# Patient Record
Sex: Female | Born: 1945
Health system: Southern US, Community
[De-identification: ages and names within clinical notes are randomized; demographics above are authoritative.]

## PROBLEM LIST (undated history)

## (undated) DIAGNOSIS — I2510 Atherosclerotic heart disease of native coronary artery without angina pectoris: Secondary | ICD-10-CM

## (undated) DIAGNOSIS — C50919 Malignant neoplasm of unspecified site of unspecified female breast: Secondary | ICD-10-CM

## (undated) DIAGNOSIS — I4510 Unspecified right bundle-branch block: Secondary | ICD-10-CM

## (undated) DIAGNOSIS — F32A Depression, unspecified: Secondary | ICD-10-CM

## (undated) DIAGNOSIS — Z8673 Personal history of transient ischemic attack (TIA), and cerebral infarction without residual deficits: Secondary | ICD-10-CM

## (undated) DIAGNOSIS — I452 Bifascicular block: Secondary | ICD-10-CM

## (undated) DIAGNOSIS — M1990 Unspecified osteoarthritis, unspecified site: Secondary | ICD-10-CM

## (undated) DIAGNOSIS — Z86010 Personal history of colonic polyps: Secondary | ICD-10-CM

## (undated) DIAGNOSIS — C50412 Malignant neoplasm of upper-outer quadrant of left female breast: Secondary | ICD-10-CM

## (undated) DIAGNOSIS — Z950 Presence of cardiac pacemaker: Secondary | ICD-10-CM

## (undated) DIAGNOSIS — F329 Major depressive disorder, single episode, unspecified: Secondary | ICD-10-CM

## (undated) DIAGNOSIS — E669 Obesity, unspecified: Secondary | ICD-10-CM

## (undated) DIAGNOSIS — E119 Type 2 diabetes mellitus without complications: Secondary | ICD-10-CM

## (undated) DIAGNOSIS — N3281 Overactive bladder: Secondary | ICD-10-CM

## (undated) DIAGNOSIS — K219 Gastro-esophageal reflux disease without esophagitis: Secondary | ICD-10-CM

## (undated) DIAGNOSIS — D649 Anemia, unspecified: Secondary | ICD-10-CM

## (undated) DIAGNOSIS — E875 Hyperkalemia: Secondary | ICD-10-CM

## (undated) DIAGNOSIS — Z5189 Encounter for other specified aftercare: Secondary | ICD-10-CM

## (undated) DIAGNOSIS — E1365 Other specified diabetes mellitus with hyperglycemia: Secondary | ICD-10-CM

## (undated) DIAGNOSIS — F419 Anxiety disorder, unspecified: Secondary | ICD-10-CM

## (undated) DIAGNOSIS — E1140 Type 2 diabetes mellitus with diabetic neuropathy, unspecified: Secondary | ICD-10-CM

## (undated) DIAGNOSIS — I639 Cerebral infarction, unspecified: Secondary | ICD-10-CM

## (undated) DIAGNOSIS — I10 Essential (primary) hypertension: Secondary | ICD-10-CM

## (undated) DIAGNOSIS — I453 Trifascicular block: Secondary | ICD-10-CM

## (undated) DIAGNOSIS — N200 Calculus of kidney: Secondary | ICD-10-CM

## (undated) DIAGNOSIS — T7840XA Allergy, unspecified, initial encounter: Secondary | ICD-10-CM

## (undated) DIAGNOSIS — E785 Hyperlipidemia, unspecified: Secondary | ICD-10-CM

## (undated) DIAGNOSIS — R55 Syncope and collapse: Secondary | ICD-10-CM

## (undated) HISTORY — DX: Anemia, unspecified: D64.9

## (undated) HISTORY — DX: Gastro-esophageal reflux disease without esophagitis: K21.9

## (undated) HISTORY — PX: BREAST SURGERY: SHX581

## (undated) HISTORY — DX: Syncope and collapse: R55

## (undated) HISTORY — DX: Allergy, unspecified, initial encounter: T78.40XA

## (undated) HISTORY — DX: Essential (primary) hypertension: I10

## (undated) HISTORY — PX: APPENDECTOMY: SHX54

## (undated) HISTORY — DX: Encounter for other specified aftercare: Z51.89

## (undated) HISTORY — DX: Depression, unspecified: F32.A

## (undated) HISTORY — DX: Anxiety disorder, unspecified: F41.9

## (undated) HISTORY — PX: COLONOSCOPY: SHX174

## (undated) HISTORY — PX: POLYPECTOMY: SHX149

## (undated) HISTORY — DX: Atherosclerotic heart disease of native coronary artery without angina pectoris: I25.10

## (undated) HISTORY — PX: BACK SURGERY: SHX140

## (undated) HISTORY — PX: LITHOTRIPSY: SUR834

## (undated) HISTORY — DX: Hyperkalemia: E87.5

## (undated) HISTORY — DX: Major depressive disorder, single episode, unspecified: F32.9

## (undated) HISTORY — DX: Obesity, unspecified: E66.9

## (undated) HISTORY — DX: Overactive bladder: N32.81

## (undated) HISTORY — DX: Other specified diabetes mellitus with hyperglycemia: E13.65

## (undated) HISTORY — DX: Type 2 diabetes mellitus without complications: E11.9

## (undated) HISTORY — DX: Type 2 diabetes mellitus with diabetic neuropathy, unspecified: E11.40

## (undated) HISTORY — DX: Malignant neoplasm of upper-outer quadrant of left female breast: C50.412

## (undated) HISTORY — PX: CHOLECYSTECTOMY, LAPAROSCOPIC: SHX56

## (undated) HISTORY — PX: REPLACEMENT TOTAL KNEE: SUR1224

## (undated) HISTORY — DX: Personal history of transient ischemic attack (TIA), and cerebral infarction without residual deficits: Z86.73

## (undated) HISTORY — DX: Unspecified osteoarthritis, unspecified site: M19.90

## (undated) HISTORY — PX: PARTIAL HIP ARTHROPLASTY: SHX733

## (undated) HISTORY — PX: OOPHORECTOMY: SHX86

## (undated) HISTORY — PX: JOINT REPLACEMENT: SHX530

## (undated) HISTORY — DX: Presence of cardiac pacemaker: Z95.0

## (undated) HISTORY — DX: Hyperlipidemia, unspecified: E78.5

## (undated) HISTORY — DX: Personal history of colonic polyps: Z86.010

## (undated) HISTORY — PX: TONSILLECTOMY: SUR1361

## (undated) HISTORY — PX: CHOLECYSTECTOMY: SHX55

## (undated) HISTORY — DX: Trifascicular block: I45.3

## (undated) HISTORY — DX: Unspecified right bundle-branch block: I45.10

---

## 1997-09-24 ENCOUNTER — Ambulatory Visit (HOSPITAL_BASED_OUTPATIENT_CLINIC_OR_DEPARTMENT_OTHER): Admission: RE | Admit: 1997-09-24 | Discharge: 1997-09-24 | Payer: Self-pay | Admitting: General Surgery

## 1997-11-28 ENCOUNTER — Other Ambulatory Visit: Admission: RE | Admit: 1997-11-28 | Discharge: 1997-11-28 | Payer: Self-pay | Admitting: Obstetrics & Gynecology

## 1998-02-07 ENCOUNTER — Inpatient Hospital Stay (HOSPITAL_COMMUNITY): Admission: EM | Admit: 1998-02-07 | Discharge: 1998-02-08 | Payer: Self-pay | Admitting: Emergency Medicine

## 1998-08-24 ENCOUNTER — Encounter: Payer: Self-pay | Admitting: Emergency Medicine

## 1998-08-24 ENCOUNTER — Emergency Department (HOSPITAL_COMMUNITY): Admission: EM | Admit: 1998-08-24 | Discharge: 1998-08-24 | Payer: Self-pay | Admitting: Emergency Medicine

## 1998-09-10 ENCOUNTER — Encounter: Payer: Self-pay | Admitting: Orthopedic Surgery

## 1998-09-17 ENCOUNTER — Inpatient Hospital Stay (HOSPITAL_COMMUNITY): Admission: RE | Admit: 1998-09-17 | Discharge: 1998-09-23 | Payer: Self-pay | Admitting: Orthopedic Surgery

## 1999-02-10 ENCOUNTER — Inpatient Hospital Stay (HOSPITAL_COMMUNITY): Admission: RE | Admit: 1999-02-10 | Discharge: 1999-02-16 | Payer: Self-pay | Admitting: Orthopedic Surgery

## 1999-05-14 ENCOUNTER — Emergency Department (HOSPITAL_COMMUNITY): Admission: EM | Admit: 1999-05-14 | Discharge: 1999-05-15 | Payer: Self-pay | Admitting: Internal Medicine

## 1999-09-26 ENCOUNTER — Emergency Department (HOSPITAL_COMMUNITY): Admission: EM | Admit: 1999-09-26 | Discharge: 1999-09-26 | Payer: Self-pay | Admitting: Emergency Medicine

## 2000-01-02 ENCOUNTER — Emergency Department (HOSPITAL_COMMUNITY): Admission: EM | Admit: 2000-01-02 | Discharge: 2000-01-02 | Payer: Self-pay | Admitting: Emergency Medicine

## 2000-03-04 ENCOUNTER — Inpatient Hospital Stay (HOSPITAL_COMMUNITY): Admission: EM | Admit: 2000-03-04 | Discharge: 2000-03-06 | Payer: Self-pay | Admitting: Emergency Medicine

## 2000-03-04 ENCOUNTER — Encounter: Payer: Self-pay | Admitting: Emergency Medicine

## 2000-04-17 ENCOUNTER — Inpatient Hospital Stay (HOSPITAL_COMMUNITY): Admission: EM | Admit: 2000-04-17 | Discharge: 2000-04-18 | Payer: Self-pay

## 2000-09-08 ENCOUNTER — Emergency Department (HOSPITAL_COMMUNITY): Admission: EM | Admit: 2000-09-08 | Discharge: 2000-09-08 | Payer: Self-pay | Admitting: Emergency Medicine

## 2000-11-14 ENCOUNTER — Emergency Department (HOSPITAL_COMMUNITY): Admission: EM | Admit: 2000-11-14 | Discharge: 2000-11-14 | Payer: Self-pay | Admitting: Emergency Medicine

## 2000-11-26 ENCOUNTER — Emergency Department (HOSPITAL_COMMUNITY): Admission: EM | Admit: 2000-11-26 | Discharge: 2000-11-26 | Payer: Self-pay | Admitting: *Deleted

## 2000-12-01 ENCOUNTER — Inpatient Hospital Stay (HOSPITAL_COMMUNITY): Admission: EM | Admit: 2000-12-01 | Discharge: 2000-12-04 | Payer: Self-pay | Admitting: Internal Medicine

## 2000-12-01 ENCOUNTER — Encounter: Payer: Self-pay | Admitting: Internal Medicine

## 2000-12-02 ENCOUNTER — Encounter: Payer: Self-pay | Admitting: Pulmonary Disease

## 2001-06-14 ENCOUNTER — Ambulatory Visit (HOSPITAL_COMMUNITY): Admission: RE | Admit: 2001-06-14 | Discharge: 2001-06-14 | Payer: Self-pay | Admitting: *Deleted

## 2001-06-14 ENCOUNTER — Encounter: Payer: Self-pay | Admitting: *Deleted

## 2001-12-13 ENCOUNTER — Encounter: Admission: RE | Admit: 2001-12-13 | Discharge: 2001-12-13 | Payer: Self-pay | Admitting: Urology

## 2001-12-13 ENCOUNTER — Encounter: Payer: Self-pay | Admitting: Urology

## 2001-12-20 ENCOUNTER — Ambulatory Visit (HOSPITAL_BASED_OUTPATIENT_CLINIC_OR_DEPARTMENT_OTHER): Admission: RE | Admit: 2001-12-20 | Discharge: 2001-12-20 | Payer: Self-pay | Admitting: Urology

## 2001-12-20 ENCOUNTER — Encounter (INDEPENDENT_AMBULATORY_CARE_PROVIDER_SITE_OTHER): Payer: Self-pay | Admitting: Specialist

## 2002-04-22 ENCOUNTER — Encounter: Payer: Self-pay | Admitting: Urology

## 2002-04-22 ENCOUNTER — Ambulatory Visit (HOSPITAL_BASED_OUTPATIENT_CLINIC_OR_DEPARTMENT_OTHER): Admission: RE | Admit: 2002-04-22 | Discharge: 2002-04-22 | Payer: Self-pay | Admitting: Urology

## 2002-09-26 ENCOUNTER — Ambulatory Visit (HOSPITAL_BASED_OUTPATIENT_CLINIC_OR_DEPARTMENT_OTHER): Admission: RE | Admit: 2002-09-26 | Discharge: 2002-09-26 | Payer: Self-pay | Admitting: Urology

## 2002-09-26 ENCOUNTER — Encounter: Payer: Self-pay | Admitting: Urology

## 2002-12-30 ENCOUNTER — Ambulatory Visit (HOSPITAL_COMMUNITY): Admission: RE | Admit: 2002-12-30 | Discharge: 2002-12-30 | Payer: Self-pay | Admitting: Family Medicine

## 2003-03-14 ENCOUNTER — Encounter (INDEPENDENT_AMBULATORY_CARE_PROVIDER_SITE_OTHER): Payer: Self-pay | Admitting: Family Medicine

## 2003-08-09 ENCOUNTER — Emergency Department (HOSPITAL_COMMUNITY): Admission: EM | Admit: 2003-08-09 | Discharge: 2003-08-09 | Payer: Self-pay | Admitting: Emergency Medicine

## 2003-10-02 ENCOUNTER — Inpatient Hospital Stay (HOSPITAL_COMMUNITY): Admission: EM | Admit: 2003-10-02 | Discharge: 2003-10-05 | Payer: Self-pay | Admitting: Emergency Medicine

## 2003-10-06 ENCOUNTER — Ambulatory Visit (HOSPITAL_COMMUNITY): Admission: RE | Admit: 2003-10-06 | Discharge: 2003-10-06 | Payer: Self-pay | Admitting: General Surgery

## 2003-10-06 ENCOUNTER — Encounter (INDEPENDENT_AMBULATORY_CARE_PROVIDER_SITE_OTHER): Payer: Self-pay | Admitting: Specialist

## 2003-10-06 ENCOUNTER — Ambulatory Visit (HOSPITAL_BASED_OUTPATIENT_CLINIC_OR_DEPARTMENT_OTHER): Admission: RE | Admit: 2003-10-06 | Discharge: 2003-10-06 | Payer: Self-pay | Admitting: General Surgery

## 2004-02-18 ENCOUNTER — Ambulatory Visit: Payer: Self-pay | Admitting: Family Medicine

## 2004-02-25 ENCOUNTER — Emergency Department (HOSPITAL_COMMUNITY): Admission: EM | Admit: 2004-02-25 | Discharge: 2004-02-26 | Payer: Self-pay | Admitting: Emergency Medicine

## 2004-04-16 ENCOUNTER — Ambulatory Visit: Payer: Self-pay | Admitting: *Deleted

## 2004-06-21 ENCOUNTER — Ambulatory Visit: Payer: Self-pay | Admitting: Family Medicine

## 2004-06-29 ENCOUNTER — Ambulatory Visit: Payer: Self-pay | Admitting: Family Medicine

## 2004-08-03 ENCOUNTER — Ambulatory Visit: Payer: Self-pay | Admitting: Family Medicine

## 2004-08-17 ENCOUNTER — Inpatient Hospital Stay (HOSPITAL_COMMUNITY): Admission: EM | Admit: 2004-08-17 | Discharge: 2004-08-20 | Payer: Self-pay | Admitting: Emergency Medicine

## 2004-08-18 ENCOUNTER — Ambulatory Visit: Payer: Self-pay | Admitting: *Deleted

## 2004-09-02 ENCOUNTER — Emergency Department (HOSPITAL_COMMUNITY): Admission: EM | Admit: 2004-09-02 | Discharge: 2004-09-02 | Payer: Self-pay | Admitting: Emergency Medicine

## 2004-09-03 ENCOUNTER — Ambulatory Visit: Payer: Self-pay | Admitting: Cardiology

## 2004-10-01 ENCOUNTER — Ambulatory Visit: Payer: Self-pay | Admitting: Cardiology

## 2004-10-19 ENCOUNTER — Emergency Department (HOSPITAL_COMMUNITY): Admission: EM | Admit: 2004-10-19 | Discharge: 2004-10-19 | Payer: Self-pay | Admitting: Emergency Medicine

## 2004-11-01 ENCOUNTER — Ambulatory Visit: Payer: Self-pay | Admitting: Internal Medicine

## 2004-12-09 ENCOUNTER — Emergency Department (HOSPITAL_COMMUNITY): Admission: EM | Admit: 2004-12-09 | Discharge: 2004-12-10 | Payer: Self-pay | Admitting: Emergency Medicine

## 2004-12-13 ENCOUNTER — Ambulatory Visit: Payer: Self-pay | Admitting: Family Medicine

## 2004-12-28 ENCOUNTER — Ambulatory Visit: Payer: Self-pay | Admitting: Family Medicine

## 2004-12-30 ENCOUNTER — Emergency Department (HOSPITAL_COMMUNITY): Admission: EM | Admit: 2004-12-30 | Discharge: 2004-12-30 | Payer: Self-pay | Admitting: Emergency Medicine

## 2005-01-07 ENCOUNTER — Ambulatory Visit (HOSPITAL_COMMUNITY): Admission: RE | Admit: 2005-01-07 | Discharge: 2005-01-07 | Payer: Self-pay | Admitting: Family Medicine

## 2005-01-12 ENCOUNTER — Ambulatory Visit: Payer: Self-pay | Admitting: Internal Medicine

## 2005-01-27 ENCOUNTER — Encounter: Admission: RE | Admit: 2005-01-27 | Discharge: 2005-03-06 | Payer: Self-pay | Admitting: Family Medicine

## 2005-03-07 ENCOUNTER — Encounter: Admission: RE | Admit: 2005-03-07 | Discharge: 2005-04-12 | Payer: Self-pay | Admitting: Family Medicine

## 2005-04-13 ENCOUNTER — Encounter: Admission: RE | Admit: 2005-04-13 | Discharge: 2005-07-12 | Payer: Self-pay | Admitting: Family Medicine

## 2005-04-15 ENCOUNTER — Ambulatory Visit: Payer: Self-pay | Admitting: Cardiology

## 2005-05-13 ENCOUNTER — Encounter (INDEPENDENT_AMBULATORY_CARE_PROVIDER_SITE_OTHER): Payer: Self-pay | Admitting: Family Medicine

## 2005-05-20 ENCOUNTER — Emergency Department (HOSPITAL_COMMUNITY): Admission: EM | Admit: 2005-05-20 | Discharge: 2005-05-20 | Payer: Self-pay | Admitting: Emergency Medicine

## 2005-05-21 ENCOUNTER — Inpatient Hospital Stay (HOSPITAL_COMMUNITY): Admission: EM | Admit: 2005-05-21 | Discharge: 2005-05-25 | Payer: Self-pay | Admitting: Emergency Medicine

## 2005-05-21 ENCOUNTER — Ambulatory Visit: Payer: Self-pay | Admitting: Hospitalist

## 2005-06-02 ENCOUNTER — Ambulatory Visit: Payer: Self-pay | Admitting: Family Medicine

## 2005-06-03 ENCOUNTER — Ambulatory Visit: Payer: Self-pay | Admitting: Internal Medicine

## 2005-06-09 ENCOUNTER — Encounter: Payer: Self-pay | Admitting: Family Medicine

## 2005-06-09 ENCOUNTER — Encounter: Admission: RE | Admit: 2005-06-09 | Discharge: 2005-06-09 | Payer: Self-pay | Admitting: Family Medicine

## 2005-06-23 ENCOUNTER — Ambulatory Visit (HOSPITAL_COMMUNITY): Admission: RE | Admit: 2005-06-23 | Discharge: 2005-06-23 | Payer: Self-pay | Admitting: Urology

## 2005-07-07 ENCOUNTER — Ambulatory Visit: Payer: Self-pay | Admitting: Family Medicine

## 2005-07-11 ENCOUNTER — Ambulatory Visit: Payer: Self-pay | Admitting: Family Medicine

## 2005-09-02 ENCOUNTER — Emergency Department (HOSPITAL_COMMUNITY): Admission: EM | Admit: 2005-09-02 | Discharge: 2005-09-02 | Payer: Self-pay | Admitting: Emergency Medicine

## 2005-11-03 ENCOUNTER — Ambulatory Visit: Payer: Self-pay | Admitting: Cardiology

## 2005-11-29 ENCOUNTER — Inpatient Hospital Stay (HOSPITAL_COMMUNITY): Admission: EM | Admit: 2005-11-29 | Discharge: 2005-11-30 | Payer: Self-pay | Admitting: Emergency Medicine

## 2005-11-29 ENCOUNTER — Ambulatory Visit: Payer: Self-pay | Admitting: Cardiology

## 2005-11-30 ENCOUNTER — Encounter: Payer: Self-pay | Admitting: Cardiovascular Disease

## 2005-12-01 ENCOUNTER — Emergency Department (HOSPITAL_COMMUNITY): Admission: EM | Admit: 2005-12-01 | Discharge: 2005-12-02 | Payer: Self-pay | Admitting: *Deleted

## 2006-02-28 ENCOUNTER — Ambulatory Visit (HOSPITAL_COMMUNITY): Admission: RE | Admit: 2006-02-28 | Discharge: 2006-02-28 | Payer: Self-pay | Admitting: Family Medicine

## 2006-03-27 ENCOUNTER — Ambulatory Visit: Payer: Self-pay | Admitting: Family Medicine

## 2006-05-15 ENCOUNTER — Other Ambulatory Visit: Admission: RE | Admit: 2006-05-15 | Discharge: 2006-05-15 | Payer: Self-pay | Admitting: Family Medicine

## 2006-05-15 ENCOUNTER — Encounter (INDEPENDENT_AMBULATORY_CARE_PROVIDER_SITE_OTHER): Payer: Self-pay | Admitting: Family Medicine

## 2006-05-15 ENCOUNTER — Ambulatory Visit: Payer: Self-pay | Admitting: Family Medicine

## 2006-06-07 ENCOUNTER — Ambulatory Visit: Payer: Self-pay | Admitting: Cardiology

## 2006-06-12 ENCOUNTER — Ambulatory Visit (HOSPITAL_COMMUNITY): Admission: RE | Admit: 2006-06-12 | Discharge: 2006-06-12 | Payer: Self-pay | Admitting: Family Medicine

## 2006-06-14 ENCOUNTER — Ambulatory Visit: Payer: Self-pay

## 2006-06-20 ENCOUNTER — Ambulatory Visit: Payer: Self-pay | Admitting: Cardiology

## 2006-06-20 ENCOUNTER — Observation Stay (HOSPITAL_COMMUNITY): Admission: EM | Admit: 2006-06-20 | Discharge: 2006-06-21 | Payer: Self-pay | Admitting: Emergency Medicine

## 2006-08-09 ENCOUNTER — Ambulatory Visit: Payer: Self-pay | Admitting: Family Medicine

## 2006-09-06 ENCOUNTER — Ambulatory Visit: Payer: Self-pay | Admitting: Family Medicine

## 2006-10-04 ENCOUNTER — Ambulatory Visit: Payer: Self-pay | Admitting: Family Medicine

## 2006-12-02 ENCOUNTER — Ambulatory Visit: Payer: Self-pay | Admitting: *Deleted

## 2006-12-02 ENCOUNTER — Inpatient Hospital Stay (HOSPITAL_COMMUNITY): Admission: EM | Admit: 2006-12-02 | Discharge: 2006-12-05 | Payer: Self-pay | Admitting: Emergency Medicine

## 2006-12-27 ENCOUNTER — Ambulatory Visit: Payer: Self-pay | Admitting: Family Medicine

## 2006-12-29 ENCOUNTER — Ambulatory Visit: Payer: Self-pay | Admitting: Family Medicine

## 2007-01-01 ENCOUNTER — Ambulatory Visit: Payer: Self-pay | Admitting: Family Medicine

## 2007-02-15 ENCOUNTER — Emergency Department (HOSPITAL_COMMUNITY): Admission: EM | Admit: 2007-02-15 | Discharge: 2007-02-16 | Payer: Self-pay | Admitting: Emergency Medicine

## 2007-02-22 ENCOUNTER — Encounter (INDEPENDENT_AMBULATORY_CARE_PROVIDER_SITE_OTHER): Payer: Self-pay | Admitting: Family Medicine

## 2007-02-22 DIAGNOSIS — E1149 Type 2 diabetes mellitus with other diabetic neurological complication: Secondary | ICD-10-CM

## 2007-02-22 DIAGNOSIS — E1165 Type 2 diabetes mellitus with hyperglycemia: Secondary | ICD-10-CM

## 2007-02-22 DIAGNOSIS — E785 Hyperlipidemia, unspecified: Secondary | ICD-10-CM

## 2007-09-26 ENCOUNTER — Emergency Department (HOSPITAL_COMMUNITY): Admission: EM | Admit: 2007-09-26 | Discharge: 2007-09-26 | Payer: Self-pay | Admitting: Emergency Medicine

## 2007-10-24 ENCOUNTER — Ambulatory Visit: Payer: Self-pay | Admitting: Cardiology

## 2007-11-01 ENCOUNTER — Ambulatory Visit: Payer: Self-pay | Admitting: Cardiology

## 2007-11-01 ENCOUNTER — Inpatient Hospital Stay (HOSPITAL_COMMUNITY): Admission: RE | Admit: 2007-11-01 | Discharge: 2007-11-08 | Payer: Self-pay | Admitting: Orthopedic Surgery

## 2007-11-02 ENCOUNTER — Encounter (INDEPENDENT_AMBULATORY_CARE_PROVIDER_SITE_OTHER): Payer: Self-pay | Admitting: Family Medicine

## 2007-11-16 ENCOUNTER — Ambulatory Visit (HOSPITAL_COMMUNITY): Admission: RE | Admit: 2007-11-16 | Discharge: 2007-11-16 | Payer: Self-pay | Admitting: Orthopedic Surgery

## 2007-11-16 ENCOUNTER — Encounter (INDEPENDENT_AMBULATORY_CARE_PROVIDER_SITE_OTHER): Payer: Self-pay | Admitting: Orthopedic Surgery

## 2007-11-16 ENCOUNTER — Ambulatory Visit: Payer: Self-pay | Admitting: Vascular Surgery

## 2008-08-08 ENCOUNTER — Telehealth (INDEPENDENT_AMBULATORY_CARE_PROVIDER_SITE_OTHER): Payer: Self-pay | Admitting: *Deleted

## 2008-08-12 ENCOUNTER — Encounter (INDEPENDENT_AMBULATORY_CARE_PROVIDER_SITE_OTHER): Payer: Self-pay | Admitting: Family Medicine

## 2008-09-06 ENCOUNTER — Emergency Department (HOSPITAL_COMMUNITY): Admission: EM | Admit: 2008-09-06 | Discharge: 2008-09-06 | Payer: Self-pay | Admitting: Emergency Medicine

## 2008-12-22 ENCOUNTER — Encounter: Admission: RE | Admit: 2008-12-22 | Discharge: 2008-12-22 | Payer: Self-pay | Admitting: Internal Medicine

## 2009-01-02 ENCOUNTER — Observation Stay (HOSPITAL_COMMUNITY): Admission: EM | Admit: 2009-01-02 | Discharge: 2009-01-02 | Payer: Self-pay | Admitting: Emergency Medicine

## 2009-01-11 HISTORY — PX: CARDIAC CATHETERIZATION: SHX172

## 2009-02-02 ENCOUNTER — Emergency Department (HOSPITAL_COMMUNITY): Admission: EM | Admit: 2009-02-02 | Discharge: 2009-02-02 | Payer: Self-pay | Admitting: Emergency Medicine

## 2009-02-02 ENCOUNTER — Ambulatory Visit: Payer: Self-pay | Admitting: Cardiology

## 2009-02-02 ENCOUNTER — Inpatient Hospital Stay (HOSPITAL_COMMUNITY): Admission: EM | Admit: 2009-02-02 | Discharge: 2009-02-06 | Payer: Self-pay | Admitting: Emergency Medicine

## 2009-02-15 DIAGNOSIS — K219 Gastro-esophageal reflux disease without esophagitis: Secondary | ICD-10-CM | POA: Insufficient documentation

## 2009-02-15 DIAGNOSIS — I2510 Atherosclerotic heart disease of native coronary artery without angina pectoris: Secondary | ICD-10-CM

## 2009-02-15 DIAGNOSIS — F341 Dysthymic disorder: Secondary | ICD-10-CM

## 2009-02-15 DIAGNOSIS — R079 Chest pain, unspecified: Secondary | ICD-10-CM | POA: Insufficient documentation

## 2009-02-15 DIAGNOSIS — I10 Essential (primary) hypertension: Secondary | ICD-10-CM

## 2009-02-15 DIAGNOSIS — M1990 Unspecified osteoarthritis, unspecified site: Secondary | ICD-10-CM

## 2009-02-15 DIAGNOSIS — D649 Anemia, unspecified: Secondary | ICD-10-CM

## 2009-02-15 DIAGNOSIS — E669 Obesity, unspecified: Secondary | ICD-10-CM

## 2009-02-15 HISTORY — DX: Atherosclerotic heart disease of native coronary artery without angina pectoris: I25.10

## 2009-02-15 HISTORY — DX: Essential (primary) hypertension: I10

## 2009-02-15 HISTORY — DX: Anemia, unspecified: D64.9

## 2009-02-20 ENCOUNTER — Ambulatory Visit: Payer: Self-pay | Admitting: Cardiology

## 2009-02-20 DIAGNOSIS — I4510 Unspecified right bundle-branch block: Secondary | ICD-10-CM

## 2009-08-12 ENCOUNTER — Inpatient Hospital Stay (HOSPITAL_COMMUNITY): Admission: EM | Admit: 2009-08-12 | Discharge: 2009-08-13 | Payer: Self-pay | Admitting: Emergency Medicine

## 2009-08-12 ENCOUNTER — Encounter (INDEPENDENT_AMBULATORY_CARE_PROVIDER_SITE_OTHER): Payer: Self-pay | Admitting: Internal Medicine

## 2009-08-12 ENCOUNTER — Ambulatory Visit: Payer: Self-pay | Admitting: Internal Medicine

## 2009-08-26 ENCOUNTER — Ambulatory Visit (HOSPITAL_COMMUNITY): Payer: Self-pay | Admitting: Licensed Clinical Social Worker

## 2009-09-09 ENCOUNTER — Ambulatory Visit (HOSPITAL_COMMUNITY): Payer: Self-pay | Admitting: Licensed Clinical Social Worker

## 2009-09-12 ENCOUNTER — Observation Stay (HOSPITAL_COMMUNITY): Admission: EM | Admit: 2009-09-12 | Discharge: 2009-09-12 | Payer: Self-pay | Admitting: Emergency Medicine

## 2009-09-24 ENCOUNTER — Ambulatory Visit (HOSPITAL_COMMUNITY): Payer: Self-pay | Admitting: Licensed Clinical Social Worker

## 2009-09-28 ENCOUNTER — Encounter: Admission: RE | Admit: 2009-09-28 | Discharge: 2009-09-28 | Payer: Self-pay | Admitting: Orthopedic Surgery

## 2009-10-21 ENCOUNTER — Encounter: Payer: Self-pay | Admitting: Cardiology

## 2009-10-21 ENCOUNTER — Encounter: Admission: RE | Admit: 2009-10-21 | Discharge: 2009-10-21 | Payer: Self-pay | Admitting: Specialist

## 2009-11-10 ENCOUNTER — Inpatient Hospital Stay (HOSPITAL_COMMUNITY): Admission: RE | Admit: 2009-11-10 | Discharge: 2009-11-16 | Payer: Self-pay | Admitting: Specialist

## 2009-11-13 ENCOUNTER — Ambulatory Visit: Payer: Self-pay | Admitting: Physical Medicine & Rehabilitation

## 2010-07-04 ENCOUNTER — Encounter: Payer: Self-pay | Admitting: Family Medicine

## 2010-07-13 NOTE — Consult Note (Signed)
Summary: [facility] Pre Op Consultation  [facility] Pre Op Consultation   Imported By: Roderic Ovens 11/25/2009 11:21:59  _____________________________________________________________________  External Attachment:    Type:   Image     Comment:   External Document

## 2010-08-09 ENCOUNTER — Encounter (INDEPENDENT_AMBULATORY_CARE_PROVIDER_SITE_OTHER): Payer: Self-pay | Admitting: *Deleted

## 2010-08-09 ENCOUNTER — Other Ambulatory Visit: Payer: Self-pay | Admitting: Family Medicine

## 2010-08-09 DIAGNOSIS — Z780 Asymptomatic menopausal state: Secondary | ICD-10-CM

## 2010-08-09 DIAGNOSIS — Z1231 Encounter for screening mammogram for malignant neoplasm of breast: Secondary | ICD-10-CM

## 2010-08-16 LAB — HM DIABETES EYE EXAM

## 2010-08-19 NOTE — Letter (Signed)
Summary: Pre Visit Letter Revised  [facility]  [address] [place], [place] [address]   Phone: [phone]  Fax: [phone]        08/09/2010 MRN: [id number] Kristen [name] [address] [place], [place]  [address]             Procedure Date:  09-13-10           Direct Colon--Dr. Leone Payor   Welcome to the Gastroenterology Division at [facility].    You are scheduled to see a nurse for your pre-procedure visit on 08-30-10 at 10:30a.m. on the 3rd floor at [facility], [address].  We ask that you try to arrive at our office 15 minutes prior to your appointment time to allow for check-in.  Please take a minute to review the attached form.  If you answer "Yes" to one or more of the questions on the first page, we ask that you call the person listed at your earliest opportunity.  If you answer "No" to all of the questions, please complete the rest of the form and bring it to your appointment.    Your nurse visit will consist of discussing your medical and surgical history, your immediate family medical history, and your medications.   If you are unable to list all of your medications on the form, please bring the medication bottles to your appointment and we will list them.  We will need to be aware of both prescribed and over the counter drugs.  We will need to know exact dosage information as well.    Please be prepared to read and sign documents such as consent forms, a financial agreement, and acknowledgement forms.  If necessary, and with your consent, a friend or relative is welcome to sit-in on the nurse visit with you.  Please bring your insurance card so that we may make a copy of it.  If your insurance requires a referral to see a specialist, please bring your referral form from your primary care physician.  No co-pay is required for this nurse visit.     If you cannot keep your appointment, please call [phone] to cancel or reschedule  prior to your appointment date.  This allows [place] the opportunity to schedule an appointment for another patient in need of care.    Thank you for choosing [facility] for your medical needs.  We appreciate the opportunity to care for you.  Please visit [place] at our website  to learn more about our practice.  Sincerely, The Gastroenterology Division

## 2010-08-20 ENCOUNTER — Other Ambulatory Visit: Payer: Self-pay

## 2010-08-20 ENCOUNTER — Ambulatory Visit: Payer: Self-pay

## 2010-08-30 ENCOUNTER — Encounter: Payer: Self-pay | Admitting: Internal Medicine

## 2010-08-30 LAB — DIFFERENTIAL
Basophils Absolute: 0 10*3/uL (ref 0.0–0.1)
Basophils Relative: 0 % (ref 0–1)
Eosinophils Absolute: 0.2 10*3/uL (ref 0.0–0.7)
Eosinophils Relative: 2 % (ref 0–5)
Monocytes Absolute: 1 10*3/uL (ref 0.1–1.0)
Neutro Abs: 4.1 10*3/uL (ref 1.7–7.7)

## 2010-08-30 LAB — GLUCOSE, CAPILLARY
Glucose-Capillary: 103 mg/dL — ABNORMAL HIGH (ref 70–99)
Glucose-Capillary: 115 mg/dL — ABNORMAL HIGH (ref 70–99)
Glucose-Capillary: 116 mg/dL — ABNORMAL HIGH (ref 70–99)
Glucose-Capillary: 120 mg/dL — ABNORMAL HIGH (ref 70–99)
Glucose-Capillary: 132 mg/dL — ABNORMAL HIGH (ref 70–99)
Glucose-Capillary: 144 mg/dL — ABNORMAL HIGH (ref 70–99)
Glucose-Capillary: 150 mg/dL — ABNORMAL HIGH (ref 70–99)
Glucose-Capillary: 171 mg/dL — ABNORMAL HIGH (ref 70–99)
Glucose-Capillary: 77 mg/dL (ref 70–99)
Glucose-Capillary: 79 mg/dL (ref 70–99)
Glucose-Capillary: 91 mg/dL (ref 70–99)
Glucose-Capillary: 135 mg/dL — ABNORMAL HIGH (ref 70–99)
Glucose-Capillary: 137 mg/dL — ABNORMAL HIGH (ref 70–99)

## 2010-08-30 LAB — POCT I-STAT 4, (NA,K, GLUC, HGB,HCT)
Glucose, Bld: 177 mg/dL — ABNORMAL HIGH (ref 70–99)
HCT: 30 % — ABNORMAL LOW (ref 36.0–46.0)
Potassium: 4.9 mEq/L (ref 3.5–5.1)
Sodium: 139 mEq/L (ref 135–145)

## 2010-08-30 LAB — CBC
HCT: 36.4 % (ref 36.0–46.0)
Hemoglobin: 12.1 g/dL (ref 12.0–15.0)
Platelets: 239 10*3/uL (ref 150–400)
WBC: 8.1 10*3/uL (ref 4.0–10.5)

## 2010-08-30 LAB — TYPE AND SCREEN

## 2010-08-30 LAB — BASIC METABOLIC PANEL
BUN: 10 mg/dL (ref 6–23)
BUN: 22 mg/dL (ref 6–23)
Calcium: 7.3 mg/dL — ABNORMAL LOW (ref 8.4–10.5)
Creatinine, Ser: 0.85 mg/dL (ref 0.4–1.2)
GFR calc Af Amer: >60 mL/min (ref >60)
GFR calc non Af Amer: 34 mL/min — ABNORMAL LOW (ref >60)
GFR calc non Af Amer: >60 mL/min (ref >60)
Potassium: 4.7 mEq/L (ref 3.5–5.1)
Potassium: 5.1 mEq/L (ref 3.5–5.1)
Sodium: 137 mEq/L (ref 135–145)

## 2010-08-30 LAB — URINALYSIS, ROUTINE W REFLEX MICROSCOPIC
Bilirubin Urine: NEGATIVE
Glucose, UA: NEGATIVE mg/dL
Hgb urine dipstick: NEGATIVE
Ketones, ur: NEGATIVE mg/dL
Protein, ur: NEGATIVE mg/dL

## 2010-08-30 LAB — HEMOGLOBIN AND HEMATOCRIT, BLOOD
HCT: 29.6 % — ABNORMAL LOW (ref 36.0–46.0)
HCT: 30.7 % — ABNORMAL LOW (ref 36.0–46.0)
Hemoglobin: 10 g/dL — ABNORMAL LOW (ref 12.0–15.0)

## 2010-08-30 LAB — HEMOGLOBIN A1C

## 2010-08-30 LAB — COMPREHENSIVE METABOLIC PANEL
Albumin: 4 g/dL (ref 3.5–5.2)
Alkaline Phosphatase: 64 U/L (ref 39–117)
BUN: 15 mg/dL (ref 6–23)
Chloride: 102 mEq/L (ref 96–112)
Potassium: 5.1 mEq/L (ref 3.5–5.1)
Total Bilirubin: 0.4 mg/dL (ref 0.3–1.2)

## 2010-08-30 LAB — POCT I-STAT GLUCOSE: Glucose, Bld: 148 mg/dL — ABNORMAL HIGH (ref 70–99)

## 2010-09-01 ENCOUNTER — Ambulatory Visit
Admission: RE | Admit: 2010-09-01 | Discharge: 2010-09-01 | Disposition: A | Discharge status: Home / Self Care | Payer: Medicare Other | Source: Ambulatory Visit | Attending: Family Medicine | Admitting: Family Medicine

## 2010-09-01 DIAGNOSIS — Z780 Asymptomatic menopausal state: Secondary | ICD-10-CM

## 2010-09-01 DIAGNOSIS — Z1231 Encounter for screening mammogram for malignant neoplasm of breast: Secondary | ICD-10-CM

## 2010-09-01 LAB — POCT CARDIAC MARKERS
CKMB, poc: 1.2 ng/mL (ref 1.0–8.0)
CKMB, poc: <1 ng/mL — ABNORMAL LOW (ref 1.0–8.0)
CKMB, poc: <1 ng/mL — ABNORMAL LOW (ref 1.0–8.0)
Troponin i, poc: <0.05 ng/mL (ref 0.00–0.09)
Troponin i, poc: <0.05 ng/mL (ref 0.00–0.09)
Troponin i, poc: <0.05 ng/mL (ref 0.00–0.09)

## 2010-09-01 LAB — DIFFERENTIAL
Lymphocytes Relative: 35 % (ref 12–46)
Lymphs Abs: 2.4 10*3/uL (ref 0.7–4.0)
Monocytes Absolute: 0.7 10*3/uL (ref 0.1–1.0)
Monocytes Relative: 10 % (ref 3–12)
Neutro Abs: 3.5 10*3/uL (ref 1.7–7.7)
Neutrophils Relative %: 52 % (ref 43–77)

## 2010-09-01 LAB — POCT I-STAT, CHEM 8
BUN: 21 mg/dL (ref 6–23)
Calcium, Ion: 1.21 mmol/L (ref 1.12–1.32)
Chloride: 106 mEq/L (ref 96–112)
Glucose, Bld: 148 mg/dL — ABNORMAL HIGH (ref 70–99)
HCT: 37 % (ref 36.0–46.0)
Potassium: 5.1 mEq/L (ref 3.5–5.1)

## 2010-09-01 LAB — CBC
Hemoglobin: 11.6 g/dL — ABNORMAL LOW (ref 12.0–15.0)
RBC: 3.89 MIL/uL (ref 3.87–5.11)
WBC: 6.7 10*3/uL (ref 4.0–10.5)

## 2010-09-01 LAB — HM DEXA SCAN

## 2010-09-03 LAB — CBC
HCT: 35.6 % — ABNORMAL LOW (ref 36.0–46.0)
HCT: 37.1 % (ref 36.0–46.0)
MCHC: 32.1 g/dL (ref 30.0–36.0)
MCHC: 33.1 g/dL (ref 30.0–36.0)
MCV: 90.1 fL (ref 78.0–100.0)
MCV: 90.3 fL (ref 78.0–100.0)
MCV: 90.8 fL (ref 78.0–100.0)
Platelets: 196 10*3/uL (ref 150–400)
Platelets: 236 10*3/uL (ref 150–400)
RBC: 3.64 MIL/uL — ABNORMAL LOW (ref 3.87–5.11)
RDW: 14.9 % (ref 11.5–15.5)
RDW: 14.9 % (ref 11.5–15.5)

## 2010-09-03 LAB — URINE CULTURE: Colony Count: 2000

## 2010-09-03 LAB — URINALYSIS, ROUTINE W REFLEX MICROSCOPIC
Bilirubin Urine: NEGATIVE
Nitrite: NEGATIVE
Specific Gravity, Urine: 1.011 (ref 1.005–1.030)
Urobilinogen, UA: 0.2 mg/dL (ref 0.0–1.0)
pH: 6 (ref 5.0–8.0)

## 2010-09-03 LAB — COMPREHENSIVE METABOLIC PANEL
ALT: 25 U/L (ref 0–35)
Albumin: 3.5 g/dL (ref 3.5–5.2)
Alkaline Phosphatase: 71 U/L (ref 39–117)
BUN: 11 mg/dL (ref 6–23)
CO2: 28 mEq/L (ref 19–32)
Calcium: 9.5 mg/dL (ref 8.4–10.5)
Chloride: 104 mEq/L (ref 96–112)
Creatinine, Ser: 0.84 mg/dL (ref 0.4–1.2)
GFR calc Af Amer: >60 mL/min (ref >60)
GFR calc Af Amer: >60 mL/min (ref >60)
GFR calc non Af Amer: >60 mL/min (ref >60)
Glucose, Bld: 156 mg/dL — ABNORMAL HIGH (ref 70–99)
Total Protein: 7 g/dL (ref 6.0–8.3)

## 2010-09-03 LAB — CARDIAC PANEL(CRET KIN+CKTOT+MB+TROPI)
CK, MB: 1.2 ng/mL (ref 0.3–4.0)
CK, MB: 1.3 ng/mL (ref 0.3–4.0)
Relative Index: INVALID (ref 0.0–2.5)
Relative Index: INVALID (ref 0.0–2.5)
Total CK: 49 U/L (ref 7–177)
Troponin I: 0.01 ng/mL (ref 0.00–0.06)
Troponin I: 0.03 ng/mL (ref 0.00–0.06)

## 2010-09-03 LAB — D-DIMER, QUANTITATIVE: D-Dimer, Quant: <0.22 ug/mL-FEU (ref 0.00–0.48)

## 2010-09-03 LAB — GLUCOSE, CAPILLARY
Glucose-Capillary: 118 mg/dL — ABNORMAL HIGH (ref 70–99)
Glucose-Capillary: 130 mg/dL — ABNORMAL HIGH (ref 70–99)
Glucose-Capillary: 153 mg/dL — ABNORMAL HIGH (ref 70–99)

## 2010-09-03 LAB — DIFFERENTIAL
Basophils Relative: 0 % (ref 0–1)
Eosinophils Absolute: 0.2 10*3/uL (ref 0.0–0.7)
Eosinophils Relative: 2 % (ref 0–5)
Neutrophils Relative %: 59 % (ref 43–77)

## 2010-09-03 LAB — POTASSIUM: Potassium: 4.7 mEq/L (ref 3.5–5.1)

## 2010-09-03 LAB — TROPONIN I: Troponin I: 0.01 ng/mL (ref 0.00–0.06)

## 2010-09-03 LAB — BASIC METABOLIC PANEL
BUN: 16 mg/dL (ref 6–23)
CO2: 27 mEq/L (ref 19–32)
Chloride: 105 mEq/L (ref 96–112)
Creatinine, Ser: 1.06 mg/dL (ref 0.4–1.2)
GFR calc Af Amer: >60 mL/min (ref >60)
Glucose, Bld: 140 mg/dL — ABNORMAL HIGH (ref 70–99)

## 2010-09-03 LAB — POCT CARDIAC MARKERS
CKMB, poc: <1 ng/mL — ABNORMAL LOW (ref 1.0–8.0)
Troponin i, poc: <0.05 ng/mL (ref 0.00–0.09)

## 2010-09-03 LAB — TSH: TSH: 1.309 u[IU]/mL (ref 0.350–4.500)

## 2010-09-03 LAB — CK TOTAL AND CKMB (NOT AT ARMC)
CK, MB: 1.3 ng/mL (ref 0.3–4.0)
Relative Index: INVALID (ref 0.0–2.5)

## 2010-09-03 LAB — PROTIME-INR: Prothrombin Time: 13.9 seconds (ref 11.6–15.2)

## 2010-09-09 NOTE — Miscellaneous (Signed)
Summary: [facility]  Clinical Lists Changes  Medications: Added new medication of MOVIPREP 100 GM  SOLR (PEG-KCL-NACL-NASULF-NA ASC-C) As per prep instructions. - Signed Rx of MOVIPREP 100 GM  SOLR (PEG-KCL-NACL-NASULF-NA ASC-C) As per prep instructions.;  #1 x 0;  Signed;  Entered by: Doristine Church RN II;  Authorized by: Iva Boop MD, [facility];  Method used: Electronically to [facility] [phone]*, [address], [place], [place]  [address], Ph: [phone], Fax: [phone] Observations: Added new observation of ALLERGY REV: Done (08/30/2010 10:19)    Prescriptions: MOVIPREP 100 GM  SOLR (PEG-KCL-NACL-NASULF-NA ASC-C) As per prep instructions.  #1 x 0   Entered by:   Doristine Church RN II   Authorized by:   Iva Boop MD, [facility]   Signed by:   Doristine Church RN II on 08/30/2010   Method used:   Electronically to        [facility] [phone]* (retail)       [address]       [place], [place]  [address]       Ph: [phone]       Fax: [phone]   RxID:   [id number]

## 2010-09-09 NOTE — Letter (Signed)
Summary: Diabetic Instructions  [facility]  [address] [place], [place] [address]   Phone: [phone]  Fax: [phone]    Kristen [name] 11/07/1945 MRN: [id number]   _  _   ORAL DIABETIC MEDICATION INSTRUCTIONS  The day before your procedure:   Take your diabetic pill as you do normally  The day of your procedure:   Do not take your diabetic pill    We will check your blood sugar levels during the admission process and again in Recovery before discharging you home  ________________________________________________________________________  _  _   INSULIN (LONG ACTING) MEDICATION INSTRUCTIONS (Lantus, NPH, 70/30, Humulin, Novolin-N)   The day before your procedure:   Take  your regular evening dose    The day of your procedure:   Do not take your morning dose

## 2010-09-09 NOTE — Letter (Signed)
Summary: [facility] Instructions  [facility]  [address] [place], [place] [address]   Phone: [phone]  Fax: [phone]       ADRIELLE POLAKOWSKI    May 19, 1946    MRN: [id number]        Procedure Day Dorna Bloom:  Duanne Limerick  09/13/10     Arrival Time:  10:30AM     Procedure Time:  11:30AM     Location of Procedure:                    _X _  [facility] (4th Floor)                      PREPARATION FOR COLONOSCOPY WITH MOVIPREP   Starting 5 days prior to your procedure 09/08/10 do not eat nuts, seeds, popcorn, corn, beans, peas,  salads, or any raw vegetables.  Do not take any fiber supplements (e.g. Metamucil, Citrucel, and Benefiber).  THE DAY BEFORE YOUR PROCEDURE         DATE: 09/12/10  DAY: SUNDAY  1.  Drink clear liquids the entire day-NO SOLID FOOD  2.  Do not drink anything colored red or purple.  Avoid juices with pulp.  No orange juice.  3.  Drink at least 64 oz. (8 glasses) of fluid/clear liquids during the day to prevent dehydration and help the prep work efficiently.  CLEAR LIQUIDS INCLUDE: Water Jello Ice Popsicles Tea (sugar ok, no milk/cream) Powdered fruit flavored drinks Coffee (sugar ok, no milk/cream) Gatorade Juice: apple, white grape, white cranberry  Lemonade Clear bullion, consomm, broth Carbonated beverages (any kind) Strained chicken noodle soup Hard Candy                             4.  In the morning, mix first dose of MoviPrep solution:    Empty 1 Pouch A and 1 Pouch B into the disposable container    Add lukewarm drinking water to the top line of the container. Mix to dissolve    Refrigerate (mixed solution should be used within 24 hrs)  5.  Begin drinking the prep at 5:00 p.m. The MoviPrep container is divided by 4 marks.   Every 15 minutes drink the solution down to the next mark (approximately 8 oz) until the full liter is complete.   6.  Follow completed prep with 16 oz of clear liquid of your choice (Nothing red  or purple).  Continue to drink clear liquids until bedtime.  7.  Before going to bed, mix second dose of MoviPrep solution:    Empty 1 Pouch A and 1 Pouch B into the disposable container    Add lukewarm drinking water to the top line of the container. Mix to dissolve    Refrigerate  THE DAY OF YOUR PROCEDURE      DATE: 09/13/10  DAY: MONDAY  Beginning at 6:30AM (5 hours before procedure):         1. Every 15 minutes, drink the solution down to the next mark (approx 8 oz) until the full liter is complete.  2. Follow completed prep with 16 oz. of clear liquid of your choice.    3. You may drink clear liquids until 9:30AM (2 HOURS BEFORE PROCEDURE).   MEDICATION INSTRUCTIONS  Unless otherwise instructed, you should take regular prescription medications with a small sip of water   as early as possible the morning of your  procedure.  Diabetic patients - see separate instructions.    Additional medication instructions:  No iron for 5 days         OTHER INSTRUCTIONS  You will need a responsible adult at least 66 years of age to accompany you and drive you home.   This person must remain in the waiting room during your procedure.  Wear loose fitting clothing that is easily removed.  Leave jewelry and other valuables at home.  However, you may wish to bring a book to read or  an iPod/MP3 player to listen to music as you wait for your procedure to start.  Remove all body piercing jewelry and leave at home.  Total time from sign-in until discharge is approximately 2-3 hours.  You should go home directly after your procedure and rest.  You can resume normal activities the  day after your procedure.  The day of your procedure you should not:   Drive   Make legal decisions   Operate machinery   Drink alcohol   Return to work  You will receive specific instructions about eating, activities and medications before you leave.    The above instructions have been  reviewed and explained to me by   Doristine Church RN II  August 30, 2010 10:47 AM _______________________    I fully understand and can verbalize these instructions _____________________________ Date _________

## 2010-09-13 ENCOUNTER — Ambulatory Visit (AMBULATORY_SURGERY_CENTER): Payer: Self-pay | Admitting: Internal Medicine

## 2010-09-13 ENCOUNTER — Encounter: Payer: Self-pay | Admitting: Internal Medicine

## 2010-09-13 VITALS — BP 142/75 | HR 66 | Resp 16 | Ht 64.0 in | Wt 210.0 lb

## 2010-09-13 DIAGNOSIS — D126 Benign neoplasm of colon, unspecified: Secondary | ICD-10-CM

## 2010-09-13 DIAGNOSIS — Z1211 Encounter for screening for malignant neoplasm of colon: Secondary | ICD-10-CM

## 2010-09-13 DIAGNOSIS — K648 Other hemorrhoids: Secondary | ICD-10-CM

## 2010-09-13 LAB — HM COLONOSCOPY

## 2010-09-13 LAB — GLUCOSE, CAPILLARY
Glucose-Capillary: 164 mg/dL — ABNORMAL HIGH (ref 70–99)
Glucose-Capillary: 138 mg/dL — ABNORMAL HIGH (ref 70–99)

## 2010-09-13 NOTE — Patient Instructions (Signed)
Read all handouts from discharge;  If you have any questions, please call [phone].   It's been a pleasure taking care of you.

## 2010-09-14 ENCOUNTER — Telehealth: Payer: Self-pay | Admitting: *Deleted

## 2010-09-14 NOTE — Telephone Encounter (Signed)

## 2010-09-17 ENCOUNTER — Encounter: Payer: Self-pay | Admitting: Internal Medicine

## 2010-09-17 NOTE — Progress Notes (Signed)
Quick Note:  Colon recall 5 yrs 09/2015 2 diminutive adenomas  ______

## 2010-09-18 LAB — DIFFERENTIAL
Basophils Absolute: 0 10*3/uL (ref 0.0–0.1)
Lymphocytes Relative: 38 % (ref 12–46)
Neutro Abs: 3.3 10*3/uL (ref 1.7–7.7)

## 2010-09-18 LAB — CBC
HCT: 33.2 % — ABNORMAL LOW (ref 36.0–46.0)
HCT: 34 % — ABNORMAL LOW (ref 36.0–46.0)
MCV: 87.8 fL (ref 78.0–100.0)
Platelets: 196 10*3/uL (ref 150–400)
Platelets: 203 10*3/uL (ref 150–400)
Platelets: 216 10*3/uL (ref 150–400)
RDW: 16.3 % — ABNORMAL HIGH (ref 11.5–15.5)
RDW: 16.4 % — ABNORMAL HIGH (ref 11.5–15.5)
RDW: 16.7 % — ABNORMAL HIGH (ref 11.5–15.5)
WBC: 7.6 10*3/uL (ref 4.0–10.5)
WBC: 8 10*3/uL (ref 4.0–10.5)

## 2010-09-18 LAB — GLUCOSE, CAPILLARY
Glucose-Capillary: 112 mg/dL — ABNORMAL HIGH (ref 70–99)
Glucose-Capillary: 113 mg/dL — ABNORMAL HIGH (ref 70–99)
Glucose-Capillary: 126 mg/dL — ABNORMAL HIGH (ref 70–99)
Glucose-Capillary: 128 mg/dL — ABNORMAL HIGH (ref 70–99)
Glucose-Capillary: 130 mg/dL — ABNORMAL HIGH (ref 70–99)
Glucose-Capillary: 138 mg/dL — ABNORMAL HIGH (ref 70–99)
Glucose-Capillary: 158 mg/dL — ABNORMAL HIGH (ref 70–99)
Glucose-Capillary: 167 mg/dL — ABNORMAL HIGH (ref 70–99)
Glucose-Capillary: 87 mg/dL (ref 70–99)

## 2010-09-18 LAB — HEPATIC FUNCTION PANEL
Albumin: 3.5 g/dL (ref 3.5–5.2)
Total Protein: 7.3 g/dL (ref 6.0–8.3)

## 2010-09-18 LAB — BASIC METABOLIC PANEL
BUN: 12 mg/dL (ref 6–23)
BUN: 13 mg/dL (ref 6–23)
Calcium: 9 mg/dL (ref 8.4–10.5)
Chloride: 101 mEq/L (ref 96–112)
Chloride: 103 mEq/L (ref 96–112)
Creatinine, Ser: 0.87 mg/dL (ref 0.4–1.2)
Creatinine, Ser: 0.95 mg/dL (ref 0.4–1.2)
GFR calc non Af Amer: 50 mL/min — ABNORMAL LOW (ref >60)
GFR calc non Af Amer: 59 mL/min — ABNORMAL LOW (ref >60)
Glucose, Bld: 126 mg/dL — ABNORMAL HIGH (ref 70–99)
Glucose, Bld: 140 mg/dL — ABNORMAL HIGH (ref 70–99)
Glucose, Bld: 152 mg/dL — ABNORMAL HIGH (ref 70–99)
Potassium: 4 mEq/L (ref 3.5–5.1)
Potassium: 4.6 mEq/L (ref 3.5–5.1)
Sodium: 141 mEq/L (ref 135–145)

## 2010-09-18 LAB — APTT: aPTT: 34 seconds (ref 24–37)

## 2010-09-18 LAB — CARDIAC PANEL(CRET KIN+CKTOT+MB+TROPI)
CK, MB: 1.4 ng/mL (ref 0.3–4.0)
Total CK: 44 U/L (ref 7–177)
Troponin I: 0.02 ng/mL (ref 0.00–0.06)

## 2010-09-18 LAB — POCT I-STAT, CHEM 8
BUN: 15 mg/dL (ref 6–23)
Chloride: 106 mEq/L (ref 96–112)
Sodium: 138 mEq/L (ref 135–145)
TCO2: 24 mmol/L (ref 0–100)

## 2010-09-18 LAB — PROTIME-INR
INR: 1.1 (ref 0.00–1.49)
Prothrombin Time: 14 seconds (ref 11.6–15.2)

## 2010-09-18 LAB — TROPONIN I: Troponin I: 0.01 ng/mL (ref 0.00–0.06)

## 2010-09-18 LAB — POCT CARDIAC MARKERS: Myoglobin, poc: 36.8 ng/mL (ref 12–200)

## 2010-09-18 LAB — HEPARIN LEVEL (UNFRACTIONATED)
Heparin Unfractionated: 0.59 IU/mL (ref 0.30–0.70)
Heparin Unfractionated: 0.77 IU/mL — ABNORMAL HIGH (ref 0.30–0.70)

## 2010-09-18 LAB — D-DIMER, QUANTITATIVE: D-Dimer, Quant: <0.22 ug/mL-FEU (ref 0.00–0.48)

## 2010-09-19 LAB — POCT CARDIAC MARKERS
CKMB, poc: 1.1 ng/mL (ref 1.0–8.0)
Myoglobin, poc: 38.4 ng/mL (ref 12–200)
Myoglobin, poc: 59.2 ng/mL (ref 12–200)

## 2010-09-19 LAB — GLUCOSE, CAPILLARY
Glucose-Capillary: 102 mg/dL — ABNORMAL HIGH (ref 70–99)
Glucose-Capillary: 85 mg/dL (ref 70–99)
Glucose-Capillary: 93 mg/dL (ref 70–99)

## 2010-09-19 LAB — BASIC METABOLIC PANEL
BUN: 17 mg/dL (ref 6–23)
Calcium: 8.6 mg/dL (ref 8.4–10.5)
GFR calc non Af Amer: >60 mL/min (ref >60)
Glucose, Bld: 95 mg/dL (ref 70–99)
Sodium: 139 mEq/L (ref 135–145)

## 2010-09-19 LAB — DIFFERENTIAL
Basophils Absolute: 0 10*3/uL (ref 0.0–0.1)
Lymphocytes Relative: 43 % (ref 12–46)
Neutro Abs: 3.3 10*3/uL (ref 1.7–7.7)

## 2010-09-19 LAB — CBC
Platelets: 254 10*3/uL (ref 150–400)
RDW: 16.3 % — ABNORMAL HIGH (ref 11.5–15.5)

## 2010-09-23 LAB — POCT I-STAT, CHEM 8
BUN: 19 mg/dL (ref 6–23)
Chloride: 102 mEq/L (ref 96–112)
Creatinine, Ser: 0.8 mg/dL (ref 0.4–1.2)
Glucose, Bld: 323 mg/dL — ABNORMAL HIGH (ref 70–99)
Potassium: 5 mEq/L (ref 3.5–5.1)

## 2010-09-23 LAB — DIFFERENTIAL
Eosinophils Absolute: 0.1 10*3/uL (ref 0.0–0.7)
Eosinophils Relative: 1 % (ref 0–5)
Lymphs Abs: 0.5 10*3/uL — ABNORMAL LOW (ref 0.7–4.0)

## 2010-09-23 LAB — URINALYSIS, ROUTINE W REFLEX MICROSCOPIC
Bilirubin Urine: NEGATIVE
Glucose, UA: >1000 mg/dL — AB
Hgb urine dipstick: NEGATIVE
Ketones, ur: 15 mg/dL — AB
Leukocytes, UA: NEGATIVE
Protein, ur: 100 mg/dL — AB

## 2010-09-23 LAB — CBC
HCT: 40.9 % (ref 36.0–46.0)
MCV: 86.9 fL (ref 78.0–100.0)
Platelets: 195 10*3/uL (ref 150–400)
RDW: 16.3 % — ABNORMAL HIGH (ref 11.5–15.5)
WBC: 10.6 10*3/uL — ABNORMAL HIGH (ref 4.0–10.5)

## 2010-10-26 NOTE — Discharge Summary (Signed)
NAME:  Kristen [name], RHEIN NO.:  [id number]   MEDICAL RECORD NO.:  [id number]          PATIENT TYPE:  INP   LOCATION:  1426                         FACILITY:  [facility]   PHYSICIAN:  Hettie Holstein, D.O.    DATE OF BIRTH:  09-09-1945   DATE OF ADMISSION:  11/01/2007  DATE OF DISCHARGE:                               DISCHARGE SUMMARY   ADDENDUM:  This is in review of the medications.  The medications are  reconciled and I agree with these upon discharge.  #12 is blank on the  preliminary transcription.  That medication is actually Pepcid 20 mg  p.o. b.i.d.  Her Lantus at this time is being held due to the  hypoglycemic event, she is typically on 15 units of subcu nightly.  She  could revisit this in the outpatient setting.  Her hemoglobin A1c is  6.8.  For now due to the evidence of hypoglycemia we are recommending  only metformin at 1 gram twice daily and holding all of her other  therapies with NovoLog sliding scale, sensitive, with a CBGs to occur  q.a.c. and at bedtime while in the rehab setting.  Otherwise, her  lisinopril is, as well, being held due to hyperkalemia and renal  insufficiency on presentation.  Additional antihypertensive agents may  be required, though at present, she is normotensive.  Would recommend  following up her H&H and transfusing as needed.  She received 2 units  PRBC here.  Otherwise, would continue medications as outlined by Dr.  Sable Feil service. The patient is medically stable to transition to a  rehab setting.  Foley catheter can be discontinued.      Hettie Holstein, D.O.  Electronically Signed     ESS/MEDQ  D:  11/08/2007  T:  11/08/2007  Job:  [id number]

## 2010-10-26 NOTE — Assessment & Plan Note (Signed)
[facility]                            CARDIOLOGY OFFICE NOTE   NAME:Kristen [name], Kristen [name]                      MRN:          [id number]  DATE:10/24/2007                            DOB:          Apr 28, 1946    Kristen [name] comes in today for her preoperative clearance for left hip  replacement.  She has degenerative disease, and this is scheduled in the  near future with Dr. Hadassah Pais.   She has a history of nonobstructive coronary disease.  She is having no  angina or any symptoms of ischemia.  She denies any orthopnea, PND, or  peripheral edema.  Her gait has been much steadier and she is avoiding  falls.  She is living in a skilled nursing facility and comes with her  caretaker today.   Her last stress Myoview was June 14, 2006 which showed an ejection  fraction of 71% with only minimal anterior wall ischemia versus shifting  breast attenuation.  It was felt to be a low-risk scan.  She had normal  contractility and thickening in all areas of the myocardium.   She denies any orthopnea, PND or peripheral edema.  She has had no  palpitations, syncope, or presyncope.  She has no significant valve  disease.   CURRENT MEDICATIONS:  1. Alprazolam 0.5 mg a day.  2. Aspirin 81 mg a day.  3. Detrol 4 mg a day.  4. Glimepiride 4 mg a day.  5. Famotidine 20 mg p.o. b.i.d.  6. Lisinopril 10 mg a day.  7. Vytorin 10/40 daily.  8. Isosorbide 30 mg a day.  9. Lantus.  10.Fluoxetine 40 mg daily.  11.Metformin 1000 mg p.o. b.i.d.   Her blood pressure today is 110/70, pulse is 78 and regular, weight is  226.  HEENT:  Normocephalic, atraumatic.  PERRLA, extraocular movements  intact, sclerae clear, facial symmetry is normal.  She is alert and oriented x3.  She is very pleasant.  Carotids are full, no bruits.  Thyroid is not enlarged, trachea is  midline.  Lungs are clear.  Heart reveals a nondisplaced PMI, shows normal S1, S2 without gallop.  ABDOMINAL  EXAM:  Obese with good bowel sounds.  Organomegaly cannot be  assessed.  EXTREMITIES:  Reveal no cyanosis, clubbing, or edema.  Pulses are  intact.  NEURO EXAM:  Intact.   EKG shows sinus rhythm with a right bundle branch block which is old, as  well as a left axis deviation which is old.   ASSESSMENT AND PLAN:  Kristen [name] is stable from my standpoint.  I have  cleared her for surgery.  We would like to see her during her  hospitalization.     Thomas C. Daleen Squibb, MD, [facility]  Electronically Signed    TCW/MedQ  DD: 10/24/2007  DT: 10/24/2007  Job #: [id number]   cc:   Molly Maduro A. Thurston Hole, M.D.

## 2010-10-26 NOTE — Cardiovascular Report (Signed)
NAME:  Kristen [name], BAHNER NO.:  [id number]   MEDICAL RECORD NO.:  [id number]          PATIENT TYPE:  INP   LOCATION:  3705                         FACILITY:  [facility]   PHYSICIAN:  Arturo Morton. Riley Kill, MD, FACCDATE OF BIRTH:  1945/11/23   DATE OF PROCEDURE:  12/04/2006  DATE OF DISCHARGE:                            CARDIAC CATHETERIZATION   INDICATIONS:  This patient has a long history of chest discomfort.  She  has undergone previous cardiac catheterization.  Minor irregularities  have been noted.  The current study was done because of recurrent chest  pain requiring Vicodin.  Procedural risks were discussed and the patient  was willing to proceed.   PROCEDURES:  1. Left heart catheterization.  2. Selective coronary arteriography.  3. Selective left ventriculography.   DESCRIPTION OF PROCEDURE:  The patient was brought to the  catheterization lab and prepped and draped in the usual fashion through  an anterior puncture of the right femoral artery was entered.  A 5-  French sheath was placed.  Views of left and right coronaries were  obtained in multiple angiographic projections.  Central aortic and left  ventricular pressures were measured with pigtail.  Ventriculography was  performed in the RAO projection.  There were no complications.  She was  taken to the holding area in satisfactory clinical condition for direct  manual hemostasis.   HEMODYNAMIC DATA:  1. Central aortic pressure was 147/86, mean 111.  2. Left ventricular pressure is 103/14.  3. There was no gradient and pullback across the aortic valve.   ANGIOGRAPHIC DATA:  1. The left main coronary artery was free of critical disease.  2. Left anterior descending artery coursed to the apex.  There were      two major diagonal branches.  No significant areas of high-grade      obstruction were noted.  3. There was a ramus intermedius and it bifurcates distally and is      without significant focal  narrowing.  4. The circumflex proper has about 30% eccentric plaquing in its      proximal portion.  The vessel terminates as a marginal branch and      has several submarginal branches.  It is without critical      narrowing.  5. The right coronary artery provides posterior descending and      posterolateral branch and is free of critical disease.  6. Ventriculography in the RAO projection reveals preserved global      systolic function.  No segmental abnormalities or contractions are      identified.   CONCLUSIONS:  1. Preserved left ventricular function.  2. No areas of high-grade critical coronary obstruction.   DISPOSITION:  The patient will be treated medically.  Continued followup  with Dr. Daleen Squibb.      Arturo Morton. Riley Kill, MD, [facility]  Electronically Signed     TDS/MEDQ  D:  12/04/2006  T:  12/05/2006  Job:  [id number]   cc:   [facility]

## 2010-10-26 NOTE — H&P (Signed)
NAME:  Kristen [name], Kristen [name] NO.:  [id number]   MEDICAL RECORD NO.:  [id number]          PATIENT TYPE:  INP   LOCATION:  1823                         FACILITY:  [facility]   PHYSICIAN:  Rod Holler, MD     DATE OF BIRTH:  02-May-1946   DATE OF ADMISSION:  12/01/2006  DATE OF DISCHARGE:                              HISTORY & PHYSICAL   CARDIOLOGIST:  Maisie Fus C. Wall, MD, [facility].   CHIEF COMPLAINT:  Chest pain.   HISTORY OF PRESENT ILLNESS:  Kristen [name] is a 65 year old female with  history of hypertension, hyperlipidemia, diabetes mellitus type 2,  chronic chest discomfort who presents to the emergency department with  complaints of chest pain.  Today at about 4 p.m., while at rest, the  patient had onset of sharp left-sided chest discomfort and pressure with  radiation to her left upper extremity. This was associated with  shortness of breath and lightheadedness.  She also noticed that her  glucoses were in the 300s at this time.  She went and lie down and the  chest discomfort improved after a couple minutes.  The last time known  episode of chest discomfort that she had was in January.  There was no  associated palpitations, no syncope or presyncope.  She has had no  recent heart failure symptoms to include PND, orthopnea, lower extremity  edema, or dyspnea on exertion.  At present, the chest discomfort is much  improved.  Of note, the patient has a cardiac catheterization in March  2006 with no obstructive coronary disease.  The patient had an adenosine  Myoview in January 2008 which was read as very mild anterior ischemia,  possible breast attenuation, low risk study.   PAST MEDICAL HISTORY:  1. Diabetes mellitus type 2.  2. GERD.  3. Hyperlipidemia.  4. Depression.  5. Migraines.  6. Degenerative disk disease.  7. History of anemia.  8. Nephrolithiasis.  9. Chronic chest pain.  10.Cardiac catheterization in March 2006 with 20% second diagonal, 20%      RCA.  Adenosine Myoview in January 2008 was very mild anterior      ischemia, possible breast attenuation.  11.Hypertension.   MEDICATIONS:  1. Vytorin 10/40 one tablet p.o. at bedtime.  2. Pepcid 20 mg p.o. b.i.d.  3. Fluoxetine 40 mg p.o. daily.  4. Metformin 1000 mg p.o. b.i.d.  5. Motrin p.r.n.  6. Isordil 30 mg p.o. daily.  7. Meclizine 25 mg p.o. q.8h. p.r.n.  8. Lortab 5/500 q.4 h. P.r.n.  9. Detrol LA 4 mg p.o. daily.  10.Aspirin 81 mg p.o. daily.  11.Xanax 0.5 mg p.o. at bedtime.  12.Lisinopril 10 mg p.o. daily.  13.Glimepiride 4 mg p.o. daily.   ALLERGIES:  SULFA.   SOCIAL HISTORY:  The patient is a nonsmoker, nondrinker, lives at home  with family members.   FAMILY HISTORY:  Mother died at age 47 of unknown causes.  Father died  at age 35 with myocardial infarction.   REVIEW OF SYSTEMS:  All systems reviewed and are negative except as in  history of present illness.  EKG:  Sinus rhythm, right bundle branch  block, no anterior T wave changes.   PHYSICAL EXAMINATION:  VITAL SIGNS:  Temperature 98.5, blood pressure  127/72, heart rate 85, respiratory rate 16.  Oxygen saturation 96%.  GENERAL:  Obese female.  Alert and oriented x3.  In no apparent  distress.  HEENT:  Atraumatic, normocephalic.  Pupils are equal, round and reactive  to light.  Extraocular movements intact.  NECK:  Supple.  No adenopathy.  No JVD, no carotid bruits.  CHEST:  Lungs clear to auscultation bilaterally with equal, bilateral  breath sounds.  HEART:  Regular rate and rhythm.  Normal S1 and S2.  No murmur, rub, or  gallops.  ABDOMEN:  Soft, nontender, nondistended.  Active bowel sounds.  EXTREMITIES:  No clubbing, cyanosis or edema.  NEUROLOGIC:  No focal deficits.   LABORATORY DATA:  White blood cell count 8.7, hematocrit 7.4, platelet  count 268.  Sodium 140, potassium 4.4, chloride 106, bicarb 26, BUN 15,  creatinine 1, glucose 168.  Troponin less than 0.05.  CK-MB 1.1.  Chest  x-ray  pending.   IMPRESSION AND PLAN:  A 65 year old female with a cardiac  catheterization in 2006 with no obstructive coronary disease, low risk  adenosine Myoview in January 2008, who presented with sharp left-sided  chest discomfort.  1. Admit the patient to telemetry.  Rule out serial cardiac enzymes.      Continue cardiovascular medicines to include aspirin, ACE      inhibitor, daily EKGs.  Pulmonary followup with chest x-ray.  2. Endocrine/insulin.  3. Fluids, electrolytes, and nutrition.  Diabetic diet.  4. GI:  H2 blocker.  5. Prophylaxis: DVT prophylaxis with subcutaneous heparin.      Rod Holler, MD  Electronically Signed     TRK/MEDQ  D:  12/02/2006  T:  12/02/2006  Job:  [id number]

## 2010-10-26 NOTE — Op Note (Signed)
NAME:  Kristen [name], CARBERRY NO.:  [id number]   MEDICAL RECORD NO.:  [id number]          PATIENT TYPE:  INP   LOCATION:  0004                         FACILITY:  [facility]   PHYSICIAN:  John L. Rendall, M.D.  DATE OF BIRTH:  Apr 03, 1946   DATE OF PROCEDURE:  11/01/2007  DATE OF DISCHARGE:                               OPERATIVE REPORT   PREOPERATIVE DIAGNOSIS:  Osteoarthritis, left hip.   POSTOPERATIVE DIAGNOSIS:  Osteoarthritis, left hip.   SURGICAL PROCEDURES:  Left AML total hip replacement.   SURGEON:  Rendall.   ASSISTANT:  Duffy, PA-C.   ANESTHESIA:  Epidural.   PROCEDURE:  Under epidural anesthesia the left hip was prepared with  DuraPrep and draped as a sterile field in the right lateral decubitus  position.  The posterior approach was marked out and an approximate 7-  inch incision was made.  Multiple small vessels were cauterized.  The  Charnley retractor was inserted.  With internal rotation of the hip, the  short external rotators and hip capsule were taken down from bone and  the hip was dislocated.  The superior femoral neck was exposed.  The IM  initiator and canal finders were used.  The canal was then reamed  progressively to 13 mm, which had excellent chatter fit.  The femoral  neck was then osteotomized about a fingerbreadth above the lesser  trochanter.  Rasping was then done with the 10.05 narrow, 12 and 13.5  narrow.  Calcar reaming was done after the 12.5.  With the femoral side  completed, attention was turned to the acetabular side.  Two cobra  retractors were placed inferiorly and 2 wing retractors superiorly.  The  labrum was excised.  The edge of the acetabulum was debrided with a Cobb  elevator.  The ligamentum teres was removed.  Bleeding was controlled  with a center of the acetabulum, then reaming was done progressively 43,  45, 47, 49, 50 and this seemed to give a fairly good circumferential  fit.  Trial seating of a 50 bottomed out  nicely and reaming was then  done with a 51 and a 52-100 series Pinnacle cup was inserted with  excellent fit, alignment, and stability.  The Sputnik guide was used to  assist in placement.  Trial seating then of a acetabular liner plus 4-mm  10-degree angle, 52-mm outside and 36-mm inside diameter, was tried and  the rasp and +1.5, 36-mm hip ball are all used.  This gave excellent  fit, alignment, stability with leg lengths that appeared virtually  equal.  At this point permanent components were obtained.  The marathon  acetabular liner was inserted plus four 10-degrees angle 36-mm inside 52  outside and apex hole eliminator was used.  The AML small stature 13.5  stem was then used and this was impacted until it would not go any  further.  It was about 4 mm from seating on the calcar.  To make sure  that appropriate neck length on the ball was used, a trial ball was used  first and the hip was stable through full range  of motion; internal  rotation, full flexion, full extension.  To get full extension, there  was a requirement of slight anterior capsular release from the femur,  and once this was done, it was readily possible.  At this point the  permanent hip ball was placed and impacted.  The hip was reduced.  The capsule and piriformis were sutured superiorly.  The IT band was  then closed with #1 Tycron, the subcu with 2-0 Quill, and the skin with  2-0 Vicryl and skin clips.  The patient tolerated the procedure well and  returned to recovery in good condition.      John L. Rendall, M.D.  Electronically Signed     JLR/MEDQ  D:  11/01/2007  T:  11/01/2007  Job:  [id number]

## 2010-10-26 NOTE — Consult Note (Signed)
NAME:  [name], Kristen NO.:  [id number]   MEDICAL RECORD NO.:  [id number]          PATIENT TYPE:  INP   LOCATION:  1426                         FACILITY:  [facility]   PHYSICIAN:  Madolyn Frieze. Jens Som, MD, FACCDATE OF BIRTH:  1945-07-17   DATE OF CONSULTATION:  11/01/2007  DATE OF DISCHARGE:                                 CONSULTATION   REQUESTING PHYSICIAN:  Dr. Fredric Mare.   PRIMARY CARDIOLOGIST:  Jesse Sans. Wall, MD, FACC   REASON FOR CONSULTATION:  Postop cardiology management in the patient  with recent left hip replacement.   HISTORY OF PRESENT ILLNESS:  This is a 65 year old Caucasian female with  known history of nonobstructive CAD, diabetes, hyperlipidemia,  hypertension, and chronic angina, who has had a left hip replacement  today per Dr. Fredric Mare.  We are asked to follow postoperatively to monitor  from a cardiology standpoint.  The patient has been asymptomatic from a  cardiac standpoint with no complaints of chest pain, shortness of  breath, nausea, vomiting, or diaphoresis.  The patient has chronic  degenerative disk disease and left hip pain, which elicited surgery.  Currently, the patient is being seen in PACU.  She is still a little  groggy, but is responsive and has no cardiac complaints at this time.  EKG was completed in PACU revealing a normal sinus rhythm with left axis  deviation, right bundle-branch block, and some mild left ventricular  hypertrophy, which is unchanged from prior EKG.   REVIEW OF SYSTEMS:  Positive for chronic arthralgia with left hip pain,  mild dyspnea on exertion in the past, and chronic angina in the past,  currently none.  She denies any fevers, chills, diaphoresis, nausea, or  vomiting at this time.  Otherwise, systems were reviewed and found to be  negative.   PAST MEDICAL HISTORY:  1. Nonobstructive CAD.  A:  Status post cardiac catheterization in June 2008 revealing 30%  eccentric plaquing in the circumflex proper,  otherwise right coronary  artery had no critical disease, LAD no critical disease, left main was  free of critical disease.  LVEF was 70% at that time.  1. Diabetes mellitus type 2.  2. Hyperlipidemia.  3. Hypertension.  4. Depression.  5. Migraine.  6. Anemia.  7. Degenerative disk disease.   PAST SURGICAL HISTORY:  The patient had a recent left hip replacement on  the day of this consultation.  Please see prior H&P for further details  on prior history.   SOCIAL HISTORY:  The patient lives in a skilled nursing facility in  [place].  She is widowed.  She does not smoke and does not drink  alcohol.   FAMILY HISTORY:  Mother died of unknown causes at age 1.  Father died  of MI at age 60.  Siblings are unknown at this time as the patient is  mildly sedated postoperatively.   CURRENT MEDICATIONS:  Robaxin IV, Ancef IV, fentanyl IV, insulin per  sliding scale, and Zofran p.r.n.   MEDICATIONS PRIOR TO ADMISSION:  1. Alprazolam 0.5 mg daily.  2. Aspirin 81 mg daily.  3. Zestril  4 mg daily.  4. Glimepiride 4 mg a day.  5. Famotidine 20 mg b.i.d.  6. Lisinopril 10 mg daily.  7. Vytorin 10/40 daily.  8. Isosorbide 30 mg daily.  9. Lantus insulin.  10.Fluoxetine 20 mg daily.  11.Metformin 1000 mg p.o. b.i.d.   ALLERGIES:  SULFA.   CURRENT LABS:  Hemoglobin 9.8 and hematocrit 29.2 (status post 1 unit of  packed red blood cells).  Sodium 137, potassium 4.9, chloride 103, CO2  of 26, BUN 12, creatinine 0.95, and glucose 204.  EKG revealing normal  sinus rhythm, rate of 83 beats per minute with right bundle-branch block  and left ventricular hypertrophy.   PHYSICAL EXAMINATION:  VITAL SIGNS:  Blood pressure 95/53, pulse 86,  respirations 26, and O2 sat 95% via O2 mask.  GENERAL:  She is groggy postoperatively, but responsive appropriately.  HEENT:  Head is normocephalic and atraumatic.  Eyes, PERRLA.  Mucous  membranes and mouth moist.  Tongue is midline.  NECK:  Soft.   There is no JVD.  No carotid bruits appreciated.  CARDIOVASCULAR:  Regular rate and rhythm without murmurs, rubs, or  gallops.  Pulses are 2+ and equal without bruits.  LUNGS:  Clear to auscultation laterally and anteriorly.  Poor  respiratory effort is noticed.  ABDOMEN:  Obese and nontender with 2+ bowel sounds.  EXTREMITIES:  Without clubbing, cyanosis, or edema.  She has  postoperative left hip incision and bilateral SCD devices on.  NEURO:  Cranial nerves II through XII are grossly intact.  She is  complaining of repetitive pain on the left hip pain and she is mildly  diminished secondary to sedation postoperatively.   IMPRESSION:  1. Status post left hip replacement.  2. History of nonobstructive coronary artery disease.  3. Chronic angina, asymptomatic at this time.  4. Diabetes mellitus type 2.  5. Mild anxiety.   PLAN:  This is a 65 year old Caucasian female whom we were asked to  follow postoperatively after left hip replacement secondary to  degenerative disk disease with history of nonobstructive coronary artery  disease and chronic angina.  The patient is seen and examined by myself  and Dr. Olga Millers in PACU.  The patient's cath on December 04, 2006,  revealed normal LV function and no obstructive CAD.  Myoview previously  prior to cath with questionable anterior ischemia versus breast  attenuation.  The patient has not had any complaints of chest pain or  shortness of breath.  Her main complaint now is left hip pain  postoperatively.  EKG reveals normal sinus rhythm  with right bundle-branch block and LVH with no ST-T wave changes.  The  patient is mildly hypotensive postoperatively.  We will continue aspirin  and statin postoperatively.  We will hold lisinopril until her blood  pressure improves and follows making further recommendations if  necessary during hospital course.      Bettey Mare. Lyman Bishop, NP      Madolyn Frieze. Jens Som, MD, [facility]  Electronically  Signed    KML/MEDQ  D:  11/01/2007  T:  11/02/2007  Job:  [id number]   cc:   Dr. Wyline Mood

## 2010-10-26 NOTE — H&P (Signed)
NAME:  Kristen [name] NO.:  [id number]   MEDICAL RECORD NO.:  [id number]          PATIENT TYPE:  INP   LOCATION:  2021                         FACILITY:  [facility]   PHYSICIAN:  Kristen Sans. Wall, MD, FACCDATE OF BIRTH:  1946/01/18   DATE OF ADMISSION:  02/02/2009  DATE OF DISCHARGE:                              HISTORY & PHYSICAL   PRIMARY CARDIOLOGIST:  Kristen Fus C. Wall, MD, Kristen [name]   CHIEF COMPLAINTS:  Chest pain.   HISTORY OF PRESENT ILLNESS:  Kristen [name] is a 65 year old Caucasian  female with known nonobstructive coronary artery disease, last cath in  June 2008 that revealed above, as well as normal LV function.  Risk  factors for CAD include hypertension, hyperlipidemia, obesity/sedentary  lifestyle, also CAD equivalent of type 2 diabetes mellitus.  Kristen patient  also has a history significant for chronic chest pain, GERD,  depression/anxiety.  Kristen patient now presents with her typical anginal  symptoms.  She was in her usual state of health up until being awakened  this morning with sharp, moderate in severity, left-sided chest pain  with radiation up into Kristen left shoulder and down her left arm with  associated nausea, tachy palpitations, and mild shortness of breath.  Symptoms waxed and waned, lasting approximately 10-15 minutes and then  resolving on their own for a few minutes and then returning.  Kristen  patient went to Kristen urgent care today and was given 2 sublingual  nitroglycerin with some relief and then IV morphine 2 mg x1 with further  relief.  She was sent to [name] ED for further eval where she was  given 2 more sublingual nitroglycerin and morphine 2 mg IV push x1  again.  Her symptoms did improve.  She continued to have mild nausea and  was given Zofran 4 mg IV, and she was started on Nitropaste 1 inch.  Upon arrival, she was normotensive with heart rate within normal limits.  EKG showed no acute changes, and chest x-ray no acute cardiopulmonary  disease.  First set of point-of-care markers is negative.  Kristen rest of  her labs are unremarkable.  Note, Kristen patient is currently complaining  of minimal symptoms.   PAST MEDICAL HISTORY:  1. CAD, nonobstructive per last cath, June 2008.  2. Diabetes mellitus, type 2.  3. Hypertension.  4. Hyperlipidemia.  5. Chronic chest pain.  6. Anemia.  7. GERD.  8. Osteoarthritis, S/P left total hip arthroplasty.  9. Depression/anxiety.  10.Insomnia.  11.Obesity/sedentary lifestyle.   SOCIAL HISTORY:  Kristen [place] with her daughter and  her son-in-law.  She has been on disability for several years.  She has  no significant smoking, EtOH, illicit drug use, or herbal medication  use.  She follows an ADA diet and is active around Kristen house but does no  regular exercise.   FAMILY HISTORY:  Negative for premature diagnosis of CAD.  Mother  deceased at age 30 of cancer.  Father deceased at age 99 from an MI (his  first).  Three brothers deceased from cancer.  One brother living and  one sister living, none with any history of CAD.   REVIEW OF SYSTEMS:  Kristen patient has intermittent chronic headaches, as  well as HPI symptoms.  All other systems reviewed and were negative.   CODE STATUS:  Full.   ALLERGIES:  SULFA (hives).   MEDICATIONS:  Kristen patient has received sublingual nitroglycerin x4 over  Kristen course of Kristen morning, as well as 2 IV doses of morphine at 2 mg  each.  Zofran 4 mg IV x1.  Currently has 1-inch Nitropaste on.  Home  medications include:  1. Alprazolam 0.5 mg p.o. daily.  2. Low-dose enteric-coated aspirin daily.  3. Detrol 4 mg p.o. daily.  4. Actos ?dose daily.  5. Metformin 1 g daily.  6. Lantus 46 units subcu injection nightly.  7. Regular insulin 10 units just prior to each meal.  8. Lisinopril 10 mg p.o. daily.  9. Vytorin 10/40 p.o. daily.  10.Isosorbide 30 mg p.o. daily.  11.Fluoxetine 40 mg p.o. daily.   PHYSICAL EXAMINATION:  VITAL  SIGNS:  Temperature 98.6 degrees  Fahrenheit, BP 105/68, pulse 58, respiratory rate 11, O2 saturation 97%  on 2 liters by nasal cannula.  GENERAL:  Kristen patient is alert, oriented x3, in no apparent stress, able  to speak in full sentences without respiratory distress.  HEENT:  Head normocephalic, atraumatic.  Pupils equal, round, and  reactive to light.  Extraocular muscles are intact.  Nares are patent  without discharge.  Kristen patient wears upper and lower dentures.  Oropharynx without erythema or exudates.  NECK:  Supple without lymphadenopathy or thyromegaly.  No JVD.  No  bruits.  HEART:  Rate is regular with audible S1 and S2.  No clicks, rubs, or  gallops but 1/6 soft murmur.  Pulses 2+ equal in both upper and lower  extremities bilaterally.  LUNGS:  Clear to auscultation bilaterally.  SKIN:  No rash, lesions, or petechiae.  ABDOMEN:  Soft, nontender, nondistended.  Normal abdominal bowel sounds.  No rebound or guarding.  No hepatosplenomegaly.  Kristen patient is morbidly  obese.  EXTREMITIES:  No clubbing, cyanosis, or edema.  MUSCULOSKELETAL:  No joint deformity or effusions.  No spinal or CVA  tenderness.  NEUROLOGIC:  Cranial nerves II through XII were grossly intact.  Strength 5/5 in all extremities and axial groups.  Normal sensation  throughout, and normal cerebellar function.   RADIOLOGY:  1. Chest x-ray shows no acute cardiopulmonary disease.  2. EKG shows first-degree AV block with right bundle as well as left      anterior fascicular block and nonspecific ST-T wave changes, rate      61, no Q waves, left ventricular hypertrophy.  PR 232, QRS 140, QTC      447.   LABORATORY DATA:  WBC 6.8, hgb 11.9, HCT 35.8, PLT count 216.  Sodium  138, potassium 4.9, chloride 106, BUN 15, creatinine 0.6, glucose 102.  Point-of-care markers negative x1.   ASSESSMENT AND PLAN:  Kristen [name] is a 65 year old Caucasian female with  nonobstructive coronary artery disease, as well as  above history  including chronic angina and 4 cardiac catheterizations without evidence  of obstructive disease pairing with her typical chest pain.  Given Kristen  patient's concern and Kristen acute onset of her symptoms, we will admit to  telemetry and rule out with cardiac enzymes.  We will continue her home  medications with an increase of her Imdur from 30 to 60 mg p.o. q.a.m.,  and if cardiac enzymes  are negative x3 not including her first set of  point-of-care markers, which were already negative, we will discharge  tomorrow with early followup for outpatient Myoview.  Otherwise, if her  enzymes are positive, cardiac cath appointment.  Kristen patient's primary  cardiologist, Dr. Daleen Squibb, is  aware of this assessment and plan.  He has  seen and assessed Kristen patient himself and agrees.      Jarrett Ables, PAC      Thomas C. Daleen Squibb, MD, [name]  Electronically Signed    MS/MEDQ  D:  02/02/2009  T:  02/03/2009  Job:  [id number]

## 2010-10-26 NOTE — Discharge Summary (Signed)
NAME:  LAVINE, HARGROVE               ACCOUNT NO.:  [id number]   MEDICAL RECORD NO.:  [id number]          PATIENT TYPE:  INP   LOCATION:  1426                         FACILITY:  [facility]   PHYSICIAN:  John L. Rendall, M.D.  DATE OF BIRTH:  [birth year]   DATE OF ADMISSION:  11/01/2007  DATE OF DISCHARGE:  11/06/2007                               DISCHARGE SUMMARY   ADMISSION DIAGNOSIS:  Osteoarthritis of the left hip.   DISCHARGE DIAGNOSES:  1. Osteoarthritis of the left hip.  2. Coronary artery disease status post coronary artery bypass graft.  3. Diabetes mellitus.  4. Hyperlipidemia.  5. Hypertension.  6. Anemia.  7. Gastroesophageal reflux disease.  8. Depression/anxiety.  9. Chronic angina.  10.Hyperkalemia.  11.Acute blood loss anemia.  12.Insomnia.   PROCEDURE:  Left total hip arthroplasty.   HISTORY:  This 65 year old white female presents with a 28-month history  of sudden onset of progressive left hip pain.  She denies any history of  injury or trauma or surgery.  She thought she was having a problem with  her left total knee initially.  The pain was constant and throbbing and  some form of sharpness to it.  It was mainly in the left buttock and  groin with radiation into the knee.  It got worsens with walking and  decreases with lying down.  She does have some catching and popping and  locking and inability to sleep secondary to the left hip pain.  She has  difficulty with her socks and shoes.  Denies any back pain or  paresthesias.  She has been using Vicodin and ibuprofen with only  minimal relief.  Indicated at this time for total hip arthroplasty.   HOSPITAL COURSE:  A 65 year old white female admitted Nov 02, 2007 after  appropriate laboratory studies were obtained and 2 grams of Ancef IV on  call to the operating room.  She was taken to the operating room where  she underwent a left total hip arthroplasty.  She tolerated the  procedure well.  She was continued on  Ancef 1 gram IV q.6h. for three  doses.  She was started on Arixtra 2.5 mg subcu at 8 a.m. for 10 days  starting on Nov 02, 2007.  Consultations to PT, OT and care management  were made.  She may be ambulating weightbearing as tolerated with a  walker.  Foley was placed intraoperatively.  A urine was repeated once  the Foley was inserted.  She was seen by the hospitalists for  evaluation.  They started her on enteric-coated aspirin 81 mg daily and  Vytorin 10/40.  There were some difficulties with her, and it was felt  that she should be placed on telemetry.  Apparently, potassium level on  Nov 02, 2007 was 5.2.  Repeat was 4.9.  On Nov 02, 2007, Dr. Daleen Squibb who  was consulted was asked to decide about continued monitoring.  Dr. Darnelle Catalan  discontinued her Prinivil and took her Lantus to 25 units subcu daily.  Hemoglobin A1c was ordered.  Telemetry was discontinued on Nov 02, 2007.  Later that  day, the Lantus was changed to 15 units subcu nightly.  It  was felt that she was able to be transferred to the floor.  There was an  order for type and cross and given 1 unit of blood, but this was halted  by Dr. Darene Lamer on Nov 02, 2007.  The next morning the patient was  transfused 2 units of packed cells by Dr. Valentino Hue orders.  Imdur was  discontinued on Nov 03, 2007.  The Lantus was then shifted back up to 25  units subcu daily.  The SSI was changed to insulin-resistant scale.  On  Nov 03, 2007, dressing was changed, showing the wound to be stable.  On  Nov 04, 2007, Lantus was increased to 30 units subcu daily, and 6 units  of NovoLog was added for meal coverage.  On Nov 05, 2007, it was felt  that her hemoglobin had dropped below 8, and she was typed and crossed  and was to get 1 unit of blood.  A repeat CBC revealed either some type  of lab error because the hemoglobin was 8.5.  The blood was then held.  She has had a rocky course with her glucoses.  On Nov 06, 2007, she had  the Lantus decreased to 20  units at bedtime.  She was started on Nu-Iron  150 mg b.i.d. and MiraLax 17 grams in 8 ounces of liquid x1 and then  daily p.r.n.  This was at 9 o'clock in the morning.  At 1402, the Lantus  was discontinued and the Amaryl was discontinued by Dr. Darnelle Catalan.  The IVs  were to be taken to saline lock if okay with Dr. Darnelle Catalan.  The PCA was  discontinued.  At this point, the remainder of her hospital course has  been uneventful otherwise.  Plan is for discharged on Nov 07, 2007  unless change in the patient's status.   EKG of Nov 01, 2007 showed normal sinus rhythm with left axis deviation  a right bundle branch block.  There was left ventricular hypertrophy  with QRS widening.  On September 26, 2007 revealed normal sinus rhythm, left  axis deviation, right bundle branch block.  There were no radiographic  studies on the chart at the time of this dictation.   LABORATORY DATA:  She was admitted with hemoglobin of 11.6, hematocrit  35.8%, white count 6400, platelets 229,000.  Discharge hemoglobin 8.0,  hematocrit 23.8%, white count 6700, platelets 159,000.  Blood gas of Nov 01, 2007 revealed pH of 7.395, PCO2 37.5, PO2 61, bicarb 23, PCO2 24,  base excess 2.0, O2 saturation 91.0%, and it was noted that it was a  venous sample according to the lab studies.  Preoperative sodium 137,  potassium 4.9, chloride 103, CO2 26, glucose 204, BUN 12, creatinine  0.96, bilirubin 0.6.  Alk phos 56, GOT 26, GPT 31, total protein 7.1,  albumin 3.6, calcium 9.1.  Discharge sodium 142, potassium 3.6, chloride  107, CO2 26, glucose 55, BUN 8, creatinine 0.81.  GFR greater than 60.  Bilirubin 0.9, alk phos 44, GOT 60, GPT 17, total protein 5.2, albumin  2.1, calcium 7.8.  Urinalysis revealed a few squamous, 3-6 whites, 0-2  reds, many bacteria.  Urine culture of Nov 01, 2007 revealed no growth.  Of Oct 29, 2007, it showed greater than 1000 colonies of multiple  bacterial morphotypes.   DISCHARGE INSTRUCTIONS:  She is to  be on a diabetic diet, a 60 gram  modified carb  diet.  Increase activity slowly.  Use her walker,  weightbearing as tolerated on the left.  No lifting or driving for 6  weeks.  She may shower after no drainage from the wound for 2 days.  Follow the blue total hip instruction sheet.  Genevieve Norlander for her home  health when she is discharged from SNF.   DISCHARGE MEDICATIONS:  1. Arixtra 2.5 mg, injecting 2.5 subcu at 8:00 a.m., last dose Nov 11, 2007.  2. At that time, begin 81 mg of aspirin on November 12, 2007.  3. Percocet 5/325 1-2 tabs every four hours as needed for pain.  4. Nu-Iron 150 mg p.o. b.i.d.  5. MiraLax 17 grams in 8 ounces daily p.r.n. constipation.  6. Detrol LA 4 mg daily.  7. Fluoxetine 40 mg daily.  8. Metformin 1000 mg b.i.d.  9. Vytorin 10/40 nightly.  10.Alprazolam 0.5 mg nightly.  11.Colace 100 mg daily.  12.__________ 20 mg b.i.d.  13.Ambien 10 mg p.o. nightly p.r.n.  14.Nitroglycerin sublingual 0.4 mg 1 tablet under the tongue every 5      minutes as needed for chest pain x3 and then call physician.   FOLLOW UP:  She will need followup with her family doctor for continued  care of glucoses.  She should be on CBGs q.i.d.  She will follow up with  Dr. Priscille Kluver on Tuesday, November 13, 2007 and will need to call for  appointment at [phone].   CONDITION ON DISCHARGE:  Improved condition.      Oris Drone Petrarca, P.A.-C.      Carlisle Beers. Rendall, M.D.  Electronically Signed    BDP/MEDQ  D:  11/06/2007  T:  11/06/2007  Job:  [id number]

## 2010-10-26 NOTE — Discharge Summary (Signed)
NAME:  Kristen [name], Kristen [name] NO.:  [id number]   MEDICAL RECORD NO.:  [id number]          PATIENT TYPE:  INP   LOCATION:  3705                         FACILITY:  [facility]   PHYSICIAN:  Rollene Rotunda, MD, FACCDATE OF BIRTH:  February 04, 1946   DATE OF ADMISSION:  12/01/2006  DATE OF DISCHARGE:                         DISCHARGE SUMMARY - REFERRING   DISCHARGE DIAGNOSES:  1. Chest discomfort of uncertain etiology.  2. Minimal plaquing without obstructive coronary artery disease and      normal left ventricular function by cardiac catheterization.  3. Normocytic anemia.  4. Hyperglycemia with poorly controlled diabetes as evidenced by      elevated hemoglobin A1c.  5. History as noted below.   PROCEDURES PERFORMED:  Cardiac catheterization December 04, 2006, by Dr. Bonnee Quin.   SUMMARY OF HISTORY:  Kristen [name] is a 65 year old female with history of  chronic chest discomfort presented to the emergency room with sharp,  left-sided chest discomfort radiating to the left upper extremity  associated with shortness of breath and lightheadedness.  She has also  noted that her glucoses have been 300.  She laid down and her discomfort  improved within a few minutes.  She states the last time she had chest  discomfort was in January.   Her medical history is notable for type 2 diabetes, GERD,  hyperlipidemia, depression, migraines, DJD, anemia, nephrolithiasis,  chronic chest discomfort with last catheterization in March 2006 showing  a 20% diagonal 2, 20% RCA.  Adenosine Myoview in January 2008 showed  mild anterior ischemia, possible breast attenuation, and hypertension.   LABORATORY DATA:  Admission H&H was 13.9 and 41.0, normal indices,  platelets 268, wbc's 8.8.  At time of discharge H&H was 10.3 and 31.5,  normal indices, platelets 214, wbc's 6.1.  PTT 34, PT 13.8.  D-dimer  less than 0.22.  I-STAT on admission showed a sodium of 140, potassium  4.4, BUN 15, creatinine 1.0.  At  the time of discharge sodium was 139,  potassium 4.2, BUN 15, creatinine 1.0, glucose 127.  Hemoglobin A1c was  elevated 7.1.  CK-MBs, relative indexes and troponins were within normal  limits x3.  Chest x-ray on admission did not show any acute abnormality.  EKG showed sinus bradycardia, normal sinus rhythm, right bundle-branch  block, nonspecific ST-T wave changes, first-degree AV block.   HOSPITAL COURSE:  Kristen [name] was admitted to [facility].  Overnight she continued to have chest discomfort.  However, she refused  sublingual nitroglycerin from the nursing staff because she felt that  her blood pressure would drop too low.  Overnight she ruled out for  myocardial infarction by enzymes and EKGs.  Dr. Gala Romney and the  patient felt that she should proceed to cardiac catheterization for  further evaluation of her continued symptoms.  June 23 Dr. Riley Kill  performed cardiac catheterization.  This showed a 30% circumflex with  normal LV function.  Dr. Riley Kill felt that her chest discomfort was not  cardiac related.  D-dimer was within normal limits.  Post sheath removal  and bedrest, catheterization site was intact and the patient  was  ambulating the halls without difficulty.  She had not had any further  chest discomfort and Dr. Antoine Poche on June 24 felt that she could be  discharged home.   DISPOSITION:  The patient is discharged home.  Her activities and wound  care are per supplemental discharge sheet.  She is asked to maintain a  low-sodium heart-healthy ADA diet.   She will continue on her preadmission medications.  These have not  changed.  These include:  1. Vytorin 10/40 q.h.s.  2. Pepcid 20 mg b.i.d.  3. Fluoxetine 40 mg daily.  4. She was asked to resume her metformin 1000 mg b.i.d. on the evening      of June 25.  5. Motrin as needed.  6. Isordil 30 mg daily.  7. Meclizine 25 mg q.8h. as needed.  8. Lortab 5/500 mg q.4h. as needed.  9. Detrol LA 4 mg daily.   10.Aspirin 81 daily.  11.Xanax 0.5 q.h.s.  12.Lisinopril 10 mg daily.  13.Glimepiride 4 mg daily.   She was asked to make bring all medications to all appointments.  She  will follow up with Dr. Daleen Squibb as needed and she was asked to call Dr.  Luciana Axe for a 1- to 2-week appointment to follow up on her anemia and  hyperglycemia.   Discharge time 25 minutes.      Joellyn Rued, PA-C      Rollene Rotunda, MD, [facility]  Electronically Signed    EW/MEDQ  D:  12/05/2006  T:  12/05/2006  Job:  [id number]   cc:   Fanny Dance. Rankins, M.D.  Thomas C. Wall, MD, [facility]

## 2010-10-26 NOTE — Discharge Summary (Signed)
NAME:  HATLEY, HENEGAR NO.:  [id number]   MEDICAL RECORD NO.:  [id number]          PATIENT TYPE:  INP   LOCATION:  2021                         FACILITY:  [facility]   PHYSICIAN:  Arturo Morton. Riley Kill, MD, FACCDATE OF BIRTH:  1945-11-21   DATE OF ADMISSION:  02/02/2009  DATE OF DISCHARGE:  02/05/2009                               DISCHARGE SUMMARY   PRIMARY CARDIOLOGIST:  Maisie Fus C. Daleen Squibb, MD, [place]   PRIMARY CARE Alexey Rhoads:  Dr. Hyacinth Meeker   DISCHARGE DIAGNOSIS:  Chest pain.   SECONDARY DIAGNOSES:  1. Non-obstructive coronary artery disease by catheterization this      admission.  2. Hypertension.  3. Hyperlipidemia.  4. Type II diabetes mellitus.  5. Anemia.  6. Gastroesophageal reflux disease.  7. History of osteoarthritis, status post left total hip arthroplasty.  8. Obesity.  9. Depression, anxiety.   ALLERGIES:  SULFA causes hives.   PROCEDURES:  Left heart cardiac catheterization revealing nonobstructive  coronary artery disease with normal LV function.   HISTORY OF PRESENT ILLNESS:  A 65 year old Caucasian female with prior  history of nonobstructive CAD.  She was in her usual state of health  until the morning of admission when she awoke with left-sided chest  discomfort with radiation to the left shoulder and down the left arm.  She noted nausea and tachy palpitations as well as mild shortness of  breath.  Symptoms waxed and waned all morning and she subsequently  presented to the [facility] ED where she was given 2 sublingual  nitroglycerin and IV morphine with some relief.  The patient admitted  for further evaluation.   HOSPITAL COURSE:  The patient ruled out for MI.  Her cath films of 2008  were reviewed and decision was made to pursue repeat catheterization.  Catheterization took place on February 04, 2009, revealing nonobstructive  CAD and normal LV function.  She has had some reproducible mild chest  discomfort along her left sternal border, which has  been treated with  Tylenol.  We feel that her pain is likely musculoskeletal in origin.  Her D-dimer has been normal.  The patient will be discharged home today  in good condition.   DISCHARGE LABORATORY DATA:  Hemoglobin 10.9, hematocrit 33.2, WBC 76,  platelets 203.  D-dimer less than 0.22.  Sodium 139, potassium 4.3,  chloride 103, CO2 of 30, BUN 13, creatinine 0.95, glucose 140, total  bilirubin 0.5, alkaline phosphatase 56, AST 29, ALT 31, total protein  7.3, albumin 3.5, calcium 9.0, CK 44, MB 1.4, troponin-I 0.02.   DISPOSITION:  The patient will be discharged home today in good  condition.   FOLLOWUP PLANS AND APPOINTMENTS:  The patient will follow up with Dr.  Daleen Squibb on February 20, 2009, at 11:45 a.m.  She has to follow up with Dr.  Hyacinth Meeker as previously scheduled.   DISCHARGE MEDICATIONS:  1. Tylenol 650 mg q.4 h. p.r.n. musculoskeletal chest pain.  2. Lisinopril 20 mg daily.  3. Actos 15 mg daily.  4. Aspirin 81 mg daily.  5. Bisacodyl 5 mg daily.  6. Ditropan XL 10 mg daily.  7. Fioricet 2 tablets q.i.d. p.r.n.  8. Fluoxetine 20 mg 3 tabs daily.  9. Hydrocodone/APAP 5/500 one-two tabs q. 4-6 hours p.r.n. back pain.  10.Lantus 46 units subcu nightly.  11.Metformin 1000 mg b.i.d. to be resumed February 06, 2009.  12.MiraLax 1 capsule daily p.r.n.  13.Nasonex 1 spray each nostril daily.  14.Nitroglycerin 0.4 mg sublingual p.r.n. chest pain.  15.NovoLog insulin 10 units t.i.d. with meals.  16.Omega-3 fatty acids 1 capsule daily.  17.Vytorin 10/40 mg daily.  18.Hemorrhoidal cream over-the-counter as p.r.n.   OUTSTANDING LABS/STUDIES:  None.   DURATION OF DISCHARGE/ENCOUNTER:  Sixty minutes including physician  time.      Nicolasa Ducking, ANP      Arturo Morton. Riley Kill, MD, [facility]  Electronically Signed    CB/MEDQ  D:  02/05/2009  T:  02/06/2009  Job:  [id number]   cc:   Dr. Hyacinth Meeker

## 2010-10-26 NOTE — Consult Note (Signed)
NAME:  Kristen [name], Kristen [name] NO.:  [id number]   MEDICAL RECORD NO.:  [id number]          PATIENT TYPE:  INP   LOCATION:  1426                         FACILITY:  [facility]   PHYSICIAN:  Hillery Aldo, M.D.   DATE OF BIRTH:  03-20-1946   DATE OF CONSULTATION:  11/02/2007  DATE OF DISCHARGE:                                 CONSULTATION   PRIMARY CARE PHYSICIAN:  Dr. Luciana Axe.   CARDIOLOGIST:  Dr. Daleen Squibb.   ORTHOPEDIC SURGEON:  Dr. Priscille Kluver.   REASON FOR CONSULTATION:  Management of diabetes, hypertension,  hyperkalemia.   HISTORY OF PRESENT ILLNESS:  The patient is a 66 year old female who was  admitted on Nov 01, 2007, for an elective left total hip replacement due  to end-stage degenerative disease.  She was cleared by her cardiologist  prior to the surgery.  On postoperative day #1, the patient was noted to  have a potassium value of 6.5 with no visible homolysis.  A repeat of  her potassium level showed it came down to 5.2.  The patient is  currently being monitored on telemetry and has been in normal sinus  rhythm.  Internal medicine consultation was asked to weigh in on the  patient's diabetes, hypertension, hyperkalemia and other chronic medical  problems.   PAST MEDICAL HISTORY:  1. Nonobstructive coronary disease by cardiac catheterization June      2008 showing a 30% circumflex plaque.  Ejection fraction 70%.  2. Diabetes.  3. Hyperlipidemia.  4. Chronic angina.  5. Hypertension.  6. Depression/anxiety.  7. Migraine headaches.  8. Anemia.  9. Degenerative disk disease/degenerative joint disease.  10.History of left ureterocele and nephrolithiasis.  11.Gastroesophageal reflux disease.  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  13.History of urge incontinence.   PAST SURGICAL HISTORY:  1. Cholecystectomy.  2. Back surgery x2.  3. Cesarean section.  4. Bilateral total knee replacement.  5. Carpal tunnel release.  6. Status post  cystoscopy and transurethral incision of left      ureterocele and stone extraction.  7. Status post excision of squamous cell carcinoma of the left hand.  8. Recent left total hip replacement.   FAMILY HISTORY:  The patient's mother died at 64 from cancer.  She also  had coronary disease and diabetes.  The patient's father died at 53 of  coronary disease.  He also was diabetic.  The patient has 4 deceased  siblings, all of whom died of cancer including cancer of the lung and  brain.  She has 2 siblings who are alive.  She has 1 healthy daughter.   SOCIAL HISTORY:  The patient is widowed.  She is a resident of [place].  [employer].  She is a lifelong nonsmoker.  No alcohol or drugs.  She  is disabled.   ALLERGIES:  SULFA CAUSES A RASH.   CURRENT MEDICATIONS:  1. Xanax 0.5 mg q.h.s.  2. Aspirin 81 mg daily.  3. Colace 100 mg b.i.d.  4. Vytorin daily.  5. Pepcid 20 mg b.i.d.  6. Prozac 40 mg daily.  7. Arixtra 2.5 mg  subcutaneously daily.  8. Lasix 10 mg x1 today.  9. Amaryl 4 mg q.a.c.  10.PCA Dilaudid.  11.Lantus insulin 15 units subcutaneously q.h.s.  12.Sliding scale insulin t.i.d.  13.Imdur 30 mg daily.  14.Lisinopril 5 mg daily.  15.Metformin 1000 mg b.i.d.  16.Senokot 1 tablet b.i.d.  17.Detrol 4 mg daily.   REVIEW OF SYSTEMS:  Comprehensive 14 point review of systems was  completed and was completely negative with the exception of  postoperative left-sided hip pain.   PHYSICAL EXAM:  VITAL SIGNS:  Temperature 98.6, blood pressure 93/54,  pulse 87, respirations 18, O2 saturation 93% on 2 liters.  Fingerstick  blood glucose is 179.  GENERAL:  Well-developed, well-nourished slightly obese female in no  acute distress.  HEENT:  Normocephalic, atraumatic.  PERRL.  EOMI.  Oropharynx is clear.  NECK:  Supple, no thyromegaly, lymphadenopathy, no jugular venous  distension.  CHEST:  Diminished at bases but otherwise clear bilaterally.  HEART:  Regular rate, rhythm.  No  murmurs, rubs, or gallops.  ABDOMEN:  Soft, nontender, nondistended with normoactive bowel sounds.  EXTREMITIES:  No clubbing, edema, or cyanosis.  SKIN:  Warm and dry.  No rashes.  NEUROLOGIC:  The patient is alert and oriented x3.  Cranial nerves 2-12  are intact.  Nonfocal.   DATA REVIEW:  White blood cell count is 9.3, hemoglobin 8.5, hematocrit  25.7, platelets 157.  Sodium is 136, potassium 6.5, and 5.2 on repeat.  Chloride is 107, bicarb 26, BUN 22, and creatinine 1.45.   ASSESSMENT/PLAN:  1. Hyperkalemia:  This is likely due to volume contraction in the      setting of ongoing use of an ACE inhibitor.  I would discontinue      the ACE inhibitor for now and monitor her potassium closely as you      are doing.  It is likely to decrease without any specific      intervention but if it does not, I would order Kayexalate 15-30      grams as needed.  2. Acute renal insufficiency:  The patient's baseline creatinine is      0.96 and creatinine status 1.45.  Again, this is likely due to use      of an ACE inhibitor in the postsurgical setting with hypotension      and volume shifts.  I would hold the ACE inhibitor and hydrate her      and watch her creatinine closely.  3. Diabetes:  The patient's fingerstick glucose is elevated and      suboptimally controlled.  At this point, I would increase her      Lantus from to 15 to 25 units subcutaneously daily.  I would also      check a hemoglobin A1c to determine her overall glycemic control.  4. Hypertension:  The patient is currently hypotensive.  Hold her ACE      and hydrate her as you are doing.  5. Nonobstructive coronary artery disease:  Management per her      cardiologist.  I would continue aspirin and Imdur as you are doing.  6. Hyperlipidemia:  I would continue the Vytorin as you are doing.  7. Gastroesophageal reflux disease:  I would continue the patient on      Pepcid.  8. Acute blood loss anemia:  The patient has had a 3 g  drop in her      hemoglobin likely secondary to surgical losses.  Agree with keeping  her hemoglobin above 10 and would transfuse to achieve this goal.      She has a history of angina and would benefit from a hemoglobin of      10.   Thank you for this consultation.  We will follow the patient with you.      Hillery Aldo, M.D.  Electronically Signed     CR/MEDQ  D:  11/02/2007  T:  11/02/2007  Job:  [id number]   cc:   Fanny Dance. Rankins, M.D.  Fax: [phone]   Jesse Sans. Wall, MD, FACC  [address]  [address]  [place]  [place] [address]   Carlisle Beers. Rendall, M.D.  Fax: [phone]

## 2010-10-26 NOTE — Cardiovascular Report (Signed)
NAME:  Kristen [name], Kristen [name] NO.:  [id number]   MEDICAL RECORD NO.:  [id number]          PATIENT TYPE:  INP   LOCATION:  2021                         FACILITY:  [facility]   PHYSICIAN:  Arturo Morton. Riley Kill, MD, FACCDATE OF BIRTH:  18-Jul-1945   DATE OF PROCEDURE:  DATE OF DISCHARGE:                            CARDIAC CATHETERIZATION   INDICATIONS:  [name] is a 65 year old who presents with some chest  discomfort.  She has had prior catheterization about 2 years ago, has  diabetes and nonobstructive disease.  Because of the severity of the  pain yesterday, she was started on nitroglycerin and heparin.  Her  enzymes were negative.  The current study was done to assess coronary  anatomy.  Risks, benefits, and alternatives were discussed with the  patient, and she agreed to proceed.   PROCEDURES:  1. Left heart catheterization.  2. Selective coronary arteriography.  3. Selective left ventriculography.   DESCRIPTION OF PROCEDURE:  Procedure was performed for the right femoral  artery using 5-French catheter.  She tolerated the procedure without  complication and was taken to the holding area in satisfactory clinical  condition.  We attempted to call the daughter, but received an answering  note.   She was taken to the holding area without complication.   HEMODYNAMIC DATA:  1. Central aortic pressure or 91/65, mean 77.  2. LV pressure was 99/12.  3. No gradient pullback across the aortic valve.   ANGIOGRAPHIC DATA:  1. Ventriculography in the RAO projection reveals vigorous global      systolic function.  No definite wall motion abnormalities were      seen.  EF was in excess of 60%.  There was no significant mitral      regurgitation.  2. The left main is free of critical disease.  3. The LAD courses to the apex.  The LAD has multiple areas of some      luminal irregularities.  In particular, there is about 40% area of      segmental plaquing in between the origins  of the first and second      diagonal.  The second diagonal ostium has segmental plaque of about      40%.  There is a fairly focal 50% lesion distally in the LAD before      the LAD wraps the apex and then the apical portion of the LAD has      some diffuse nonobstructive plaque.  4. The circumflex provides a large ramus intermedius which is free of      critical disease and an AV circumflex with 20-30% narrowing      proximally.  Vessels fairly large distally with some diffuse      luminal irregularity but not exceeding about 20-30% luminal      reduction.  5. The right coronary artery has about 30% proximal narrowing of 40-      50% mid lesion.  We did extra shots of this area to demonstrate the      lesion.  There is about distal vessel consistent with posterior  descending and posterolateral branch without critical narrowing.   CONCLUSION:  1. Scattered coronary abnormalities as described above without      critical stenosis.  2. Preserved left ventricular function.   DISPOSITION:  Aggressive medical therapy will be recommended.  She will  need close followup.  We will revise her medications appropriately prior  to discharge.      Arturo Morton. Riley Kill, MD, [facility]  Electronically Signed     TDS/MEDQ  D:  02/04/2009  T:  02/05/2009  Job:  [id number]   cc:   Thomas C. Wall, MD, [facility]

## 2010-10-29 NOTE — Discharge Summary (Signed)
NAME:  LAKEVIA, PERRIS NO.:  [id number]   MEDICAL RECORD NO.:  [id number]          PATIENT TYPE:  INP   LOCATION:  6527                         FACILITY:  [facility]   PHYSICIAN:  Thomas C. Wall, M.D.   DATE OF BIRTH:  1945-06-23   DATE OF ADMISSION:  11/28/2005  DATE OF DISCHARGE:  11/30/2005                                 DISCHARGE SUMMARY   PROCEDURES:  A 2-D echocardiogram.   PRIMARY DIAGNOSIS:  Chest pain.  Cardiac enzymes negative for myocardial  infarction, and echocardiogram negative for pericarditis, felt secondary to  musculoskeletal pain.   SECONDARY DIAGNOSES:  1.  Status post cardiac catheterization in 2006 with minimal coronary artery      disease and an ejection fraction of 65%.  2.  Diabetes.  3.  Gastroesophageal reflux disease.  4.  Hyperlipidemia.  5.  Depression.  6.  History of migraines.  7.  Degenerative joint disease.  8.  History of migraines.  9.  Degenerative joint disease.  10. History of urge incontinence.  11. Status post back surgery x2.  12. Status post bilateral knee replacement and carpal tunnel surgery on the      right.  13. Anemia.   FAMILY HISTORY:  Coronary artery disease.   ALLERGIES:  Allergy or intolerance to SULFA with a rash.   TIME AT DISCHARGE:  36 minutes.   HOSPITAL COURSE:  Ms. Kristen [name] is a 65 year old female with a previous  cardiac catheterization in 2006 showing a 20% stenosis of the second  diagonal and the RCA, but no other disease and a normal EF.  She is  currently residing in assisted living because of a history of frequent  falls.  She developed chest pain 3 days ago that was sharp and constant and  radiated from the back to her chest and to both arms.  She also had a  stabbing pain at times, as well, that was more severe.  She was admitted for  further evaluation and treatment.   Her cardiac enzymes were negative for MI.  She is mildly anemic with a  hemoglobin of 10.8 and hematocrit of 31.7,  but this was stable during her  hospital stay.  Her BUN and creatinine were within normal limits.  A lipid  profile was performed, which showed a total cholesterol of 103,  triglycerides 76, HDL 35, LDL 53.  A TSH was within normal limits at 0.947.   On November 30, 2005, Dr. Daleen Squibb felt that her pain was pleuritic.  An echo was  ordered to rule out pericarditis, but was negative.  He felt that no cardiac  follow up was necessary, and her Imdur could be decreased to 30 mg at day.  Dr. Daleen Squibb evaluated Ms. Pilger and considered her stable for discharge on  November 30, 2005.  She is to return to her assisted living.   DISCHARGE INSTRUCTIONS:  Her activity level is to be increased gradually.  She is to stick to a heart healthy diet.  She is to follow up with Dr. Daleen Squibb  as needed, and she is to call Dr.  Richter for an appointment to be evaluated  for non-cardiac causes of chest pain.   DISCHARGE MEDICATIONS:  1.  Aspirin 81 mg daily.  2.  Detrol LA 4 mg daily.  3.  Fluoxetine 40 mg daily.  4.  Amaryl 4 mg daily.  5.  Lisinopril 10 mg daily.  6.  Famotidine 20 mg b.i.d.  7.  Isosorbide mononitrate 30 mg daily.  8.  Metformin 1000 mg b.i.d.  9.  Dulcolax 5 mg two tablets q.h.s.  10. Vytorin 10/40 q.h.s.  11. Cheratussin syrup q.h.s. p.r.n.  12. Docusate 100 mg b.i.d. p.r.n.  13. Mucinex ER 1-2 tablets b.i.d. p.r.n.  14. Oxycodone 5/500 q.4h. p.r.n.  15. Lantus 15 units q.h.s.  16. Sliding scale insulin with Humulin R as prior to admission.      Theodore Demark, P.A. [facility]      Thomas C. Wall, M.D.  Electronically Signed    RB/MEDQ  D:  11/30/2005  T:  11/30/2005  Job:  [id number]   cc:   Clydie Braun L. Hal Hope, M.D.  Fax: [phone]

## 2010-10-29 NOTE — H&P (Signed)
NAME:  Kristen [name], Kristen [name]                         ACCOUNT NO.:  [id number]   MEDICAL RECORD NO.:  [id number]                   PATIENT TYPE:  INP   LOCATION:  2912                                 FACILITY:  [facility]   PHYSICIAN:  Charlies Constable, M.D. [facility]              DATE OF BIRTH:  09-30-1945   DATE OF ADMISSION:  10/02/2003  DATE OF DISCHARGE:                                HISTORY & PHYSICAL   PRIMARY CARE PHYSICIAN:  Turkey R. Rankins, M.D. at [facility]   CARDIOLOGIST:  Jesse Sans. Wall, M.D.   CHIEF COMPLAINT:  Chest pain.   HISTORY OF PRESENT ILLNESS:  The patient is a 64 year old female with a  history of non-obstructive coronary artery disease by a catheterization in  2001.  She has been having chest pain and saw Dr. Daleen Squibb in the office on  September 22, 2003.  He arranged an outpatient Cardiolite which was performed on  September 30, 2003.  The outpatient Cardiolite showed no scar or ischemia and an  ejection fraction of 68%; however, her pain has persisted and has been  continuous since 10 a.m. today at an 8-9/10.  The patient was pain-free when she went to bed last night, but this morning  at approximately 10 a.m. woke up with 9/10 chest pain.  This is described as  substernal, radiating to her left shoulder and neck.  She has some  associated shortness of breath, but no nausea or diaphoresis with this.  She  took two sublingual nitroglycerin and stated that they gave her a headache,  but did not change her pain significantly.  She has a pressure sensation, as  well as a sharp pain.  The sharp pain does get worse with deep inspiration  or cough.  She did not try any other medications for her pain.   PAST MEDICAL HISTORY:  1. Status post cardiac catheterization in November 2001, with a 30% left     main, a 30% LAD, a 20% stenosis in the distal circumflex and a 30%     stenosis in the RCA.  She had normal left ventricular systolic function     with mild MR.  Medical therapy was  recommended.  2. Diabetes.  3. Gastroesophageal reflux disease symptoms.  4. History of being nervous.  5. Hyperlipidemia, diagnosed on September 30, 2003.  6. Obesity.  7. Family history of coronary artery disease in both parents.   PAST SURGICAL HISTORY:  1. Bilateral knee surgery.  2. Back surgery x2.   FAMILY HISTORY:  Her father died at age 37, and had coronary artery disease.  Her mother died at age 37, and had coronary artery disease, but died of  cancer.  She has siblings - 09-30-22 have died of cancer and the two remaining  siblings have no known coronary artery disease.   SOCIAL HISTORY:  She lives in [place], [place].  She is  disabled.  She  does not drink or smoke.  She is on Medicaid.   ALLERGIES:  SULFA.   MEDICATIONS:  1. Prozac 20 mg daily.  2. Pepcid 20 mg b.i.d.  3. Glucophage 1000 mg b.i.d.  4. Aspirin 81 mg daily, which has been held for one week, secondary to some     upcoming surgery for a skin cancer on her right hand.   REVIEW OF SYSTEMS:  Significant for chronic dyspnea on exertion and chest  pain as described above.  She denies any current reflux symptoms.  She  denies any recent fevers or chills, and there is no recent history of an  upper respiratory infection.  She has no history of thyroid disease.  The  review of systems is otherwise negative.   PHYSICAL EXAMINATION:  VITAL SIGNS:  Temperature 98.4 degrees, blood  pressure 133/80, pulse 84, respirations 20, O2 saturation 95% on room air.  GENERAL:  She is a well-developed, obese, elderly white female, in no acute  distress.  She looks older than her stated years.  HEENT:  Head normocephalic, atraumatic.  Pupils equal, round, reactive to  light and accommodation.  Extraocular movements intact.  Ears are without  discharge.  NECK:  Carotid upstrokes are equal bilaterally.  No carotid bruits are  appreciated.  There is no jugular venous distention, no thyromegaly, and no  lymphadenopathy is  appreciated.  CARDIOVASCULAR:  Her heart is regular in rate and rhythm with a soft 1-2/6  systolic ejection murmur.  There is no rub noted.  Distal pulses 2+ in all  four extremities, and no femoral bruits are appreciated.  LUNGS:  A very few rales in the bases, but are generally clear.  ABDOMEN:  Soft, and there is a slight tenderness at the left lower rib  border, as well as at the lower sternal border.  Bowel sounds active, and no  hepatosplenomegaly is appreciated.  EXTREMITIES:  There is no clubbing, cyanosis, or edema.  No petechiae or  rash appreciated.  MUSCULOSKELETAL:  There is no joint deformities or effusions, and no spine  or CVA tenderness.  NEUROLOGIC:  She is alert and oriented.  Cranial nerves  II-XII are grossly intact.  No focal deficits are appreciated.  A chest x-ray is pending.  Electrocardiogram:  Shows a right bundle branch block, as well as a left  anterior fascicular block.  She has left ventricular hypertrophy with QRS  widening.  There are some minor differences from the electrocardiogram  performed in the office, but no acute ischemic changes.   LABORATORY DATA:  Values are pending at the time of dictation.   ASSESSMENT/PLAN:  1. Chest pain:  Although she had a recent negative Cardiolite, she does have     multiple risk factors for heart disease, and her symptoms are concerning.     We will admit her and add IV heparin and nitroglycerin, and cycle     enzymes.  She will be scheduled for a cardiac catheterization on October 03, 2003.  She will be continued on her home medications, with the     exception of holding Glucophage.  2. Hyperlipidemia:  We will add Lipitor 20 mg daily, which she can obtain     through Medicaid.  Follow up the liver function tests and the cholesterol     testing in six to 12 weeks.  3. Diabetes:  We will hold her Glucophage and use sliding scale insulin.    Further medication changes plus/minus checking  a hemoglobin A1c will be      per the M.D.  The patient is to be continued on her other medications.  Dr. Charlies Constable saw the patient and determined the plan of care.      Theodore Demark, P.A. [facility]                  Charlies Constable, M.D. [facility]    RB/MEDQ  D:  10/02/2003  T:  10/04/2003  Job:  [id number]

## 2010-10-29 NOTE — Discharge Summary (Signed)
[facility]  Patient:    Kristen [name], Kristen [name]                        MRN: [id number] Adm. Date:  [id number] Disc. Date: [id number] Attending:  Dolores Patty CC:         [facility]  Titus Dubin. Alwyn Ren, M.D. [facility]   Discharge Summary  DISCHARGE DIAGNOSES: 1. Chest pain, felt noncardiac. 2. Diabetes mellitus, uncontrolled.  CONSULTATIONS:  Cardiology.  PROCEDURES:  Cardiac catheterization April 18, 2000, per [facility], Dr. Jens Som, with coronary artery disease, specifically mid 30% left main, mid 30% LAD, distal 30% circumflex, proximal 30% and mid 30% RCA, normal LV ejection fraction and mild MR, mild nonobstructive coronary artery disease overall.  HISTORY & PHYSICAL:  Please see that dictated on date of admission by Dr. Marga Melnick.  HOSPITAL COURSE:  Ms. Shimer if a 66 year old white female admitted with recurrent chest discomfort, typical it seemed for angina.  She was placed on telemetry, remained in sinus.  CPK and troponin I were negative x 2. Hemoglobin 13.0.  Electrolytes within normal limits.  BUN and creatinine 11 and 0.8.  Of note, hemoglobin A1C was 9.0.  Given her history, she was taken directly to cardiac catheterization for definitive evaluation of the anatomy with results as above.  Of note also was a negative Cardiolite in September 2001.  Chest discomfort was felt to be noncardiac, and she was referred for medical therapy only.  DISPOSITION:  Discharge to home in good condition.  DISCHARGE MEDICATIONS: 1. Darvocet b.i.d. p.r.n. for presumed musculoskeletal discomfort. 2. Actos 30 mg p.o. q.d. for uncontrolled diabetes.  Home medications on admission including: 1. Prozac 20 mg p.o. q.d. 3. Ecotrin 325 mg p.o. q.d. 3. Protonix 40 mg p.o. q.d. 4. Lopressor 25 mg p.o. q.d.  DIET:  1800 calorie ADA diet.  ACTIVITY:  No restrictions.  FOLLOWUP:  Follow up with [facility], primary care physician within three weeks specifically for diabetes. DD:  04/18/00 TD:  04/19/00 Job: [id number]

## 2010-10-29 NOTE — Cardiovascular Report (Signed)
NAME:  Kristen [name], Kristen [name] NO.:  [id number]   MEDICAL RECORD NO.:  [id number]          PATIENT TYPE:  INP   LOCATION:  3734                         FACILITY:  [facility]   PHYSICIAN:  Vida Roller, M.D.   DATE OF BIRTH:  [birth year]   DATE OF PROCEDURE:  08/18/2004  DATE OF DISCHARGE:                              CARDIAC CATHETERIZATION   PRIMARY:  Dr. Barbaraann Barthel at [place].   CARDIOLOGIST:  Jesse Sans. Wall, M.D.   HISTORY OF PRESENT ILLNESS:  Kristen [name] is a 65 year old woman who has  known nonobstructive coronary disease and a heart catheterization done a few  years ago.  Comes in with chest discomfort with rest pain and was taken to  the catheterization laboratory to ensure that she did not have significant  coronary disease.   DETAILS OF THE PROCEDURE:  After obtained informed consent, the patient was  brought to the cardiac catheterization laboratory in the fasting state.  There, she was prepped and draped in the usual sterile manner, and a local  anesthetic was obtained over the right groin using 1% lidocaine without  epinephrine.  The right femoral artery was cannulated using the modified  Seldinger technique with a 6 French 10 cm sheath, and left heart  catheterization was performed using a 6 French Judkins right #4, a 6 French  Judkins left #4, and a 6 French pigtail catheter.  The pigtail catheter was  used for left ventriculogram which was performed in the 30-degree RAO view.  At the conclusion of the procedure, the catheters were removed.  The patient  was moved back to the cardiology holding area, where the femoral artery  sheath was removed.  Hemostasis was obtained using direct manual pressure.  At the conclusion of the hold, there was no evidence of ecchymosis or  hematoma formation, and distal pulses were intact.  Total fluoroscopic time  2.4 minutes.  Total ionized contrast 120 cc.   RESULTS:  Aortic pressure 115/69, with a mean arterial pressure of  87 mmHg.   Left ventricular pressure 114/14, with an end-diastolic pressure of 26 mmHg.   Coronary angiography:  The left main coronary artery is a moderate-caliber  vessel which is angiographically free of disease.   The left anterior descending coronary artery is a large transapical vessel  which has three diagonal branches, the second of which has a 20% stenosis.   The circumflex coronary artery is a small nondominant vessel which has no  significant disease.   The right coronary artery is a large dominant vessel which has a 20%  stenosis at its ostial portion.   Left ventriculogram reveals a systolic ejection fraction of 19%, with no  wall motion abnormalities and no mitral regurgitation.   ASSESSMENT:  1.  Nonobstructive coronary disease.  2.  Normal left ventricular systolic function.  3.  Noncardiac chest pain.   RECOMMENDATIONS:  Medical therapy for risk factor modification.  Discharge  in the morning.      JH/MEDQ  D:  08/18/2004  T:  08/19/2004  Job:  [id number]

## 2010-10-29 NOTE — Cardiovascular Report (Signed)
NAME:  Kristen [name], GHAZARIAN                         ACCOUNT NO.:  [id number]   MEDICAL RECORD NO.:  [id number]                   PATIENT TYPE:  INP   LOCATION:  3727                                 FACILITY:  [facility]   PHYSICIAN:  Carole Binning, M.D. [facility]         DATE OF BIRTH:  1945/08/14   DATE OF PROCEDURE:  10/03/2003  DATE OF DISCHARGE:                              CARDIAC CATHETERIZATION   PROCEDURE PERFORMED:  Intravascular ultrasound of the left anterior  descending artery.   CARDIOLOGIST:  Carole Binning, M.D.   INDICATIONS:  Ms. Bragdon is a 65 year old woman with chest pain.  Cardiac  catheterization performed by Dr. Eden Emms revealed moderate, but  nonobstructive coronary artery disease.  She was enrolled in the  Stradivarius trial.  As part of this trial a baseline intravascular  ultrasound was performed.   PROCEDURAL NOTE:  We utilized the preexisting 6 French sheath in the right  femoral artery.  Heparin was administered to maintain an ACT of greater than  200 seconds.  We used a 6 [place] JL-4 guiding catheter.  A Hi-Torque floppy  wire was advanced under fluoroscopic guidance into the distal LAD.  Intracoronary nitroglycerin was administered prior to the intravascular  ultrasound.  An intravascular ultrasound catheter was advanced over the wire  and positioned in the mid LAD.  Intravascular ultrasound images were  obtained and recorded on automatic pullback.   At the conclusion of the procedure the IVUS catheter and guidewire were  removed.   Final angiographic images revealed the LAD to remain patent with no evidence  of vessel damage.   COMPLICATIONS:  None.   RESULTS:  Intravascular ultrasound of the LAD revealed moderate diffuse  atherosclerotic disease of the proximal LAD with an area of significant  calcification; however, the plaque did not appear to be obstructive in  nature.   PLAN:  The patient will be managed medically and treated per study  protocol.                                               Carole Binning, M.D. [facility]    MWP/MEDQ  D:  10/03/2003  T:  10/04/2003  Job:  [id number]   cc:   Fanny Dance. Rankins, M.D.  [address]  [place]  [place] [address]  Fax: [phone]   Jesse Sans. Wall, M.D.   Catheterization Laboratory   [facility]

## 2010-10-29 NOTE — Discharge Summary (Signed)
NAME:  Kristen [name], KEESEY                         ACCOUNT NO.:  [id number]   MEDICAL RECORD NO.:  [id number]                   PATIENT TYPE:  INP   LOCATION:  3727                                 FACILITY:  [facility]   PHYSICIAN:  Charlies Constable, M.D. [facility]              DATE OF BIRTH:  September 11, 1945   DATE OF ADMISSION:  10/02/2003  DATE OF DISCHARGE:  10/04/2003                                 DISCHARGE SUMMARY   PROCEDURES:  Cardiac catheterization April 22.   REASON FOR ADMISSION:  Please refer to dictated admission note.   LABORATORY DATA:  Normal serial cardiac markers, D-Dimer 1.49, TSH 0.57.  Normal CBC on admission.  Normal electrolytes and renal function. Glucose  305 on admission, hemoglobin A1c 8.5.   Chest CT scan:  Negative for pulmonary embolus.   HOSPITAL COURSE:  The patient ruled out for myocardial infarction with all  serial cardiac markers within normal limits.  D-Dimer was elevated, and the  patient was referred for a followup CT scan of the chest.  This revealed no  evidence of pulmonary embolus.   Given this finding, the patient was cleared to proceed with diagnostic  coronary angiography to exclude a significant CAD progression since her  previous catheterization of 2001 which revealed nonobstructive CAD.   Cardiac catheterization, however, revealed nonobstructive coronary artery  disease, and normal left ventricular function.  Specifically, there was 20%  ostial LMCA; 30% distal LAD; 30% first diagonal - second diagonal; normal  circumflex; 30% proximal mid RCA.   The patient was referred to Dr. Emilie Rutter. Pulsipher for consideration of  Stradivarius study.  Dr. Gerri Spore employed IVUS of the LAD noting  nonobstructive diffuse disease.   The patient was kept for overnight observation, cleared for discharge the  following morning, and hemodynamic stable condition.   MEDICATION ADJUSTMENTS THIS ADMISSION:  Additional Lipitor.   DISCHARGE MEDICATIONS:  1. Lipitor 40  mg daily (new).  2. Coated aspirin 81 mg daily.  3. Prozac 20 mg daily.  4. Famotidine 20 mg b.i.d.  5. Glucophage 1000 mg b.i.d.  6. Nitrostat 0.4 mg p.r.n.   DISCHARGE INSTRUCTIONS:  1. No heavy lifting/ driving x2 days.  2. Maintain low-fat, low-cholesterol and diabetic diet; call the cardiology     office if there is any swelling or bleeding of the groin.  3. The patient is instructed to arrange followup with Dr. Fanny Dance.     Rankins, on Thursday, April 28.   DISCHARGE DIAGNOSES:  1. Nonischemic chest pain.     A. Normal serial cardiac markers.     B. Nonobstructive coronary artery disease/ normal left ventricular        function.  Cardiac catheterization April 22.  2. Type 2 diabetes mellitus.  3. Dyslipidemia.  4. Right bundle branch block.      Stradivarius      Gene Serpe, P.A. [facility]  Charlies Constable, M.D. [facility]    GS/MEDQ  D:  10/04/2003  T:  10/05/2003  Job:  [id number]   cc:   Fanny Dance. Rankins, M.D.  [address]  [place]  [place] [address]  Fax: [phone]

## 2010-10-29 NOTE — Discharge Summary (Signed)
[facility]  Patient:    Kristen [name], Kristen [name]                        MRN: [id number] Adm. Date:  [id number] Disc. Date: 12/04/00 Attending:  Dolores Patty CC:         Titus Dubin. Alwyn Ren, M.D. [facility]   Discharge Summary  DISCHARGE DIAGNOSES: 1. Abdominal pain with diarrhea. 2. Diabetes. 3. History of cholecystectomy.  DISCHARGE MEDICATIONS: 1. Zantac 150 mg p.o. b.i.d. 2. _____  p.r.n. 3. Lorazepam 1 mg p.o. q.d. p.r.n. 4. Sarafem 20 mg p.o. q.d. 5. Metformin 500 mg p.o. b.i.d.  CONDITION ON DISCHARGE:  Improved.  FOLLOWUP PLANS:  Dr. Alwyn Ren in one to two weeks.  HOSPITAL PROCEDURES: 1. CT scan of the abdomen and pelvis demonstrated marked fatty infiltration of    the liver. 2. CT scan of the pelvis demonstrated a 1 cm calcification near the UVJ. 3. Acute abdominal series demonstrated a benign appearing abdomen and chest.    There is no ileus or obstruction.  HOSPITAL LABORATORY DATA:  Urine culture negative.  CBC was normal with a white count of 7.9 and a hemoglobin of 12.9.  CMET was normal, except for a glucose of 134.  Amylase was 15 (normal 27 to 131), lipase was 16 (normal 22 to 51).  HOSPITAL COURSE:  The patient was admitted to the hospital service on December 01, 2000.  See Dr. Caryl Never admission note for details.  She was admitted for abdominal pain and diarrhea.  Those symptoms quickly resolved in the hospital.  CT scans of the abdomen and pelvis were performed, as above, with no etiology identified.  It is likely that she had a viral gastroenteritis which has resolved.  If her symptoms recur or persist, she is to see Dr. Alwyn Ren and consider GI consultation.  This was discussed with her at the time of discharge.  ID:  The patient did have an abnormal UA at the office and was empirically treated with Cipro.  She will complete one more day of that as an outpatient. Her cultures have remained negative. DD:  12/04/00 TD:  12/04/00 Job:  [id number]

## 2010-10-29 NOTE — Cardiovascular Report (Signed)
NAME:  Kristen [name], Kristen [name]                         ACCOUNT NO.:  [id number]   MEDICAL RECORD NO.:  [id number]                   PATIENT TYPE:  INP   LOCATION:  3727                                 FACILITY:  [facility]   PHYSICIAN:  Charlton Haws, M.D.                  DATE OF BIRTH:  08-Feb-1946   DATE OF PROCEDURE:  DATE OF DISCHARGE:                              CARDIAC CATHETERIZATION   PROCEDURE PERFORMED:  Arteriography.   CARDIOLOGIST:  Charlton Haws, M.D.   INDICATIONS:  Recurring chest pain requiring hospitalization.   DESCRIPTION OF PROCEDURE:  Standard catheterization was done with 6 French  catheters in the right femoral artery.   Left Main Coronary Artery:  The left main coronary artery had a 20% ostial  stenosis.   Left Anterior Descending Artery:  The left anterior descending artery had  30% multiple discrete lesions in the proximal and distal vessel.  The first  diagonal branch had 30% multiple discrete lesions.  The second diagonal  branch had 30% multiple discrete lesions.   Circumflex Coronary Artery:  The circumflex coronary artery was nondominant  and normal.   Right Coronary Artery: The right coronary artery was dominant.  There was  30% tubular disease in the proximal and midvessel.  The distal vessel was  normal.   VENTRICULOGRAPHIC DATA:  RAO ventriculography showed normal ejection  fraction with an EF of 60%.  There is no gradient across the aortic valve  and no MR.  Aortic pressure was in the 117/83 range.  LV pressure was in the  120/18 range.  ACT at the end of the case was 159.   IMPRESSION:  The patient does not have significant coronary artery disease.  She was entered into the Stradivarius per our research institute.  Dr.  Gerri Spore will proceed with intravascular ultrasound of the left anterior  descending.   The patient will be discharged in the morning as long as her leg heals well.                                               Charlton Haws,  M.D.    PN/MEDQ  D:  10/03/2003  T:  10/04/2003  Job:  [id number]   cc:   Fanny Dance. Rankins, M.D.  [address]  [place]  [place] [address]  Fax: [phone]   Jesse Sans. Wall, M.D.

## 2010-10-29 NOTE — Assessment & Plan Note (Signed)
[facility]                            CARDIOLOGY OFFICE NOTE   NAME:Kristen [name]                      MRN:          [id number]  DATE:06/07/2006                            DOB:          Feb 09, 1946    Kristen [name] returns today for further management of her nonobstructive  coronary disease.  She is now living at [facility]  because of falls and chronic dizziness.   See the note from Kristen [name], Nov 03, 2005, for dizziness.  She has  had some chest tightness, going into her left arm and hand.  This  happened on Sunday night, requiring one nitroglycerin.  She has not had  objective assessment of her coronary disease since September 30, 2003, which  showed an EF of 68% with no ischemia.   MEDICATIONS:  1. Alprazolam 0.5 mg a day.  2. Aspirin 81 mg a day.  3. Detrol 4 mg a day.  4. Glimepiride 4 mg daily.  5. Famotidine 20 mg p.o. b.i.d.  6. Lisinopril 10 mg a day.  7. Metformin 1000 b.i.d.  8. Bisacodyl 5 mg p.o. b.i.d.  9. Vytorin 10/40 daily.  10.Isosorbide 30 mg a day.  11.Lantus.  12.Fluoxetine 40 mg a day.   Her general medical care is provided by Kristen [name].   PHYSICAL EXAMINATION:  She is in no acute distress.  Her blood pressure  is 118/68, her pulse is 66 and regular, her weight is 227.  HEENT:  Her skin is pale and dry.  Her face and neck is positive for  hirsutism.  PERLA.  Extraocular movements intact.  Sclerae clear.  Facial symmetry is normal.  NECK:  Carotid upstrokes are equal bilaterally, without bruits.  There  is no JVD.  Thyroid is not enlarged.  LUNGS:  Clear.  HEART:  Reveals soft S1, S2.  PMI could not be appreciated.  Pulses in  the upper extremities are normal.  There is no edema.  ABDOMINAL EXAM:  Obese with good bowel sounds.  Organomegaly cannot be  assessed.  EXTREMITIES:  Reveal trace edema.  Pulses are present.  NEUROLOGIC EXAM:  Intact.  SKIN:  Otherwise is intact.   EKG:  Shows  sinus rhythm, left axis deviation, right bundle, left  ventricular hypertrophy.   I have had a long talk with Kristen [name] today.  With one episode of  chest discomfort, which is fairly classic for angina, requiring  nitroglycerin, I think an objective assessment of coronary disease is  necessary.  I have set up for her to have an adenosine Myoview.  Assuming this is negative for ischemia, I will see her back in a year.  No changes were made in her medications.     Thomas C. Daleen Squibb, MD, [facility]  Electronically Signed    TCW/MedQ  DD: 06/07/2006  DT: 06/07/2006  Job #: [id number]   cc:   Kristen [name]. Kristen [name], M.D.

## 2010-10-29 NOTE — H&P (Signed)
NAME:  PAULETTA, PICKNEY NO.:  [id number]   MEDICAL RECORD NO.:  [id number]          PATIENT TYPE:  INP   LOCATION:  6527                         FACILITY:  [facility]   PHYSICIAN:  Thomas C. Wall, M.D.   DATE OF BIRTH:  1945/08/21   DATE OF ADMISSION:  11/28/2005  DATE OF DISCHARGE:                                HISTORY & PHYSICAL   CHIEF COMPLAINT:  Chest and back pain.   HISTORY OF PRESENT ILLNESS:  This is a 65 year old Caucasian woman with a  history of mild obstructive coronary artery disease, who presents with 1-1/2  days of sharp, constant mid-back pain radiating to both arms.  Her symptoms  are identical to 2 prior hospitalizations, in March of 2006 and April of  2005, for her lung/anginal symptoms.  During those 2 times, she was  cathed, demonstrating mild nonobstructive disease.  During the episode that  prompted this admission, the patient noted a sharp stabbing pain radiating  to both arms with some mild shortness of breath and some mild radiation to  the anterior chest.  There was no relief with position or with  nitroglycerin.  The patient did note a pleuritic component to the symptoms.  She denies any fevers, chills or sick contacts, but does note chronic  orthopnea.  She denied any syncope or tearing sensation in the back.   PAST MEDICAL HISTORY:  Past medical history is notable for:  1.  Atypical chest pain/noncardiac chest pain.      1.  Cath in April of 2005 showed mild nonobstructive coronary artery          disease.      2.  Cath in March of 2006 demonstrated a 20% second diagonal, 20% ostial          RCA and an EF of 65%.  2.  Diabetes mellitus, type 2.  3.  GERD.  4.  Hyperlipidemia.  5.  Back surgery x2.  6.  Depression.  7.  Migraines.  8.  Urge incontinence.  9.  Degenerative joint disease.   ALLERGIES:  SULFA, causing rash.   MEDICATIONS:  1.  Glucophage 1000 mg twice a day.  2.  Amaryl 4 mg daily.  3.  Lisinopril 10 mg daily.  4.  Vytorin 10/40 mg nightly.  5.  Detrol LA 4 mg daily.  6.  Prozac 40 mg daily.  7.  Lantus 50 units subcu daily.  8.  Prevacid 20 mg b.i.d. p.o.  9.  Imdur 30 mg p.o. b.i.d.  10. Aspirin 81 mg daily.   SOCIAL HISTORY:  The patient lives in a [facility] nursing home.  She is  disabled secondary to multiple falls.  She denies any history of tobacco,  alcohol or drug use.   FAMILY HISTORY:  Family history is notable for a mother who died at age 40  secondary to heart disease and a father who expired at age 43 secondary to a  large MI.  No siblings have coronary artery disease.   REVIEW OF SYSTEMS:  Review of systems demonstrates frequent falls,  noncardiac chest pain  and low back pain.  The rest of the 12-point review of  systems was reviewed and is negative.   PHYSICAL EXAMINATION:  VITAL SIGNS:  Pulse is 76, blood pressure 100/60,  respiratory rate is 16, saturating 97% on 2 L.  GENERAL:  The patient is awake, alert, oriented x3, in no acute distress.  HEENT:  Normocephalic, atraumatic.  Pupils are equal, round and reactive to  light.  Extraocular movements are intact.  The patient is edentulous.  NECK:  No JVD.  CARDIOVASCULAR:  Regular rhythm, normal rate, no murmurs, rubs, or gallops.  Normal S1 and S2.  LUNGS:  Clear to auscultation bilaterally with diffusely decreased breath  sounds.  ABDOMEN:  Positive bowel sounds.  Obese, soft, nontender and non-distended.  EXTREMITIES:  No cyanosis, clubbing or edema with 2+ bilateral upper and  lower extremities distal pulses.  NEUROLOGIC:  Cranial nerves II-XII are grossly intact.  No focal  musculoskeletal or sensory deficits.  PSYCHIATRIC:  Flat affect.  MUSCULOSKELETAL:  No tenderness.   LABORATORY DATA:  Labs demonstrate a troponin of less than 0.05, CK of 49  and an MB of 1.4%.  Hematocrit is 36.  BUN is 17 and creatinine is 1.1   EKG demonstrates a normal sinus rhythm with a right bundle branch block and  a left axis  deviation with borderline LVH and no ST-T wave changes, despite  8/10 chest pain.   Chest x-ray demonstrates no acute cardiopulmonary process.   ASSESSMENT AND PLAN:  [age]-year-old Caucasian woman with symptoms  suggestive of noncardiac/pleuritic chest pain.   1.  Rule out myocardial infarction:  We will check serial cardiac enzymes      and treat with Toradol to control the musculoskeletal/pleuritic chest      pain.  We will start low-dose beta blocker.  2.  Diabetes mellitus:  We will hold the Glucophage and Amaryl, continue      Lantus at this time, pending any potential procedures/stress test.  3.  Psychiatric:  The patient will continue on Prozac.     Reginia Forts, MD   Electronically Signed     ______________________________  Jesse Sans. Wall, M.D.   RA/MEDQ  D:  11/29/2005  T:  11/29/2005  Job:  [id number]

## 2010-10-29 NOTE — Op Note (Signed)
NAME:  Kristen [name], Kristen [name]                         ACCOUNT NO.:  [id number]   MEDICAL RECORD NO.:  [id number]                   PATIENT TYPE:  INP   LOCATION:  3727                                 FACILITY:  [facility]   PHYSICIAN:  Rose Phi. Maple Hudson, M.D.                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   OPERATION PERFORMED:  Excision of squamous carcinoma of the dorsum of the  left hand with primary closure.   SURGEON:  Rose Phi. Maple Hudson, M.D.   ANESTHESIA:  Local.   DESCRIPTION OF PROCEDURE:  The patient was placed on the operating table  with the hand on an arm board.  The lesion was on the dorsum of the left  hand.  An elliptical incision was outlined vertically oriented.  The area  was then thoroughly infiltrated with 1% Xylocaine with Adrenalin.  Incision  was made and it was excised.  There was good hemostasis.  Defect was 4 x 2  cm.  A single layer of interrupted nylon suture was closure.  Dressing  applied.  The patient was then allowed to go home.                                               Rose Phi. Maple Hudson, M.D.    PRY/MEDQ  D:  10/06/2003  T:  10/06/2003  Job:  [id number]

## 2010-10-29 NOTE — H&P (Signed)
NAME:  Kristen [name], Kristen [name] NO.:  [id number]   MEDICAL RECORD NO.:  [id number]          PATIENT TYPE:  INP   LOCATION:  1828                         FACILITY:  [facility]   PHYSICIAN:  Arvilla Meres, M.D. LHCDATE OF BIRTH:  May 21, 1946   DATE OF ADMISSION:  08/17/2004  DATE OF DISCHARGE:                                HISTORY & PHYSICAL   PRIMARY CARDIOLOGIST:  Dr. Daleen Squibb   PRIMARY CARE PHYSICIAN:  Dr. Barton Fanny at [facility] in [place]   CHIEF COMPLAINT:  Chest pain.   HISTORY OF PRESENT ILLNESS:  Onset last evening about 10 p.m.  Patient was  laying in bed.  Had mild headache.  Had sudden onset of pain mid sternal.  She took aspirin without relief, then took one nitroglycerin sublingual.  Eased off in 5-10 minutes.  She rated it a 9 initially.  Decreased to a 6  with nitroglycerin sublingual.  She then fell asleep.  The described the  pain as stinging, sharp pain radiating down her left arm and her fingers  became numb.  When she fell asleep she woke up due to pain again, in a cold  sweat.  She rated the pain around an 8 then.  Took nitroglycerin sublingual  again.  It eased off.  Patient fell back asleep.  She then woke up around  9:00 this morning nauseated.  Chest discomfort returned.  She took  nitroglycerin sublingual and drove to [facility] Emergency Room.  She was  positive for dyspnea, positive for shortness of breath currently rating the  pain around an 8.  She complains of decreased energy and increased fatigue,  dyspnea on exertion over the last two months.   ALLERGIES:  SULFA.   MEDICATIONS:  1.  Prozac 20 q.h.s.  2.  Lipitor 40 daily.  3.  Zantac p.r.n.  4.  Glucophage 1000 mg p.o. b.i.d.   PAST MEDICAL HISTORY:  Last cardiac catheterization was in April of 2005,  similar symptoms.  She had nonobstructive coronary artery disease at that  time with an EF of 60%.  Other past medical history includes diabetes type  2, GERD, hyperlipidemia,  back surgery x2, and bad nerves at times per  patient.   SOCIAL HISTORY:  She lived in [place] alone.  She is disabled.  She has  one adult daughter alive and well with no health problems.  Denies any ETOH,  tobacco use, drugs, or herbal medication use.  She tries to follow a low  sugar diet.  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   FAMILY HISTORY:  Mother deceased at age 47 with heart disease.  Father  deceased age 50 with a massive MI.  No siblings with coronary artery  disease.   REVIEW OF SYSTEMS:  Positive for sweats.  HEENT:  Positive for vertigo.  CARDIOPULMONARY:  Positive for chest pain, shortness of breath, dyspnea on  exertion, palpitations.  GI:  Positive for nausea and GERD symptoms.   PHYSICAL EXAMINATION:  VITAL SIGNS:  Temperature 97.9, pulse 74,  respirations 18, blood pressure 145/86, saturating 99% on 2 L.  GENERAL:  She appears  very comfortable, rating pain 7/10 currently.  HEENT:  Pupils are equal, round, and reactive to light.  NECK:  Supple without lymphadenopathy.  Negative bruit.  Negative JVD.  CARDIOVASCULAR:  Heart rate S1 and S2.  A 2/6 systolic ejection murmur.  Negative rub.  LUNGS:  Clear to auscultation.  ABDOMEN:  Soft, nontender.  Chest wall mildly tender to palpation.  EXTREMITIES:  No edema.   Chest x-ray:  No acute findings.  EKG:  Rate 71, normal sinus rhythm with a  PR 192, QRS 138, QTC 471.  Laboratory work showing white blood cell count 7,  hemoglobin 12, hematocrit 35.8, platelets 251.  Sodium 139, potassium 3.8,  BUN 11, creatinine 0.8.  Point of care enzymes negative x1.  PT 13.6, INR  1.1.  Dr. Nicholes Mango in to see patient, examined.   This is a 65 year old female with diabetes, hyperlipidemia admitted with  some nocturnal chest pain which has waxed and waned despite treatment with  morphine, IV nitroglycerin.  Catheterization in April of 2005 with mild  nonobstructive coronary artery disease.  EKG:  No ST changes, nonacute.   Will plan for cardiac catheterization to redefine coronary anatomy, though I  suspect this is not ACS.  However, nocturnal pain concerning, warrants  further catheterization.  We will admit patient to telemetry.  Continue  heparin, nitroglycerin.  Cycle cardiac enzymes.  Placed on a beta blocker,  aspirin.  Continue Statin.  Also check a D-dimer.      MB/MEDQ  D:  08/17/2004  T:  08/17/2004  Job:  [id number]

## 2010-10-29 NOTE — Discharge Summary (Signed)
[facility]  Patient:    Kristen [name], Kristen [name]                        MRN: [id number] Adm. Date:  [id number] Disc. Date: [id number] Attending:  Rollene Rotunda Dictator:   Lavella Hammock, P.A. CC:         Titus Dubin. Alwyn Ren, M.D. [facility]   Referring Physician Discharge [facility]  DATE OF BIRTH:  November 19, 1945.  PROCEDURE:  Adenosine Cardiolite.  HOSPITAL COURSE:  Mrs. Antonetti is a 65 year old female with no known history of coronary artery disease, who was admitted to the hospital on March 04, 2000 for chest pain.  As part of her evaluation, she had a UA checked and that was significantly positive with leukocytes and nitrites and many bacteria.  She was started on antibiotics for this and evaluated for MI. She ruled out for an MI, so she was scheduled for an adenosine Cardiolite. The adenosine Cardiolite was done on March 06, 2000 and showed no pharmacologic stress-induced ischemia, with an EF of 66%.  She had been febrile upon admission, but this is gradually resolving and her temperature at discharge was within normal limits.  Because of her negative Cardiolite and because her symptoms of infectious were improving, she was considered stable for discharge on March 06, 2000.  LABORATORY AND X-RAY FINDINGS:  Chest x-ray:  Cardiomegaly with no evidence of acute pulmonary disease.  Urine culture:  Greater than 100,000 colonies of E. coli that is positive to fluoroquinolones.  Blood cultures negative x 3 days, will be continued to five days.  Hemoglobin 13.0, hematocrit 38.9, WBC 8.7, platelets 164,000.  Sodium 139, potassium 4.2, chloride 104, CO2 26, BUN 8, creatinine 0.8, glucose 124. Serial CK-MB and troponin Is negative for MI.  DISCHARGE CONDITION:  Improved.  CONSULTANTS:  None.  COMPLICATIONS:  None.  DISCHARGE DIAGNOSES 1. Chest pain, negative myocardial infarction by enzymes and no ischemia by    Cardiolite, followup with primary medical  doctor. 2. Pyelonephritis. 3. Non-insulin-dependent diabetes mellitus. 4. Depression/anxiety.  DISCHARGE INSTRUCTIONS 1. Her activity level is to be as tolerated. 2. She is to stick to a low-fat diabetic diet. 3. She is to follow up with Dr. Titus Dubin. Hopper and call for an appointment. 4. She is to follow up with Dr. Rollene Rotunda as needed.  DISCHARGE MEDICATIONS 1. Ativan 0.5 mg p.r.n. 2. Glucophage 500 mg b.i.d. 3. Prozac 20 mg q.d. 4. Tequin 400 mg q.d. x 10 days. DD:  03/07/00 TD:  03/07/00 Job: [id number]

## 2010-10-29 NOTE — Cardiovascular Report (Signed)
[facility]  Patient:    Kristen [name], Kristen [name]                        MRN: [id number] Proc. Date: 04/18/00 Adm. Date:  [id number] Attending:  Dolores Patty CC:         Titus Dubin. Alwyn Ren, M.D. [facility]  Cardiac Catheterization Laboratory  [facility] Records   Cardiac Catheterization  PROCEDURES PERFORMED:  Left heart catheterization with coronary angiography and left ventriculography.  INDICATIONS:  [name] is a 65 year old woman with risk factors for coronary artery disease.  She has been having recurrent episodes of chest pain prompting now her second hospital admission.  She was referred for cardiac catheterization.  DESCRIPTION OF PROCEDURE:  A 6 French sheath was placed in the right femoral artery.  Standard Judkins 6 French catheters were utilized.  Contrast was Omnipaque.  There were no complications.  RESULTS:  HEMODYNAMICS:  Left ventricular pressure 140/24.  Aortic pressure 140/84. There was no aortic valve gradient.  LEFT VENTRICULOGRAM:  Wall motion is normal.  Ejection fraction calculated at 60%.  There is 2+ mild mitral regurgitation.  CORONARY ARTERIOGRAPHY:  (Right dominant).  Left main:  Left main has a diffuse 30% stenosis in the mid vessel.  Left anterior descending:  The left anterior descending artery has a 30% stenosis in the mid vessel.  The LAD gives rise to a first diagonal and normal sized second diagonal.  Left circumflex:  The left circumflex gives rise to a large branching ramus intermedius, small first and second marginal branches and a normal sized third marginal branch.  There was a 20% stenosis in the distal circumflex.  Right coronary artery:  The right coronary artery gives rise to a normal sized posterior descending artery and a small posterolateral branch.  There is a 30% stenosis in the proximal right coronary artery and a 30% stenosis in the mid vessel.  IMPRESSION: 1. Normal left  ventricular systolic function with mild mitral regurgitation. 2. Mild nonobstructive coronary artery disease. DD:  04/18/00 TD:  04/18/00 Job: [id number]

## 2010-10-29 NOTE — Discharge Summary (Signed)
NAME:  ARAEYA, LAMB NO.:  [id number]   MEDICAL RECORD NO.:  [id number]          PATIENT TYPE:  INP   LOCATION:  3734                         FACILITY:  [facility]   PHYSICIAN:  Dorian Pod, NP    DATE OF BIRTH:  1945-08-03   DATE OF ADMISSION:  08/17/2004  DATE OF DISCHARGE:  08/20/2004                                 DISCHARGE SUMMARY   DISCHARGE DIAGNOSIS:  Chest pain status post cardiac catheterization showing  nonobstructive coronary artery disease, normal left ventricular systolic  function, non-cardiac chest pain, cardiac catheterization performed on August 18, 2004.   PAST MEDICAL HISTORY:  1.  Cardiac catheterization in April 2005 for similar symptoms showing      nonobstructive coronary artery disease at that time with EF 60%.  2.  Type 2 diabetes.  3.  GERD.  4.  Hyperlipidemia.  5.  Back surgery x 2.  6.  Bad nerves per patient.   DISPOSITION:  The patient is discharged home.  She is instructed to continue  her Prozac and Lipitor as previously taken.  She is also instructed to hold  her Glucophage and do not resume until August 20, 2004, in the p.m.  New  medications include aspirin 81 mg daily, Protonix 40 mg daily, Lisinopril  2.5 mg daily.  Activities:  No driving for two days, no lifting over 10  pounds x one week.  She should gently clean the cath site with soap and  water, no tub bathing x 1 week, she is to follow up with [facility]  in two weeks, patient to call for appointment.  She also needs blood work  done at that time.  She will have a BMP drawn secondary to ACE inhibitor  medication being initiated.   HOSPITAL COURSE:  This is a 65 year old Caucasian female admitted for chest  discomfort on the day of admission.  The patient states she took aspirin  without relief, took nitroglycerin and the pain eventually eased off.  It  was decided to admit the patient, cycle cardiac enzymes and plan on  diagnostic cardiac catheterization.   Her cardiac enzymes were negative.  Hemoglobin 13.6, hematocrit 40.7, hemoglobin A1C 8.5.  TSH 0.570.  The  patient went to the cath lab with results as stated above.  She tolerated  cardiac catheterization without complications.  She is being discharged  home.  Instructed to follow up with Dr. Luciana Axe at [facility].  Follow up  at [facility] in two weeks for post cath check and a BMP.      MB/MEDQ  D:  10/08/2004  T:  10/08/2004  Job:  [id number]

## 2010-10-29 NOTE — Discharge Summary (Signed)
NAME:  Kristen [name], SCREWS NO.:  [id number]   MEDICAL RECORD NO.:  [id number]          PATIENT TYPE:  OBV   LOCATION:  2011                         FACILITY:  [facility]   PHYSICIAN:  Jesse Sans. Wall, MD, FACCDATE OF BIRTH:  [birth year]   DATE OF ADMISSION:  06/20/2006  DATE OF DISCHARGE:  06/21/2006                               DISCHARGE SUMMARY   PRIMARY CARDIOLOGIST:  Maisie Fus C. Daleen Squibb, MD, [facility]   PRIMARY CARE PHYSICIAN:  Turkey R. Rankins, M.D.   PRINCIPAL DIAGNOSIS:  Noncardiac chest pain.   SECONDARY DIAGNOSES:  1. Type 2 diabetes.  2. Hyperlipidemia.  3. Depression.  4. Chronic migraines.  5. DJD.  6. Chronic anemia.  7. Kidney stones for lithotripsy in December 2006.  8. Chronic chest discomfort.      a.     Cardiac catheterization in the past has shown nonobstructive       coronary artery disease.  Last catheterization in March 2006       showed a 20% diagonal-2, a 20% RCA with an EF of 65%.      b.     Adenosine Myoview was performed on June 14, 2006 with an       EF of 71%, this study was very mild anterior ischemia, could not       exclude breast attenuation.   PROCEDURES PERFORMED DURING HOSPITALIZATION:  None.   HISTORY OF PRESENT ILLNESS:  This 65 year old Caucasian female with  above-mentioned diagnoses was awakened from her sleep at 3:30 in the  morning with anterior sharp chest pains radiating to the left shoulder  and back.  The patient took sublingual nitroglycerin over 10 minutes  without relief and presented to the emergency room via [facility] nursing home,  who called 9-1-1.  The patient has a history of a recent fall within a  week of admission.  She did not sustain any injuries.   HOSPITAL COURSE:  The patient was seen and evaluated by Dr. Olga Millers and was placed on telemetry and monitored to rule out cardiac  etiology for chest discomfort.  The patient's troponins were found to be  negative x3.  Other labs were within normal limits.   Her TSH was also  found to be at 0.565.  Chest x-ray revealed no acute disease.  The  patient was seen and examined by Dr. Maisie Fus C. Wall on 06/21/2006 and  found to have noncardiac chest pain.  The patient's EKG was also found  to be normal without evidence of ischemia, prior or acute.   LABS ON DISCHARGE:  Hemoglobin 11.2, hematocrit 33.9, white blood cells  8.0, platelets 238.  PT 14.6, INR of 1.1.  PTT 27, D-dimer negative at  0.22.  Sodium 135, potassium 4.3, chloride 102, CO2 24, glucose 132, BUN  16, creatinine 0.83, LFTs were within normal limits.  Hemoglobin A1c was  6.5.  Troponin 0.01, 0.02, and 0.01 respectively.  TSH was 0.525.  EKG  revealed normal sinus rhythm.  There were no arrhythmias.   FOLLOWUP APPOINTMENT AND PLANS:  1. The patient will continue to take medications  as prescribed at      nursing home prior to admission.  2. The patient will followup with Dr. Barbaraann Barthel at [facility].  The      patient will arrange this as an outpatient on her own.   DISCHARGE MEDICATIONS:  1. Aspirin 81 mg a day.  2. Detrol LA daily.  3. Fluoxetine 3 mg daily.  4. Glimepiride 5 mg daily.  5. Lisinopril 10 mg daily.  6. Metformin 1000 mg daily.  7. Bisacodyl 5 mg 2 tablets q.h.s.  8. Vytorin 10/40 q.h.s.  9. Imdur 30 mg daily.  10.Oxycodone APAP 5/500.  11.__________ syrup 1 tsp q.h.s.  12.Pepcid 20 mg b.i.d.  13.Colace 100 mg b.i.d. p.r.n.  14.Mucinex 600 mg 1-2 tablets q.12 h. P.r.n.  15.Alprazolam 0.5 mg q.h.s.  16.Hydrocodone/acetaminophen 5/500 one to two q.4 h. P.r.n.  17.Meclizine 25 mg 1/2 tablet q.8 h. P.r.n.   ALLERGIES:  SULFA.   TIME SPENT WITH PATIENT:  To include physician time--30 minutes.      Bettey Mare. Lyman Bishop, NP      Jesse Sans. Daleen Squibb, MD, [facility]  Electronically Signed    KML/MEDQ  D:  06/21/2006  T:  06/21/2006  Job:  [id number]

## 2010-10-29 NOTE — Discharge Summary (Signed)
NAME:  Kristen [name], Kristen [name] NO.:  [id number]   MEDICAL RECORD NO.:  [id number]           PATIENT TYPE:   LOCATION:                                 FACILITY:   PHYSICIAN:  Eliseo Gum, M.D.   DATE OF BIRTH:  [birth year]   DATE OF ADMISSION:  05/21/2005  DATE OF DISCHARGE:  05/25/2005                                 DISCHARGE SUMMARY   DISCHARGE DIAGNOSES:  1.  Pyelophlebitis.  2.  Type 2 diabetes mellitus with a hemoglobin A1c of 10.6.  3.  Chest pain:  The patient was ruled out for a myocardial infarction with      serial cardiac enzymes and no changes on multiple electrocardiograms.   PAST MEDICAL HISTORY:  1.  History of a right ureteral stone.  The patient scheduled for a      lithotripsy on May 26, 2005.  2.  Atypical chest pain.  The patient had a cardiac catheterization  in      March 2006, which showed non-obstructive disease and normal left      ventricular function with an ejection fraction of 60%.  3.  Gastroesophageal reflux disease.  4.  Hyperlipidemia.  5.  Back surgery x2.  6.  Depression.  7.  BKA  8.  Two disks removed in 1991, and 1993.   DISCHARGE MEDICATIONS:  1.  Lisinopril 10 mg p.o. daily.  2.  Prozac 20 mg p.o. daily.  3.  Vytorin one tablet daily 10/40.  4.  Glucophage 500 mg p.o. b.i.d.  5.  Amaryl 4 mg p.o. daily (new medicine added during this hospital      admission).  6.  Lantus 15 units every night (new medicine added during this hospital      admission).  7.  Imdur 30 mg p.o. b.i.d.  8.  Nitrofurantoin 50 mg p.o. q.i.d. for six more days, for a total of 10      days of antibiotics.  9.  Famotidine 20 mg p.o. daily.  10. Aspirin 81 mg p.o. daily.   FOLLOWUP/CONDITION ON DISCHARGE:  1.  The patient is scheduled for a lithotripsy on May 26, 2005, at      [facility].  The patient is medically stable on the day of      discharge, and has been cleared for surgery.  2.  She has an appointment with Dr. Clydie Braun  L. Richter at [facility] on      June 02, 2005, at 2:15 p.m.  3.  She also has an appointment with Dr. Maisie Fus C. Wall at [facility] for followup on the atypical chest pain on June 03, 2005,      at 11 a.m.   HISTORY OF PRESENT ILLNESS:  Kristen [name] is a 65 year old white female with  a past medical history as indicated above, who came in to the emergency room  because of a sugar of 400.  She measures her sugars twice a day, and they  range between the 150's and 200's.  She also complains of right flank pain  this  past week, and was seen by a urologist four days prior to admission.  She was scheduled for a lithotripsy on Thursday, May 26, 2005, at  [facility].  She says she is having some increased frequency of  urine as well as dysuria.  She was started on some oral antibiotic three  days prior to admission, but this does not seem to be working.   ALLERGIES:  SULFA.   PHYSICAL EXAMINATION:  VITAL SIGNS:  Temperature 99 degrees, blood pressure  121/73, pulse 72, respirations 20.  O2 saturation 96% on room air.  GENERAL:  The patient is in no acute distress.  CARDIOVASCULAR:  A regular rate and rhythm.  No murmurs.  CHEST:  Clear to auscultation bilaterally.  ABDOMEN:  Soft and tender in the right flank, non-distended, with active  bowel sounds.  Right CVA tenderness.  EXTREMITIES:  No clubbing, cyanosis or edema.   LABORATORY DATA:  On admission sodium 140, potassium 4.1, chloride 104,  bicarbonate 31, BUN 11, creatinine 0.9, glucose 243.  Normal liver function  tests except for an albumin of 3.4.  Hemoglobin 12.6, white count 7.7, ANT  43, platelets 254.   HOSPITAL COURSE:  #1 - PYELONEPHRITIS:  Given the history of right ureteral  stone and un-resolving urinary tract infection, the patient was started on  IV ciprofloxacin to cover for pyelonephritis.  Also a urine culture was  ordered, but unfortunately was not done.  The patient had a  dramatic  symptomatic improvement on IV ciprofloxacin.  Also reports were obtained  from Dr. Humberto Leep office and the urine cultures from the office were  sensitive to Nitrofurantoin, and thus the patient was discharged on  Nitrofurantoin, to be taken for seven more days, for a total of 10 days of  antibiotic use.  She was medically ready on the day prior to discharge, and  she is being told that she needs to keep her appointment for the lithotripsy  on Thursday, May 26, 2005.   #2 - DIABETES MELLITUS:  The patient had uncontrolled sugars.  Initially  this was thought to be secondary to an infection.  Her Glucophage was held  during the hospital admission.  She was initially started on Amaryl 4 mg  p.o. daily; however, her sugars continued to stay elevated, and thus she was  started on Lantus.  She is being discharged home on 15 units of Lantus at  bedtime.  She has been instructed on how to use this and was actually  supervised in how to give herself the Lantus shots.  This will be further  monitored as an outpatient.  She also is told to resume her Glucophage.  Note, her hemoglobin A1c drawn was 10.6.   #3 - HYPERLIPIDEMIA:  A fasting lipid panel was obtained during her hospital course and was as  follows:  Serum cholesterol 120, triglycerides 161, HDL 30, LDL 58, thus she  will continue her home dose of medication, and her primary care doctor can  consider starting her on something for her triglycerides.  Hopefully, with  better control of her sugars, the triglycerides will have better control as  well.   #4 - ATYPICAL CHEST PAIN:  The patient did have chest pain during her  hospital admission.  She had three sets of cardiac enzymes which were  negative.  She also had a D-dimer done, which was less than 0.22,  essentially ruling out pulmonary embolus.  She also had electrocardiograms  done, which did not show  any ischemic changes.  She has had a cardiac catheterization in March  2006, which showed non-obstructive disease.  She  was set up to be followed up by Dr. Daleen Squibb at [facility] on June 03, 2005, for the chest pain.   DISCHARGE LABORATORY DATA:  Hemoglobin 12.1, platelets 50, white count 7.  Sodium 142, potassium 3.6, chloride 106, bicarbonate 28, BUN 6, creatinine  0.9, calcium 9.2, glucose 162.      Ellie Lunch, M.D.  Electronically Signed      Eliseo Gum, M.D.  Electronically Signed    BP/MEDQ  D:  05/25/2005  T:  05/25/2005  Job:  [id number]   cc:   Fanny Dance. Rankins, M.D.  Fax: [phone]

## 2010-10-29 NOTE — H&P (Signed)
[facility]  Patient:    Kristen [name], Kristen [name]                        MRN: [id number] Adm. Date:  12/01/00 Attending:  Titus Dubin. Alwyn Ren, M.D. [facility] CC:         [facility]   History and Physical  DATE OF BIRTH:  1946-02-17.  REASON FOR ADMISSION:  Kristen [name] is a 65 year old widow admitted with abdominal pain in the setting of a possible urinary tract infection, diabetes, and NSAID ingestion.  She has been seen twice within the last week at the emergency room at [facility] and has been treated with Macrobid for urinary tract infection.  She now presents with epigastric pain with radiation in the lower abdomen and the right lower quadrant. It is sharp and lasts half an hour or less. It has been intermittent. This has been associated with dizziness and nausea. She denies any melena or rectal bleeding. She has had some substernal chest pain, particularly when supine, but denies any anginal equivalent.  She was seen in the emergency room for dysuria and placed on Macrobid.  She has been ingesting two Ibuprofen every six to 12 hours for joint pain. She also has some numbness of the right knee related to previous surgery.  PAST MEDICAL HISTORY:  Her past medical history includes cholecystectomy, knee surgery bilaterally, back surgery x 2, and one pregnancy and one delivery.  FAMILY HISTORY:  Family history is negative for GI disease.  SOCIAL HISTORY:  She does not drink or smoke. As stated, she is a widow. She has been followed at [facility] recently.  ALLERGIES:  SULFA.  MEDICATIONS: 1. Ranitidine 150 mg twice a day. 2. Cephadyn (BUPAP) as needed. 3. Lorazepam 1 mg as needed. 4. SeraFem 20 mg daily. 5. Metformin 500 mg twice a day.  She did not remember the name of her doctor at [facility].  REVIEW OF SYSTEMS:  Review of systems is as outline above and is otherwise negative except for night sweats which may be hormonal.  PHYSICAL  EXAMINATION:  GENERAL:  She appears deconditioned and uncomfortable.  VITAL SIGNS:  Weight is 220. Temperature 98.5, pulse 56, respiratory rate 20, and blood pressure 100/70.  HEENT:  Arterial ______ is present to a slight degree. The remainder of otolaryngologic exam in unremarkable except for the fact that she is edentulous and does not wear dentures.  NECK:  Thyroid is normal to palpation.  LYMPH NODES:  She has no lymphadenopathy about the head, neck, or axilla.  LUNGS:  Breath sounds are decreased but her chest is clear.  CARDIOVASCULAR:  She has a grade 1 systolic murmur and S4. There is no tachycardia. Abdominal aorta is palpable. Pedal pulses are present. She has no edema.  ABDOMEN:  Bowel sounds are generally decreased significantly. She has diffuse tenderness in all quadrants. No organomegaly or masses.  SKIN:  Warm and dry. She has operative scars over the lumbosacral area, knees, and abdomen.  NEUROLOGIC:  Affect is somewhat flat but there are no localized or neuro/psychiatric deficits.  LABORATORY DATA:  Urinalysis revealed 1+ leukocytes.  She is now admitted with abdominal pain; CBC and differential, amylase and lipase will be collected and she will be placed on Protonix IV.  She as been taking Ibuprofen and this may have induced the gastritis or even ulcer. Stool cards will be monitored. She will be switched to Vioxx or Darvocet when she is able to take  p.o.s safely.  She is a diabetic and is on Metformin. The Metformin will be held and her glucoses monitored with a sliding scale insulin coverage.  Upon her discharge, she will return to [facility] for care. DD:  12/02/00 TD:  12/04/00 Job: [id number]

## 2010-10-29 NOTE — Op Note (Signed)
[facility]  Patient:    Kristen [name], Kristen [name] Visit Number: [id number] MRN: [id number]          Service Type: NES Location: NESC Attending Physician:  Monica Becton Dictated by:   Claudette Laws, M.D. Proc. Date: 12/20/01 Admit Date:  12/20/2001 Discharge Date: 12/20/2001                             Operative Report  PREOPERATIVE DIAGNOSES: 1. Left ureterocele with two distal left ureteral calculi. 2. Recurrent urinary tract infections. 3. Right renal calculi.  POSTOPERATIVE DIAGNOSES: 1. Left ureterocele with two distal left ureteral calculi. 2. Recurrent urinary tract infections. 3. Right renal calculi.  OPERATION PERFORMED:  Cystoscopy and transurethral incision left ureterocele and extraction of two ureteral calculi, insertion of a Foley catheter.  SURGEON:  Claudette Laws, M.D.  DESCRIPTION OF PROCEDURE:  The patient was prepped and draped in the dorsal lithotomy position under general intubated anesthesia. Cystoscopy was performed in the bladder, urine was submitted for culture. The patient was given 400 mg of Cipro preop. Cystoscopy revealed a normal bladder except for a bulging left ureterocele, a somewhat patulous right ureterocele and no obvious tumors or calculi.  I then switched to the Olympus resectoscope sheath and using the [facility], a transverse incision was made at the lower end of the ureterocele. Eventually we were able to incise into it and extract the two stones which we removed and will send for stone analysis. At the end, the ureterocele appeared to be a flap-like result. There was a few bleeders which we electrocoagulated. I put in an 42 French 5 cc Foley catheter and the patient was taken back to the recovery room in satisfactory condition. Dictated by:   Claudette Laws, M.D. Attending Physician:  Monica Becton DD:  12/20/01 TD:  12/23/01 Job: [id number]

## 2010-10-29 NOTE — H&P (Signed)
[facility]  Patient:    Kristen [name], Kristen [name]                        MRN: [id number] Adm. Date:  [id number] Attending:  Dolores Patty CC:         [facility], [address] [place], [place]   History and Physical  HISTORY OF PRESENT ILLNESS:  The patient is a 65 year old white female diabetic who presents with chest pain.  She has had chest pain almost constantly since noon, which was described as substernal with radiation to the jaw.  It was nonexertional but relieved by nitroglycerin by the first pill. It recurred with only partial response with the second nitroglycerin.  Here in the emergency room, she has had some partial response to nitroglycerin as well as morphine.  Each time she is reevaluated, her pain has been at least a 7 on a scale of 10.  She has also had shortness of breath associated with this.  She underwent a stress Cardiolite, March 06, 2000, which was negative for ischemia.  PAST MEDICAL HISTORY:  Her past medical history includes cholecystectomy, back surgery x 2, C-section and total knee replacement bilaterally; she has also had carpal tunnel surgery.  She is a gravida 1, para 1.  MEDICATIONS 1. Glucophage 500 mg twice a day. 2. Prozac 20 mg daily. 3. Prempro daily. 4. Coated aspirin.  ALLERGIES:  She is allergic to SULFA.  FAMILY HISTORY:  Positive for coronary artery disease and diabetes in both mother and father.  REVIEW OF SYSTEMS:  Review of systems includes some frontal headache today, for which she takes Tylenol.  She has had some epigastric pain but denies melena or rectal bleeding.  PHYSICAL EXAMINATION  GENERAL:  At this time, she appears to be in no acute distress; her affect is flat and somewhat depressed.  She is deconditioned with weight excess.  VITAL SIGNS:  Pulse is 68 and regular, blood pressure 118/48, O2 saturations were 98% on O2.  HEENT:  She has arteriolar narrowing.  She is completely  edentulous.  The tympanic membranes are somewhat scarred.  NECK:  She has no carotid bruits noted.  CARDIAC:  An S4 is present.  CHEST:  Clear to auscultation.  EXTREMITIES:  Homans sign is negative.  Pedal pulses are intact and there is no edema.  BREASTS:  Initially deferred.  PELVIC:  Initially deferred.  RECTAL:  Initially deferred.  NEUROLOGIC:  There are no localizing neurologic signs but exam is negative because of her acute symptomatology.  LABORATORY FINDINGS:  EKG reveals nonspecific T wave changes.  Cardiac enzymes are negative.  ASSESSMENT AND PLAN:  She will be admitted to telemetry.  Cardiology will be reconsulted; consideration should be given to catheterization to resolve this issue once and for all.  It is possible that the Cardiolite study is negative because of homogenous diffuse total coronary artery disease.  The Glucophage will be held.  Upon discharge, she will return to the care of [facility], where she has been seen for the last one and a half months.  She previously has been a patient of Dr. Titus Dubin. Alwyn Ren but she had not seen him since March of 2000, instead going to [place] until she was referred to [facility], where she believes she has seen a ______ , ? Jeanie.  ADDENDUM:  She does not drink or smoke.  She is widowed.  She is now not engaged in any exercise program. DD:  04/17/00  TD:  04/18/00 Job: [id number]

## 2010-10-29 NOTE — H&P (Signed)
NAME:  Kristen [name], Kristen [name] NO.:  [id number]   MEDICAL RECORD NO.:  [id number]          PATIENT TYPE:  INP   LOCATION:  1831                         FACILITY:  [facility]   PHYSICIAN:  Madolyn Frieze. Jens Som, MD, FACCDATE OF BIRTH:  03-30-1946   DATE OF ADMISSION:  06/20/2006  DATE OF DISCHARGE:                              HISTORY & PHYSICAL   SUMMARY OF HISTORY:  Kristen [name] is a 65 year old white female who was  awakened from sleep at 3:30 this morning with anterior sharp chest pains  radiating to her left shoulder and her back.  She took two sublingual  nitroglycerin over 10 minutes without relief.  She denied any nausea,  vomiting, diaphoresis or shortness of breath.  Her nursing home called 9-  1-1.  She states she received additional sublingual nitroglycerin during  transport, again without relief of her discomfort.  EMS report is not  available.  Her discomfort is similar to prior occurrences; however, she  states that today it is sharper than usual.  She gives it an 8 on a  scale of 0/10.  She thinks her last episode was this past Sunday that  lasted less than 5 minutes.  She is unclear of frequency.  The last time  her discomfort was a zero was before bed.  She states it is worse when  she is upset.  She states the last fall she had was last week in the  bathroom.  She has not had any injuries.  She attributes her falls to  her leg weakness and they just give out.   PAST MEDICAL HISTORY:   ALLERGIES:  SULFA.   MEDICATIONS PRIOR TO ADMISSION:  (Based on nursing home report):  1. Aspirin 81 daily.  2. Detrol LA unknown dosage daily.  3. Fluoxetine 40 mg daily.  4. Glimepiride 4 mg daily.  5. Lisinopril 10 mg daily.  6. Metformin 1000 mg daily.  7. Bisacodyl 5 mg 2 tablets q.h.s.  8. Vytorin 10/40 q.h.s.  9. Imdur 30 daily.  10.Oxycodone/APAP 5/500 mg q.h.s.  11.Cheratussin syrup 1 teaspoon q.h.s.  12.Pepcid 20 mg b.i.d.  13.Colace 100 mg b.i.d. p.r.n.  14.Mucinex 600 mg 1-2 tablets q.12h. p.r.n.  15.Alprazolam 0.5 mg q.h.s.  16.Hydro/acetaminophen 5/500, 1-2 tabs q.4-6h. p.r.n.  17.Meclizine 25 mg half tablet q.8h. p.r.n.   MEDICAL HISTORY:  1. Type 2 diabetes.  2. GERD.  3. Hyperlipidemia.  4. Depression.  5. Chronic migraines.  6. DJD.  7. Chronic anemia.  8. Kidney stones for lithotripsy in December of 2006.  9. She has chronic chest discomfort.  Catheterizations in the past      have shown nonobstructive coronary artery disease.  Last cath in      March of 2006 showed a 20% diagonal two, a 20% RCA with an EF of      65 %.  She was recently seen in the office by Dr. Daleen Squibb for same      discomfort described above.  An adenosine Myoview was performed on      June 14, 2006 and showed an EF of 71%.  The  study with very mild      anterior ischemia; could not exclude breast attenuation.  Dr. Daleen Squibb      reviewed this and felt that this is a very low risk study.   SURGICAL HISTORY:  1. Back surgeries x2 in 1991 and 1993.  2. Bilateral knee replacements.  3. Right carpal tunnel.   SOCIAL HISTORY:  She resides in the [facility].  She  does live with a roommate.  She is on disability secondary to multiple  falls.  She is widowed.  One daughter, no grandchildren.  She has never  smoked, used alcohol, drugs or herbal medication.  She maintains an 1800  ADA diet.  She walks daily, but she is not clear as to how much exercise  she actually receives.   FAMILY HISTORY:  Mother died at the age of 81, unknown cause.  Father at  age 63 with myocardial infarction.  One sister and one brother are  living; unknown health status.  Three brothers are deceased - two with  lung cancer, one with a brain tumor.   REVIEW OF SYSTEMS:  In addition to the above is notable for chronic  migraines, reading glasses, edentulous, chronic dyspnea on exertion  which has not changed, stress incontinence, postmenopausal, generalized  weakness  and fatigue, depression, back arthralgias, GERD which is  chronic in nature and has not recently changed.   PHYSICAL EXAMINATION:  GENERAL:  Well-nourished, well-developed, obese  white female in no apparent distress.  She is accompanied by her  daughter.  VITAL SIGNS:  Temperature is 98.2, blood pressure 121/84, pulse 91,  respirations 20, 95% sats on 2 liters.  HEENT:  Unremarkable except for edentulous.  NECK:  Supple without thyromegaly, adenopathy, JVD or carotid bruits.  CHEST:  Symmetrical excursion.  LUNGS:  Clear to auscultation without rales, rhonchi or wheezing.  HEART:  PMI is not displaced.  Regular rate and rhythm.  I do not  appreciate any murmurs, rubs, clicks or gallops.  All pulses are  symmetrical and intact.  SKIN:  Integument intact.  ABDOMEN:  Obese.  Bowel sounds present without organomegaly, masses or  tenderness.  EXTREMITIES:  Negative cyanosis, clubbing or edema.  MUSCULOSKELETAL:  Grossly unremarkable.  However, with chest wall  palpation and upper extremity range of motion, this exacerbates her  discomfort.   CHEST X-RAY:  Cardiomegaly, no active disease.   ELECTROCARDIOGRAM:  Normal sinus rhythm, left axis deviation, right  bundle branch block, first-degree AV similar to June 2007 EKG.   LABORATORY DATA:  Hemoglobin and hematocrit are 11.2 and 33.9, normal  indices, platelets 238, WBCs 8.0.  Sodium 135, potassium 43, BUN 16,  creatinine 0.8, glucose 132.  Normal LFTs.  Point care markers have been  negative x2.  D-dimer is negative.   IMPRESSION:  Prolonged atypical chest discomfort, probable  musculoskeletal discomfort based on exam.  EKG and point of care markers  were unremarkable.  She has recently had a low risk adenosine Myoview  performed June 14, 2006 with an EF of 71%.  History per past medical  history.   DISPOSITION:  Dr. Jens Som reviewed the patient's history, spoke with and examined and agrees with the above.  We will admit her  for  observation to rule out myocardial infarction.  If she rules in for  myocardial infarction.  We will arrange a cardiac catheterization.  If  negative, anticipate discharge home with outpatient follow-up with her  medical care physician for reoccurring musculoskeletal discomfort.  Would also suggest her primary care physician evaluate her anemia  further.      Joellyn Rued, PA-C      Madolyn Frieze Jens Som, MD, [facility]  Electronically Signed    EW/MEDQ  D:  06/20/2006  T:  06/20/2006  Job:  [id number]   cc:   Fanny Dance. Rankins, M.D.  Thomas C. Wall, MD, [facility]

## 2010-11-29 ENCOUNTER — Emergency Department (HOSPITAL_COMMUNITY): Payer: Medicare Other

## 2010-11-29 ENCOUNTER — Emergency Department (HOSPITAL_COMMUNITY)
Admission: EM | Admit: 2010-11-29 | Discharge: 2010-11-29 | Disposition: A | Discharge status: Home / Self Care | Payer: Medicare Other | Attending: Emergency Medicine | Admitting: Emergency Medicine

## 2010-11-29 DIAGNOSIS — S0003XA Contusion of scalp, initial encounter: Secondary | ICD-10-CM | POA: Insufficient documentation

## 2010-11-29 DIAGNOSIS — W108XXA Fall (on) (from) other stairs and steps, initial encounter: Secondary | ICD-10-CM | POA: Insufficient documentation

## 2010-11-29 DIAGNOSIS — Z7982 Long term (current) use of aspirin: Secondary | ICD-10-CM | POA: Insufficient documentation

## 2010-11-29 DIAGNOSIS — Z794 Long term (current) use of insulin: Secondary | ICD-10-CM | POA: Insufficient documentation

## 2010-11-29 DIAGNOSIS — M545 Low back pain, unspecified: Secondary | ICD-10-CM | POA: Insufficient documentation

## 2010-11-29 DIAGNOSIS — R040 Epistaxis: Secondary | ICD-10-CM | POA: Insufficient documentation

## 2010-11-29 DIAGNOSIS — IMO0002 Reserved for concepts with insufficient information to code with codable children: Secondary | ICD-10-CM | POA: Insufficient documentation

## 2010-11-29 DIAGNOSIS — R109 Unspecified abdominal pain: Secondary | ICD-10-CM | POA: Insufficient documentation

## 2010-11-29 DIAGNOSIS — R079 Chest pain, unspecified: Secondary | ICD-10-CM | POA: Insufficient documentation

## 2010-11-29 DIAGNOSIS — R51 Headache: Secondary | ICD-10-CM | POA: Insufficient documentation

## 2010-11-29 DIAGNOSIS — M25559 Pain in unspecified hip: Secondary | ICD-10-CM | POA: Insufficient documentation

## 2011-01-13 ENCOUNTER — Encounter: Payer: Self-pay | Admitting: Family Medicine

## 2011-03-08 LAB — DIFFERENTIAL
Lymphocytes Relative: 27
Lymphs Abs: 2.4
Monocytes Absolute: 0.8
Monocytes Relative: 9
Neutro Abs: 5.7

## 2011-03-08 LAB — POCT I-STAT, CHEM 8
HCT: 38
Hemoglobin: 12.9
Potassium: 5.4 — ABNORMAL HIGH
Sodium: 139
TCO2: 21

## 2011-03-08 LAB — POCT CARDIAC MARKERS
CKMB, poc: 1.5
Myoglobin, poc: 77.3
Troponin i, poc: <0.05

## 2011-03-08 LAB — CBC
Hemoglobin: 12.1
MCHC: 33.3
RBC: 4.18
WBC: 9.1

## 2011-03-09 LAB — BASIC METABOLIC PANEL
BUN: 11
BUN: 22
BUN: 22
BUN: 7
BUN: 9
CO2: 25
CO2: 26
CO2: 28
CO2: 29
Chloride: 103
Chloride: 103
Chloride: 105
Chloride: 106
Chloride: 107
Creatinine, Ser: 0.81
Creatinine, Ser: 0.93
Creatinine, Ser: 1.45 — ABNORMAL HIGH
GFR calc Af Amer: >60
Glucose, Bld: 150 — ABNORMAL HIGH
Glucose, Bld: 227 — ABNORMAL HIGH
Glucose, Bld: 65 — ABNORMAL LOW
Potassium: 3.9
Potassium: 4.9
Potassium: 6.5

## 2011-03-09 LAB — URINALYSIS, ROUTINE W REFLEX MICROSCOPIC
Bilirubin Urine: NEGATIVE
Hgb urine dipstick: NEGATIVE
Ketones, ur: NEGATIVE
Nitrite: NEGATIVE
Protein, ur: NEGATIVE
Specific Gravity, Urine: 1.016
Urobilinogen, UA: 0.2

## 2011-03-09 LAB — URINE MICROSCOPIC-ADD ON

## 2011-03-09 LAB — CBC
HCT: 24 — ABNORMAL LOW
HCT: 24.8 — ABNORMAL LOW
HCT: 25.7 — ABNORMAL LOW
Hemoglobin: 11.6 — ABNORMAL LOW
Hemoglobin: 8.4 — ABNORMAL LOW
MCHC: 32.2
MCHC: 32.8
MCHC: 33.1
MCHC: 33.8
MCHC: 33.9
MCV: 84.4
MCV: 84.8
MCV: 85.8
MCV: 86
MCV: 86.7
MCV: 87.9
Platelets: 144 — ABNORMAL LOW
Platelets: 157
Platelets: 159
Platelets: 166
RBC: 2.38 — ABNORMAL LOW
RBC: 2.92 — ABNORMAL LOW
RBC: 4.06
RDW: 15.3
RDW: 15.5
RDW: 15.9 — ABNORMAL HIGH
WBC: 6.7

## 2011-03-09 LAB — POCT I-STAT EG7
Acid-base deficit: 2
Bicarbonate: 23
Calcium, Ion: 1.25
HCT: 26 — ABNORMAL LOW
Hemoglobin: 8.8 — ABNORMAL LOW
Operator id: 247171
Sodium: 137
pH, Ven: 7.395 — ABNORMAL HIGH
pO2, Ven: 61 — ABNORMAL HIGH

## 2011-03-09 LAB — COMPREHENSIVE METABOLIC PANEL
ALT: 31
AST: 16
AST: 26
Albumin: 2.1 — ABNORMAL LOW
Alkaline Phosphatase: 56
CO2: 26
Chloride: 107
Creatinine, Ser: 0.81
GFR calc Af Amer: >60
GFR calc Af Amer: >60
Glucose, Bld: 204 — ABNORMAL HIGH
Potassium: 3.6
Potassium: 4.9
Sodium: 137
Total Bilirubin: 0.9
Total Protein: 5.2 — ABNORMAL LOW
Total Protein: 7.1

## 2011-03-09 LAB — CROSSMATCH: Antibody Screen: NEGATIVE

## 2011-03-09 LAB — PREPARE RBC (CROSSMATCH)

## 2011-03-09 LAB — URINE CULTURE: Special Requests: NEGATIVE

## 2011-03-09 LAB — DIFFERENTIAL
Basophils Relative: 0
Eosinophils Absolute: 0.1
Eosinophils Relative: 2
Lymphs Abs: 2
Monocytes Relative: 8
Neutrophils Relative %: 58

## 2011-03-09 LAB — HEMOGLOBIN A1C: Hgb A1c MFr Bld: 6.8 — ABNORMAL HIGH

## 2011-03-09 LAB — PROTIME-INR: INR: 1.1

## 2011-03-09 LAB — HEMOGLOBIN AND HEMATOCRIT, BLOOD
Hemoglobin: 8.4 — ABNORMAL LOW
Hemoglobin: 9.8 — ABNORMAL LOW

## 2011-03-25 LAB — DIFFERENTIAL
Basophils Absolute: 0.1
Eosinophils Relative: 2
Lymphocytes Relative: 31
Lymphs Abs: 2.3
Monocytes Relative: 10
Neutro Abs: 4.1
Smear Review: ADEQUATE

## 2011-03-25 LAB — CBC
HCT: 33.1 — ABNORMAL LOW
MCHC: 33.4
MCV: 86.3
Platelets: 269
RDW: 16.1 — ABNORMAL HIGH
WBC: 7.5

## 2011-03-25 LAB — URINE MICROSCOPIC-ADD ON

## 2011-03-25 LAB — COMPREHENSIVE METABOLIC PANEL
AST: 53 — ABNORMAL HIGH
Albumin: 3.4 — ABNORMAL LOW
BUN: 32 — ABNORMAL HIGH
Calcium: 8.6
Creatinine, Ser: 1.6 — ABNORMAL HIGH
GFR calc Af Amer: 40 — ABNORMAL LOW
Total Protein: 6.3

## 2011-03-25 LAB — URINALYSIS, ROUTINE W REFLEX MICROSCOPIC
Bilirubin Urine: NEGATIVE
Ketones, ur: NEGATIVE
Nitrite: NEGATIVE
Specific Gravity, Urine: 1.009
Urobilinogen, UA: 0.2
pH: 5.5

## 2011-03-25 LAB — LIPASE, BLOOD: Lipase: 21

## 2011-03-30 LAB — CBC
HCT: 31.5 — ABNORMAL LOW
HCT: 34.3 — ABNORMAL LOW
HCT: 37.4
Hemoglobin: 10.3 — ABNORMAL LOW
Hemoglobin: 11.3 — ABNORMAL LOW
Hemoglobin: 12.3
MCHC: 33
Platelets: 268
RBC: 3.71 — ABNORMAL LOW
RBC: 4.03
RDW: 17.2 — ABNORMAL HIGH
RDW: 17.4 — ABNORMAL HIGH
WBC: 6.1
WBC: 6.4

## 2011-03-30 LAB — BASIC METABOLIC PANEL
Calcium: 8.3 — ABNORMAL LOW
GFR calc Af Amer: >60
GFR calc Af Amer: >60
GFR calc non Af Amer: 54 — ABNORMAL LOW
GFR calc non Af Amer: 56 — ABNORMAL LOW
Glucose, Bld: 128 — ABNORMAL HIGH
Potassium: 4.1
Potassium: 4.2
Sodium: 139
Sodium: 140

## 2011-03-30 LAB — DIFFERENTIAL
Basophils Absolute: 0
Basophils Relative: 0
Eosinophils Relative: 1
Monocytes Absolute: 0.7

## 2011-03-30 LAB — I-STAT 8, (EC8 V) (CONVERTED LAB)
Acid-Base Excess: 1
TCO2: 28
pCO2, Ven: 46.3
pH, Ven: 7.367 — ABNORMAL HIGH

## 2011-03-30 LAB — TROPONIN I: Troponin I: 0.02

## 2011-03-30 LAB — CK TOTAL AND CKMB (NOT AT ARMC)
CK, MB: 1.8
Relative Index: INVALID

## 2011-03-30 LAB — CARDIAC PANEL(CRET KIN+CKTOT+MB+TROPI)
Relative Index: INVALID
Total CK: 45
Total CK: 56

## 2011-03-30 LAB — PROTIME-INR
INR: 1
Prothrombin Time: 13.8

## 2011-03-30 LAB — APTT: aPTT: 34

## 2011-03-30 LAB — POCT CARDIAC MARKERS: Operator id: 284251

## 2011-03-30 LAB — HEMOGLOBIN A1C: Hgb A1c MFr Bld: 7.1 — ABNORMAL HIGH

## 2011-07-27 DIAGNOSIS — I259 Chronic ischemic heart disease, unspecified: Secondary | ICD-10-CM | POA: Diagnosis not present

## 2011-07-27 DIAGNOSIS — E119 Type 2 diabetes mellitus without complications: Secondary | ICD-10-CM | POA: Diagnosis not present

## 2011-07-27 DIAGNOSIS — E559 Vitamin D deficiency, unspecified: Secondary | ICD-10-CM | POA: Diagnosis not present

## 2011-07-27 DIAGNOSIS — E785 Hyperlipidemia, unspecified: Secondary | ICD-10-CM | POA: Diagnosis not present

## 2011-07-27 DIAGNOSIS — I10 Essential (primary) hypertension: Secondary | ICD-10-CM | POA: Diagnosis not present

## 2012-08-24 ENCOUNTER — Encounter: Payer: Self-pay | Admitting: Family Medicine

## 2012-08-24 DIAGNOSIS — E559 Vitamin D deficiency, unspecified: Secondary | ICD-10-CM

## 2012-08-30 ENCOUNTER — Telehealth: Payer: Self-pay | Admitting: Family Medicine

## 2012-08-30 NOTE — Telephone Encounter (Signed)
Patient reports bleeding from her left nipple since Sunday.  Steady enough that she is wearing a band aid on breast.  Says breast is sore but not terribly.  Noted that she has routing OV scheduled for 09/07/12. I told her we should be see her sooner to eval this problem.  She says she has to schedule all appts thru her daughter.  Told her to call her daughter and tell her what her problem is and to have her daughter call us to see if we can get her in here sooner for evaluation of breast bleeding. Last mammogram 08/2010.  Pt states has not had one since.

## 2012-09-07 ENCOUNTER — Ambulatory Visit: Payer: Self-pay | Admitting: Physician Assistant

## 2012-09-07 ENCOUNTER — Ambulatory Visit (INDEPENDENT_AMBULATORY_CARE_PROVIDER_SITE_OTHER): Payer: Medicare Other | Admitting: Physician Assistant

## 2012-09-07 ENCOUNTER — Encounter: Payer: Self-pay | Admitting: Physician Assistant

## 2012-09-07 VITALS — BP 104/70 | HR 72 | Temp 98.3°F | Resp 18 | Ht 63.0 in | Wt 203.0 lb

## 2012-09-07 DIAGNOSIS — E785 Hyperlipidemia, unspecified: Secondary | ICD-10-CM | POA: Diagnosis not present

## 2012-09-07 DIAGNOSIS — I10 Essential (primary) hypertension: Secondary | ICD-10-CM | POA: Diagnosis not present

## 2012-09-07 DIAGNOSIS — N6459 Other signs and symptoms in breast: Secondary | ICD-10-CM

## 2012-09-07 DIAGNOSIS — K219 Gastro-esophageal reflux disease without esophagitis: Secondary | ICD-10-CM

## 2012-09-07 DIAGNOSIS — E669 Obesity, unspecified: Secondary | ICD-10-CM

## 2012-09-07 DIAGNOSIS — G4700 Insomnia, unspecified: Secondary | ICD-10-CM

## 2012-09-07 DIAGNOSIS — E119 Type 2 diabetes mellitus without complications: Secondary | ICD-10-CM | POA: Diagnosis not present

## 2012-09-07 DIAGNOSIS — N3281 Overactive bladder: Secondary | ICD-10-CM

## 2012-09-07 DIAGNOSIS — F4323 Adjustment disorder with mixed anxiety and depressed mood: Secondary | ICD-10-CM

## 2012-09-07 DIAGNOSIS — N318 Other neuromuscular dysfunction of bladder: Secondary | ICD-10-CM

## 2012-09-07 DIAGNOSIS — N6452 Nipple discharge: Secondary | ICD-10-CM

## 2012-09-07 MED ORDER — OMEPRAZOLE 20 MG PO CPDR: 20.0000 mg | DELAYED_RELEASE_CAPSULE | Freq: Two times a day (BID) | ORAL | Status: DC

## 2012-09-07 MED ORDER — EZETIMIBE 10 MG PO TABS
10.0000 mg | ORAL_TABLET | Freq: Every day | ORAL | Status: DC
Start: 2012-09-07 — End: 2012-12-10

## 2012-09-07 MED ORDER — METFORMIN HCL ER (MOD) 1000 MG PO TB24: 1000.0000 mg | ORAL_TABLET | Freq: Two times a day (BID) | ORAL | Status: DC

## 2012-09-07 MED ORDER — ATORVASTATIN CALCIUM 40 MG PO TABS: 40.0000 mg | ORAL_TABLET | Freq: Every day | ORAL | Status: DC

## 2012-09-07 MED ORDER — OXYBUTYNIN CHLORIDE 5 MG PO TABS: 5.0000 mg | ORAL_TABLET | Freq: Two times a day (BID) | ORAL | Status: DC

## 2012-09-07 MED ORDER — FLUOXETINE HCL 40 MG PO CAPS: 40.0000 mg | ORAL_CAPSULE | Freq: Every day | ORAL | Status: DC

## 2012-09-07 MED ORDER — INSULIN GLARGINE 100 UNIT/ML ~~LOC~~ SOLN: 30.0000 [IU] | Freq: Every day | SUBCUTANEOUS | Status: DC

## 2012-09-07 MED ORDER — LISINOPRIL 10 MG PO TABS: 10.0000 mg | ORAL_TABLET | Freq: Every day | ORAL | Status: DC

## 2012-09-07 MED ORDER — ZOLPIDEM TARTRATE 10 MG PO TABS: 10.0000 mg | ORAL_TABLET | Freq: Every day | ORAL | Status: DC

## 2012-09-07 NOTE — Progress Notes (Signed)
Patient ID: Kristen [name] MRN: [id number], DOB: 05/22/1946, 67 y.o. Date of Encounter: @DATE @  Chief Complaint:  Chief Complaint  Patient presents with  . Medication Refill  . bleeding from left nipple    HPI: 67 y.o. year old female  presents with:  1-Noticed drop of blood coming from left nipple about two weeks ago. Had no transportation to get here until now. (Here with daughter but she has to work a lot). Has been keeping band aid on it. Just gets specks of blood. Has had no purulent drainage. No milk from nipple. Has felt no mass.  2- DM- on Metformin and Lantus-using 30 units QHS.   3- HLD- Some of her meds have run out. DM meds have been refilled since cannot go without those.   4-HTN- Some of her meds have run out.   Currently out of most of her meds. LOV here 2/13. Was unable to come in secondary to transportation issues. Her daughter is her source of transportation and she has to work.   Past Medical History  Diagnosis Date  . Diabetes mellitus   . Heart disease   . Hyperlipidemia   . Allergy   . Anxiety   . Depression   . Arthritis   . Acute angina   . Hypertension   . Hemorrhoids   . CAD (coronary artery disease)   . Obesity   . Overactive bladder   . Vitamin D deficiency   . GERD (gastroesophageal reflux disease)      Home Meds: Current Outpatient Prescriptions on File Prior to Visit  Medication Sig Dispense Refill  . aspirin 81 MG tablet Take 81 mg by mouth daily.        Tery Sanfilippo Calcium (STOOL SOFTENER PO) Take 1 capsule by mouth 2 (two) times daily.        . [DISCONTINUED] oxybutynin (DITROPAN) 5 MG tablet Take 5 mg by mouth 2 (two) times daily.        . Cholecalciferol (VITAMIN D) 2000 UNITS tablet Take 2,000 Units by mouth daily.         No current facility-administered medications on file prior to visit.    Allergies:  Allergies  Allergen Reactions  . Sulfonamide Derivatives Hives    History   Social History  . Marital Status:  Widowed    Spouse Name: N/A    Number of Children: N/A  . Years of Education: N/A   Occupational History  . Not on file.   Social History Main Topics  . Smoking status: Never Smoker   . Smokeless tobacco: Never Used  . Alcohol Use: No  . Drug Use: No  . Sexually Active: Not on file   Other Topics Concern  . Not on file   Social History Narrative  . No narrative on file    Family History  Problem Relation Age of Onset  . Cancer Mother     mouth cancer  . Lung cancer Brother   . Lung cancer Brother   . Lung cancer Brother      Review of Systems: Constitutional: negative for chills, fever, night sweats, weight changes, or fatigue  HEENT: negative for vision changes, hearing loss, congestion, rhinorrhea, ST, epistaxis, or sinus pressure Cardiovascular: negative for chest pain or palpitations. No new/increased shortness of breath or dyspnea on exertion Respiratory: negative for hemoptysis, wheezing, shortness of breath, or cough Abdominal: negative for abdominal pain, nausea, vomiting, diarrhea, or constipation Dermatological: negative for rash or concerning skin lesions  Neurologic: negative for headache, dizziness, or syncope All other systems reviewed and are otherwise negative with the exception to those above and in the HPI.   Physical Exam: Blood pressure 104/70, pulse 72, temperature 98.3 F (36.8 C), temperature source Oral, resp. rate 18, height 5\' 3"  (1.6 m), weight 203 lb (92.08 kg)., Body mass index is 35.97 kg/(m^2). General: Well developed, well nourished, in no acute distress. Neck: Supple. No thyromegaly. Full ROM. No lymphadenopathy. Lungs: Clear bilaterally to auscultation without wheezes, rales, or rhonchi. Breathing is unlabored. Heart: RRR with S1 S2. No murmurs, rubs, or gallops. Abdomen: Soft, non-tender, non-distended with normoactive bowel sounds. No hepatomegaly. No rebound/guarding. No obvious abdominal masses. Musculoskeletal:  Strength and tone  normal for age. Extremities/Skin: Warm and dry. No clubbing or cyanosis. No edema. No rashes or suspicious lesions. Neuro: Alert and oriented X 3. Moves all extremities spontaneously. Gait is normal. CNII-XII grossly in tact. Psych:  Responds to questions appropriately with a normal affect.  Breasts: Left nipple with several tiny 1-2 mm dots of black color secondary to dried blood/scab. Even with palpation, no active blood or drainage from site.  Both breasts and axilla/neck palpated-no masses with palpation. Also, palpation of nipple area -no mass in this area.      ASSESSMENT AND PLAN:  67 y.o. year old female with  1. DIABETES MELLITUS, TYPE II  - Hemoglobin A1c - Microalbumin, urine - metFORMIN (GLUMETZA) 1000 MG (MOD) 24 hr tablet; Take 1 tablet (1,000 mg total) by mouth 2 (two) times daily.  Dispense: 30 tablet; Refill: 5 - insulin glargine (LANTUS SOLOSTAR) 100 UNIT/ML injection; Inject 0.3 mLs (30 Units total) into the skin at bedtime.  Dispense: 10 mL; Refill: 12  2. GERD  - omeprazole (PRILOSEC) 20 MG capsule; Take 1 capsule (20 mg total) by mouth 2 (two) times daily.  Dispense: 30 capsule; Refill: 5  3. HYPERLIPIDEMIA  - COMPLETE METABOLIC PANEL WITH GFR - atorvastatin (LIPITOR) 40 MG tablet; Take 1 tablet (40 mg total) by mouth daily.  Dispense: 30 tablet; Refill: 5 - ezetimibe (ZETIA) 10 MG tablet; Take 1 tablet (10 mg total) by mouth daily.  Dispense: 30 tablet; Refill: 5 Pt not fasting now so will skip FLP. Also, last FLP was stable, no med changes were made at that time.  4. HYPERTENSION, UNSPECIFIED - COMPLETE METABOLIC PANEL WITH GFR - lisinopril (PRINIVIL,ZESTRIL) 10 MG tablet; Take 1 tablet (10 mg total) by mouth daily.  Dispense: 30 tablet; Refill: 5  5. OBESITY  6. Nipple discharge, bloody She is overdue for mammogram. She is to schedule mammogram ASAP. Daughter prefers that they schedule themselves since she will know her work schedule, rather than me  scheduling referral. Last mammogram 09/01/10. They are both aware of need to f/u with mammogram to help exclude malignancy.  7. Adjustment disorder with mixed anxiety and depressed mood - FLUoxetine (PROZAC) 40 MG capsule; Take 1 capsule (40 mg total) by mouth daily.  Dispense: 30 capsule; Refill: 5  8. Overactive bladder - oxybutynin (DITROPAN) 5 MG tablet; Take 1 tablet (5 mg total) by mouth 2 (two) times daily.  Dispense: 60 tablet; Refill: 5  9. Insomnia - zolpidem (AMBIEN) 10 MG tablet; Take 1 tablet (10 mg total) by mouth at bedtime.  Dispense: 30 tablet; Refill: 2  Discussed that she needs ROV Q 3 months but if this is not feasible, at least needs OV and Fasting Labs Q 6 months. Pt and daughter both voice understanding and agree.  Signed,  [address] [place], [place], [facility] 09/07/2012 4:54 PM

## 2012-09-08 LAB — COMPLETE METABOLIC PANEL WITH GFR
ALT: 26 U/L (ref 0–35)
Albumin: 3.9 g/dL (ref 3.5–5.2)
CO2: 24 mEq/L (ref 19–32)
GFR, Est African American: 67 mL/min
GFR, Est Non African American: 58 mL/min — ABNORMAL LOW
Glucose, Bld: 107 mg/dL — ABNORMAL HIGH (ref 70–99)
Potassium: 5.2 mEq/L (ref 3.5–5.3)
Sodium: 144 mEq/L (ref 135–145)
Total Bilirubin: 0.4 mg/dL (ref 0.3–1.2)
Total Protein: 6.7 g/dL (ref 6.0–8.3)

## 2012-09-08 LAB — MICROALBUMIN, URINE: Microalb, Ur: 5.12 mg/dL — ABNORMAL HIGH (ref 0.00–1.89)

## 2012-09-10 ENCOUNTER — Other Ambulatory Visit: Payer: Self-pay | Admitting: Family Medicine

## 2012-09-10 DIAGNOSIS — E119 Type 2 diabetes mellitus without complications: Secondary | ICD-10-CM

## 2012-09-10 MED ORDER — INSULIN GLARGINE 100 UNIT/ML ~~LOC~~ SOLN: 32.0000 [IU] | Freq: Every day | SUBCUTANEOUS | Status: DC

## 2012-09-10 NOTE — Telephone Encounter (Signed)
Pt called with current lab results.  Discussed change in insulin dose to 32u QHS.  Refill sent to pharmacy

## 2012-10-01 ENCOUNTER — Telehealth: Payer: Self-pay | Admitting: Physician Assistant

## 2012-10-01 DIAGNOSIS — I10 Essential (primary) hypertension: Secondary | ICD-10-CM

## 2012-10-01 DIAGNOSIS — E785 Hyperlipidemia, unspecified: Secondary | ICD-10-CM

## 2012-10-01 MED ORDER — ATORVASTATIN CALCIUM 40 MG PO TABS: 40.0000 mg | ORAL_TABLET | Freq: Every day | ORAL | Status: DC

## 2012-10-01 MED ORDER — LISINOPRIL 10 MG PO TABS: 10.0000 mg | ORAL_TABLET | Freq: Every day | ORAL | Status: DC

## 2012-10-01 NOTE — Telephone Encounter (Signed)
Medication refilled per protocol. 

## 2012-10-01 NOTE — Telephone Encounter (Signed)
ATORVASTATIN 40MG  TAKE 1 TABLET DAILY #30 LAST RF 09/07/12 LISINOPRIL 10 MG TAKE 1 TABLET DAILY #30 LAST RF 09/07/12

## 2012-10-03 ENCOUNTER — Other Ambulatory Visit: Payer: Self-pay | Admitting: Family Medicine

## 2012-10-03 DIAGNOSIS — Z9289 Personal history of other medical treatment: Secondary | ICD-10-CM

## 2012-10-10 DIAGNOSIS — N6459 Other signs and symptoms in breast: Secondary | ICD-10-CM | POA: Diagnosis not present

## 2012-10-12 ENCOUNTER — Telehealth: Payer: Self-pay | Admitting: Family Medicine

## 2012-10-12 DIAGNOSIS — E119 Type 2 diabetes mellitus without complications: Secondary | ICD-10-CM

## 2012-10-12 MED ORDER — METFORMIN HCL ER (MOD) 1000 MG PO TB24: 1000.0000 mg | ORAL_TABLET | Freq: Two times a day (BID) | ORAL | Status: DC

## 2012-10-12 NOTE — Telephone Encounter (Signed)
Rx Refilled  

## 2012-10-23 ENCOUNTER — Other Ambulatory Visit: Payer: Self-pay | Admitting: Radiology

## 2012-10-23 DIAGNOSIS — N6452 Nipple discharge: Secondary | ICD-10-CM

## 2012-10-25 ENCOUNTER — Encounter: Payer: Self-pay | Admitting: Physician Assistant

## 2012-10-29 ENCOUNTER — Ambulatory Visit
Admission: RE | Admit: 2012-10-29 | Discharge: 2012-10-29 | Disposition: A | Discharge status: Home / Self Care | Payer: Medicare Other | Source: Ambulatory Visit | Attending: Radiology | Admitting: Radiology

## 2012-10-29 DIAGNOSIS — N6459 Other signs and symptoms in breast: Secondary | ICD-10-CM | POA: Diagnosis not present

## 2012-10-29 DIAGNOSIS — N6452 Nipple discharge: Secondary | ICD-10-CM

## 2012-10-29 MED ORDER — GADOBENATE DIMEGLUMINE 529 MG/ML IV SOLN
19.0000 mL | Freq: Once | INTRAVENOUS | Status: AC | PRN
  Administered 2012-10-29: 19 mL via INTRAVENOUS

## 2012-10-31 ENCOUNTER — Other Ambulatory Visit: Payer: Self-pay | Admitting: Radiology

## 2012-10-31 DIAGNOSIS — R928 Other abnormal and inconclusive findings on diagnostic imaging of breast: Secondary | ICD-10-CM

## 2012-11-06 ENCOUNTER — Encounter: Payer: Self-pay | Admitting: Physician Assistant

## 2012-11-09 ENCOUNTER — Ambulatory Visit
Admission: RE | Admit: 2012-11-09 | Discharge: 2012-11-09 | Disposition: A | Discharge status: Home / Self Care | Payer: Medicare Other | Source: Ambulatory Visit | Attending: Radiology | Admitting: Radiology

## 2012-11-09 DIAGNOSIS — N6489 Other specified disorders of breast: Secondary | ICD-10-CM | POA: Diagnosis not present

## 2012-11-09 DIAGNOSIS — D0590 Unspecified type of carcinoma in situ of unspecified breast: Secondary | ICD-10-CM | POA: Diagnosis not present

## 2012-11-09 DIAGNOSIS — R928 Other abnormal and inconclusive findings on diagnostic imaging of breast: Secondary | ICD-10-CM

## 2012-11-09 MED ORDER — GADOBENATE DIMEGLUMINE 529 MG/ML IV SOLN
19.0000 mL | Freq: Once | INTRAVENOUS | Status: AC | PRN
  Administered 2012-11-09: 19 mL via INTRAVENOUS

## 2012-11-13 ENCOUNTER — Encounter: Payer: Self-pay | Admitting: Physician Assistant

## 2012-11-21 ENCOUNTER — Other Ambulatory Visit: Payer: Self-pay | Admitting: Radiology

## 2012-11-21 DIAGNOSIS — R928 Other abnormal and inconclusive findings on diagnostic imaging of breast: Secondary | ICD-10-CM

## 2012-11-27 ENCOUNTER — Other Ambulatory Visit: Payer: Self-pay | Admitting: Internal Medicine

## 2012-11-27 ENCOUNTER — Ambulatory Visit (INDEPENDENT_AMBULATORY_CARE_PROVIDER_SITE_OTHER): Payer: Self-pay | Admitting: General Surgery

## 2012-11-27 ENCOUNTER — Encounter (INDEPENDENT_AMBULATORY_CARE_PROVIDER_SITE_OTHER): Payer: Self-pay | Admitting: General Surgery

## 2012-11-27 ENCOUNTER — Ambulatory Visit (INDEPENDENT_AMBULATORY_CARE_PROVIDER_SITE_OTHER): Payer: Medicare Other | Admitting: General Surgery

## 2012-11-27 VITALS — BP 112/72 | HR 65 | Temp 98.0°F | Resp 16 | Ht 64.0 in | Wt 205.6 lb

## 2012-11-27 DIAGNOSIS — D0590 Unspecified type of carcinoma in situ of unspecified breast: Secondary | ICD-10-CM | POA: Diagnosis not present

## 2012-11-27 DIAGNOSIS — D0512 Intraductal carcinoma in situ of left breast: Secondary | ICD-10-CM

## 2012-11-28 NOTE — Progress Notes (Signed)
Patient ID: Kristen [name], female   DOB: 11-24-1945, 67 y.o.   MRN: [id number]  Chief Complaint  Patient presents with  . New Evaluation    eval l br ca    HPI [name] is a 67 y.o. female.  referred by Dr Tilda Burrow                                                                     HPI 23 yof who had spontaneous bloody left nipple discharge beginning about a month ago.  No other complaints.  She has fh of mother who is deceased from breast cancer at age 74.She presented and underwent evaluation with mm that shows a small well-circumscribed mass in the left breast.  There is some nonmass asymmetric density in subareolar and lateral subareolar region.  [place] in 9 o'clock position at areolar margin shows a 4-6 mm mass.  She then underwent attempt at ductogram that was unsuccessful.  Breast MR was performed showing a 5.3x2.7x1.7 cm area of clumped linear enhancement from nipple to midportion of the breast.  She then underwent mr biopsy of part of this lesion showing dcis 100/100.  She comes in today to discuss her options.   Past Medical History  Diagnosis Date  . Diabetes mellitus   . Heart disease   . Hyperlipidemia   . Allergy   . Anxiety   . Depression   . Arthritis   . Acute angina   . Hypertension   . Hemorrhoids   . CAD (coronary artery disease)   . Obesity   . Overactive bladder   . Vitamin D deficiency   . GERD (gastroesophageal reflux disease)     Past Surgical History  Procedure Laterality Date  . Oophorectomy    . Replacement total knee      both knees  . Back surgery    . Partial hip arthroplasty      left hip  . Cholecystectomy, laparoscopic    . Lithotripsy    . Cesarean section    . Joint replacement    . Cholecystectomy      Family History  Problem Relation Age of Onset  . Cancer Mother     mouth cancer  . Lung cancer Brother   . Lung cancer Brother   . Lung cancer Brother     Social History History  Substance Use Topics  . Smoking status: Never  Smoker   . Smokeless tobacco: Never Used  . Alcohol Use: No    Allergies  Allergen Reactions  . Sulfonamide Derivatives Hives    Current Outpatient Prescriptions  Medication Sig Dispense Refill  . aspirin 81 MG tablet Take 81 mg by mouth daily.        Marland Kitchen atorvastatin (LIPITOR) 40 MG tablet Take 1 tablet (40 mg total) by mouth daily.  30 tablet  5  . Cholecalciferol (VITAMIN D) 2000 UNITS tablet Take 2,000 Units by mouth daily.        Tery Sanfilippo Calcium (STOOL SOFTENER PO) Take 1 capsule by mouth 2 (two) times daily.        Marland Kitchen ezetimibe (ZETIA) 10 MG tablet Take 1 tablet (10 mg total) by mouth daily.  30 tablet  5  . FLUoxetine (PROZAC) 40 MG  capsule Take 1 capsule (40 mg total) by mouth daily.  30 capsule  5  . insulin glargine (LANTUS SOLOSTAR) 100 UNIT/ML injection Inject 0.32 mLs (32 Units total) into the skin at bedtime.  10 mL  5  . lisinopril (PRINIVIL,ZESTRIL) 10 MG tablet Take 1 tablet (10 mg total) by mouth daily.  30 tablet  5  . metFORMIN (GLUMETZA) 1000 MG (MOD) 24 hr tablet Take 1 tablet (1,000 mg total) by mouth 2 (two) times daily.  30 tablet  5  . omeprazole (PRILOSEC) 20 MG capsule Take 1 capsule (20 mg total) by mouth 2 (two) times daily.  30 capsule  5  . oxybutynin (DITROPAN) 5 MG tablet Take 1 tablet (5 mg total) by mouth 2 (two) times daily.  60 tablet  5  . [DISCONTINUED] oxybutynin (DITROPAN) 5 MG tablet Take 5 mg by mouth 2 (two) times daily.         No current facility-administered medications for this visit.    Review of Systems Review of Systems  Constitutional: Negative for fever, chills and unexpected weight change.  HENT: Negative for hearing loss, congestion, sore throat, trouble swallowing and voice change.   Eyes: Negative for visual disturbance.  Respiratory: Negative for cough and wheezing.   Cardiovascular: Negative for chest pain, palpitations and leg swelling.  Gastrointestinal: Negative for nausea, vomiting, abdominal pain, diarrhea,  constipation, blood in stool, abdominal distention and anal bleeding.  Genitourinary: Negative for hematuria, vaginal bleeding and difficulty urinating.  Musculoskeletal: Negative for arthralgias.  Skin: Negative for rash and wound.  Neurological: Negative for seizures, syncope and headaches.  Hematological: Negative for adenopathy. Does not bruise/bleed easily.  Psychiatric/Behavioral: Negative for confusion.    Blood pressure 112/72, pulse 65, temperature 98 F (36.7 C), temperature source Temporal, resp. rate 16, height 5\' 4"  (1.626 m), weight 205 lb 9.6 oz (93.26 kg).  Physical Exam Physical Exam  Constitutional: She appears well-developed and well-nourished.  Eyes: No scleral icterus.  Neck: Neck supple.  Cardiovascular: Normal rate, regular rhythm and normal heart sounds.   Pulmonary/Chest: Effort normal and breath sounds normal. She has no wheezes. She has no rales. Right breast exhibits no inverted nipple, no mass, no nipple discharge, no skin change and no tenderness. Left breast exhibits mass. Left breast exhibits no inverted nipple, no nipple discharge, no skin change and no tenderness.    Lymphadenopathy:    She has no cervical adenopathy.    She has no axillary adenopathy.       Right: No supraclavicular adenopathy present.       Left: No supraclavicular adenopathy present.    Data Reviewed BILATERAL BREAST MRI WITH AND WITHOUT CONTRAST  Technique: Multiplanar, multisequence MR images of both breasts  were obtained prior to and following the intravenous administration  of 19ml of MultiHance. Three dimensional images were evaluated at  the independent DynaCad workstation.  Comparison: Current and previous examinations at [facility] and the [facility], including  mammograms and ultrasound.  Findings: Minimal background parenchymal enhancement in both  breasts.  Clumped linear enhancement in the anterior aspect of the upper  outer  quadrant of the left breast, extending from the nipple to the  midportion of the breast. This area measures 5.3 x 2.7 x 1.7 cm in  maximum dimensions. This has low grade enhancement with persistent  and plateau kinetics.  No additional areas of enhancement suspicious for malignancy in  either breast. No abnormal appearing lymph nodes.  IMPRESSION:  5.3 x 2.7 x 1.7 cm area of clumped linear enhancement in a  segmental distribution in the upper outer quadrant of the left  breast extending from the nipple to the midportion of the breast.  This is highly suspicious for ductal carcinoma in situ. Therefore,  MR guided core needle biopsy is recommended.  RECOMMENDATION:  Left breast MR guided core needle biopsy.    Assessment    Left breast DCIS     Plan    MR biopsy of extent of possible disease prior to deciding on surgical therapy Genetics referral We discussed the staging and pathophysiology of breast cancer. We discussed all of the different options for treatment for breast cancer including surgery, chemotherapy, radiation therapy, Herceptin, and antiestrogen therapy.  She has stage 0 breast cancer currently.  We discussed a sentinel lymph node biopsy as she does not appear to having lymph node involvement right now. She has DCIS but if this is over 5 cm as appears on next biopsy I would recommend snbx with whatever breast surgery we do. We discussed the performance of that with injection of radioactive tracer and possibly blue dye. We discussed that she would have an incision underneath her axillary hairline if she has lumpectomy. We will await the permanent pathology to make any other first further decisions in terms of her treatment. One of these options might be to return to the operating room to perform an axillary lymph node dissection. We discussed about a 1-2% risk lifetime of chronic shoulder pain as well as lymphedema associated with a sentinel lymph node biopsy.  We discussed the  options for treatment of the breast cancer which included lumpectomy versus a mastectomy. We discussed the performance of the lumpectomy with a wire placement. This would need to be bracketed.  I think it is still possible to do this but if entire extent involved may consider mastectomy.We discussed a 10-20% chance of a positive margin requiring reexcision in the operating room. We also discussed that she may need radiation therapy or antiestrogen therapy or both if she undergoes lumpectomy. We discussed the mastectomy and the postoperative care for that as well. We discussed that there is no difference in her survival whether she undergoes lumpectomy with radiation therapy or antiestrogen therapy versus a mastectomy. There is a slight difference in the local recurrence rate being 3-5% with lumpectomy and about 1% with a mastectomy. We discussed the risks of operation including bleeding, infection, possible reoperation. She understands her further therapy will be based on what her stages at the time of her operation.          Cecely Rengel 11/28/2012, 11:20 AM

## 2012-11-29 ENCOUNTER — Ambulatory Visit
Admission: RE | Admit: 2012-11-29 | Discharge: 2012-11-29 | Disposition: A | Discharge status: Home / Self Care | Payer: Medicare Other | Source: Ambulatory Visit | Attending: Radiology | Admitting: Radiology

## 2012-11-29 DIAGNOSIS — D0590 Unspecified type of carcinoma in situ of unspecified breast: Secondary | ICD-10-CM | POA: Diagnosis not present

## 2012-11-29 DIAGNOSIS — N6489 Other specified disorders of breast: Secondary | ICD-10-CM | POA: Diagnosis not present

## 2012-11-29 DIAGNOSIS — R928 Other abnormal and inconclusive findings on diagnostic imaging of breast: Secondary | ICD-10-CM

## 2012-11-29 DIAGNOSIS — C50919 Malignant neoplasm of unspecified site of unspecified female breast: Secondary | ICD-10-CM | POA: Diagnosis not present

## 2012-11-29 MED ORDER — GADOBENATE DIMEGLUMINE 529 MG/ML IV SOLN
19.0000 mL | Freq: Once | INTRAVENOUS | Status: AC | PRN
  Administered 2012-11-29: 19 mL via INTRAVENOUS

## 2012-11-30 ENCOUNTER — Other Ambulatory Visit (INDEPENDENT_AMBULATORY_CARE_PROVIDER_SITE_OTHER): Payer: Self-pay | Admitting: General Surgery

## 2012-11-30 DIAGNOSIS — C50412 Malignant neoplasm of upper-outer quadrant of left female breast: Secondary | ICD-10-CM | POA: Insufficient documentation

## 2012-11-30 DIAGNOSIS — D0512 Intraductal carcinoma in situ of left breast: Secondary | ICD-10-CM

## 2012-11-30 HISTORY — DX: Malignant neoplasm of upper-outer quadrant of left female breast: C50.412

## 2012-12-10 ENCOUNTER — Encounter (HOSPITAL_COMMUNITY): Payer: Self-pay | Admitting: Pharmacy Technician

## 2012-12-10 ENCOUNTER — Ambulatory Visit (HOSPITAL_BASED_OUTPATIENT_CLINIC_OR_DEPARTMENT_OTHER): Payer: Medicare Other | Admitting: Genetic Counselor

## 2012-12-10 ENCOUNTER — Other Ambulatory Visit: Payer: Medicare Other

## 2012-12-10 ENCOUNTER — Encounter: Payer: Self-pay | Admitting: Genetic Counselor

## 2012-12-10 DIAGNOSIS — IMO0002 Reserved for concepts with insufficient information to code with codable children: Secondary | ICD-10-CM | POA: Diagnosis not present

## 2012-12-10 DIAGNOSIS — D0590 Unspecified type of carcinoma in situ of unspecified breast: Secondary | ICD-10-CM

## 2012-12-10 DIAGNOSIS — D0512 Intraductal carcinoma in situ of left breast: Secondary | ICD-10-CM

## 2012-12-10 NOTE — Progress Notes (Signed)
Dr.  Emelia Loron requested a consultation for genetic counseling and risk assessment for Kristen [name], a 67 y.o. female, for discussion of her personal and family history of breast cancer.  She presents to clinic today, with her daughter Kristen [name], to discuss the possibility of a genetic predisposition to cancer, and to further clarify her risks, as well as her family members' risks for cancer.   HISTORY OF PRESENT ILLNESS: In 2014, at the age of 41, Kristen [name] was diagnosed with DCIS of the left beast. This will be treated with mastectomy and radiation. The tumor is ER+ and PR+.     Past Medical History  Diagnosis Date  . Diabetes mellitus   . Heart disease   . Hyperlipidemia   . Allergy   . Anxiety   . Depression   . Arthritis   . Acute angina   . Hypertension   . Hemorrhoids   . CAD (coronary artery disease)   . Obesity   . Overactive bladder   . Vitamin D deficiency   . GERD (gastroesophageal reflux disease)     Past Surgical History  Procedure Laterality Date  . Oophorectomy    . Replacement total knee      both knees  . Back surgery    . Partial hip arthroplasty      left hip  . Cholecystectomy, laparoscopic    . Lithotripsy    . Cesarean section    . Joint replacement    . Cholecystectomy      History   Social History  . Marital Status: Widowed    Spouse Name: N/A    Number of Children: 2  . Years of Education: N/A   Social History Main Topics  . Smoking status: Never Smoker   . Smokeless tobacco: Never Used  . Alcohol Use: No  . Drug Use: No  . Sexually Active: None   Other Topics Concern  . None   Social History Narrative  . None    REPRODUCTIVE HISTORY AND PERSONAL RISK ASSESSMENT FACTORS: Menarche was at age 39.   postmenopausal Uterus Intact: yes, although a half of one ovary was removed because of benign cysts Ovaries Intact: yes G2P1A0, first live birth at age 50  She has not previously undergone treatment for infertility.    Oral Contraceptive use: 0 years   She has not used HRT in the past.    FAMILY HISTORY:  We obtained a detailed, 4-generation family history.  Significant diagnoses are listed below: Family History  Problem Relation Age of Onset  . Cancer Mother     mouth cancer  . Breast cancer Mother     possibly dx in her 40s  . Lung cancer Brother 54  . Lung cancer Brother   . Lung cancer Brother   . Heart attack Father 35  . Brain cancer Brother   The patient has two brothers who were smokers and died of have died of lung cancer, and one brother who is a smoker and had part of a lung removed b/c of lung cancer.  Another brother also died of a brain tumor.The patient's mother had breast cancer and died at age 16.  Her father died of a heart attack at age 42.  Patient's ancestors are of unknown descent. There is no reported Ashkenazi Jewish ancestry. There is no known consanguinity.  GENETIC COUNSELING RISK ASSESSMENT, DISCUSSION, AND SUGGESTED FOLLOW UP: We reviewed the natural history and genetic etiology of sporadic, familial and hereditary cancer  syndromes. We also discussed the characteristics, features and inheritance patterns of hereditary cancer syndromes. Based on the reported family history of cancer, Kristen [name] does not meet the medical criteria for genetic testing put in place by Medicare.  In order to estimate her chance of having a BRCA mutation, we used statistical models (Penn II) and laboratory data that take into account her personal medical history, family history and ancestry.  Because each model is different, there can be a lot of variability in the risks they give.  Therefore, these numbers must be considered a rough range and not a precise risk of having a BRCA mutation.  These models estimate that she has approximately a 4% chance of having a mutation. Based on this assessment of her family and personal history, genetic testing is no recommended.  After considering the risks,  benefits, and limitations, the patient provided declined testing based on not meeting medical criteria for testing.   The patient was seen for a total of 30 minutes, greater than 50% of which was spent face-to-face counseling.  This plan is being carried out per Dr. Kandyce Rud recommendations.  This note will also be sent to the referring provider via the electronic medical record. The patient will be supplied with a summary of this genetic counseling discussion as well as educational information on the discussed hereditary cancer syndromes following the conclusion of their visit.   Patient was discussed with Dr. Drue Second.   _______________________________________________________________________ For Office Staff:  Number of people involved in session: 3 Was an Intern/ student involved with case: yes

## 2012-12-11 ENCOUNTER — Encounter (INDEPENDENT_AMBULATORY_CARE_PROVIDER_SITE_OTHER): Payer: Self-pay | Admitting: General Surgery

## 2012-12-11 ENCOUNTER — Encounter (INDEPENDENT_AMBULATORY_CARE_PROVIDER_SITE_OTHER): Payer: Self-pay

## 2012-12-11 ENCOUNTER — Other Ambulatory Visit: Payer: Self-pay | Admitting: Physician Assistant

## 2012-12-11 ENCOUNTER — Ambulatory Visit (INDEPENDENT_AMBULATORY_CARE_PROVIDER_SITE_OTHER): Payer: Medicare Other | Admitting: General Surgery

## 2012-12-11 VITALS — BP 104/74 | HR 70 | Resp 18 | Ht 63.0 in | Wt 204.0 lb

## 2012-12-11 DIAGNOSIS — D0590 Unspecified type of carcinoma in situ of unspecified breast: Secondary | ICD-10-CM | POA: Diagnosis not present

## 2012-12-11 DIAGNOSIS — D0512 Intraductal carcinoma in situ of left breast: Secondary | ICD-10-CM

## 2012-12-11 NOTE — Progress Notes (Signed)
Subjective:     Patient ID: Kristen [name], female   DOB: [birth year], 67 y.o.   MRN: [id number]  HPI 36 yof who had left breast bloody nipple discharge and underwent mm that showed abnormal area.  She eventually has had an mr with results below that shows a larger area extending from nipple to uoq.  Both extents have been biopsies er/pr positive dcis.  She and I discussed this by phone and she related to me she would like to undergo mastectomy for this area.  She comes back in today without complaints.  Review of Systems BILATERAL BREAST MRI WITH AND WITHOUT CONTRAST  Technique: Multiplanar, multisequence MR images of both breasts  were obtained prior to and following the intravenous administration  of 19ml of MultiHance. Three dimensional images were evaluated at  the independent DynaCad workstation.  Comparison: Current and previous examinations at [facility] and the [facility], including  mammograms and ultrasound.  Findings: Minimal background parenchymal enhancement in both  breasts.  Clumped linear enhancement in the anterior aspect of the upper  outer quadrant of the left breast, extending from the nipple to the  midportion of the breast. This area measures 5.3 x 2.7 x 1.7 cm in  maximum dimensions. This has low grade enhancement with persistent  and plateau kinetics.  No additional areas of enhancement suspicious for malignancy in  either breast. No abnormal appearing lymph nodes.  IMPRESSION:  5.3 x 2.7 x 1.7 cm area of clumped linear enhancement in a  segmental distribution in the upper outer quadrant of the left  breast extending from the nipple to the midportion of the breast.  This is highly suspicious for ductal carcinoma in situ. Therefore,  MR guided core needle biopsy is recommended.      Objective:   Physical Exam Deferred today    Assessment:     Left breast dcis     Plan:     Left mastectomy including nipple/areola, left  axillary sentinel node biopsy  We have again discussed both biopsies as well as her MRI results. We went over her pathology results. This does appear to be ductal carcinoma in situ. I did discuss a bracketed lumpectomy that would include the nipple areolar complex. We discussed that this would be followed by radiation therapy. We discussed the other option would be a mastectomy and these would be equivalent in terms of local control. She very much would like to undergo mastectomy after this conversation. We discussed the risks and benefits as well as the postoperative recovery of a mastectomy. I also discussed a left axillary sentinel lymph node biopsy with her due to the fact she has DCIS in performing a mastectomy. We discussed the long-term risk of shoulder pain as well as lymphedema.

## 2012-12-17 ENCOUNTER — Ambulatory Visit (HOSPITAL_COMMUNITY)
Admission: RE | Admit: 2012-12-17 | Discharge: 2012-12-17 | Disposition: A | Discharge status: Home / Self Care | Payer: Medicare Other | Source: Ambulatory Visit | Attending: Anesthesiology | Admitting: Anesthesiology

## 2012-12-17 ENCOUNTER — Encounter (HOSPITAL_COMMUNITY): Payer: Self-pay

## 2012-12-17 ENCOUNTER — Encounter (HOSPITAL_COMMUNITY)
Admission: RE | Admit: 2012-12-17 | Discharge: 2012-12-17 | Disposition: A | Discharge status: Home / Self Care | Payer: Medicare Other | Source: Ambulatory Visit | Attending: General Surgery | Admitting: General Surgery

## 2012-12-17 DIAGNOSIS — Z01810 Encounter for preprocedural cardiovascular examination: Secondary | ICD-10-CM | POA: Insufficient documentation

## 2012-12-17 DIAGNOSIS — Z01812 Encounter for preprocedural laboratory examination: Secondary | ICD-10-CM | POA: Diagnosis not present

## 2012-12-17 DIAGNOSIS — I440 Atrioventricular block, first degree: Secondary | ICD-10-CM | POA: Insufficient documentation

## 2012-12-17 DIAGNOSIS — C50919 Malignant neoplasm of unspecified site of unspecified female breast: Secondary | ICD-10-CM | POA: Insufficient documentation

## 2012-12-17 DIAGNOSIS — I517 Cardiomegaly: Secondary | ICD-10-CM | POA: Diagnosis not present

## 2012-12-17 DIAGNOSIS — Z01818 Encounter for other preprocedural examination: Secondary | ICD-10-CM | POA: Insufficient documentation

## 2012-12-17 DIAGNOSIS — I452 Bifascicular block: Secondary | ICD-10-CM | POA: Insufficient documentation

## 2012-12-17 DIAGNOSIS — I4510 Unspecified right bundle-branch block: Secondary | ICD-10-CM | POA: Insufficient documentation

## 2012-12-17 LAB — COMPREHENSIVE METABOLIC PANEL
ALT: 35 U/L (ref 0–35)
AST: 23 U/L (ref 0–37)
Albumin: 3.9 g/dL (ref 3.5–5.2)
Alkaline Phosphatase: 67 U/L (ref 39–117)
Chloride: 101 mEq/L (ref 96–112)
Potassium: 5.2 mEq/L — ABNORMAL HIGH (ref 3.5–5.1)
Total Bilirubin: 0.3 mg/dL (ref 0.3–1.2)

## 2012-12-17 LAB — CANCER ANTIGEN 27.29: CA 27.29: 18 U/mL (ref 0–39)

## 2012-12-17 LAB — CBC WITH DIFFERENTIAL/PLATELET
Basophils Absolute: 0 10*3/uL (ref 0.0–0.1)
Basophils Relative: 0 % (ref 0–1)
Hemoglobin: 12.9 g/dL (ref 12.0–15.0)
MCHC: 33.5 g/dL (ref 30.0–36.0)
Neutro Abs: 3.6 10*3/uL (ref 1.7–7.7)
Neutrophils Relative %: 49 % (ref 43–77)
RDW: 13.8 % (ref 11.5–15.5)
WBC: 7.4 10*3/uL (ref 4.0–10.5)

## 2012-12-17 NOTE — Pre-Procedure Instructions (Addendum)
Kristen [name]  12/17/2012   Your procedure is scheduled on:  7.14.14  Report to [facility] at 930 AM.  Call this number if you have problems the morning of surgery: [phone]   Remember:   Do not eat food or drink liquids after midnight.   Take these medicines the morning of surgery with A SIP OF WATER: prozac,omeprazole, ditropan              STOP aspirin 7.9.14 unless inst otherwise by dr   Drucilla Schmidt not wear jewelry, make-up or nail polish.  Do not wear lotions, powders, or perfumes. You may wear deodorant.  Do not shave 48 hours prior to surgery. Men may shave face and neck.  Do not bring valuables to the hospital.  [facility] is not responsible                   for any belongings or valuables.  Contacts, dentures or bridgework may not be worn into surgery.  Leave suitcase in the car. After surgery it may be brought to your room.  For patients admitted to the hospital, checkout time is 11:00 AM the day of  discharge.   Patients discharged the day of surgery will not be allowed to drive  home.  Name and phone number of your driver:   Special Instructions: Shower using CHG 2 nights before surgery and the night before surgery.  If you shower the day of surgery use CHG.  Use special wash - you have one bottle of CHG for all showers.  You should use approximately 1/3 of the bottle for each shower.   Please read over the following fact sheets that you were given: Pain Booklet, Coughing and Deep Breathing and Surgical Site Infection Prevention

## 2012-12-17 NOTE — Progress Notes (Addendum)
Anesthesia PAT Evaluation:  Patient is a 67 year old female scheduled for a left mastectomy with sentinel LN biopsy on 12/24/12 by Dr. Dwain Sarna.    History includes non-smoker, DM2, HTN, HLD, chest pain with mild-moderate non-obstructive CAD by 2010 cath, anxiety, depression, GERD, overactive bladder, arthritis, obesity, vitamin D deficiency, prior left partial hip arthroplasty, bilateral TKR, cholecystectomy. PCP is Allayne Butcher, PA-C (previously Dr. Modesto Charon who has since relocated).  Patient has a visit for surgical clearance with Allayne Butcher, PA-C on 12/19/12.    Patient has previously been followed by cardiologist Dr. Valera Castle [facility]) for chest pain and CAD.  She has had multiple cardiac caths ('01, '05, '06, '08, '10) for chest pain that have showed non-obstructive CAD that did not require any intervention.  It appears she was last seen by Dr. Daleen Squibb in 2010 with an operative clearance note by him for a lumbar laminectomy/fusion in May 2011. She continues to experience occasional chest pain which she describes as a mild, dull, achy, "arthritis" feeling pain that occurs in the middle of her chest and occasional radiates to the center of her back (between her scapulas).  Pain typically occurs with great emotional stress and occasionally with exertion such as climbing a flight of stairs (although SOB is her predominant symptom with activity).  She was able to walk from her car to Short Stay without chest pain.  She reports the chest pain episodes only occur about once per year and go away within ~ 1 minutes after calming down or resting. Her last episode was "many months ago." She feels her symptoms have not changed in "years."  There is no associated diaphoresis, nausea, palpitations, dizziness. She has chronic DOE.  She does admit to not being very active.  Exam showed heart RRR, no murmur noted.  Lungs clear.  No carotid bruit auscultated.  No significant LE edema.  Echo on 08/12/09 showed: Left  ventricle: The cavity size was normal. Wall thickness was increased in a pattern of mild LVH. Systolic function was normal.The estimated ejection fraction was in the range of 55% to 60%. Wall motion was normal; there were no regional wall motion abnormalities.  Cardiac cath on 02/04/09 showed EF 60%, no significant MR, LM without critical disease, LAD with luminal irregularities including 40% segmental plaquing in between D1 and D2, focal 50% distal LAD, large ramus intermedius free of critical disease, proximal AV circumflex 20-30%, proximal RCA 30%, 40-50% mid RCA, PDA/PLA without critical narrowing.  Aggressive medical therapy was recommended at that time.  EKG on 12/17/12 showed SR with first degree AVB, LAD, LVH with QRS widening.  Overall, I think it appears stable since at least 02/20/09.  CXR on 12/17/12 showed normal heart size and pulmonary vascularity, chronic slight elevation of the right hemidiaphragm, LUL calcified granuloma, but no acute disease of the chest.  Preoperative labs noted.  K 5.2, Cr 1.02, glucose 129. CBC WNL.  Patient has had chronic chest pain with multiple cardiac caths since 2001 showing mild to moderate non-obstructive CAD with up to 50% distal LAD and 40-50 mid RCA in 2010. She feels her chest pain is rare and unchanged in the past several years.  She is diabetic which could make her symptoms more atypical, but overall she feels in her usual state of health.  She reports good DM control with an A1C of 7.0 on 09/07/12.  I have reviewed above with anesthesiologist Dr. Chaney Malling.  Since patient does not feel her symptoms have changed  in several years and her EKG appears stable, we will defer a decision for further cardiology evaluation to her PCP during her pre-operative evaluation later this week.  I will route my note to both Dr. Dwain Sarna and Durwin Nora, PA-C.  Also patient remains on ASA, so I will contact [facility] to have them contact patient if Dr. Dwain Sarna would like her to hold it  preoperatively.  [facility]/Anesthesiology Phone [phone] 12/17/2012 5:31 PM  Addendum: 12/21/12 12:40 PM Patient has since been evaluated by PCP Durwin Nora, PA-C and cardiologist Dr. Johney Frame.  PCP felt that if her A1C was stable then patient would be acceptable from a medical standpoint. (A1C was 7.1--previously 7.0, so it appears stable.)  Dr. Johney Frame had patient undergo a nuclear stress test on 12/20/12.  Overall impression: Intermediate risk stress nuclear study There is a defect of the apex that appears to be mostly due to breast attenuation but also a very small area of reversible ischemia. LV Ejection Fraction: 74%. LV Wall Motion: NL LV Function; NL Wall Motion. The apex contracts especially well. This was reviewed by Dr. Johney Frame who stated, "Pt with only a small area of reversibility and also breast attenuation. EF is preserved. I think that she will be low risk for surgery, particularly given low risk cath in 2010. She can therefore proceed with surgery if medically necessary as scheduled on Monday."  (There is question though if [facility] plans to move case to 01/08/13 since Dr. Dwain Sarna was initially thinking that Dr. Johney Frame may want to pursue abnormal stress test further.  Patient is hoping case can be put back on for 12/24/12 though.)

## 2012-12-17 NOTE — Progress Notes (Signed)
Spoke with allison pa to consult

## 2012-12-18 ENCOUNTER — Telehealth (INDEPENDENT_AMBULATORY_CARE_PROVIDER_SITE_OTHER): Payer: Self-pay

## 2012-12-18 DIAGNOSIS — C50912 Malignant neoplasm of unspecified site of left female breast: Secondary | ICD-10-CM

## 2012-12-18 NOTE — Telephone Encounter (Signed)
Called pt's daughter to notify her that I scheduled pt for an appt to see a cardiologist ASAP on 12/19/12 at 4:00 with Dr Johney Frame. The anesthesiologist is recommending the cardiac clearance along with medical clearance now before pt's surgery on 12/24/12. I advised the daughter that we are trying to get the pt cleared for her sx on 7/14 but there is a chance if she doesn't get cleared that we will have to r/s the surgery until we receive clearance. I also advised pt to stop her aspirin 5 days before her surgery per Dr Dwain Sarna.

## 2012-12-19 ENCOUNTER — Ambulatory Visit (INDEPENDENT_AMBULATORY_CARE_PROVIDER_SITE_OTHER): Payer: Medicare Other | Admitting: Internal Medicine

## 2012-12-19 ENCOUNTER — Ambulatory Visit (INDEPENDENT_AMBULATORY_CARE_PROVIDER_SITE_OTHER): Payer: Medicare Other | Admitting: Physician Assistant

## 2012-12-19 ENCOUNTER — Encounter: Payer: Self-pay | Admitting: Internal Medicine

## 2012-12-19 ENCOUNTER — Encounter: Payer: Self-pay | Admitting: Physician Assistant

## 2012-12-19 VITALS — BP 106/60 | HR 71 | Ht 63.0 in | Wt 206.4 lb

## 2012-12-19 VITALS — BP 122/86 | HR 60 | Temp 97.5°F | Resp 18 | Ht 63.0 in | Wt 206.0 lb

## 2012-12-19 DIAGNOSIS — D0590 Unspecified type of carcinoma in situ of unspecified breast: Secondary | ICD-10-CM

## 2012-12-19 DIAGNOSIS — I2510 Atherosclerotic heart disease of native coronary artery without angina pectoris: Secondary | ICD-10-CM

## 2012-12-19 DIAGNOSIS — R0602 Shortness of breath: Secondary | ICD-10-CM

## 2012-12-19 DIAGNOSIS — I10 Essential (primary) hypertension: Secondary | ICD-10-CM | POA: Diagnosis not present

## 2012-12-19 DIAGNOSIS — F341 Dysthymic disorder: Secondary | ICD-10-CM

## 2012-12-19 DIAGNOSIS — E785 Hyperlipidemia, unspecified: Secondary | ICD-10-CM

## 2012-12-19 DIAGNOSIS — E669 Obesity, unspecified: Secondary | ICD-10-CM

## 2012-12-19 DIAGNOSIS — E119 Type 2 diabetes mellitus without complications: Secondary | ICD-10-CM | POA: Diagnosis not present

## 2012-12-19 DIAGNOSIS — Z01818 Encounter for other preprocedural examination: Secondary | ICD-10-CM | POA: Diagnosis not present

## 2012-12-19 DIAGNOSIS — R079 Chest pain, unspecified: Secondary | ICD-10-CM

## 2012-12-19 DIAGNOSIS — D0512 Intraductal carcinoma in situ of left breast: Secondary | ICD-10-CM

## 2012-12-19 DIAGNOSIS — Z01810 Encounter for preprocedural cardiovascular examination: Secondary | ICD-10-CM

## 2012-12-19 LAB — LIPID PANEL
HDL: 34 mg/dL — ABNORMAL LOW (ref >39)
LDL Cholesterol: 59 mg/dL (ref 0–99)
Total CHOL/HDL Ratio: 3.5 Ratio

## 2012-12-19 NOTE — Progress Notes (Signed)
Primary Care Physician: Kristen Grosser, MD Referring Physician:  Dr Kristen [name] Kristen [name] is a 67 y.o. female with a h/o hypertension, DM, and nonobstructive CAD who presents for preoperative clearance prior to mastectomy.  She is felt to have breast cancer and is planned for mastectomy.   She has done quite well from a CAD standpoint.  She underwent cath 01/2009 which revealed mild scattered nonobstructive CAD.  She has had atypical chest pain previously.  She reports that she develops SOB with moderate.  She has occasional chest pain with exertion.  She is physically not very active however.  Today, she denies symptoms of palpitations,  orthopnea, PND, lower extremity edema, dizziness, presyncope, syncope, or neurologic sequela. The patient is tolerating medications without difficulties and is otherwise without complaint today.   Past Medical History  Diagnosis Date  . Diabetes mellitus   . Coronary artery disease, non-occlusive     cath 2010 by Dr Kristen [name] revealed scattered nonobstrructive disease with preserved EF  . Hyperlipidemia   . Allergy   . Anxiety   . Depression   . Arthritis   . Hypertension   . Hemorrhoids   . Obesity   . Overactive bladder   . Vitamin D deficiency   . GERD (gastroesophageal reflux disease)   . Cancer     breast   Past Surgical History  Procedure Laterality Date  . Oophorectomy Right     1.5  ovaries rem  . Replacement total knee      both knees  . Back surgery    . Partial hip arthroplasty      left hip  . Cholecystectomy, laparoscopic    . Lithotripsy    . Cesarean section    . Joint replacement    . Cholecystectomy    . Tonsillectomy    . Appendectomy    . Cardiac catheterization  01/2009    cath by Dr Kristen [name] revealed nonobstructive CAD    Current Outpatient Prescriptions  Medication Sig Dispense Refill  . aspirin EC 81 MG tablet Take 81 mg by mouth daily.      Marland Kitchen atorvastatin (LIPITOR) 40 MG tablet Take 40 mg by mouth daily.       Kristen [name] Calcium (STOOL SOFTENER PO) Take 1 capsule by mouth 2 (two) times daily.       Marland Kitchen ezetimibe (ZETIA) 10 MG tablet Take 10 mg by mouth daily.      Marland Kitchen FLUoxetine (PROZAC) 40 MG capsule Take 40 mg by mouth daily.      Marland Kitchen ibuprofen (ADVIL,MOTRIN) 200 MG tablet Take 200 mg by mouth every 6 (six) hours as needed for pain.      Marland Kitchen insulin glargine (LANTUS) 100 UNIT/ML injection Inject 32 Units into the skin at bedtime.      Marland Kitchen lisinopril (PRINIVIL,ZESTRIL) 10 MG tablet Take 10 mg by mouth daily.      . metFORMIN (GLUCOPHAGE) 1000 MG tablet       . omeprazole (PRILOSEC) 20 MG capsule Take 20 mg by mouth 2 (two) times daily.      Marland Kitchen oxybutynin (DITROPAN) 5 MG tablet Take 5 mg by mouth 2 (two) times daily.      . [DISCONTINUED] oxybutynin (DITROPAN) 5 MG tablet Take 5 mg by mouth 2 (two) times daily.         No current facility-administered medications for this visit.    Allergies  Allergen Reactions  . Sulfonamide Derivatives Hives    History   Social  History  . Marital Status: Widowed    Spouse Name: N/A    Number of Children: 2  . Years of Education: N/A   Occupational History  . Not on file.   Social History Main Topics  . Smoking status: Never Smoker   . Smokeless tobacco: Never Used  . Alcohol Use: No  . Drug Use: No  . Sexually Active: Not on file   Other Topics Concern  . Not on file   Social History Narrative   Lives in [place] with daughter.   Retired    Family History  Problem Relation Age of Onset  . Cancer Mother     mouth cancer  . Breast cancer Mother     possibly dx in her 77s  . Lung cancer Brother 65  . Lung cancer Brother   . Lung cancer Brother   . Heart attack Father 47  . Brain cancer Brother     ROS- All systems are reviewed and negative except as per the HPI above  Physical Exam: Filed Vitals:   12/19/12 1602  BP: 106/60  Pulse: 71  Height: 5\' 3"  (1.6 m)  Weight: 206 lb 6.4 oz (93.622 kg)    GEN- The patient is well  appearing, alert and oriented x 3 today.   Head- normocephalic, atraumatic Eyes-  Sclera clear, conjunctiva pink Ears- hearing intact Oropharynx- clear Neck- supple, no JVP Lymph- no cervical lymphadenopathy Lungs- Clear to ausculation bilaterally, normal work of breathing Heart- Regular rate and rhythm, no murmurs, rubs or gallops, PMI not laterally displaced GI- soft, NT, ND, + BS Extremities- no clubbing, cyanosis, or edema MS- no significant deformity or atrophy Skin- no rash or lesion Psych- euthymic mood, full affect Neuro- strength and sensation are intact  EKG 12/17/12 reveals sinus rhythm with RBBB, LAHB, LVH Echo 2011 reviewed  Assessment and Plan:  1. Chest pain and shortness of breath Both typical and atypical features. She has multiple CAD risk factors including sedentary lifestyle, diabetes, hypertension, and HL.  She is not very active and therefore it is difficult to assess symptoms with exertion.  I think that prior to proceeding with surgery, she should have a lexiscan myoview (She is unable to exercise due to prior knee surgery).  IF low risk, then medical therapy is advised.  If high risk, then further CV testing may become necessary.  2. CAD As above Nonobstructive by cath 2010 (reviewed) Continue ASA, lipitor, and zetia.  3. Obesity Weight loss advised  4. Pre-op As above If low risk myoview, then ok to proceed with surgery as scheduled

## 2012-12-19 NOTE — Patient Instructions (Addendum)
Your physician wants you to follow-up in:   YEAR  You will receive a reminder letter in the mail two months in advance. If you don't receive a letter, please call our office to schedule the follow-up appointment. Your physician recommends that you continue on your current medications as directed. Please refer to the Current Medication list given to you today. Your physician has requested that you have a lexiscan myoview. For further information please visit [email or web address] Please follow instruction sheet, as given.

## 2012-12-19 NOTE — Progress Notes (Signed)
Patient ID: Kristen [name] MRN: [id number], DOB: 1945/08/06, 67 y.o. Date of Encounter: @DATE @  Chief Complaint:  Chief Complaint  Patient presents with  . needs surgical clearance for mastectomy    HPI: 67 y.o. year old white female  presents for preop clearance. Also, her last ROV and routine labs here were done 09/07/12 so she is also due for ROV, routine labs.   1-PreOp Eval: She had left breast blody discharge. She subsequently had mammogram and MRI and has had biopsies. These revealed ductal carcinoma in situ. She had OV with Dr. Dwain Sarna 12/11/12 at which time treatment options wre discussed. She has opted to proceed with mastectomy. This has been scheduled. As well, she has been scheduled for preop evaluations.  I have reviewed, in Epic, a very thorough note by Shonna Chock, PA with Anaesthesia dated 12/17/12.Marland Kitchen Her note includes pts past cardiac caths and Echos. Also, pt tells me she has appt lateer today with Cardiology for preop clearance. Therefore, I will not review this further here. As well, she already had EKG 12/17/12.  From non-cardiac, medical standpoint:   2-DM: Her last A1C was 09/07/12. I will repeat this now. She is taking dibetic meds as directed with no adv effects.  3-HTN: She is taking BP meds as directed. No adv effects. No LE edema. 4-HLD: She is taking med as directed. No myalgias. No other adv effects.    Past Medical History  Diagnosis Date  . Diabetes mellitus   . Heart disease   . Hyperlipidemia   . Allergy   . Anxiety   . Depression   . Arthritis   . Acute angina   . Hypertension   . Hemorrhoids   . CAD (coronary artery disease)   . Obesity   . Overactive bladder   . Vitamin D deficiency   . GERD (gastroesophageal reflux disease)   . Cancer     breast     Home Meds: See attached medication section for current medication list. Any medications entered into computer today will not appear on this note's list. The medications listed below were  entered prior to today. Current Outpatient Prescriptions on File Prior to Visit  Medication Sig Dispense Refill  . aspirin EC 81 MG tablet Take 81 mg by mouth daily.      Marland Kitchen atorvastatin (LIPITOR) 40 MG tablet Take 40 mg by mouth daily.      Tery Sanfilippo Calcium (STOOL SOFTENER PO) Take 1 capsule by mouth 2 (two) times daily.       Marland Kitchen ezetimibe (ZETIA) 10 MG tablet Take 10 mg by mouth daily.      Marland Kitchen FLUoxetine (PROZAC) 40 MG capsule Take 40 mg by mouth daily.      Marland Kitchen ibuprofen (ADVIL,MOTRIN) 200 MG tablet Take 200 mg by mouth every 6 (six) hours as needed for pain.      Marland Kitchen insulin glargine (LANTUS) 100 UNIT/ML injection Inject 32 Units into the skin at bedtime.      Marland Kitchen lisinopril (PRINIVIL,ZESTRIL) 10 MG tablet Take 10 mg by mouth daily.      . metFORMIN (GLUCOPHAGE) 1000 MG tablet       . omeprazole (PRILOSEC) 20 MG capsule Take 20 mg by mouth 2 (two) times daily.      Marland Kitchen oxybutynin (DITROPAN) 5 MG tablet Take 5 mg by mouth 2 (two) times daily.      . [DISCONTINUED] oxybutynin (DITROPAN) 5 MG tablet Take 5 mg by mouth 2 (two) times daily.  No current facility-administered medications on file prior to visit.    Allergies:  Allergies  Allergen Reactions  . Sulfonamide Derivatives Hives    History   Social History  . Marital Status: Widowed    Spouse Name: N/A    Number of Children: 2  . Years of Education: N/A   Occupational History  . Not on file.   Social History Main Topics  . Smoking status: Never Smoker   . Smokeless tobacco: Never Used  . Alcohol Use: No  . Drug Use: No  . Sexually Active: Not on file   Other Topics Concern  . Not on file   Social History Narrative  . No narrative on file    Family History  Problem Relation Age of Onset  . Cancer Mother     mouth cancer  . Breast cancer Mother     possibly dx in her 84s  . Lung cancer Brother 87  . Lung cancer Brother   . Lung cancer Brother   . Heart attack Father 69  . Brain cancer Brother       Review of Systems:  See HPI for pertinent ROS. All other ROS negative.    Physical Exam: Blood pressure 122/86, pulse 60, temperature 97.5 F (36.4 C), temperature source Oral, resp. rate 18, height 5\' 3"  (1.6 m), weight 206 lb (93.441 kg)., Body mass index is 36.5 kg/(m^2). General: Obese WF. Appears in no acute distress. Neck: Supple. No thyromegaly. No lymphadenopathy.No carotid bruits. Lungs: Clear bilaterally to auscultation without wheezes, rales, or rhonchi. Breathing is unlabored. Heart: RRR with S1 S2. No murmurs, rubs, or gallops. Abdomen: Soft, non-tender, non-distended with normoactive bowel sounds. No hepatomegaly. No rebound/guarding. No obvious abdominal masses. Musculoskeletal:  Strength and tone normal for age. Extremities/Skin: Warm and dry. No clubbing or cyanosis. No edema. No rashes or suspicious lesions. Neuro: Alert and oriented X 3. Moves all extremities spontaneously. Gait is normal. CNII-XII grossly in tact. Psych:  Responds to questions appropriately with a normal affect. Diabetic Foot Exam: See attached section. documented there.     ASSESSMENT AND PLAN:  67 y.o. year old female with  1. Preoperative examination See extensive note by Anaesthesia PA 12/17/12 in epic, and see HPI.  2. DCIS (ductal carcinoma in situ) of breast, left Scheduled for mastectomy pending Cardiology preop eval later today.  3. CAD, UNSPECIFIED SITE   4. DIABETES MELLITUS, TYPE II Last A1C 09/07/12. Recheck now.  She had microAlbumin 09/07/12. - Hemoglobin A1c  5. HYPERLIPIDEMIA On statin. echeck flp now. CMET done 12/17/12, nml LFTs. - Lipid panel  6. HYPERTENSION, UNSPECIFIED At goal. On ACE inh.Had BMET 12/17/12. Stable.  7. DEPRESSION/ANXIETY Controlled.   8. OBESITY Pt has been educated reg diet exercise multiple times.   If her A1C result is stable, then she is stable to undergo surgery from medical standpoint pending Cardiology evaluation later today.  She had CBC,  CMET , EKG 12/17/12.   Signed, [address] [place], [place], [facility] 12/19/2012 10:44 AM

## 2012-12-20 ENCOUNTER — Telehealth: Payer: Self-pay | Admitting: Family Medicine

## 2012-12-20 ENCOUNTER — Ambulatory Visit (HOSPITAL_COMMUNITY): Payer: Medicare Other | Attending: Internal Medicine | Admitting: Radiology

## 2012-12-20 VITALS — BP 111/76 | Ht 63.0 in | Wt 204.0 lb

## 2012-12-20 DIAGNOSIS — Z8249 Family history of ischemic heart disease and other diseases of the circulatory system: Secondary | ICD-10-CM | POA: Diagnosis not present

## 2012-12-20 DIAGNOSIS — E119 Type 2 diabetes mellitus without complications: Secondary | ICD-10-CM | POA: Diagnosis not present

## 2012-12-20 DIAGNOSIS — R0602 Shortness of breath: Secondary | ICD-10-CM

## 2012-12-20 DIAGNOSIS — I10 Essential (primary) hypertension: Secondary | ICD-10-CM | POA: Diagnosis not present

## 2012-12-20 DIAGNOSIS — R5381 Other malaise: Secondary | ICD-10-CM | POA: Diagnosis not present

## 2012-12-20 DIAGNOSIS — R0609 Other forms of dyspnea: Secondary | ICD-10-CM | POA: Diagnosis not present

## 2012-12-20 DIAGNOSIS — R079 Chest pain, unspecified: Secondary | ICD-10-CM | POA: Insufficient documentation

## 2012-12-20 DIAGNOSIS — I2510 Atherosclerotic heart disease of native coronary artery without angina pectoris: Secondary | ICD-10-CM

## 2012-12-20 DIAGNOSIS — Z794 Long term (current) use of insulin: Secondary | ICD-10-CM | POA: Diagnosis not present

## 2012-12-20 DIAGNOSIS — R0989 Other specified symptoms and signs involving the circulatory and respiratory systems: Secondary | ICD-10-CM | POA: Insufficient documentation

## 2012-12-20 MED ORDER — REGADENOSON 0.4 MG/5ML IV SOLN
0.4000 mg | Freq: Once | INTRAVENOUS | Status: AC
  Administered 2012-12-20: 0.4 mg via INTRAVENOUS

## 2012-12-20 MED ORDER — TECHNETIUM TC 99M SESTAMIBI GENERIC - CARDIOLITE
33.0000 | Freq: Once | INTRAVENOUS | Status: AC | PRN
  Administered 2012-12-20: 33 via INTRAVENOUS

## 2012-12-20 MED ORDER — TECHNETIUM TC 99M SESTAMIBI GENERIC - CARDIOLITE
11.0000 | Freq: Once | INTRAVENOUS | Status: AC | PRN
  Administered 2012-12-20: 11 via INTRAVENOUS

## 2012-12-20 NOTE — Telephone Encounter (Signed)
Spoke with daughter.  Told about lab results and adjustment to lantus insulin.  No other med changes.  Continue to monitor blood sugars and let us know if any problem with insulin increase

## 2012-12-20 NOTE — Telephone Encounter (Signed)
Message copied by Donne Anon on Thu Dec 20, 2012  9:49 AM ------      Message from: Allayne Butcher      Created: Wed Dec 19, 2012  7:41 PM       A1C is 7.1-just a little high. May need to increase lantus by 1-2 units and monitor BS.       Cholesterol is excellent with current meds. Cont all other meds the same-just try to increase lantus. ------

## 2012-12-20 NOTE — Progress Notes (Signed)
[facility] 3 NUCLEAR MED [address] [place], [place] [address] [phone]    Cardiology Nuclear Med Study  Kristen [name] is a 67 y.o. female     MRN : [id number]     DOB: 08-Oct-1945  Procedure Date: 12/20/2012  Nuclear Med Background Indication for Stress Test:  Evaluation for Ischemia and Pending Surgical Clearance: Left mastectomy- Dr. Dwain Sarna, 12/24/12 History: 2011 Echo EF=55-60%,mild LVH;8/10' Heart Catheteriazation mild, scattered non obst.dz;2011? Myocardial Perfusion Study ,nl per pt Cardiac Risk Factors: Family History - CAD, Hypertension, IDDM Type 2 and Lipids  Symptoms:  Chest Pain with Exertion (last date of chest discomfort occasional), DOE and Fatigue   Nuclear Pre-Procedure Caffeine/Decaff Intake:  None NPO After: 10:00pm   Lungs:  clear O2 Sat: 96% on room air. IV 0.9% NS with Angio Cath:  22g  IV Site: R Hand  IV Started by:  Stanton Kidney, EMT-P  Chest Size (in):  40 Cup Size: B  Height: 5\' 3"  (1.6 m)  Weight:  204 lb (92.534 kg)  BMI:  Body mass index is 36.15 kg/(m^2). Tech Comments:  CBG= 101 at 8am, per patient.    Nuclear Med Study 1 or 2 day study: 1 day  Stress Test Type:  Eugenie Birks  Reading MD: Kristeen Miss, MD  Order Authorizing Provider:  Jarold Song  Resting Radionuclide: Technetium 39m Sestamibi  Resting Radionuclide Dose: 11.0 mCi   Stress Radionuclide:  Technetium 68m Sestamibi  Stress Radionuclide Dose: 33.0 mCi           Stress Protocol Rest HR: 61 Stress HR: 86  Rest BP: 111/76 Stress BP: 124/64  Exercise Time (min): n/a METS: n/a   Predicted Max HR: 153 bpm % Max HR: 56.21 bpm Rate Pressure Product: 29528   Dose of Adenosine (mg):  n/a Dose of Lexiscan: 0.4 mg  Dose of Atropine (mg): n/a Dose of Dobutamine: n/a mcg/kg/min (at max HR)  Stress Test Technologist: Frederick Peers, EMT-P  Nuclear Technologist:  Doyne Keel, CNMT     Rest Procedure:  Myocardial perfusion imaging was performed at rest 45 minutes  following the intravenous administration of Technetium 21m Sestamibi. Rest ECG: NSR - Normal EKG  Stress Procedure:  The patient received IV Lexiscan 0.4 mg over 15-seconds.  Technetium 62m Sestamibi injected at 30-seconds.  Quantitative spect images were obtained after a 45 minute delay. Stress ECG: No significant change from baseline ECG  QPS Raw Data Images:  There is a breast shadow that accounts for the anterior attenuation. Stress Images:  There is a moderately severe, medium sized defect in the apex.   Rest Images:  There is a small sized  are of attenuation of the apex.  Subtraction (SDS):  There is attenuation of the apex.  I suspect that most of the attenuation is due to breast artifact but there appears to be  a small area of reversible ischemia as well.  Transient Ischemic Dilatation (Normal <1.22):  n/a Lung/Heart Ratio (Normal <0.45):  0.41  Quantitative Gated Spect Images QGS EDV:  74 ml QGS ESV:  19 ml  Impression Exercise Capacity:  Lexiscan with no exercise. BP Response:  Normal blood pressure response. Clinical Symptoms:  No significant symptoms noted. ECG Impression:  No significant ST segment change suggestive of ischemia. Comparison with Prior Nuclear Study: No images to compare  Overall Impression:  Intermediate risk stress nuclear study There is a defect of the apex that appears to be mostly due to breast attenuation but also a  very small area of reversible ischemia.  .  LV Ejection Fraction: 74%.  LV Wall Motion:  NL LV Function; NL Wall Motion.  The apex contracts especially well.    Vesta Mixer, Montez Hageman., MD, [facility] 12/20/2012, 4:52 PM Office - [phone] Pager [phone]

## 2012-12-21 ENCOUNTER — Telehealth (INDEPENDENT_AMBULATORY_CARE_PROVIDER_SITE_OTHER): Payer: Self-pay | Admitting: *Deleted

## 2012-12-21 ENCOUNTER — Telehealth: Payer: Self-pay | Admitting: *Deleted

## 2012-12-21 NOTE — Telephone Encounter (Signed)
This pt has been cleared from a cardiac standpoint for surgery:  Notes Recorded by Hillis Range, MD on 12/21/2012 at 11:36 AM Pt with only a small area of reversibility and also breast attenuation. EF is preserved.  I think that she will be low risk for surgery, particularly given low risk cath in 2010.  She can therefore proceed with surgery if medically necessary as scheduled on Monday.      I contacted the pt's daughter and made her aware of this information and she would like the pt's surgery to be rescheduled back to 12/24/12.

## 2012-12-21 NOTE — Telephone Encounter (Signed)
I spoke with pt daughter today. Dr. Daleen Squibb recommended pt follow-up this fall with Dr. Delton See Daughter is aware Dr. Delton See is not in the office as of yet but a reminder letter will be sent to make an appointment Mylo Red RN

## 2012-12-21 NOTE — Telephone Encounter (Signed)
Received a call from Lauren at Dr. Harvie Bridge office to state that cardiac clearance was given in the notes at the bottom of the Myocardial Perfusion Imaging.  Alisha updated at this time.

## 2012-12-21 NOTE — Telephone Encounter (Signed)
Called pt's daughter to inform her that the pt's stress test did come back abnormal so we are going to r/s the surgery from 7/14 to the end of the month per  Dr Dwain Sarna. I will cx the surgery for Monday per DR Dwain Sarna and notify our surgery schedulers. The pt should be getting a call from [facility] to discuss having cardiology clear her for surgery now. The pt understands.

## 2012-12-21 NOTE — Telephone Encounter (Signed)
New problem   Kristen [name]/[facility] need nurse to call Kristen [name] about pt's myocardial perfusion test that was done 12/20/12. Pt's breast sx is set for 12/24/12. Please call Kristen [name].

## 2012-12-21 NOTE — Telephone Encounter (Signed)
Dr Doreen Salvage office has been notified that the pt is cleared for surgery.

## 2012-12-23 MED ORDER — CEFAZOLIN SODIUM-DEXTROSE 2-3 GM-% IV SOLR
2.0000 g | INTRAVENOUS | Status: AC
  Administered 2012-12-24: 2 g via INTRAVENOUS
  Filled 2012-12-23: qty 50

## 2012-12-24 ENCOUNTER — Inpatient Hospital Stay (HOSPITAL_COMMUNITY)
Admission: RE | Admit: 2012-12-24 | Discharge: 2012-12-27 | DRG: 581 | Disposition: A | Discharge status: Home / Self Care | Payer: Medicare Other | Source: Ambulatory Visit | Attending: General Surgery | Admitting: General Surgery

## 2012-12-24 ENCOUNTER — Encounter (HOSPITAL_COMMUNITY)
Admission: RE | Disposition: A | Discharge status: Home / Self Care | Payer: Self-pay | Source: Ambulatory Visit | Attending: General Surgery

## 2012-12-24 ENCOUNTER — Encounter (HOSPITAL_COMMUNITY)
Admission: RE | Admit: 2012-12-24 | Discharge: 2012-12-24 | Disposition: A | Discharge status: Home / Self Care | Payer: Medicare Other | Source: Ambulatory Visit | Attending: General Surgery | Admitting: General Surgery

## 2012-12-24 ENCOUNTER — Encounter (HOSPITAL_COMMUNITY): Payer: Self-pay | Admitting: *Deleted

## 2012-12-24 ENCOUNTER — Encounter (HOSPITAL_COMMUNITY): Payer: Self-pay | Admitting: Vascular Surgery

## 2012-12-24 ENCOUNTER — Ambulatory Visit (HOSPITAL_COMMUNITY): Payer: Medicare Other

## 2012-12-24 ENCOUNTER — Ambulatory Visit (HOSPITAL_COMMUNITY): Payer: Medicare Other | Admitting: Anesthesiology

## 2012-12-24 DIAGNOSIS — E785 Hyperlipidemia, unspecified: Secondary | ICD-10-CM | POA: Diagnosis present

## 2012-12-24 DIAGNOSIS — Z96659 Presence of unspecified artificial knee joint: Secondary | ICD-10-CM

## 2012-12-24 DIAGNOSIS — I2510 Atherosclerotic heart disease of native coronary artery without angina pectoris: Secondary | ICD-10-CM | POA: Diagnosis present

## 2012-12-24 DIAGNOSIS — Z96649 Presence of unspecified artificial hip joint: Secondary | ICD-10-CM

## 2012-12-24 DIAGNOSIS — M129 Arthropathy, unspecified: Secondary | ICD-10-CM | POA: Diagnosis present

## 2012-12-24 DIAGNOSIS — K219 Gastro-esophageal reflux disease without esophagitis: Secondary | ICD-10-CM | POA: Diagnosis present

## 2012-12-24 DIAGNOSIS — Z803 Family history of malignant neoplasm of breast: Secondary | ICD-10-CM

## 2012-12-24 DIAGNOSIS — I10 Essential (primary) hypertension: Secondary | ICD-10-CM | POA: Diagnosis present

## 2012-12-24 DIAGNOSIS — Z794 Long term (current) use of insulin: Secondary | ICD-10-CM | POA: Diagnosis not present

## 2012-12-24 DIAGNOSIS — E559 Vitamin D deficiency, unspecified: Secondary | ICD-10-CM | POA: Diagnosis not present

## 2012-12-24 DIAGNOSIS — F411 Generalized anxiety disorder: Secondary | ICD-10-CM | POA: Diagnosis present

## 2012-12-24 DIAGNOSIS — C50919 Malignant neoplasm of unspecified site of unspecified female breast: Secondary | ICD-10-CM

## 2012-12-24 DIAGNOSIS — E669 Obesity, unspecified: Secondary | ICD-10-CM | POA: Diagnosis present

## 2012-12-24 DIAGNOSIS — F329 Major depressive disorder, single episode, unspecified: Secondary | ICD-10-CM | POA: Diagnosis present

## 2012-12-24 DIAGNOSIS — F3289 Other specified depressive episodes: Secondary | ICD-10-CM | POA: Diagnosis present

## 2012-12-24 DIAGNOSIS — E119 Type 2 diabetes mellitus without complications: Secondary | ICD-10-CM | POA: Diagnosis present

## 2012-12-24 DIAGNOSIS — D0590 Unspecified type of carcinoma in situ of unspecified breast: Principal | ICD-10-CM | POA: Diagnosis present

## 2012-12-24 DIAGNOSIS — D0512 Intraductal carcinoma in situ of left breast: Secondary | ICD-10-CM

## 2012-12-24 HISTORY — PX: MASTECTOMY W/ SENTINEL NODE BIOPSY: SHX2001

## 2012-12-24 HISTORY — PX: TOTAL MASTECTOMY: SHX6129

## 2012-12-24 HISTORY — PX: MASTECTOMY: SHX3

## 2012-12-24 LAB — GLUCOSE, CAPILLARY: Glucose-Capillary: 158 mg/dL — ABNORMAL HIGH (ref 70–99)

## 2012-12-24 SURGERY — MASTECTOMY WITH SENTINEL LYMPH NODE BIOPSY
Anesthesia: General | Site: Breast | Laterality: Left | Wound class: Clean

## 2012-12-24 MED ORDER — MIDAZOLAM HCL 5 MG/5ML IJ SOLN
INTRAMUSCULAR | Status: DC | PRN
  Administered 2012-12-24: 2 mg via INTRAVENOUS

## 2012-12-24 MED ORDER — TECHNETIUM TC 99M SULFUR COLLOID FILTERED
1.0000 | Freq: Once | INTRAVENOUS | Status: AC | PRN
  Administered 2012-12-24: 1 via INTRADERMAL

## 2012-12-24 MED ORDER — MIDAZOLAM HCL 2 MG/2ML IJ SOLN: 0.5000 mg | Freq: Once | INTRAMUSCULAR | Status: DC | PRN

## 2012-12-24 MED ORDER — FENTANYL CITRATE 0.05 MG/ML IJ SOLN
INTRAMUSCULAR | Status: DC | PRN
  Administered 2012-12-24: 100 ug via INTRAVENOUS

## 2012-12-24 MED ORDER — PANTOPRAZOLE SODIUM 40 MG PO TBEC
40.0000 mg | DELAYED_RELEASE_TABLET | Freq: Every day | ORAL | Status: DC
  Administered 2012-12-25 – 2012-12-27 (×3): 40 mg via ORAL
  Filled 2012-12-24 (×3): qty 1

## 2012-12-24 MED ORDER — OXYCODONE HCL 5 MG PO TABS
5.0000 mg | ORAL_TABLET | Freq: Once | ORAL | Status: AC | PRN
  Administered 2012-12-24: 5 mg via ORAL

## 2012-12-24 MED ORDER — OXYCODONE HCL 5 MG/5ML PO SOLN: 5.0000 mg | Freq: Once | ORAL | Status: AC | PRN

## 2012-12-24 MED ORDER — PROMETHAZINE HCL 25 MG/ML IJ SOLN: 6.2500 mg | INTRAMUSCULAR | Status: DC | PRN

## 2012-12-24 MED ORDER — HYDROMORPHONE HCL PF 1 MG/ML IJ SOLN
0.2500 mg | INTRAMUSCULAR | Status: DC | PRN
  Administered 2012-12-24: 0.5 mg via INTRAVENOUS
  Administered 2012-12-24 (×2): 0.25 mg via INTRAVENOUS

## 2012-12-24 MED ORDER — ONDANSETRON HCL 4 MG/2ML IJ SOLN
INTRAMUSCULAR | Status: DC | PRN
  Administered 2012-12-24: 4 mg via INTRAVENOUS

## 2012-12-24 MED ORDER — MIDAZOLAM HCL 2 MG/2ML IJ SOLN
INTRAMUSCULAR | Status: AC
  Administered 2012-12-24: 1 mg
  Filled 2012-12-24: qty 2

## 2012-12-24 MED ORDER — OXYCODONE HCL 5 MG PO TABS
ORAL_TABLET | ORAL | Status: AC
  Administered 2012-12-24: 5 mg
  Filled 2012-12-24: qty 2

## 2012-12-24 MED ORDER — LACTATED RINGERS IV SOLN
INTRAVENOUS | Status: DC | PRN
  Administered 2012-12-24 (×2): via INTRAVENOUS

## 2012-12-24 MED ORDER — LISINOPRIL 10 MG PO TABS
10.0000 mg | ORAL_TABLET | Freq: Every day | ORAL | Status: DC
  Administered 2012-12-24 – 2012-12-27 (×4): 10 mg via ORAL
  Filled 2012-12-24 (×4): qty 1

## 2012-12-24 MED ORDER — FENTANYL CITRATE 0.05 MG/ML IJ SOLN
INTRAMUSCULAR | Status: AC
  Filled 2012-12-24: qty 2

## 2012-12-24 MED ORDER — HYDROMORPHONE HCL PF 1 MG/ML IJ SOLN
INTRAMUSCULAR | Status: AC
  Filled 2012-12-24: qty 1

## 2012-12-24 MED ORDER — PROPOFOL 10 MG/ML IV BOLUS
INTRAVENOUS | Status: DC | PRN
  Administered 2012-12-24: 150 mg via INTRAVENOUS

## 2012-12-24 MED ORDER — METHYLENE BLUE 1 % INJ SOLN
INTRAMUSCULAR | Status: AC
  Filled 2012-12-24: qty 10

## 2012-12-24 MED ORDER — INSULIN ASPART 100 UNIT/ML ~~LOC~~ SOLN
0.0000 [IU] | Freq: Three times a day (TID) | SUBCUTANEOUS | Status: DC
  Administered 2012-12-24: 3 [IU] via SUBCUTANEOUS
  Administered 2012-12-25 (×3): 2 [IU] via SUBCUTANEOUS
  Administered 2012-12-26: 3 [IU] via SUBCUTANEOUS
  Administered 2012-12-26: 2 [IU] via SUBCUTANEOUS
  Administered 2012-12-26 – 2012-12-27 (×2): 3 [IU] via SUBCUTANEOUS

## 2012-12-24 MED ORDER — BUPIVACAINE HCL (PF) 0.25 % IJ SOLN
INTRAMUSCULAR | Status: AC
  Filled 2012-12-24: qty 30

## 2012-12-24 MED ORDER — SODIUM CHLORIDE 0.9 % IV SOLN
INTRAVENOUS | Status: DC
  Administered 2012-12-25: 09:00:00 via INTRAVENOUS

## 2012-12-24 MED ORDER — ATORVASTATIN CALCIUM 40 MG PO TABS
40.0000 mg | ORAL_TABLET | Freq: Every day | ORAL | Status: DC
  Administered 2012-12-24 – 2012-12-27 (×4): 40 mg via ORAL
  Filled 2012-12-24 (×4): qty 1

## 2012-12-24 MED ORDER — EZETIMIBE 10 MG PO TABS
10.0000 mg | ORAL_TABLET | Freq: Every day | ORAL | Status: DC
  Administered 2012-12-24 – 2012-12-27 (×4): 10 mg via ORAL
  Filled 2012-12-24 (×4): qty 1

## 2012-12-24 MED ORDER — LIDOCAINE HCL (CARDIAC) 20 MG/ML IV SOLN
INTRAVENOUS | Status: DC | PRN
  Administered 2012-12-24: 100 mg via INTRAVENOUS

## 2012-12-24 MED ORDER — DOCUSATE SODIUM 100 MG PO CAPS
100.0000 mg | ORAL_CAPSULE | Freq: Two times a day (BID) | ORAL | Status: DC
  Administered 2012-12-24 – 2012-12-27 (×6): 100 mg via ORAL
  Filled 2012-12-24 (×6): qty 1

## 2012-12-24 MED ORDER — FLUOXETINE HCL 20 MG PO CAPS
40.0000 mg | ORAL_CAPSULE | Freq: Every day | ORAL | Status: DC
  Administered 2012-12-24 – 2012-12-27 (×4): 40 mg via ORAL
  Filled 2012-12-24 (×4): qty 2

## 2012-12-24 MED ORDER — PHENYLEPHRINE HCL 10 MG/ML IJ SOLN
INTRAMUSCULAR | Status: DC | PRN
  Administered 2012-12-24 (×2): 80 ug via INTRAVENOUS

## 2012-12-24 MED ORDER — MIDAZOLAM HCL 2 MG/2ML IJ SOLN: 1.0000 mg | INTRAMUSCULAR | Status: DC | PRN

## 2012-12-24 MED ORDER — MEPERIDINE HCL 25 MG/ML IJ SOLN: 6.2500 mg | INTRAMUSCULAR | Status: DC | PRN

## 2012-12-24 MED ORDER — OXYCODONE HCL 5 MG PO TABS
5.0000 mg | ORAL_TABLET | ORAL | Status: DC | PRN
  Administered 2012-12-24 – 2012-12-25 (×3): 5 mg via ORAL
  Filled 2012-12-24 (×3): qty 1

## 2012-12-24 MED ORDER — FLUOXETINE HCL 40 MG PO CAPS: 40.0000 mg | ORAL_CAPSULE | Freq: Every day | ORAL | Status: DC

## 2012-12-24 MED ORDER — 0.9 % SODIUM CHLORIDE (POUR BTL) OPTIME
TOPICAL | Status: DC | PRN
  Administered 2012-12-24: 1000 mL

## 2012-12-24 MED ORDER — OXYBUTYNIN CHLORIDE 5 MG PO TABS
5.0000 mg | ORAL_TABLET | Freq: Two times a day (BID) | ORAL | Status: DC
  Administered 2012-12-24 – 2012-12-27 (×6): 5 mg via ORAL
  Filled 2012-12-24 (×7): qty 1

## 2012-12-24 MED ORDER — ACETAMINOPHEN 325 MG PO TABS: 650.0000 mg | ORAL_TABLET | Freq: Four times a day (QID) | ORAL | Status: DC | PRN

## 2012-12-24 MED ORDER — CEFAZOLIN SODIUM 1-5 GM-% IV SOLN
1.0000 g | Freq: Three times a day (TID) | INTRAVENOUS | Status: AC
  Administered 2012-12-24 – 2012-12-25 (×2): 1 g via INTRAVENOUS
  Filled 2012-12-24 (×2): qty 50

## 2012-12-24 MED ORDER — FENTANYL CITRATE 0.05 MG/ML IJ SOLN
50.0000 ug | INTRAMUSCULAR | Status: DC | PRN
  Administered 2012-12-24: 50 ug via INTRAVENOUS

## 2012-12-24 MED ORDER — MORPHINE SULFATE 2 MG/ML IJ SOLN
2.0000 mg | INTRAMUSCULAR | Status: DC | PRN
  Administered 2012-12-24 – 2012-12-27 (×10): 2 mg via INTRAVENOUS
  Filled 2012-12-24 (×10): qty 1

## 2012-12-24 MED ORDER — ONDANSETRON HCL 4 MG/2ML IJ SOLN
4.0000 mg | Freq: Four times a day (QID) | INTRAMUSCULAR | Status: DC | PRN
  Administered 2012-12-25 – 2012-12-26 (×2): 4 mg via INTRAVENOUS
  Filled 2012-12-24 (×2): qty 2

## 2012-12-24 MED ORDER — ACETAMINOPHEN 650 MG RE SUPP: 650.0000 mg | Freq: Four times a day (QID) | RECTAL | Status: DC | PRN

## 2012-12-24 SURGICAL SUPPLY — 55 items
ADH SKN CLS LQ APL DERMABOND (GAUZE/BANDAGES/DRESSINGS) ×1
APL SKNCLS STERI-STRIP NONHPOA (GAUZE/BANDAGES/DRESSINGS) ×1
APPLIER CLIP 9.375 MED OPEN (MISCELLANEOUS) ×2
APR CLP MED 9.3 20 MLT OPN (MISCELLANEOUS) ×1
BENZOIN TINCTURE PRP APPL 2/3 (GAUZE/BANDAGES/DRESSINGS) ×2 IMPLANT
BINDER BREAST LRG (GAUZE/BANDAGES/DRESSINGS) IMPLANT
BINDER BREAST XLRG (GAUZE/BANDAGES/DRESSINGS) ×1 IMPLANT
BIOPATCH WHT 1IN DISK W/4.0 H (GAUZE/BANDAGES/DRESSINGS) ×1 IMPLANT
CANISTER SUCTION 2500CC (MISCELLANEOUS) ×2 IMPLANT
CHLORAPREP W/TINT 26ML (MISCELLANEOUS) ×2 IMPLANT
CLIP APPLIE 9.375 MED OPEN (MISCELLANEOUS) IMPLANT
CLOTH BEACON ORANGE TIMEOUT ST (SAFETY) ×2 IMPLANT
CONT SPEC 4OZ CLIKSEAL STRL BL (MISCELLANEOUS) ×2 IMPLANT
COVER PROBE W GEL 5X96 (DRAPES) ×2 IMPLANT
COVER SURGICAL LIGHT HANDLE (MISCELLANEOUS) ×2 IMPLANT
DERMABOND ADHESIVE PROPEN (GAUZE/BANDAGES/DRESSINGS) ×1
DERMABOND ADVANCED .7 DNX6 (GAUZE/BANDAGES/DRESSINGS) IMPLANT
DRAIN CHANNEL 19F RND (DRAIN) ×3 IMPLANT
DRAPE LAPAROSCOPIC ABDOMINAL (DRAPES) ×2 IMPLANT
DRSG PAD ABDOMINAL 8X10 ST (GAUZE/BANDAGES/DRESSINGS) ×1 IMPLANT
ELECT BLADE 4.0 EZ CLEAN MEGAD (MISCELLANEOUS) ×2
ELECT CAUTERY BLADE 6.4 (BLADE) ×2 IMPLANT
ELECT REM PT RETURN 9FT ADLT (ELECTROSURGICAL) ×2
ELECTRODE BLDE 4.0 EZ CLN MEGD (MISCELLANEOUS) ×1 IMPLANT
ELECTRODE REM PT RTRN 9FT ADLT (ELECTROSURGICAL) ×1 IMPLANT
EVACUATOR SILICONE 100CC (DRAIN) ×3 IMPLANT
GLOVE BIO SURGEON STRL SZ7 (GLOVE) ×2 IMPLANT
GLOVE BIOGEL PI IND STRL 7.5 (GLOVE) ×1 IMPLANT
GLOVE BIOGEL PI INDICATOR 7.5 (GLOVE) ×1
GOWN STRL NON-REIN LRG LVL3 (GOWN DISPOSABLE) ×6 IMPLANT
KIT BASIN OR (CUSTOM PROCEDURE TRAY) ×2 IMPLANT
KIT ROOM TURNOVER OR (KITS) ×2 IMPLANT
MARKER SKIN DUAL TIP RULER LAB (MISCELLANEOUS) ×2 IMPLANT
NDL 18GX1X1/2 (RX/OR ONLY) (NEEDLE) IMPLANT
NDL HYPO 25GX1X1/2 BEV (NEEDLE) IMPLANT
NEEDLE 18GX1X1/2 (RX/OR ONLY) (NEEDLE) IMPLANT
NEEDLE HYPO 25GX1X1/2 BEV (NEEDLE) IMPLANT
NS IRRIG 1000ML POUR BTL (IV SOLUTION) ×2 IMPLANT
PACK GENERAL/GYN (CUSTOM PROCEDURE TRAY) ×2 IMPLANT
PAD ARMBOARD 7.5X6 YLW CONV (MISCELLANEOUS) ×2 IMPLANT
PIN SAFETY STERILE (MISCELLANEOUS) ×2 IMPLANT
SPECIMEN JAR X LARGE (MISCELLANEOUS) ×2 IMPLANT
SPONGE GAUZE 4X4 12PLY (GAUZE/BANDAGES/DRESSINGS) ×2 IMPLANT
STAPLER VISISTAT 35W (STAPLE) ×2 IMPLANT
STRIP CLOSURE SKIN 1/2X4 (GAUZE/BANDAGES/DRESSINGS) ×4 IMPLANT
SUT ETHILON 2 0 FS 18 (SUTURE) ×2 IMPLANT
SUT MON AB 4-0 PC3 18 (SUTURE) ×3 IMPLANT
SUT SILK 2 0 SH (SUTURE) IMPLANT
SUT VIC AB 3-0 54X BRD REEL (SUTURE) ×1 IMPLANT
SUT VIC AB 3-0 BRD 54 (SUTURE) ×2
SUT VIC AB 3-0 SH 8-18 (SUTURE) ×3 IMPLANT
SYR CONTROL 10ML LL (SYRINGE) IMPLANT
TOWEL OR 17X24 6PK STRL BLUE (TOWEL DISPOSABLE) ×2 IMPLANT
TOWEL OR 17X26 10 PK STRL BLUE (TOWEL DISPOSABLE) ×2 IMPLANT
WATER STERILE IRR 1000ML POUR (IV SOLUTION) IMPLANT

## 2012-12-24 NOTE — Preoperative (Signed)
Beta Blockers   Reason not to administer Beta Blockers:Not Applicable 

## 2012-12-24 NOTE — Anesthesia Postprocedure Evaluation (Signed)
  Anesthesia Post-op Note  Patient: Kristen [name]  Procedure(s) Performed: Procedure(s): LEFT MASTECTOMY WITH LEFT SENTINEL LYMPH NODE BIOPSY (Left)  Patient Location: PACU  Anesthesia Type:General  Level of Consciousness: awake, alert , oriented and patient cooperative  Airway and Oxygen Therapy: Patient Spontanous Breathing and Patient connected to nasal cannula oxygen  Post-op Pain: mild  Post-op Assessment: Post-op Vital signs reviewed, Patient's Cardiovascular Status Stable, Respiratory Function Stable, Patent Airway, No signs of Nausea or vomiting and Pain level controlled  Post-op Vital Signs: Reviewed and stable  Complications: No apparent anesthesia complications

## 2012-12-24 NOTE — Anesthesia Procedure Notes (Signed)
Procedure Name: LMA Insertion Date/Time: 12/24/2012 12:31 PM Performed by: Rogelia Boga Pre-anesthesia Checklist: Patient identified, Emergency Drugs available, Suction available, Patient being monitored and Timeout performed Patient Re-evaluated:Patient Re-evaluated prior to inductionOxygen Delivery Method: Circle system utilized Preoxygenation: Pre-oxygenation with 100% oxygen Intubation Type: IV induction LMA Size: 4.0 Number of attempts: 1 Placement Confirmation: positive ETCO2 and breath sounds checked- equal and bilateral Tube secured with: Tape Dental Injury: Teeth and Oropharynx as per pre-operative assessment

## 2012-12-24 NOTE — H&P (View-Only) (Signed)
Patient ID: Kristen [name], female   DOB: 09/23/1945, 67 y.o.   MRN: [id number]  Chief Complaint  Patient presents with  . New Evaluation    eval l br ca    HPI Kristen [name] is a 67 y.o. female.  referred by Dr Tilda Burrow                                                                     HPI 56 yof who had spontaneous bloody left nipple discharge beginning about a month ago.  No other complaints.  She has fh of mother who is deceased from breast cancer at age 4.She presented and underwent evaluation with mm that shows a small well-circumscribed mass in the left breast.  There is some nonmass asymmetric density in subareolar and lateral subareolar region.  [place] in 9 o'clock position at areolar margin shows a 4-6 mm mass.  She then underwent attempt at ductogram that was unsuccessful.  Breast MR was performed showing a 5.3x2.7x1.7 cm area of clumped linear enhancement from nipple to midportion of the breast.  She then underwent mr biopsy of part of this lesion showing dcis 100/100.  She comes in today to discuss her options.   Past Medical History  Diagnosis Date  . Diabetes mellitus   . Heart disease   . Hyperlipidemia   . Allergy   . Anxiety   . Depression   . Arthritis   . Acute angina   . Hypertension   . Hemorrhoids   . CAD (coronary artery disease)   . Obesity   . Overactive bladder   . Vitamin D deficiency   . GERD (gastroesophageal reflux disease)     Past Surgical History  Procedure Laterality Date  . Oophorectomy    . Replacement total knee      both knees  . Back surgery    . Partial hip arthroplasty      left hip  . Cholecystectomy, laparoscopic    . Lithotripsy    . Cesarean section    . Joint replacement    . Cholecystectomy      Family History  Problem Relation Age of Onset  . Cancer Mother     mouth cancer  . Lung cancer Brother   . Lung cancer Brother   . Lung cancer Brother     Social History History  Substance Use Topics  . Smoking status: Never  Smoker   . Smokeless tobacco: Never Used  . Alcohol Use: No    Allergies  Allergen Reactions  . Sulfonamide Derivatives Hives    Current Outpatient Prescriptions  Medication Sig Dispense Refill  . aspirin 81 MG tablet Take 81 mg by mouth daily.        Marland Kitchen atorvastatin (LIPITOR) 40 MG tablet Take 1 tablet (40 mg total) by mouth daily.  30 tablet  5  . Cholecalciferol (VITAMIN D) 2000 UNITS tablet Take 2,000 Units by mouth daily.        Tery Sanfilippo Calcium (STOOL SOFTENER PO) Take 1 capsule by mouth 2 (two) times daily.        Marland Kitchen ezetimibe (ZETIA) 10 MG tablet Take 1 tablet (10 mg total) by mouth daily.  30 tablet  5  . FLUoxetine (PROZAC) 40 MG  capsule Take 1 capsule (40 mg total) by mouth daily.  30 capsule  5  . insulin glargine (LANTUS SOLOSTAR) 100 UNIT/ML injection Inject 0.32 mLs (32 Units total) into the skin at bedtime.  10 mL  5  . lisinopril (PRINIVIL,ZESTRIL) 10 MG tablet Take 1 tablet (10 mg total) by mouth daily.  30 tablet  5  . metFORMIN (GLUMETZA) 1000 MG (MOD) 24 hr tablet Take 1 tablet (1,000 mg total) by mouth 2 (two) times daily.  30 tablet  5  . omeprazole (PRILOSEC) 20 MG capsule Take 1 capsule (20 mg total) by mouth 2 (two) times daily.  30 capsule  5  . oxybutynin (DITROPAN) 5 MG tablet Take 1 tablet (5 mg total) by mouth 2 (two) times daily.  60 tablet  5  . [DISCONTINUED] oxybutynin (DITROPAN) 5 MG tablet Take 5 mg by mouth 2 (two) times daily.         No current facility-administered medications for this visit.    Review of Systems Review of Systems  Constitutional: Negative for fever, chills and unexpected weight change.  HENT: Negative for hearing loss, congestion, sore throat, trouble swallowing and voice change.   Eyes: Negative for visual disturbance.  Respiratory: Negative for cough and wheezing.   Cardiovascular: Negative for chest pain, palpitations and leg swelling.  Gastrointestinal: Negative for nausea, vomiting, abdominal pain, diarrhea,  constipation, blood in stool, abdominal distention and anal bleeding.  Genitourinary: Negative for hematuria, vaginal bleeding and difficulty urinating.  Musculoskeletal: Negative for arthralgias.  Skin: Negative for rash and wound.  Neurological: Negative for seizures, syncope and headaches.  Hematological: Negative for adenopathy. Does not bruise/bleed easily.  Psychiatric/Behavioral: Negative for confusion.    Blood pressure 112/72, pulse 65, temperature 98 F (36.7 C), temperature source Temporal, resp. rate 16, height 5\' 4"  (1.626 m), weight 205 lb 9.6 oz (93.26 kg).  Physical Exam Physical Exam  Constitutional: She appears well-developed and well-nourished.  Eyes: No scleral icterus.  Neck: Neck supple.  Cardiovascular: Normal rate, regular rhythm and normal heart sounds.   Pulmonary/Chest: Effort normal and breath sounds normal. She has no wheezes. She has no rales. Right breast exhibits no inverted nipple, no mass, no nipple discharge, no skin change and no tenderness. Left breast exhibits mass. Left breast exhibits no inverted nipple, no nipple discharge, no skin change and no tenderness.    Lymphadenopathy:    She has no cervical adenopathy.    She has no axillary adenopathy.       Right: No supraclavicular adenopathy present.       Left: No supraclavicular adenopathy present.    Data Reviewed BILATERAL BREAST MRI WITH AND WITHOUT CONTRAST  Technique: Multiplanar, multisequence MR images of both breasts  were obtained prior to and following the intravenous administration  of 19ml of MultiHance. Three dimensional images were evaluated at  the independent DynaCad workstation.  Comparison: Current and previous examinations at [facility] and [facility], including  mammograms and ultrasound.  Findings: Minimal background parenchymal enhancement in both  breasts.  Clumped linear enhancement in the anterior aspect of the upper  outer  quadrant of the left breast, extending from the nipple to the  midportion of the breast. This area measures 5.3 x 2.7 x 1.7 cm in  maximum dimensions. This has low grade enhancement with persistent  and plateau kinetics.  No additional areas of enhancement suspicious for malignancy in  either breast. No abnormal appearing lymph nodes.  IMPRESSION:  5.3 x 2.7 x 1.7 cm area of clumped linear enhancement in a  segmental distribution in the upper outer quadrant of the left  breast extending from the nipple to the midportion of the breast.  This is highly suspicious for ductal carcinoma in situ. Therefore,  MR guided core needle biopsy is recommended.  RECOMMENDATION:  Left breast MR guided core needle biopsy.    Assessment    Left breast DCIS     Plan    MR biopsy of extent of possible disease prior to deciding on surgical therapy Genetics referral We discussed the staging and pathophysiology of breast cancer. We discussed all of the different options for treatment for breast cancer including surgery, chemotherapy, radiation therapy, Herceptin, and antiestrogen therapy.  She has stage 0 breast cancer currently.  We discussed a sentinel lymph node biopsy as she does not appear to having lymph node involvement right now. She has DCIS but if this is over 5 cm as appears on next biopsy I would recommend snbx with whatever breast surgery we do. We discussed the performance of that with injection of radioactive tracer and possibly blue dye. We discussed that she would have an incision underneath her axillary hairline if she has lumpectomy. We will await the permanent pathology to make any other first further decisions in terms of her treatment. One of these options might be to return to the operating room to perform an axillary lymph node dissection. We discussed about a 1-2% risk lifetime of chronic shoulder pain as well as lymphedema associated with a sentinel lymph node biopsy.  We discussed the  options for treatment of the breast cancer which included lumpectomy versus a mastectomy. We discussed the performance of the lumpectomy with a wire placement. This would need to be bracketed.  I think it is still possible to do this but if entire extent involved may consider mastectomy.We discussed a 10-20% chance of a positive margin requiring reexcision in the operating room. We also discussed that she may need radiation therapy or antiestrogen therapy or both if she undergoes lumpectomy. We discussed the mastectomy and the postoperative care for that as well. We discussed that there is no difference in her survival whether she undergoes lumpectomy with radiation therapy or antiestrogen therapy versus a mastectomy. There is a slight difference in the local recurrence rate being 3-5% with lumpectomy and about 1% with a mastectomy. We discussed the risks of operation including bleeding, infection, possible reoperation. She understands her further therapy will be based on what her stages at the time of her operation.          Novaleigh Kohlman 11/28/2012, 11:20 AM

## 2012-12-24 NOTE — Op Note (Signed)
Preoperative diagnosis: Left breast DCIS Postoperative diagnosis: same as above Procedure: Left total mastectomy, left axillary sentinel node biopsy Surgeon: Dr Harden Mo Anesthesia: General EBL: 100 cc Drains: 2 19 Fr blake drains  Specimen: left mastectomy marked short stitch superior , long stitch lateral Complications: none Sponge and needle count correct times two Disposition to recovery stable  Indications: This is a 56 yof who presents with left breast screening abnormality.  She has undergone biopsy of a 5 cm extent of disease that is DCIS.  We discussed all her options including reconstruction and she has elected to undergo a mastectomy, sentinel node biopsy. The risks and benefits were discussed.  Procedure: After informed consent was obtained, the patient was first injected with technetium in the standard periareolar fashion.  She was given ancef and SCDs were placed preoperatively.  She was then placed under general anesthesia without complication.  Her left breast and axilla were then prepped and draped in the standard sterile surgical fashion.  A surgical timeout was performed.  I then made an elliptical incision surrounding the nipple areolar complex. I removed a fair amount of skin due to the fact she was not having a reconstruction. She does havea lot of excess tissue surrounding her left breast. I then created flaps superiorly to the clavicle, medially to the sternum, inferiorly to the inframammary crease and laterally to the latissimus. The breast was then removed and the pectoralis muscle including the pectoralis fascia. This was then passed off the table as a specimen marked as above. I then used the neoprobe to identify a packet of sentinel lymph nodes. This counts at their highest [id number]. There was no background radioactivity upon completion. These nodes were somewhat enlarged but did not pursue these anymore as her initial diagnosis was ductal carcinoma in situ. I then  obtained hemostasis. Irrigation was performed. I inserted 2 19 [place] Blake drains and secured these with 2-0 nylon suture. I then proceeded to remove a little bit more of her medial incision to prevent a dog ear from forming there. I then closed the incision with 3-0 Vicryl. The lateral portion had a lot of excess tissue so I ended up removing a fair amount more and closed this in a Y fashion. I then closed the skin with 4-0 Monocryl. Dermabond and Steri-Strips were placed. The drains were functional. I placed ABD pads and then a breast binder. She tolerated this well we transferred recovery in stable condition.

## 2012-12-24 NOTE — Interval H&P Note (Signed)
History and Physical Interval Note:  12/24/2012 12:01 PM  Kristen [name]  has presented today for surgery, with the diagnosis of left breast dcis  The various methods of treatment have been discussed with the patient and family. After consideration of risks, benefits and other options for treatment, the patient has consented to  Procedure(s): LEFT MASTECTOMY WITH LEFT SENTINEL LYMPH NODE BIOPSY (Left) as a surgical intervention .  The patient's history has been reviewed, patient examined, no change in status, stable for surgery.  I have reviewed the patient's chart and labs.  Questions were answered to the patient's satisfaction.     [name]

## 2012-12-24 NOTE — Anesthesia Preprocedure Evaluation (Addendum)
Anesthesia Evaluation  Patient identified by MRN, date of birth, ID band Patient awake    Reviewed: Allergy & Precautions, H&P , NPO status , Patient's Chart, lab work & pertinent test results  History of Anesthesia Complications Negative for: history of anesthetic complications  Airway Mallampati: I TM Distance: >3 FB Neck ROM: Full    Dental  (+) Edentulous Upper and Edentulous Lower   Pulmonary shortness of breath,  breath sounds clear to auscultation  Pulmonary exam normal       Cardiovascular hypertension, Pt. on medications + CAD ('10 cath: mild non-obstructive ASCADz) - dysrhythmias Rhythm:Regular Rate:Normal  12/21/12 Stress test: small area of reversability, EF 74% Dr. Johney Frame: low risk for 12/24/12 mastectomy   Neuro/Psych PSYCHIATRIC DISORDERS Anxiety Depression negative neurological ROS     GI/Hepatic Neg liver ROS, GERD-  Medicated and Controlled,  Endo/Other  diabetes (glu 157), Well Controlled, Type 2, Insulin DependentMorbid obesity  Renal/GU negative Renal ROS     Musculoskeletal   Abdominal (+) + obese,   Peds  Hematology   Anesthesia Other Findings   Reproductive/Obstetrics                         Anesthesia Physical Anesthesia Plan  ASA: III  Anesthesia Plan: General   Post-op Pain Management:    Induction: Intravenous  Airway Management Planned: LMA  Additional Equipment:   Intra-op Plan:   Post-operative Plan:   Informed Consent: I have reviewed the patients History and Physical, chart, labs and discussed the procedure including the risks, benefits and alternatives for the proposed anesthesia with the patient or authorized representative who has indicated his/her understanding and acceptance.     Plan Discussed with: CRNA, Surgeon and Anesthesiologist  Anesthesia Plan Comments: (Plan routine monitors, GA- LMA OK)       Anesthesia Quick Evaluation

## 2012-12-24 NOTE — Transfer of Care (Signed)
Immediate Anesthesia Transfer of Care Note  Patient: Kristen [name]  Procedure(s) Performed: Procedure(s): LEFT MASTECTOMY WITH LEFT SENTINEL LYMPH NODE BIOPSY (Left)  Patient Location: PACU  Anesthesia Type:General  Level of Consciousness: awake, alert , oriented and patient cooperative  Airway & Oxygen Therapy: Patient Spontanous Breathing and Patient connected to nasal cannula oxygen  Post-op Assessment: Report given to PACU RN, Post -op Vital signs reviewed and stable and Patient moving all extremities X 4  Post vital signs: Reviewed and stable  Complications: No apparent anesthesia complications

## 2012-12-25 ENCOUNTER — Encounter (HOSPITAL_COMMUNITY): Payer: Self-pay | Admitting: General Practice

## 2012-12-25 LAB — BASIC METABOLIC PANEL
BUN: 15 mg/dL (ref 6–23)
Calcium: 8.7 mg/dL (ref 8.4–10.5)
Creatinine, Ser: 0.97 mg/dL (ref 0.50–1.10)
GFR calc non Af Amer: 59 mL/min — ABNORMAL LOW (ref >90)
Glucose, Bld: 162 mg/dL — ABNORMAL HIGH (ref 70–99)

## 2012-12-25 LAB — CBC
HCT: 33.2 % — ABNORMAL LOW (ref 36.0–46.0)
Hemoglobin: 10.5 g/dL — ABNORMAL LOW (ref 12.0–15.0)
MCH: 29.4 pg (ref 26.0–34.0)
MCHC: 31.6 g/dL (ref 30.0–36.0)
MCV: 93 fL (ref 78.0–100.0)

## 2012-12-25 LAB — GLUCOSE, CAPILLARY: Glucose-Capillary: 164 mg/dL — ABNORMAL HIGH (ref 70–99)

## 2012-12-25 MED ORDER — OXYCODONE HCL 5 MG PO TABS
5.0000 mg | ORAL_TABLET | ORAL | Status: DC | PRN
  Administered 2012-12-25 – 2012-12-27 (×4): 10 mg via ORAL
  Filled 2012-12-25 (×4): qty 2

## 2012-12-25 MED ORDER — PNEUMOCOCCAL VAC POLYVALENT 25 MCG/0.5ML IJ INJ
0.5000 mL | INJECTION | INTRAMUSCULAR | Status: AC
  Administered 2012-12-26: 0.5 mL via INTRAMUSCULAR
  Filled 2012-12-25: qty 0.5

## 2012-12-25 NOTE — Progress Notes (Signed)
1 Day Post-Op  Subjective: Sore, tired  Objective: Vital signs in last 24 hours: Temp:  [97.6 F (36.4 C)-98.5 F (36.9 C)] 98.5 F (36.9 C) (07/15 0615) Pulse Rate:  [63-88] 74 (07/15 0615) Resp:  [10-20] 18 (07/15 0615) BP: (105-163)/(59-86) 118/63 mmHg (07/15 0615) SpO2:  [91 %-100 %] 93 % (07/15 0615) Last BM Date: 12/24/12  Intake/Output from previous day: 07/14 0701 - 07/15 0700 In: 1990 [P.O.:240; I.V.:1750] Out: 1290 [Urine:1200; Drains:90] Intake/Output this shift:    General no distress Lungs clear Ab soft Left mastectomy incision clean without infection, flaps viable and soft, drains with expected output  Lab Results:   Recent Labs  12/25/12 0615  WBC 7.1  HGB 10.5*  HCT 33.2*  PLT 200   BMET  Recent Labs  12/25/12 0615  NA 137  K 4.2  CL 99  CO2 30  GLUCOSE 162*  BUN 15  CREATININE 0.97  CALCIUM 8.7    Assessment/Plan: POD 1 left mastectomy/snbx 1. Po pain meds with iv backup, will stay until tomorrow for pain control 2. pulm toilet 3. Continue tele, restart home meds 4. Regular diet 5. jp teaching 6. scds  [facility] 12/25/2012

## 2012-12-26 LAB — GLUCOSE, CAPILLARY
Glucose-Capillary: 198 mg/dL — ABNORMAL HIGH (ref 70–99)
Glucose-Capillary: 199 mg/dL — ABNORMAL HIGH (ref 70–99)

## 2012-12-26 MED ORDER — BISACODYL 10 MG RE SUPP
10.0000 mg | Freq: Once | RECTAL | Status: AC
  Administered 2012-12-26: 10 mg via RECTAL
  Filled 2012-12-26: qty 1

## 2012-12-26 MED ORDER — POLYETHYLENE GLYCOL 3350 17 G PO PACK
17.0000 g | PACK | Freq: Every day | ORAL | Status: DC
  Administered 2012-12-26 – 2012-12-27 (×2): 17 g via ORAL
  Filled 2012-12-26 (×2): qty 1

## 2012-12-26 NOTE — Evaluation (Signed)
Physical Therapy Evaluation Patient Details Name: Kristen [name] MRN: [id number] DOB: December 20, 1945 Today's Date: 12/26/2012 Time: 4098-1191 PT Time Calculation (min): 23 min  PT Assessment / Plan / Recommendation History of Present Illness  67 yof who had spontaneous bloody left nipple discharge beginning about a month ago.  No other complaints.  She has fh of mother who is deceased from breast cancer at age 68.She presented and underwent evaluation with mm that shows a small well-circumscribed mass in the left breast.  There is some nonmass asymmetric density in subareolar and lateral subareolar region.  [place] in 9 o'clock position at areolar margin shows a 4-6 mm mass.  She then underwent attempt at ductogram that was unsuccessful.  Breast MR was performed showing a 5.3x2.7x1.7 cm area of clumped linear enhancement from nipple to midportion of the breast.  She then underwent mr biopsy of part of this lesion showing dcis 100/100.  pt s/L total mastectomy with sentinal node removal  Clinical Impression  Pt s/p total mastectomy and sentinal node resection.  Pt instructed in general guidelines for L UE mobility for pre drain and post drain pull.  Pt is a supervision level.  Expect her to make steady gains and be at Independent level before her daughter has    PT Assessment  Patent does not need any further PT services    Follow Up Recommendations  No PT follow up    Does the patient have the potential to tolerate intense rehabilitation      Barriers to Discharge        Equipment Recommendations  None recommended by PT    Recommendations for Other Services     Frequency      Precautions / Restrictions Restrictions Weight Bearing Restrictions: No   Pertinent Vitals/Pain       Mobility  Bed Mobility Bed Mobility: Not assessed Transfers Transfers: Sit to Stand;Stand to Sit Sit to Stand: 5: Supervision Stand to Sit: 5: Supervision Details for Transfer Assistance: safe mobility, did not  have to use L Ue Ambulation/Gait Ambulation/Gait Assistance: 5: Supervision Ambulation Distance (Feet): 400 Feet Assistive device: None Ambulation/Gait Assistance Details: steady gait, though initially guarded Gait Pattern: Within Functional Limits Gait velocity: slower Stairs: Yes Stair Management Technique: One rail Right;Forwards;Alternating pattern Number of Stairs: 3 Wheelchair Mobility Wheelchair Mobility: No    Exercises     PT Diagnosis:    PT Problem List:   PT Treatment Interventions:       PT Goals(Current goals can be found in the care plan section)    Visit Information  Last PT Received On: 12/26/12 Assistance Needed: +1 History of Present Illness: 67 yof who had spontaneous bloody left nipple discharge beginning about a month ago.  No other complaints.  She has fh of mother who is deceased from breast cancer at age 68.She presented and underwent evaluation with mm that shows a small well-circumscribed mass in the left breast.  There is some nonmass asymmetric density in subareolar and lateral subareolar region.  [place] in 9 o'clock position at areolar margin shows a 4-6 mm mass.  She then underwent attempt at ductogram that was unsuccessful.  Breast MR was performed showing a 5.3x2.7x1.7 cm area of clumped linear enhancement from nipple to midportion of the breast.  She then underwent mr biopsy of part of this lesion showing dcis 100/100.  pt s/L total mastectomy with sentinal node removal       Prior Functioning  Home Living Family/patient expects to be discharged to::  Private residence Living Arrangements: Children Available Help at Discharge: Family;Other (Comment) (for 5 days then pt will be alone days) Type of Home: House Home Access: Stairs to enter [employer] of Steps: 4 Entrance Stairs-Rails: Right;Left Home Layout: One level Home Equipment: None Prior Function Level of Independence: Independent Communication Communication: No difficulties     Cognition  Cognition Arousal/Alertness: Awake/alert Behavior During Therapy: WFL for tasks assessed/performed Overall Cognitive Status: Within Functional Limits for tasks assessed    Extremity/Trunk Assessment     Balance Balance Balance Assessed: No  End of Session PT - End of Session Activity Tolerance: Patient tolerated treatment well Patient left: in chair;with call bell/phone within reach Nurse Communication: Mobility status  GP     Jacquelinne Speak, Eliseo Gum 12/26/2012, 2:30 PM 12/26/2012   Bing, PT [phone]  (pager)

## 2012-12-26 NOTE — Progress Notes (Signed)
2 Days Post-Op  Subjective: Some nausea yesterday, episode of emesis today, no other complaints. No events seen, ambulated, voiding, no bm yet  Objective: Vital signs in last 24 hours: Temp:  [97.8 F (36.6 C)-98.7 F (37.1 C)] 98.5 F (36.9 C) (07/16 0529) Pulse Rate:  [66-77] 66 (07/16 0529) Resp:  [18-20] 19 (07/16 0529) BP: (121-149)/(61-70) 149/70 mmHg (07/16 0529) SpO2:  [97 %-100 %] 100 % (07/16 0529) Weight:  [204 lb (92.534 kg)] 204 lb (92.534 kg) (07/16 0700) Last BM Date: 12/24/12  Intake/Output from previous day: 07/15 0701 - 07/16 0700 In: 917.3 [P.O.:480; I.V.:437.3] Out: 2545 [Urine:2400; Drains:145] Intake/Output this shift:    General appearance: no distress Resp: clear to auscultation bilaterally Cardio: regular rate and rhythm GI: soft nontender bs present Incision/Wound:left mastectomy incision clean flaps viable, drains with expected output  Lab Results:   Recent Labs  12/25/12 0615  WBC 7.1  HGB 10.5*  HCT 33.2*  PLT 200   BMET  Recent Labs  12/25/12 0615  NA 137  K 4.2  CL 99  CO2 30  GLUCOSE 162*  BUN 15  CREATININE 0.97  CALCIUM 8.7    Anti-infectives: Anti-infectives   Start     Dose/Rate Route Frequency Ordered Stop   12/24/12 2030  ceFAZolin (ANCEF) IVPB 1 g/50 mL premix     1 g 100 mL/hr over 30 Minutes Intravenous Every 8 hours 12/24/12 1631 12/25/12 0422   12/24/12 0600  ceFAZolin (ANCEF) IVPB 2 g/50 mL premix     2 g 100 mL/hr over 30 Minutes Intravenous On call to O.R. 12/23/12 1441 12/24/12 1234      Assessment/Plan: POD 2 left mastectomy/snbx 1. Po pain meds, she isn't sure whether this is causing nausea, will need to monitor 2. pulm toilet, needs to be out of bed more, will have pt evaluate today 3. Cont ssi as not really eating nl diet yet 4. Check labs in am 5. Cont drains 6. Will likely stay until tomorrow due to nausea 7. Will attempt to stimulate bowel function today  [facility] 12/26/2012

## 2012-12-26 NOTE — Progress Notes (Signed)
Up in chair having mild pain at incisional site 3/10.  Refuse pain medication at this time.  JP x 2 to bulb suction, secured to abdominal binder. Call light in reach.  Patient instructed to call for assistance as needed.

## 2012-12-26 NOTE — Progress Notes (Signed)
Pt ambulated tonight around the unit with NT was asymptomatic. Ilean Skill LPN

## 2012-12-27 ENCOUNTER — Telehealth (INDEPENDENT_AMBULATORY_CARE_PROVIDER_SITE_OTHER): Payer: Self-pay

## 2012-12-27 MED ORDER — PROMETHAZINE HCL 12.5 MG PO TABS: 12.5000 mg | ORAL_TABLET | Freq: Four times a day (QID) | ORAL | Status: DC | PRN

## 2012-12-27 MED ORDER — OXYCODONE HCL 5 MG PO TABS: 5.0000 mg | ORAL_TABLET | ORAL | Status: DC | PRN

## 2012-12-27 NOTE — Discharge Summary (Signed)
Physician Discharge Summary  Patient ID: Kristen [name] MRN: [id number] DOB/AGE: Nov 23, 1945 67 y.o.  Admit date: 12/24/2012 Discharge date: 12/27/2012  Admission Diagnoses: Diabetes mellitus Left breast dcis Discharge Diagnoses:  Active Problems:   * No active hospital problems. * same as above Postop nausea and vomiting  Discharged Condition: good  Hospital Course: 60 yof with dcis of left breast who elected to undergo left mastectomy with snbx without reconstruction.  She did well. Her path returns as a T7micN0 tumor and we have discussed results.  This initially was er/pr positive and prognostic panel on invasive component is pending.  She had some nausea/vomiting and pain control issues postop which are now resolved.  She has been slow to recover but has had no real issues.  She is ambulating, pain controlled now and tol diet.  Her incision and flaps remain viable. Drains have expected output.  Consults: None  Significant Diagnostic Studies: none  Treatments: surgery: left mastectomy and snbx  Discharge Exam: Blood pressure 128/68, pulse 65, temperature 98.5 F (36.9 C), temperature source Oral, resp. rate 18, height 5\' 3"  (1.6 m), weight 204 lb (92.534 kg), SpO2 96.00%. General appearance: no distress Resp: clear to auscultation bilaterally Cardio: regular rate and rhythm Incision/Wound:clean without infection, flaps viable, drains with expected output  Disposition: 01-Home or Self Care   Future Appointments Provider Department Dept Phone   03/14/2013 9:30 AM Dorena Bodo, PA-C [facility] [phone]       Medication List         aspirin EC 81 MG tablet  Take 81 mg by mouth daily.     atorvastatin 40 MG tablet  Commonly known as:  LIPITOR  Take 40 mg by mouth daily.     ezetimibe 10 MG tablet  Commonly known as:  ZETIA  Take 10 mg by mouth daily.     FLUoxetine 40 MG capsule  Commonly known as:  PROZAC  Take 40 mg by mouth daily.     ibuprofen 200 MG tablet  Commonly known as:  ADVIL,MOTRIN  Take 200 mg by mouth every 6 (six) hours as needed for pain.     insulin glargine 100 UNIT/ML injection  Commonly known as:  LANTUS  Inject 32 Units into the skin at bedtime.     lisinopril 10 MG tablet  Commonly known as:  PRINIVIL,ZESTRIL  Take 10 mg by mouth daily.     metFORMIN 1000 MG tablet  Commonly known as:  GLUCOPHAGE     omeprazole 20 MG capsule  Commonly known as:  PRILOSEC  Take 20 mg by mouth 2 (two) times daily.     oxybutynin 5 MG tablet  Commonly known as:  DITROPAN  Take 5 mg by mouth 2 (two) times daily.     oxyCODONE 5 MG immediate release tablet  Commonly known as:  Oxy IR/ROXICODONE  Take 1-2 tablets (5-10 mg total) by mouth every 4 (four) hours as needed.     STOOL SOFTENER PO  Take 1 capsule by mouth 2 (two) times daily.           Follow-up Information   Follow up with [facility], MD In 2 weeks.   Contact information:   [address] [place] [address] [phone]       Signed: Emelia Loron 12/27/2012, 8:45 AM

## 2012-12-27 NOTE — Telephone Encounter (Signed)
LMOM for pt to call back. I have made her a f/u appt with Dr Dwain Sarna the first day he is back in the office on 7/28 pt needs to arrive at 8:00 for 8:15. The pt can call us next week about the drains if one of the drains is putting out less than 30cc for 2 days then pt can come in for a nurse only to have a drain removed per Dr Dwain Sarna. Then pt would see Dr Dwain Sarna on 7/28 to have the other drain removed if ready.

## 2012-12-27 NOTE — Progress Notes (Signed)
Discharge Note. Discharge instructions and teaching done with pt and pt's daughter. Pt's daughter return demonstrated how to empty JP drain. Handouts given as well to document the output of drain. Rx's reviewed. Pt and pt's daughter reported that they didn't have any further questions. Pt ready for discharge.

## 2012-12-28 ENCOUNTER — Other Ambulatory Visit: Payer: Self-pay | Admitting: *Deleted

## 2012-12-28 ENCOUNTER — Telehealth: Payer: Self-pay | Admitting: *Deleted

## 2012-12-28 ENCOUNTER — Telehealth (INDEPENDENT_AMBULATORY_CARE_PROVIDER_SITE_OTHER): Payer: Self-pay

## 2012-12-28 DIAGNOSIS — C50412 Malignant neoplasm of upper-outer quadrant of left female breast: Secondary | ICD-10-CM

## 2012-12-28 DIAGNOSIS — C50912 Malignant neoplasm of unspecified site of left female breast: Secondary | ICD-10-CM

## 2012-12-28 NOTE — Telephone Encounter (Signed)
Called pt's daughter to check on pt. I advised them that I had left a message yesterday for the pt to call me. I wanted the pt to know about the drain info. If she has less than 30cc in 2 days for her to call me so I can make her a nurse visit next week to have one drain removed before her appt with Dr Dwain Sarna on 01/07/13. I also advised them that I did refer the pt's info over to the cancer center for medical oncology and they should hear next week for an appt if not I want them to call me so I can check on the status of the appt. The pt's daughter understands.

## 2012-12-28 NOTE — Telephone Encounter (Signed)
Called and spoke with patient's daughter Dewayne Hatch and confirmed appt. For 01/11/13 at 245pm with Dr. Welton Flakes.  Instructions and contact information given.

## 2013-01-03 ENCOUNTER — Telehealth (INDEPENDENT_AMBULATORY_CARE_PROVIDER_SITE_OTHER): Payer: Self-pay | Admitting: *Deleted

## 2013-01-03 ENCOUNTER — Other Ambulatory Visit: Payer: Self-pay | Admitting: *Deleted

## 2013-01-03 DIAGNOSIS — C50412 Malignant neoplasm of upper-outer quadrant of left female breast: Secondary | ICD-10-CM

## 2013-01-03 NOTE — Telephone Encounter (Signed)
Patient's daughter called in to report a small amount of whitish drainage into one of the drains.  Daughter denies fever, nausea, tenderness to drain site, and no redness to drain site.  Offered daughter appt in urgent office today to have drainage assessed however she states that she has to work 12p-10p today.  Daughter states she will just re-evaluate drainage in the morning and if it still has the whitish drainage she will bring patient in tomorrow to urgent office to have it looked at before the weekend.

## 2013-01-04 ENCOUNTER — Encounter (INDEPENDENT_AMBULATORY_CARE_PROVIDER_SITE_OTHER): Payer: Self-pay | Admitting: General Surgery

## 2013-01-04 ENCOUNTER — Encounter: Payer: Self-pay | Admitting: Cardiovascular Disease

## 2013-01-04 ENCOUNTER — Ambulatory Visit (INDEPENDENT_AMBULATORY_CARE_PROVIDER_SITE_OTHER): Payer: Medicare Other | Admitting: General Surgery

## 2013-01-04 VITALS — BP 110/70 | HR 74 | Resp 16 | Ht 63.0 in | Wt 197.6 lb

## 2013-01-04 DIAGNOSIS — Z9889 Other specified postprocedural states: Secondary | ICD-10-CM

## 2013-01-04 NOTE — Progress Notes (Signed)
Patient ID: Kristen [name], female   DOB: 27-Nov-1945, 67 y.o.   MRN: [id number] The patient is a 67 year old female who underwent a vasectomy per Dr. Dwain Sarna on 7/14/014. Patient was concerned about drainage that comes from her JP drains. She's had no fevers while at home.    On exam: We did clean dry and intact JP draining serosanguineous, drain site clean dry and intact  Assessment and plan: This is a patient that the drain output is normal. Patient requested pain medication which I will give her refill for the weekend. The patient see Dr. Dwain Sarna on Monday continue with that appointment.

## 2013-01-07 ENCOUNTER — Ambulatory Visit (INDEPENDENT_AMBULATORY_CARE_PROVIDER_SITE_OTHER): Payer: Medicare Other | Admitting: General Surgery

## 2013-01-07 ENCOUNTER — Encounter (INDEPENDENT_AMBULATORY_CARE_PROVIDER_SITE_OTHER): Payer: Self-pay | Admitting: General Surgery

## 2013-01-07 VITALS — BP 100/62 | HR 74 | Resp 18 | Ht 63.0 in | Wt 199.0 lb

## 2013-01-07 DIAGNOSIS — Z09 Encounter for follow-up examination after completed treatment for conditions other than malignant neoplasm: Secondary | ICD-10-CM

## 2013-01-07 MED ORDER — CEPHALEXIN 500 MG PO CAPS: 500.0000 mg | ORAL_CAPSULE | Freq: Two times a day (BID) | ORAL | Status: DC

## 2013-01-07 NOTE — Progress Notes (Signed)
Subjective:     Patient ID: Kristen [name], female   DOB: 12-May-1946, 67 y.o.   MRN: [id number]  HPI 38 yof who underwent left mastectomy and sentinel node biopsy for presumed dcis that ends up being a T68micN0 er/pr positive her2 negative tumor.  Her drains are putting out some serous to cloudy fluid and are close to being able to be removed.  She was seen last week in urgent office for concern over the drains but appeared to be fine.  She has no fevers.  Minimal spots on her binder noted.  There is some soreness in her left axilla.  Review of Systems     Objective:   Physical Exam Left mastectomy incision clean, some ecchymosis over inferior flap this is viable, at intersection of incision where I removed axillary tissue there is minimal erythema and some tenderness, no dehiscence    Assessment:     S/p left mastectomy/snbx     Plan:     She is doing well overall. Arm mobility is good. I think she may have some cellulitis and will need to keep an eye on lateral aspect of incision.  Will start her on keflex, continue drains for now and see back on Friday.

## 2013-01-08 ENCOUNTER — Other Ambulatory Visit (HOSPITAL_COMMUNITY): Payer: Medicare Other

## 2013-01-11 ENCOUNTER — Encounter (INDEPENDENT_AMBULATORY_CARE_PROVIDER_SITE_OTHER): Payer: Self-pay | Admitting: General Surgery

## 2013-01-11 ENCOUNTER — Other Ambulatory Visit (HOSPITAL_BASED_OUTPATIENT_CLINIC_OR_DEPARTMENT_OTHER): Payer: Medicare Other | Admitting: Lab

## 2013-01-11 ENCOUNTER — Encounter: Payer: Self-pay | Admitting: Oncology

## 2013-01-11 ENCOUNTER — Ambulatory Visit: Payer: Medicare Other

## 2013-01-11 ENCOUNTER — Ambulatory Visit (INDEPENDENT_AMBULATORY_CARE_PROVIDER_SITE_OTHER): Payer: Medicare Other | Admitting: General Surgery

## 2013-01-11 ENCOUNTER — Telehealth: Payer: Self-pay | Admitting: Oncology

## 2013-01-11 ENCOUNTER — Ambulatory Visit (HOSPITAL_BASED_OUTPATIENT_CLINIC_OR_DEPARTMENT_OTHER): Payer: Medicare Other | Admitting: Oncology

## 2013-01-11 VITALS — BP 118/70 | HR 72 | Temp 98.0°F | Resp 20 | Ht 63.0 in | Wt 198.9 lb

## 2013-01-11 VITALS — BP 110/74 | HR 68 | Resp 18 | Ht 63.0 in | Wt 198.0 lb

## 2013-01-11 DIAGNOSIS — Z09 Encounter for follow-up examination after completed treatment for conditions other than malignant neoplasm: Secondary | ICD-10-CM

## 2013-01-11 DIAGNOSIS — C50412 Malignant neoplasm of upper-outer quadrant of left female breast: Secondary | ICD-10-CM

## 2013-01-11 DIAGNOSIS — C50919 Malignant neoplasm of unspecified site of unspecified female breast: Secondary | ICD-10-CM

## 2013-01-11 DIAGNOSIS — Z9010 Acquired absence of unspecified breast and nipple: Secondary | ICD-10-CM | POA: Diagnosis not present

## 2013-01-11 DIAGNOSIS — E119 Type 2 diabetes mellitus without complications: Secondary | ICD-10-CM

## 2013-01-11 DIAGNOSIS — M810 Age-related osteoporosis without current pathological fracture: Secondary | ICD-10-CM

## 2013-01-11 DIAGNOSIS — Z170 Estrogen receptor positive status [ER+]: Secondary | ICD-10-CM

## 2013-01-11 DIAGNOSIS — C50419 Malignant neoplasm of upper-outer quadrant of unspecified female breast: Secondary | ICD-10-CM | POA: Diagnosis not present

## 2013-01-11 LAB — COMPREHENSIVE METABOLIC PANEL (CC13)
Albumin: 3.4 g/dL — ABNORMAL LOW (ref 3.5–5.0)
CO2: 24 mEq/L (ref 22–29)
Calcium: 9.9 mg/dL (ref 8.4–10.4)
Glucose: 112 mg/dl (ref 70–140)
Potassium: 5.6 mEq/L — ABNORMAL HIGH (ref 3.5–5.1)
Sodium: 142 mEq/L (ref 136–145)
Total Bilirubin: 0.29 mg/dL (ref 0.20–1.20)
Total Protein: 7.6 g/dL (ref 6.4–8.3)

## 2013-01-11 LAB — CBC WITH DIFFERENTIAL/PLATELET
Eosinophils Absolute: 0.3 10*3/uL (ref 0.0–0.5)
LYMPH%: 33.5 % (ref 14.0–49.7)
MONO#: 0.7 10*3/uL (ref 0.1–0.9)
NEUT#: 3.9 10*3/uL (ref 1.5–6.5)
Platelets: 357 10*3/uL (ref 145–400)
RBC: 4 10*6/uL (ref 3.70–5.45)
WBC: 7.6 10*3/uL (ref 3.9–10.3)

## 2013-01-11 MED ORDER — EXEMESTANE 25 MG PO TABS: 25.0000 mg | ORAL_TABLET | Freq: Every day | ORAL | Status: DC

## 2013-01-11 NOTE — Progress Notes (Signed)
Checked in new patient. No financial issues.I gave her and daughter a living will/POA booklet. I added mail and phone and wants to keep email address(it is daughter's).

## 2013-01-11 NOTE — Patient Instructions (Addendum)
We discussed your diagnosis of breast cancer  We discussed treatment options with anti-estrogen to help prevent further cancer recurrence  We discussed use of aromasin and side effects  Exemestane tablets What is this medicine? EXEMESTANE (ex e MES tane) blocks the production of the hormone estrogen. Some types of breast cancer depend on estrogen to grow, and this medicine can stop tumor growth by blocking estrogen production. This medicine is for the treatment of breast cancer in postmenopausal women only. This medicine may be used for other purposes; ask your health care provider or pharmacist if you have questions. What should I tell my health care provider before I take this medicine? They need to know if you have any of these conditions: -an unusual or allergic reaction to exemestane, other medicines, foods, dyes, or preservatives -pregnant or trying to get pregnant -breast-feeding How should I use this medicine? Take this medicine by mouth with a glass of water. Follow the directions on the prescription label. Take your doses at regular intervals after a meal. Do not take your medicine more often than directed. Do not stop taking except on the advice of your doctor or health care professional. Contact your pediatrician regarding the use of this medicine in children. Special care may be needed. Overdosage: If you think you have taken too much of this medicine contact a poison control center or emergency room at once. NOTE: This medicine is only for you. Do not share this medicine with others. What if I miss a dose? If you miss a dose, take the next dose as usual. Do not try to make up the missed dose. Do not take double or extra doses. What may interact with this medicine? Do not take this medicine with any of the following medications: -female hormones, like estrogens and birth control pills This medicine may also interact with the following  medications: -androstenedione -phenytoin -rifabutin, rifampin, or rifapentine -St. John's Wort This list may not describe all possible interactions. Give your health care provider a list of all the medicines, herbs, non-prescription drugs, or dietary supplements you use. Also tell them if you smoke, drink alcohol, or use illegal drugs. Some items may interact with your medicine. What should I watch for while using this medicine? Visit your doctor or health care professional for regular checks on your progress. If you experience hot flashes or sweating while taking this medicine, avoid alcohol, smoking and drinks with caffeine. This may help to decrease these side effects. What side effects may I notice from receiving this medicine? Side effects that you should report to your doctor or health care professional as soon as possible: -any new or unusual symptoms -changes in vision -fever -leg or arm swelling -pain in bones, joints, or muscles -pain in hips, back, ribs, arms, shoulders, or legs Side effects that usually do not require medical attention (report to your doctor or health care professional if they continue or are bothersome): -difficulty sleeping -headache -hot flashes -sweating -unusually weak or tired This list may not describe all possible side effects. Call your doctor for medical advice about side effects. You may report side effects to FDA at 1-800-FDA-1088. Where should I keep my medicine? Keep out of the reach of children. Store at room temperature between 15 and 30 degrees C (59 and 86 degrees F). Throw away any unused medicine after the expiration date. NOTE: This sheet is a summary. It may not cover all possible information. If you have questions about this medicine, talk to your doctor,  pharmacist, or health care provider.  2013, [employer]. (10/02/2007 11:48:29 AM)

## 2013-01-13 NOTE — Progress Notes (Signed)
Subjective:     Patient ID: Kristen [name], female   DOB: 1945/10/19, 67 y.o.   MRN: [id number]  HPI 1 yof who underwent left mastectomy and sentinel node biopsy for T82mic N0 tumor. She didn't want to undergo reconstruction.  She has done well except for some redness at lateral portion of the incision that I started her on for abx.  She returns today doing well with minimal output from drains. She denies fevers. She denies any drainage.  Review of Systems     Objective:   Physical Exam Left mastectomy scar healing with minimal redness laterally , healing and wound is all together, drains in place    Assessment:     S/p left mastectomy    Plan:     I removed one drain today.  Will take other one out next week.  She is doing well overall and any infection looks to be clearing.will refer to cancer center again and then will send to PT once other drain is out.

## 2013-01-14 ENCOUNTER — Telehealth (INDEPENDENT_AMBULATORY_CARE_PROVIDER_SITE_OTHER): Payer: Self-pay | Admitting: General Surgery

## 2013-01-14 NOTE — Telephone Encounter (Signed)
Spoke with pt's daughter to find out how her mother is doing and to see what her drain output is looking like.  She explained that her mother is doing well and that she is changing the bulb BID w/ 5 cc of output each time.  I explained that since it is under 30 cc I would speak with Dr. Dwain Sarna and see if he wants me to bring her in as a nurse only or if he would like to do it himself.

## 2013-01-14 NOTE — Telephone Encounter (Signed)
LMOM letting pt know that she is scheduled for a nurse only appt to remove her drain on 01/15/13 at 10:30.

## 2013-01-15 ENCOUNTER — Encounter (INDEPENDENT_AMBULATORY_CARE_PROVIDER_SITE_OTHER): Payer: Self-pay

## 2013-01-15 ENCOUNTER — Ambulatory Visit (INDEPENDENT_AMBULATORY_CARE_PROVIDER_SITE_OTHER): Payer: Medicare Other

## 2013-01-15 VITALS — BP 118/72 | HR 72 | Temp 97.0°F | Resp 16 | Ht 63.0 in | Wt 198.8 lb

## 2013-01-15 DIAGNOSIS — Z4803 Encounter for change or removal of drains: Secondary | ICD-10-CM

## 2013-01-15 DIAGNOSIS — Z4889 Encounter for other specified surgical aftercare: Secondary | ICD-10-CM

## 2013-01-15 NOTE — Progress Notes (Signed)
Patient comes into office today for drain removal post left breast mastectomy.  Patient reports her drain output to be less than 15 mL's over the last 48 hours.  Suture removed at drain site, JP Drain was then removed.  Patient tolerated well.  Drain site appears to be healing well.  Dry gauze placed over drain site.  Patient given a post op appointment with Dr. Dwain Sarna on 01/28/13.  Patient advised to call our office with any questions or concerns.  Patient verbalized understanding.

## 2013-01-21 ENCOUNTER — Other Ambulatory Visit: Payer: Self-pay | Admitting: Physician Assistant

## 2013-01-28 ENCOUNTER — Encounter (INDEPENDENT_AMBULATORY_CARE_PROVIDER_SITE_OTHER): Payer: Self-pay | Admitting: General Surgery

## 2013-01-28 ENCOUNTER — Ambulatory Visit (INDEPENDENT_AMBULATORY_CARE_PROVIDER_SITE_OTHER): Payer: Medicare Other | Admitting: General Surgery

## 2013-01-28 VITALS — BP 124/80 | HR 68 | Temp 97.8°F | Resp 14 | Ht 63.0 in | Wt 199.0 lb

## 2013-01-28 DIAGNOSIS — Z09 Encounter for follow-up examination after completed treatment for conditions other than malignant neoplasm: Secondary | ICD-10-CM

## 2013-01-29 ENCOUNTER — Telehealth: Payer: Self-pay | Admitting: Family Medicine

## 2013-01-29 MED ORDER — LISINOPRIL 10 MG PO TABS: 10.0000 mg | ORAL_TABLET | Freq: Every day | ORAL | Status: DC

## 2013-01-29 MED ORDER — ATORVASTATIN CALCIUM 40 MG PO TABS: 40.0000 mg | ORAL_TABLET | Freq: Every day | ORAL | Status: DC

## 2013-01-29 NOTE — Progress Notes (Signed)
Subjective:     Patient ID: Kristen [name], female   DOB: 05/09/1946, 67 y.o.   MRN: [id number]  HPI  81 yof who underwent left mastectomy and sentinel node biopsy for T86mic N0 tumor. She didn't want to undergo reconstruction. She has done well except for some redness at lateral portion of the incision that I started her on for abx. Both of her drains have been removed.  She is doing well overall.  No fevers and slowly regaining energy.  She is concerned about lateral aspect of wound as this has separated some.  No real drainage.  Review of Systems     Objective:   Physical Exam Left mastectomy incision clean without infection, the lateral portion at the Y area has some superficial separation with some exudate and scab    Assessment:     Superficial dehiscence of left mastectomy wound     Plan:     Im not surprised with infection she had this has occurred.  We discussed local wound care and I think this will heal in next several weeks.  I will see back then.

## 2013-01-29 NOTE — Telephone Encounter (Addendum)
Atorvastatin 40 mg tab 1 QD #30 Lisinopril 10 mg tab 1 QD #30

## 2013-02-03 NOTE — Progress Notes (Signed)
Kristen [name] [id number] 17-Feb-1946 67 y.o. 02/03/2013 5:10 PM  CC  Frazier Richards, PA-C [address] [place] [address] Dr. Emelia Loron  REASON FOR CONSULTATION:  67 year old female for DCIS/T34mic status post mastectomy. Patient is seen in medical oncology for discussion of treatment options.  STAGE:   No matching staging information was found for the patient.  REFERRING PHYSICIAN: Dr. Emelia Loron  HISTORY OF PRESENT ILLNESS:  Kirbie Stodghill is a 67 y.o. female.  Who had a left breast bloody nipple discharge and because of this she had a mammogram performed that showed an abnormal area. She also had an MRI that showed this area to be larger extending from nipple to upper outer quadrant. She had biopsies performed that showed the tumor to be ER-positive PR-positive DCIS. Because of this she underwent a mastectomy with sentinel lymph node biopsy performed on 12/24/2012. The final pathology did reveal invasive ductal carcinoma grade 2 at least 2 foci each measuring less than 0.1 cm with associated DCIS high-grade spanning 2.6 and 1.4 cm. 6 sentinel nodes were negative for metastatic disease. Tumor was ER +100% PR +100% HER-2/neu negative with a Ki-67 14%. Postoperatively patient is doing well. She is now seen in medical oncology for discussion of treatment options adjuvantly. She is without any complaints.   Past Medical History: Past Medical History  Diagnosis Date  . Diabetes mellitus   . Coronary artery disease, non-occlusive     cath 2010 by Dr Riley Kill revealed scattered nonobstrructive disease with preserved EF  . Hyperlipidemia   . Allergy   . Anxiety   . Depression   . Arthritis   . Hypertension   . Hemorrhoids   . Obesity   . Overactive bladder   . Vitamin D deficiency   . GERD (gastroesophageal reflux disease)   . Cancer     breast    Past Surgical History: Past Surgical History  Procedure Laterality Date  . Oophorectomy Right     1.5   ovaries rem  . Replacement total knee      both knees  . Back surgery    . Partial hip arthroplasty      left hip  . Cholecystectomy, laparoscopic    . Lithotripsy    . Cesarean section    . Joint replacement    . Cholecystectomy    . Tonsillectomy    . Appendectomy    . Cardiac catheterization  01/2009    cath by Dr Riley Kill revealed nonobstructive CAD  . Total mastectomy Left 12/24/2012    Dr Dwain Sarna  . Mastectomy w/ sentinel node biopsy Left 12/24/2012    Procedure: LEFT MASTECTOMY WITH LEFT SENTINEL LYMPH NODE BIOPSY;  Surgeon: Emelia Loron, MD;  Location: [facility] OR;  Service: General;  Laterality: Left;    Family History: Family History  Problem Relation Age of Onset  . Cancer Mother     mouth cancer  . Breast cancer Mother     possibly dx in her 67s  . Lung cancer Brother 4  . Lung cancer Brother   . Lung cancer Brother   . Heart attack Father 2  . Brain cancer Brother     Social History History  Substance Use Topics  . Smoking status: Never Smoker   . Smokeless tobacco: Never Used  . Alcohol Use: No    Allergies: Allergies  Allergen Reactions  . Sulfonamide Derivatives Hives    Current Medications: Current Outpatient Prescriptions  Medication Sig Dispense Refill  .  aspirin EC 81 MG tablet Take 81 mg by mouth daily.      . cephALEXin (KEFLEX) 500 MG capsule Take 1 capsule (500 mg total) by mouth 2 (two) times daily.  20 capsule  0  . Docusate Calcium (STOOL SOFTENER PO) Take 1 capsule by mouth 2 (two) times daily.       Marland Kitchen ezetimibe (ZETIA) 10 MG tablet Take 10 mg by mouth daily.      Marland Kitchen FLUoxetine (PROZAC) 40 MG capsule Take 40 mg by mouth daily.      Marland Kitchen ibuprofen (ADVIL,MOTRIN) 200 MG tablet Take 200 mg by mouth every 6 (six) hours as needed for pain.      Marland Kitchen insulin glargine (LANTUS) 100 UNIT/ML injection Inject 32 Units into the skin at bedtime.      . metFORMIN (GLUCOPHAGE) 1000 MG tablet       . omeprazole (PRILOSEC) 20 MG capsule Take 20 mg by  mouth 2 (two) times daily.      Marland Kitchen oxybutynin (DITROPAN) 5 MG tablet Take 5 mg by mouth 2 (two) times daily.      Marland Kitchen oxyCODONE (OXY IR/ROXICODONE) 5 MG immediate release tablet Take 1-2 tablets (5-10 mg total) by mouth every 4 (four) hours as needed.  30 tablet  0  . promethazine (PHENERGAN) 12.5 MG tablet Take 1 tablet (12.5 mg total) by mouth every 6 (six) hours as needed for nausea.  10 tablet  0  . atorvastatin (LIPITOR) 40 MG tablet Take 1 tablet (40 mg total) by mouth daily.  30 tablet  5  . exemestane (AROMASIN) 25 MG tablet Take 1 tablet (25 mg total) by mouth daily after breakfast.  90 tablet  6  . lisinopril (PRINIVIL,ZESTRIL) 10 MG tablet Take 1 tablet (10 mg total) by mouth daily.  30 tablet  5  . metFORMIN (GLUCOPHAGE) 1000 MG tablet TAKE ONE TABLET BY MOUTH TWICE DAILY  60 tablet  2  . [DISCONTINUED] oxybutynin (DITROPAN) 5 MG tablet Take 5 mg by mouth 2 (two) times daily.         No current facility-administered medications for this visit.    OB/GYN History: menarche at age 8 she underwent menopause at 1 no hormone replacement therapy she is G2 P1 first live birth at 71  Fertility Discussion: n/a Prior History of Cancer: no  Health Maintenance:  Colonoscopy yes Bone Density no Last PAP smear unknown  ECOG PERFORMANCE STATUS: 0 - Asymptomatic  Genetic Counseling/testing: no  REVIEW OF SYSTEMS:  Pertinent items are noted in HPI.  PHYSICAL EXAMINATION: Blood pressure 118/70, pulse 72, temperature 98 F (36.7 C), temperature source Oral, resp. rate 20, height 5\' 3"  (1.6 m), weight 198 lb 14.4 oz (90.22 kg).  HEENT exam EOMI PERRLA sclerae anicteric no conjunctival pallor oral mucosa is moist neck is supple lungs are clear to auscultation cardiovascular regular rate rhythm abdomen soft nontender no HSM extremities no edema neuro is nonfocal left mastectomy site looks healing right breast no masses or nipple discharge per    STUDIES/RESULTS: No results  found.   LABS:    Chemistry      Component Value Date/Time   NA 142 01/11/2013 1438   NA 137 12/25/2012 0615   K 5.6* 01/11/2013 1438   K 4.2 12/25/2012 0615   CL 99 12/25/2012 0615   CO2 24 01/11/2013 1438   CO2 30 12/25/2012 0615   BUN 19.6 01/11/2013 1438   BUN 15 12/25/2012 0615   CREATININE 1.1 01/11/2013 1438  CREATININE 0.97 12/25/2012 0615   CREATININE 1.00 09/07/2012 1557      Component Value Date/Time   CALCIUM 9.9 01/11/2013 1438   CALCIUM 8.7 12/25/2012 0615   ALKPHOS 68 01/11/2013 1438   ALKPHOS 67 12/17/2012 1450   AST 18 01/11/2013 1438   AST 23 12/17/2012 1450   ALT 17 01/11/2013 1438   ALT 35 12/17/2012 1450   BILITOT 0.29 01/11/2013 1438   BILITOT 0.3 12/17/2012 1450      Lab Results  Component Value Date   WBC 7.6 01/11/2013   HGB 11.8 01/11/2013   HCT 36.0 01/11/2013   MCV 89.9 01/11/2013   PLT 357 01/11/2013       PATHOLOGY:  ASSESSMENT    67 year old female with  #1 stage I (T1 N0) invasive ductal carcinoma with high-grade DCIS status post mastectomy with sentinel lymph node biopsy. Postoperatively she is doing well. Patient and I discussed her staging, pathophysiology of breast cancer, and her treatment options. Since patient has had a mastectomy and her lymph nodes were negative she does not need to receive radiation therapy.  #2 we discussed adjuvant antiestrogen therapy. We discussed the rationale for this. My recommendation is an aromatase inhibitor Aromasin 25 mg daily for 5 years. We discussed risks benefits and side effects of the treatment in detail  Clinical Trial Eligibility: no Multidisciplinary conference discussion no     PLAN:    1.proceed with Aromasin 25 mg daily.  #2 I will see the patient back in 3 months time for follow        Discussion: Patient is being treated per NCCN breast cancer care guidelines appropriate for stage.I   Thank you so much for allowing me to participate in the care of [facility]. I will continue to follow up the patient  with you and assist in her care.  All questions were answered. The patient knows to call the clinic with any problems, questions or concerns. We can certainly see the patient much sooner if necessary.  I spent 55 minutes counseling the patient face to face. The total time spent in the appointment was 55 minutes.  Drue Second, MD Medical/Oncology [facility] [phone] (beeper) [phone] (Office)

## 2013-02-19 ENCOUNTER — Encounter (INDEPENDENT_AMBULATORY_CARE_PROVIDER_SITE_OTHER): Payer: Self-pay | Admitting: General Surgery

## 2013-02-19 ENCOUNTER — Ambulatory Visit (INDEPENDENT_AMBULATORY_CARE_PROVIDER_SITE_OTHER): Payer: Medicare Other | Admitting: General Surgery

## 2013-02-19 VITALS — BP 128/64 | HR 78 | Resp 18 | Ht 63.0 in | Wt 195.0 lb

## 2013-02-19 DIAGNOSIS — Z09 Encounter for follow-up examination after completed treatment for conditions other than malignant neoplasm: Secondary | ICD-10-CM

## 2013-02-19 NOTE — Progress Notes (Signed)
Subjective:     Patient ID: Kristen [name], female   DOB: 05/11/1946, 67 y.o.   MRN: [id number]  HPI 67 yof who underwent left mastectomy and sentinel node biopsy for T64micN0 tumor. She didn't want to undergo reconstruction. She has open wound lateral to this now that is slowly healing.  She has no new complaints.denies fevers.    Review of Systems     Objective:   Physical Exam Wound healing, laterally there is small superficial opening but no infection    Assessment:     Healing mastectomy wound     Plan:     I think this should be healed by the next time I see her. I will plan on seeing her back in about one month she'll continue the dressing changes until then.

## 2013-03-12 ENCOUNTER — Other Ambulatory Visit: Payer: Self-pay | Admitting: Physician Assistant

## 2013-03-12 NOTE — Telephone Encounter (Signed)
Pt has been dismissed.  Refill denied 

## 2013-03-14 ENCOUNTER — Ambulatory Visit: Payer: Medicare Other | Admitting: Physician Assistant

## 2013-03-15 ENCOUNTER — Ambulatory Visit: Payer: Medicare Other | Admitting: Physician Assistant

## 2013-03-18 ENCOUNTER — Encounter (INDEPENDENT_AMBULATORY_CARE_PROVIDER_SITE_OTHER): Payer: Self-pay | Admitting: General Surgery

## 2013-03-18 ENCOUNTER — Ambulatory Visit (INDEPENDENT_AMBULATORY_CARE_PROVIDER_SITE_OTHER): Payer: Medicare Other | Admitting: General Surgery

## 2013-03-18 VITALS — BP 118/70 | HR 68 | Temp 98.2°F | Resp 14 | Ht 63.0 in | Wt 203.2 lb

## 2013-03-18 DIAGNOSIS — Z09 Encounter for follow-up examination after completed treatment for conditions other than malignant neoplasm: Secondary | ICD-10-CM

## 2013-03-18 NOTE — Progress Notes (Signed)
Subjective:     Patient ID: Kristen [name], female   DOB: 11/20/1945, 67 y.o.   MRN: [id number]  HPI 55 yof who underwent left mastectomy and sentinel node biopsy for T66micN0 tumor. She didn't want to undergo reconstruction. The lateral portion of her wound opened up and this has been healing by secondary intention. She recently fell on this area and opened it up a little bit more. She otherwise has no real complaints at all just comes in for routine followup today.   Review of Systems     Objective:   Physical Exam Left mastectomy incision healing without infection. The flaps are viable and there is no fluid. Laterally there is about a 1 x 1 cm area of some hypertrophic granulation tissue present.    Assessment:     Status post left mastectomy     Plan:     I placed some silver nitrate on this and I think this will heal fairly quickly now. I will plan on seeing her back in about 3-4 weeks to see if we need to do this again. I told her if this heals over by that point in time and she has no more wound and she can see me in 6 months.

## 2013-03-27 ENCOUNTER — Telehealth: Payer: Self-pay | Admitting: Family Medicine

## 2013-03-27 NOTE — Telephone Encounter (Signed)
Chart flagged as patient is dismissed.  Refill denied.

## 2013-04-15 ENCOUNTER — Encounter (INDEPENDENT_AMBULATORY_CARE_PROVIDER_SITE_OTHER): Payer: Medicare Other | Admitting: General Surgery

## 2013-04-19 ENCOUNTER — Ambulatory Visit (HOSPITAL_BASED_OUTPATIENT_CLINIC_OR_DEPARTMENT_OTHER): Payer: Medicare Other | Admitting: Oncology

## 2013-04-19 ENCOUNTER — Telehealth: Payer: Self-pay | Admitting: Oncology

## 2013-04-19 ENCOUNTER — Encounter: Payer: Self-pay | Admitting: Oncology

## 2013-04-19 ENCOUNTER — Other Ambulatory Visit (HOSPITAL_BASED_OUTPATIENT_CLINIC_OR_DEPARTMENT_OTHER): Payer: Medicare Other | Admitting: Lab

## 2013-04-19 VITALS — BP 158/68 | HR 67 | Temp 98.2°F | Resp 20 | Ht 63.0 in | Wt 199.7 lb

## 2013-04-19 DIAGNOSIS — Z170 Estrogen receptor positive status [ER+]: Secondary | ICD-10-CM | POA: Diagnosis not present

## 2013-04-19 DIAGNOSIS — C50412 Malignant neoplasm of upper-outer quadrant of left female breast: Secondary | ICD-10-CM

## 2013-04-19 DIAGNOSIS — C50419 Malignant neoplasm of upper-outer quadrant of unspecified female breast: Secondary | ICD-10-CM

## 2013-04-19 DIAGNOSIS — D0512 Intraductal carcinoma in situ of left breast: Secondary | ICD-10-CM

## 2013-04-19 LAB — CBC WITH DIFFERENTIAL/PLATELET
Eosinophils Absolute: 0.2 10*3/uL (ref 0.0–0.5)
MONO#: 0.8 10*3/uL (ref 0.1–0.9)
NEUT#: 4.3 10*3/uL (ref 1.5–6.5)
RBC: 4.22 10*6/uL (ref 3.70–5.45)
RDW: 14.7 % — ABNORMAL HIGH (ref 11.2–14.5)
WBC: 8.6 10*3/uL (ref 3.9–10.3)
lymph#: 3.2 10*3/uL (ref 0.9–3.3)

## 2013-04-19 LAB — COMPREHENSIVE METABOLIC PANEL (CC13)
ALT: 26 U/L (ref 0–55)
AST: 17 U/L (ref 5–34)
Alkaline Phosphatase: 74 U/L (ref 40–150)
CO2: 24 mEq/L (ref 22–29)
Calcium: 10.5 mg/dL — ABNORMAL HIGH (ref 8.4–10.4)
Glucose: 127 mg/dl (ref 70–140)
Potassium: 5 mEq/L (ref 3.5–5.1)
Sodium: 142 mEq/L (ref 136–145)

## 2013-04-19 NOTE — Telephone Encounter (Signed)
, °

## 2013-04-19 NOTE — Progress Notes (Signed)
Kristen [name] [id number] 1945-12-29 67 y.o. 04/19/2013 2:09 PM  CC  Kristen Richards, PA-C [address] [place] [address] Dr. Emelia Loron  Diagnosis:  67 year old female for DCIS/T19mic status post mastectomy. Patient is seen in medical oncology for discussion of treatment options.  STAGE:  DCIS/TI Microscopic, left breast ER /PR positive Stage I   REFERRING PHYSICIAN: Dr. Emelia Loron  Prior therapy:  Kristen [name] is a 67 y.o. female.    #1 Who had a left breast bloody nipple discharge and because of this she had a mammogram performed that showed an abnormal area. She also had an MRI that showed this area to be larger extending from nipple to upper outer quadrant. She had biopsies performed that showed the tumor to be ER-positive PR-positive DCIS.   #2 she underwent a mastectomy with sentinel lymph node biopsy performed on 12/24/2012. The final pathology did reveal invasive ductal carcinoma grade 2 at least 2 foci each measuring less than 0.1 cm with associated DCIS high-grade spanning 2.6 and 1.4 cm. 6 sentinel nodes were negative for metastatic disease. Tumor was ER +100% PR +100% HER-2/neu negative with a Ki-67 14%.   #3 patient was begun on Aromasin 25 mg daily adjuvantly with curative intent starting August 2014 total of 5 years of therapy is planned  Current therapy: Aromasin 25 mg daily  Interval history: Patient is seen in followup today. She has been on Aromasin for 3 months now. Overall she is tolerating it well. But she is having significant mood swings per her daughter. She is also experiencing hot flashes off and on. No aches or pains no myalgias and arthralgias. She occasionally does get headaches. She is denying any fevers chills night sweats no chest pains no shortness of breath no cough hemoptysis hematemesis no abdominal pain. No diarrhea or constipation. Remainder of the 10 point review of systems is negative.  Past Medical History: Past  Medical History  Diagnosis Date  . Diabetes mellitus   . Coronary artery disease, non-occlusive     cath 2010 by Dr Riley Kill revealed scattered nonobstrructive disease with preserved EF  . Hyperlipidemia   . Allergy   . Anxiety   . Depression   . Arthritis   . Hypertension   . Hemorrhoids   . Obesity   . Overactive bladder   . Vitamin D deficiency   . GERD (gastroesophageal reflux disease)   . Cancer     breast    Past Surgical History: Past Surgical History  Procedure Laterality Date  . Oophorectomy Right     1.5  ovaries rem  . Replacement total knee      both knees  . Back surgery    . Partial hip arthroplasty      left hip  . Cholecystectomy, laparoscopic    . Lithotripsy    . Cesarean section    . Joint replacement    . Cholecystectomy    . Tonsillectomy    . Appendectomy    . Cardiac catheterization  01/2009    cath by Dr Riley Kill revealed nonobstructive CAD  . Total mastectomy Left 12/24/2012    Dr Dwain Sarna  . Mastectomy w/ sentinel node biopsy Left 12/24/2012    Procedure: LEFT MASTECTOMY WITH LEFT SENTINEL LYMPH NODE BIOPSY;  Surgeon: Emelia Loron, MD;  Location: [facility] OR;  Service: General;  Laterality: Left;  . Breast surgery      Family History: Family History  Problem Relation Age of Onset  . Cancer Mother  mouth cancer  . Breast cancer Mother     possibly dx in her 3s  . Lung cancer Brother 55  . Lung cancer Brother   . Lung cancer Brother   . Heart attack Father 64  . Brain cancer Brother     Social History History  Substance Use Topics  . Smoking status: Never Smoker   . Smokeless tobacco: Never Used  . Alcohol Use: No    Allergies: Allergies  Allergen Reactions  . Sulfonamide Derivatives Hives    Current Medications: Current Outpatient Prescriptions  Medication Sig Dispense Refill  . aspirin EC 81 MG tablet Take 81 mg by mouth daily.      Marland Kitchen atorvastatin (LIPITOR) 40 MG tablet Take 1 tablet (40 mg total) by mouth daily.   30 tablet  5  . cephALEXin (KEFLEX) 500 MG capsule Take 1 capsule (500 mg total) by mouth 2 (two) times daily.  20 capsule  0  . Docusate Calcium (STOOL SOFTENER PO) Take 1 capsule by mouth 2 (two) times daily.       Marland Kitchen exemestane (AROMASIN) 25 MG tablet Take 1 tablet (25 mg total) by mouth daily after breakfast.  90 tablet  6  . ezetimibe (ZETIA) 10 MG tablet Take 10 mg by mouth daily.      Marland Kitchen FLUoxetine (PROZAC) 40 MG capsule Take 40 mg by mouth daily.      Marland Kitchen ibuprofen (ADVIL,MOTRIN) 200 MG tablet Take 200 mg by mouth every 6 (six) hours as needed for pain.      Marland Kitchen insulin glargine (LANTUS) 100 UNIT/ML injection Inject 32 Units into the skin at bedtime.      Marland Kitchen lisinopril (PRINIVIL,ZESTRIL) 10 MG tablet Take 1 tablet (10 mg total) by mouth daily.  30 tablet  5  . metFORMIN (GLUCOPHAGE) 1000 MG tablet       . metFORMIN (GLUCOPHAGE) 1000 MG tablet TAKE ONE TABLET BY MOUTH TWICE DAILY  60 tablet  2  . omeprazole (PRILOSEC) 20 MG capsule Take 20 mg by mouth 2 (two) times daily.      Marland Kitchen oxybutynin (DITROPAN) 5 MG tablet Take 5 mg by mouth 2 (two) times daily.      . promethazine (PHENERGAN) 12.5 MG tablet Take 1 tablet (12.5 mg total) by mouth every 6 (six) hours as needed for nausea.  10 tablet  0  . [DISCONTINUED] oxybutynin (DITROPAN) 5 MG tablet Take 5 mg by mouth 2 (two) times daily.         No current facility-administered medications for this visit.    OB/GYN History: menarche at age 84 she underwent menopause at 19 no hormone replacement therapy she is G2 P1 first live birth at 57  Fertility Discussion: n/a Prior History of Cancer: no  Health Maintenance:  Colonoscopy yes Bone Density no Last PAP smear unknown  ECOG PERFORMANCE STATUS: 0 - Asymptomatic  Genetic Counseling/testing: no  REVIEW OF SYSTEMS:  Pertinent items are noted in HPI.  PHYSICAL EXAMINATION: Blood pressure 158/68, pulse 67, temperature 98.2 F (36.8 C), temperature source Oral, resp. rate 20, height 5\' 3"   (1.6 m), weight 199 lb 11.2 oz (90.583 kg).  HEENT exam EOMI PERRLA sclerae anicteric no conjunctival pallor oral mucosa is moist neck is supple lungs are clear to auscultation cardiovascular regular rate rhythm abdomen soft nontender no HSM extremities no edema neuro is nonfocal left mastectomy site looks healing right breast no masses or nipple discharge per    STUDIES/RESULTS: No results found.   LABS:  Chemistry      Component Value Date/Time   NA 142 04/19/2013 1331   NA 137 12/25/2012 0615   K 5.0 04/19/2013 1331   K 4.2 12/25/2012 0615   CL 99 12/25/2012 0615   CO2 24 04/19/2013 1331   CO2 30 12/25/2012 0615   BUN 19.6 04/19/2013 1331   BUN 15 12/25/2012 0615   CREATININE 1.0 04/19/2013 1331   CREATININE 0.97 12/25/2012 0615   CREATININE 1.00 09/07/2012 1557      Component Value Date/Time   CALCIUM 10.5* 04/19/2013 1331   CALCIUM 8.7 12/25/2012 0615   ALKPHOS 74 04/19/2013 1331   ALKPHOS 67 12/17/2012 1450   AST 17 04/19/2013 1331   AST 23 12/17/2012 1450   ALT 26 04/19/2013 1331   ALT 35 12/17/2012 1450   BILITOT 0.49 04/19/2013 1331   BILITOT 0.3 12/17/2012 1450      Lab Results  Component Value Date   WBC 8.6 04/19/2013   HGB 11.8 04/19/2013   HCT 36.2 04/19/2013   MCV 85.8 04/19/2013   PLT 255 04/19/2013     ASSESSMENT    67 year old female with  #1 stage I (T1 N0) invasive ductal carcinoma with high-grade DCIS status post mastectomy with sentinel lymph node biopsy. Postoperatively she is doing well. Patient and I discussed her staging, pathophysiology of breast cancer, and her treatment options. Since patient has had a mastectomy and her lymph nodes were negative she does not need to receive radiation therapy.  #2 patient was begun on Aromasin 25 mg daily starting August 2014. She has been tolerating it well except for hot flashes. She has no evidence of recurrent disease     PLAN:    1.Continue Aromasin 25 mg daily.  #2 I will see the patient back in 6 months time  for follow       Thank you so much for allowing me to participate in the care of [facility]. I will continue to follow up the patient with you and assist in her care.  All questions were answered. The patient knows to call the clinic with any problems, questions or concerns. We can certainly see the patient much sooner if necessary.  I spent 25 minutes counseling the patient face to face. The total time spent in the appointment was 30 minutes.  Drue Second, MD Medical/Oncology [facility] [phone] (beeper) [phone] (Office)

## 2013-04-22 ENCOUNTER — Other Ambulatory Visit: Payer: Self-pay | Admitting: Family Medicine

## 2013-04-23 ENCOUNTER — Encounter: Payer: Self-pay | Admitting: *Deleted

## 2013-04-23 NOTE — Progress Notes (Signed)
Mailed after appt letter to pt. 

## 2013-05-02 ENCOUNTER — Other Ambulatory Visit: Payer: Self-pay | Admitting: Physician Assistant

## 2013-05-06 ENCOUNTER — Other Ambulatory Visit: Payer: Self-pay | Admitting: Physician Assistant

## 2013-05-07 ENCOUNTER — Encounter: Payer: Self-pay | Admitting: Family Medicine

## 2013-05-07 NOTE — Telephone Encounter (Signed)
Medication refill for one time only.  Patient needs to be seen.  Letter sent for patient to call and schedule 

## 2013-06-13 ENCOUNTER — Other Ambulatory Visit: Payer: Self-pay | Admitting: Family Medicine

## 2013-06-26 ENCOUNTER — Encounter: Payer: Self-pay | Admitting: Family Medicine

## 2013-06-26 ENCOUNTER — Telehealth: Payer: Self-pay | Admitting: Family Medicine

## 2013-06-26 MED ORDER — LISINOPRIL 10 MG PO TABS: 10.0000 mg | ORAL_TABLET | Freq: Every day | ORAL | Status: DC

## 2013-06-26 NOTE — Telephone Encounter (Signed)
Medication refill for one time only.  Patient needs to be seen.  Letter sent for patient to call and schedule 

## 2013-07-24 ENCOUNTER — Ambulatory Visit (INDEPENDENT_AMBULATORY_CARE_PROVIDER_SITE_OTHER): Payer: Medicare Other | Admitting: Physician Assistant

## 2013-07-24 ENCOUNTER — Encounter: Payer: Self-pay | Admitting: Physician Assistant

## 2013-07-24 VITALS — BP 132/68 | HR 64 | Temp 98.1°F | Resp 12 | Ht 64.0 in | Wt 195.0 lb

## 2013-07-24 DIAGNOSIS — I10 Essential (primary) hypertension: Secondary | ICD-10-CM | POA: Diagnosis not present

## 2013-07-24 DIAGNOSIS — E785 Hyperlipidemia, unspecified: Secondary | ICD-10-CM

## 2013-07-24 DIAGNOSIS — D0510 Intraductal carcinoma in situ of unspecified breast: Secondary | ICD-10-CM

## 2013-07-24 DIAGNOSIS — K219 Gastro-esophageal reflux disease without esophagitis: Secondary | ICD-10-CM

## 2013-07-24 DIAGNOSIS — E669 Obesity, unspecified: Secondary | ICD-10-CM

## 2013-07-24 DIAGNOSIS — D0590 Unspecified type of carcinoma in situ of unspecified breast: Secondary | ICD-10-CM

## 2013-07-24 DIAGNOSIS — I2510 Atherosclerotic heart disease of native coronary artery without angina pectoris: Secondary | ICD-10-CM | POA: Diagnosis not present

## 2013-07-24 DIAGNOSIS — E1149 Type 2 diabetes mellitus with other diabetic neurological complication: Secondary | ICD-10-CM

## 2013-07-24 DIAGNOSIS — E559 Vitamin D deficiency, unspecified: Secondary | ICD-10-CM | POA: Diagnosis not present

## 2013-07-24 DIAGNOSIS — E1140 Type 2 diabetes mellitus with diabetic neuropathy, unspecified: Secondary | ICD-10-CM

## 2013-07-24 DIAGNOSIS — E1142 Type 2 diabetes mellitus with diabetic polyneuropathy: Secondary | ICD-10-CM

## 2013-07-24 DIAGNOSIS — E119 Type 2 diabetes mellitus without complications: Secondary | ICD-10-CM

## 2013-07-24 DIAGNOSIS — F341 Dysthymic disorder: Secondary | ICD-10-CM

## 2013-07-24 HISTORY — DX: Type 2 diabetes mellitus with diabetic neuropathy, unspecified: E11.40

## 2013-07-24 MED ORDER — GABAPENTIN 300 MG PO CAPS: 300.0000 mg | ORAL_CAPSULE | Freq: Three times a day (TID) | ORAL | Status: DC

## 2013-07-24 NOTE — Progress Notes (Signed)
Patient ID: Kristen [name] MRN: [id number], DOB: 1945-08-04, 68 y.o. Date of Encounter: @DATE @  Chief Complaint:  Chief Complaint  Patient presents with  . Other    medication refills- 6 month check up    HPI: 68 y.o. year old white female  presents routine followup office visit.  Her last office visit with me was 12/19/12. At that time she was here for  routine office visit as well as needing surgical clearance for mastectomy. Later that same day she also had an appointment with cardiology for presurgical clearance. Since Then she has undergone left mastectomy. As well she is on chemotherapy. Says that she takes one pill each day. No radiation therapy. Reports that she has had no other medical problems arise since her last visit here.  Diabetes: She states that her Lantus is still at 32 units each night. Is also taking metformin as directed. States that she checks her blood sugar twice every day. Fasting in the morning and again at bedtime. Fasting morning usually reads 130 to 135. Bedtime readings are usually around 150.  Taking blood pressure medications as directed with no adverse effects. No lower extremity edema and no lightheadedness.  Taking both Lipitor 40 and Zetia for her cholesterol with no adverse effects. No myalgias.  Her anxiety and depression are well-controlled with her Prozac 40 mg. Her mood is stable. Has no adverse effects of the medicine.   Past Medical History  Diagnosis Date  . Diabetes mellitus   . Coronary artery disease, non-occlusive     cath 2010 by Dr Lia Foyer revealed scattered nonobstrructive disease with preserved EF  . Hyperlipidemia   . Allergy   . Anxiety   . Depression   . Arthritis   . Hypertension   . Hemorrhoids   . Obesity   . Overactive bladder   . Vitamin D deficiency   . GERD (gastroesophageal reflux disease)   . Cancer     breast     Home Meds: See attached medication section for current medication list. Any medications  entered into computer today will not appear on this note's list. The medications listed below were entered prior to today. Current Outpatient Prescriptions on File Prior to Visit  Medication Sig Dispense Refill  . aspirin EC 81 MG tablet Take 81 mg by mouth daily.      Marland Kitchen atorvastatin (LIPITOR) 40 MG tablet Take 1 tablet (40 mg total) by mouth daily.  30 tablet  5  . Docusate Calcium (STOOL SOFTENER PO) Take 1 capsule by mouth 2 (two) times daily.       Marland Kitchen exemestane (AROMASIN) 25 MG tablet Take 1 tablet (25 mg total) by mouth daily after breakfast.  90 tablet  6  . ezetimibe (ZETIA) 10 MG tablet Take 10 mg by mouth daily.      Marland Kitchen FLUoxetine (PROZAC) 40 MG capsule Take 40 mg by mouth daily.      Marland Kitchen ibuprofen (ADVIL,MOTRIN) 200 MG tablet Take 200 mg by mouth every 6 (six) hours as needed for pain.      Marland Kitchen insulin glargine (LANTUS) 100 UNIT/ML injection Inject 32 Units into the skin at bedtime.      Marland Kitchen lisinopril (PRINIVIL,ZESTRIL) 10 MG tablet Take 1 tablet (10 mg total) by mouth daily.  30 tablet  0  . metFORMIN (GLUCOPHAGE) 1000 MG tablet TAKE ONE TABLET BY MOUTH TWICE DAILY  60 tablet  0  . omeprazole (PRILOSEC) 20 MG capsule Take 20 mg by mouth 2 (two)  times daily.      Marland Kitchen oxybutynin (DITROPAN) 5 MG tablet TAKE ONE TABLET BY MOUTH TWICE DAILY  60 tablet  0  . promethazine (PHENERGAN) 12.5 MG tablet Take 1 tablet (12.5 mg total) by mouth every 6 (six) hours as needed for nausea.  10 tablet  0  . [DISCONTINUED] oxybutynin (DITROPAN) 5 MG tablet Take 5 mg by mouth 2 (two) times daily.         No current facility-administered medications on file prior to visit.    Allergies:  Allergies  Allergen Reactions  . Sulfonamide Derivatives Hives    History   Social History  . Marital Status: Widowed    Spouse Name: N/A    Number of Children: 2  . Years of Education: N/A   Occupational History  . Not on file.   Social History Main Topics  . Smoking status: Never Smoker   . Smokeless tobacco:  Never Used  . Alcohol Use: No  . Drug Use: No  . Sexual Activity: Not on file   Other Topics Concern  . Not on file   Social History Narrative   Lives in [place] with daughter.   Retired    Family History  Problem Relation Age of Onset  . Cancer Mother     mouth cancer  . Breast cancer Mother     possibly dx in her 3s  . Lung cancer Brother 16  . Lung cancer Brother   . Lung cancer Brother   . Heart attack Father 75  . Brain cancer Brother      Review of Systems:  See HPI for pertinent ROS. All other ROS negative.    Physical Exam: Blood pressure 132/68, pulse 64, temperature 98.1 F (36.7 C), temperature source Oral, resp. rate 12, height 5\' 4"  (1.626 m), weight 195 lb (88.451 kg)., Body mass index is 33.46 kg/(m^2). General: Obese white female .Appears in no acute distress. Neck: Supple. No thyromegaly. No lymphadenopathy. No carotid bruits. Lungs: Clear bilaterally to auscultation without wheezes, rales, or rhonchi. Breathing is unlabored. Heart: RRR with S1 S2. No murmurs, rubs, or gallops. Abdomen: Soft, non-tender, non-distended with normoactive bowel sounds. No hepatomegaly. No rebound/guarding. No obvious abdominal masses. Musculoskeletal:  Strength and tone normal for age. Extremities/Skin: Warm and dry. No edema. Neuro: Alert and oriented X 3. Moves all extremities spontaneously. Gait is normal. CNII-XII grossly in tact. Psych:  Responds to questions appropriately with a normal affect. Diabetic foot exam: Inspection is normal with no wounds and no skin breakdown. Sensation is intact. Left dorsalis pedis pulses trace to 1+. Left posterior tibialis pulse is nonpalpable. Right dorsalis pedis pulse is 0 to trace. Right posterior tibial pulse is nonpalpable.  She does report that she does have a burning sensation on the bottoms of her feet quite frequently.      ASSESSMENT AND PLAN:  68 y.o. year old female with  1. DIABETES MELLITUS, TYPE II - COMPLETE  METABOLIC PANEL WITH GFR - Hemoglobin A1c  Microalbumin 09/07/12  On statin. On ACE inhibitor. Reports that she has had no diabetic eye exam in > 2 years. I discussed the need for annual diabetic eye exam and write this down on her AVS as "homework"  Pneumonia vaccine: She received pneumovax 23  In  2001 and again  12/26/12. She will need a Prevnar 13 but it must be a least 12 months after her Pneumovax 23. Will give this after 12/26/13.  2. Diabetic neuropathy Patient does report that  she has burning on the bottoms of her feet. Discussed it is quite bothersome and does want to start medication for this. To start the medication, she is instructed to take one at night on day #1. On day 2 she can take 1 pill twice daily. Then she can increase up to 1 pill 3 times daily. - gabapentin (NEURONTIN) 300 MG capsule; Take 1 capsule (300 mg total) by mouth 3 (three) times daily.  Dispense: 90 capsule; Refill: 3  3. HYPERTENSION, UNSPECIFIED Blood pressure at goal at 132/68.  She is on ACE inhibitor. - COMPLETE METABOLIC PANEL WITH GFR  4. HYPERLIPIDEMIA She is on statin. She is on Lipitor 40 as well as Zetia 10mg . - COMPLETE METABOLIC PANEL WITH GFR - Lipid panel  5. CAD, UNSPECIFIED SITE She had a cardiology preop evaluation July 2014 which was stable. Her cardiac issues since then.  6. DCIS (ductal carcinoma in situ) of breast S/p left mastectomy 12/2012. On oral chemotherapy.  7. DEPRESSION/ANXIETY Controlled with current medication. Continue Prozac 40 mg.  8. GERD  9. OBESITY Have Discussed need for proper diet and exercise multiple occasions.  10. Vitamin D deficiency - Vit D  25 hydroxy (rtn osteoporosis monitoring)   Signed, [address] [place], [place], [facility] 07/24/2013 1:31 PM

## 2013-07-25 LAB — VITAMIN D 25 HYDROXY (VIT D DEFICIENCY, FRACTURES): VIT D 25 HYDROXY: 35 ng/mL (ref 30–89)

## 2013-07-25 LAB — COMPLETE METABOLIC PANEL WITH GFR
ALBUMIN: 4.4 g/dL (ref 3.5–5.2)
ALT: 33 U/L (ref 0–35)
AST: 18 U/L (ref 0–37)
Alkaline Phosphatase: 69 U/L (ref 39–117)
BILIRUBIN TOTAL: 0.5 mg/dL (ref 0.2–1.2)
BUN: 18 mg/dL (ref 6–23)
CO2: 29 meq/L (ref 19–32)
Calcium: 10.2 mg/dL (ref 8.4–10.5)
Chloride: 100 mEq/L (ref 96–112)
Creat: 0.88 mg/dL (ref 0.50–1.10)
GFR, EST AFRICAN AMERICAN: 78 mL/min
GFR, Est Non African American: 68 mL/min
Glucose, Bld: 171 mg/dL — ABNORMAL HIGH (ref 70–99)
POTASSIUM: 5.4 meq/L — AB (ref 3.5–5.3)
SODIUM: 139 meq/L (ref 135–145)
TOTAL PROTEIN: 7.7 g/dL (ref 6.0–8.3)

## 2013-07-25 LAB — LIPID PANEL
Cholesterol: 145 mg/dL (ref 0–200)
HDL: 37 mg/dL — AB (ref >39)
LDL Cholesterol: 72 mg/dL (ref 0–99)
Total CHOL/HDL Ratio: 3.9 Ratio
Triglycerides: 180 mg/dL — ABNORMAL HIGH (ref <150)
VLDL: 36 mg/dL (ref 0–40)

## 2013-07-25 LAB — HEMOGLOBIN A1C
Hgb A1c MFr Bld: 8.5 % — ABNORMAL HIGH (ref <5.7)
Mean Plasma Glucose: 197 mg/dL — ABNORMAL HIGH (ref <117)

## 2013-07-26 ENCOUNTER — Other Ambulatory Visit: Payer: Self-pay | Admitting: Family Medicine

## 2013-07-26 MED ORDER — OXYBUTYNIN CHLORIDE 5 MG PO TABS: 5.0000 mg | ORAL_TABLET | Freq: Two times a day (BID) | ORAL | Status: DC

## 2013-07-26 MED ORDER — ATORVASTATIN CALCIUM 40 MG PO TABS: 40.0000 mg | ORAL_TABLET | Freq: Every day | ORAL | Status: DC

## 2013-07-26 MED ORDER — METFORMIN HCL 1000 MG PO TABS: 1000.0000 mg | ORAL_TABLET | Freq: Two times a day (BID) | ORAL | Status: DC

## 2013-07-26 MED ORDER — LISINOPRIL 10 MG PO TABS: 10.0000 mg | ORAL_TABLET | Freq: Every day | ORAL | Status: DC

## 2013-07-26 MED ORDER — INSULIN GLARGINE 100 UNITS/ML SOLOSTAR PEN: 32.0000 [IU] | PEN_INJECTOR | Freq: Every day | SUBCUTANEOUS | Status: DC

## 2013-07-26 MED ORDER — FLUOXETINE HCL 40 MG PO CAPS: 40.0000 mg | ORAL_CAPSULE | Freq: Every day | ORAL | Status: DC

## 2013-07-26 MED ORDER — EZETIMIBE 10 MG PO TABS: 10.0000 mg | ORAL_TABLET | Freq: Every day | ORAL | Status: DC

## 2013-07-26 MED ORDER — OMEPRAZOLE 20 MG PO CPDR: 20.0000 mg | DELAYED_RELEASE_CAPSULE | Freq: Two times a day (BID) | ORAL | Status: DC

## 2013-07-26 NOTE — Telephone Encounter (Signed)
Medication refilled per protocol. 

## 2013-08-26 ENCOUNTER — Ambulatory Visit: Payer: Self-pay | Admitting: Physician Assistant

## 2013-09-11 ENCOUNTER — Telehealth: Payer: Self-pay | Admitting: Family Medicine

## 2013-09-11 DIAGNOSIS — E1165 Type 2 diabetes mellitus with hyperglycemia: Principal | ICD-10-CM

## 2013-09-11 DIAGNOSIS — IMO0001 Reserved for inherently not codable concepts without codable children: Secondary | ICD-10-CM

## 2013-09-11 MED ORDER — INSULIN GLARGINE 100 UNITS/ML SOLOSTAR PEN: 32.0000 [IU] | PEN_INJECTOR | Freq: Every day | SUBCUTANEOUS | Status: DC

## 2013-09-11 NOTE — Telephone Encounter (Signed)
Medication refilled per protocol. 

## 2013-10-16 ENCOUNTER — Other Ambulatory Visit: Payer: Self-pay | Admitting: Family Medicine

## 2013-10-16 ENCOUNTER — Telehealth: Payer: Self-pay

## 2013-10-16 MED ORDER — LISINOPRIL 10 MG PO TABS: 10.0000 mg | ORAL_TABLET | Freq: Every day | ORAL | Status: DC

## 2013-10-16 MED ORDER — METFORMIN HCL 1000 MG PO TABS: 1000.0000 mg | ORAL_TABLET | Freq: Two times a day (BID) | ORAL | Status: DC

## 2013-10-16 MED ORDER — ATORVASTATIN CALCIUM 40 MG PO TABS: 40.0000 mg | ORAL_TABLET | Freq: Every day | ORAL | Status: DC

## 2013-10-16 NOTE — Telephone Encounter (Signed)
Let pt daughter know KK out on LOA.  Reschedule 5/15 appt to 6/8 lab at 130, MD at 2.  Daughter voiced understanding.

## 2013-10-21 ENCOUNTER — Ambulatory Visit (INDEPENDENT_AMBULATORY_CARE_PROVIDER_SITE_OTHER): Payer: Medicare Other | Admitting: Family Medicine

## 2013-10-21 ENCOUNTER — Encounter: Payer: Self-pay | Admitting: Family Medicine

## 2013-10-21 VITALS — BP 136/72 | HR 64 | Temp 98.0°F | Resp 16 | Ht 64.0 in | Wt 201.0 lb

## 2013-10-21 DIAGNOSIS — L570 Actinic keratosis: Secondary | ICD-10-CM | POA: Diagnosis not present

## 2013-10-21 DIAGNOSIS — I2510 Atherosclerotic heart disease of native coronary artery without angina pectoris: Secondary | ICD-10-CM

## 2013-10-21 DIAGNOSIS — E669 Obesity, unspecified: Secondary | ICD-10-CM

## 2013-10-21 DIAGNOSIS — I10 Essential (primary) hypertension: Secondary | ICD-10-CM

## 2013-10-21 DIAGNOSIS — E119 Type 2 diabetes mellitus without complications: Secondary | ICD-10-CM | POA: Diagnosis not present

## 2013-10-21 DIAGNOSIS — E785 Hyperlipidemia, unspecified: Secondary | ICD-10-CM | POA: Diagnosis not present

## 2013-10-21 LAB — COMPREHENSIVE METABOLIC PANEL
ALK PHOS: 81 U/L (ref 39–117)
ALT: 26 U/L (ref 0–35)
AST: 16 U/L (ref 0–37)
Albumin: 4.2 g/dL (ref 3.5–5.2)
BILIRUBIN TOTAL: 0.5 mg/dL (ref 0.2–1.2)
BUN: 24 mg/dL — ABNORMAL HIGH (ref 6–23)
CO2: 25 mEq/L (ref 19–32)
Calcium: 9.2 mg/dL (ref 8.4–10.5)
Chloride: 105 mEq/L (ref 96–112)
Creat: 1.03 mg/dL (ref 0.50–1.10)
Glucose, Bld: 164 mg/dL — ABNORMAL HIGH (ref 70–99)
POTASSIUM: 5.5 meq/L — AB (ref 3.5–5.3)
SODIUM: 141 meq/L (ref 135–145)
Total Protein: 7.1 g/dL (ref 6.0–8.3)

## 2013-10-21 LAB — CBC WITH DIFFERENTIAL/PLATELET
Basophils Absolute: 0.1 10*3/uL (ref 0.0–0.1)
Basophils Relative: 1 % (ref 0–1)
Eosinophils Absolute: 0.8 10*3/uL — ABNORMAL HIGH (ref 0.0–0.7)
Eosinophils Relative: 11 % — ABNORMAL HIGH (ref 0–5)
HEMATOCRIT: 36.6 % (ref 36.0–46.0)
Hemoglobin: 12.2 g/dL (ref 12.0–15.0)
LYMPHS ABS: 2.3 10*3/uL (ref 0.7–4.0)
LYMPHS PCT: 30 % (ref 12–46)
MCH: 29 pg (ref 26.0–34.0)
MCHC: 33.3 g/dL (ref 30.0–36.0)
MCV: 86.9 fL (ref 78.0–100.0)
MONO ABS: 0.6 10*3/uL (ref 0.1–1.0)
MONOS PCT: 8 % (ref 3–12)
NEUTROS ABS: 3.8 10*3/uL (ref 1.7–7.7)
Neutrophils Relative %: 50 % (ref 43–77)
Platelets: 234 10*3/uL (ref 150–400)
RBC: 4.21 MIL/uL (ref 3.87–5.11)
RDW: 15.9 % — ABNORMAL HIGH (ref 11.5–15.5)
WBC: 7.6 10*3/uL (ref 4.0–10.5)

## 2013-10-21 LAB — LIPID PANEL
CHOLESTEROL: 108 mg/dL (ref 0–200)
HDL: 32 mg/dL — AB (ref >39)
LDL Cholesterol: 49 mg/dL (ref 0–99)
Total CHOL/HDL Ratio: 3.4 Ratio
Triglycerides: 137 mg/dL (ref <150)
VLDL: 27 mg/dL (ref 0–40)

## 2013-10-21 LAB — HEMOGLOBIN A1C
HEMOGLOBIN A1C: 9.2 % — AB (ref <5.7)
MEAN PLASMA GLUCOSE: 217 mg/dL — AB (ref <117)

## 2013-10-21 NOTE — Assessment & Plan Note (Signed)
Blood pressure is

## 2013-10-21 NOTE — Assessment & Plan Note (Signed)
She has multiple lesions all over her skin. The one on her hand looks more like an actinic keratoses no sign of infection. I done cryotherapy on this today to see if this helps. Advised to wear sunscreen as well. If the lesion does not improve we may need dermatology or bring her back in for biopsy

## 2013-10-21 NOTE — Assessment & Plan Note (Signed)
A1c is uncontrolled recheck A1c for now continue her at Lantus 38 units until I see her labs we'll also continue the metformin she is on a statin drug as well as an ACE inhibitor

## 2013-10-21 NOTE — Assessment & Plan Note (Signed)
Discussed weight gain and dietary changes that need to be made  She now lives by herself there has been snacking a lot

## 2013-10-21 NOTE — Assessment & Plan Note (Signed)
Recheck lipid, triglycerides are still elevated despite zetia and lipitor

## 2013-10-21 NOTE — Patient Instructions (Signed)
Continue current medications We will call with lab results and medication changes Wear sunscreen  F/U 3 months- bring your [facility]

## 2013-10-21 NOTE — Progress Notes (Signed)
Patient ID: Kristen [name], female   DOB: [birth year], 68 y.o.   MRN: [id number]   Subjective:    Patient ID: Kristen [name], female    DOB: February 05, 1946, 68 y.o.   MRN: [id number]  Patient presents for 3 month F/U  patient here follow chronic medical problems. She's history diabetes mellitus which is uncontrolled her last A1c was 8.5% she's taking Lantus 38 units at bedtime as well as metformin 1000 mg twice a day. She did not bring her meter today for states her fasting blood sugar this morning was 198 he typically ranges in the 150s. She's not had any hypoglycemia. No polyuria polydipsia.  Breast cancer she is status post left mastectomy she's currently under therapy by oncology.  Her medications were reviewed she has no specific concerns. She's doing well without any side effects.  She is a spot that came up on her right hand a week or so ago it itches but is fairly scaly. Denies any pain at the spot. She has multiple other spots on her arms as well. She has been using her triple antibiotic ointment on it however there's been no change.    Review Of Systems:  GEN- denies fatigue, fever, weight loss,weakness, recent illness HEENT- denies eye drainage, change in vision, nasal discharge, CVS- denies chest pain, palpitations RESP- denies SOB, cough, wheeze ABD- denies N/V, change in stools, abd pain GU- denies dysuria, hematuria, dribbling, incontinence MSK- denies joint pain, muscle aches, injury Neuro- denies headache, dizziness, syncope, seizure activity       Objective:    BP 136/72  Pulse 64  Temp(Src) 98 F (36.7 C) (Oral)  Resp 16  Ht 5\' 4"  (1.626 m)  Wt 201 lb (91.173 kg)  BMI 34.48 kg/m2 GEN- NAD, alert and oriented x3 HEENT- PERRL, EOMI, non injected sclera, pink conjunctiva, MMM, oropharynx clear Neck- Supple, no thyromegaly CVS- RRR, no murmur RESP-CTAB Skin- multiple sun spots on arm, multiple moles, seborrheic keratosis scattered on back, right hand- below knuckles-  scaly erythematous macule EXT- No edema Pulses- Radial 2+  Cryotherapy x 2 passes at AK on right hand- tolerated procedure well, bandage applied        Assessment & Plan:      Problem List Items Addressed This Visit   Type II or unspecified type diabetes mellitus with neurological manifestations, not stated as uncontrolled - Primary   OBESITY   HYPERTENSION, UNSPECIFIED   HYPERLIPIDEMIA   Relevant Orders      Lipid panel   AK (actinic keratosis)      Note: This dictation was prepared with Dragon dictation along with smaller phrase technology. Any transcriptional errors that result from this process are unintentional.

## 2013-10-25 ENCOUNTER — Other Ambulatory Visit: Payer: Medicare Other

## 2013-10-25 ENCOUNTER — Ambulatory Visit: Payer: Medicare Other | Admitting: Oncology

## 2013-10-29 ENCOUNTER — Telehealth: Payer: Self-pay | Admitting: Physician Assistant

## 2013-10-29 DIAGNOSIS — IMO0001 Reserved for inherently not codable concepts without codable children: Secondary | ICD-10-CM

## 2013-10-29 DIAGNOSIS — E1165 Type 2 diabetes mellitus with hyperglycemia: Principal | ICD-10-CM

## 2013-10-29 MED ORDER — INSULIN GLARGINE 100 UNITS/ML SOLOSTAR PEN: 40.0000 [IU] | PEN_INJECTOR | Freq: Every day | SUBCUTANEOUS | Status: DC

## 2013-10-29 NOTE — Telephone Encounter (Signed)
[phone] Patient says pharmacy needs authorization because we changed dosage of her medication

## 2013-10-29 NOTE — Telephone Encounter (Signed)
Insulin increased to 40 units, pt needs new rx to pharmacy.  rx sent

## 2013-11-07 ENCOUNTER — Telehealth: Payer: Self-pay | Admitting: Family Medicine

## 2013-11-07 DIAGNOSIS — E1140 Type 2 diabetes mellitus with diabetic neuropathy, unspecified: Secondary | ICD-10-CM

## 2013-11-07 MED ORDER — GABAPENTIN 300 MG PO CAPS: 300.0000 mg | ORAL_CAPSULE | Freq: Three times a day (TID) | ORAL | Status: DC

## 2013-11-07 NOTE — Telephone Encounter (Signed)
Pt has been discharged from practice.  Only one month refill sent.

## 2013-11-14 ENCOUNTER — Telehealth: Payer: Self-pay | Admitting: Oncology

## 2013-11-14 NOTE — Telephone Encounter (Signed)
, °

## 2013-11-18 ENCOUNTER — Other Ambulatory Visit: Payer: Medicare Other

## 2013-11-18 ENCOUNTER — Ambulatory Visit: Payer: Medicare Other

## 2013-12-02 ENCOUNTER — Telehealth: Payer: Self-pay | Admitting: *Deleted

## 2013-12-02 NOTE — Telephone Encounter (Signed)
Spoke with daughter to schedule patient with her new Sohail Capraro Dr. Lindi Adie. Confirmed 01/31/14 1030 labs and 11am with Dr. Lindi Adie.

## 2013-12-20 ENCOUNTER — Other Ambulatory Visit: Payer: Self-pay | Admitting: *Deleted

## 2013-12-20 DIAGNOSIS — E0841 Diabetes mellitus due to underlying condition with diabetic mononeuropathy: Secondary | ICD-10-CM

## 2013-12-20 MED ORDER — GABAPENTIN 300 MG PO CAPS: 300.0000 mg | ORAL_CAPSULE | Freq: Three times a day (TID) | ORAL | Status: DC

## 2013-12-20 NOTE — Telephone Encounter (Signed)
Medication filled x1 with no refills.   No further refills will be given.

## 2014-01-13 ENCOUNTER — Telehealth: Payer: Self-pay | Admitting: Family Medicine

## 2014-01-13 DIAGNOSIS — IMO0001 Reserved for inherently not codable concepts without codable children: Secondary | ICD-10-CM

## 2014-01-13 DIAGNOSIS — E0841 Diabetes mellitus due to underlying condition with diabetic mononeuropathy: Secondary | ICD-10-CM

## 2014-01-13 DIAGNOSIS — E1165 Type 2 diabetes mellitus with hyperglycemia: Secondary | ICD-10-CM

## 2014-01-13 MED ORDER — ATORVASTATIN CALCIUM 40 MG PO TABS: 40.0000 mg | ORAL_TABLET | Freq: Every day | ORAL | Status: DC

## 2014-01-13 MED ORDER — FLUOXETINE HCL 40 MG PO CAPS: 40.0000 mg | ORAL_CAPSULE | Freq: Every day | ORAL | Status: DC

## 2014-01-13 MED ORDER — EZETIMIBE 10 MG PO TABS: 10.0000 mg | ORAL_TABLET | Freq: Every day | ORAL | Status: DC

## 2014-01-13 MED ORDER — OXYBUTYNIN CHLORIDE 5 MG PO TABS: 5.0000 mg | ORAL_TABLET | Freq: Two times a day (BID) | ORAL | Status: DC

## 2014-01-13 MED ORDER — INSULIN GLARGINE 100 UNITS/ML SOLOSTAR PEN: 40.0000 [IU] | PEN_INJECTOR | Freq: Every day | SUBCUTANEOUS | Status: DC

## 2014-01-13 MED ORDER — GABAPENTIN 300 MG PO CAPS: 300.0000 mg | ORAL_CAPSULE | Freq: Three times a day (TID) | ORAL | Status: DC

## 2014-01-13 MED ORDER — OMEPRAZOLE 20 MG PO CPDR: 20.0000 mg | DELAYED_RELEASE_CAPSULE | Freq: Two times a day (BID) | ORAL | Status: DC

## 2014-01-13 MED ORDER — METFORMIN HCL 1000 MG PO TABS: 1000.0000 mg | ORAL_TABLET | Freq: Two times a day (BID) | ORAL | Status: DC

## 2014-01-13 MED ORDER — LISINOPRIL 10 MG PO TABS: 10.0000 mg | ORAL_TABLET | Freq: Every day | ORAL | Status: DC

## 2014-01-13 MED ORDER — EXEMESTANE 25 MG PO TABS: 25.0000 mg | ORAL_TABLET | Freq: Every day | ORAL | Status: DC

## 2014-01-13 NOTE — Telephone Encounter (Signed)
Patient is calling to get refill on all of her medications if possible except for the asprin she says  They all [facility]  Phone number is [phone] And fax is [phone]

## 2014-01-13 NOTE — Telephone Encounter (Signed)
Prescription sent to pharmacy.

## 2014-01-22 ENCOUNTER — Telehealth: Payer: Self-pay | Admitting: Hematology and Oncology

## 2014-01-22 NOTE — Telephone Encounter (Signed)
pt cld to get next appt time & date-gave pt time & date-pt understood

## 2014-01-24 ENCOUNTER — Ambulatory Visit: Payer: Medicare Other | Admitting: Family Medicine

## 2014-01-30 ENCOUNTER — Other Ambulatory Visit: Payer: Self-pay

## 2014-01-30 DIAGNOSIS — D0510 Intraductal carcinoma in situ of unspecified breast: Secondary | ICD-10-CM

## 2014-01-31 ENCOUNTER — Telehealth: Payer: Self-pay | Admitting: Hematology and Oncology

## 2014-01-31 ENCOUNTER — Ambulatory Visit (HOSPITAL_BASED_OUTPATIENT_CLINIC_OR_DEPARTMENT_OTHER): Payer: Medicare Other | Admitting: Hematology and Oncology

## 2014-01-31 ENCOUNTER — Other Ambulatory Visit (HOSPITAL_BASED_OUTPATIENT_CLINIC_OR_DEPARTMENT_OTHER): Payer: Medicare Other

## 2014-01-31 ENCOUNTER — Encounter: Payer: Self-pay | Admitting: Hematology and Oncology

## 2014-01-31 VITALS — BP 122/66 | HR 79 | Temp 98.2°F | Resp 18 | Ht 64.0 in | Wt 198.7 lb

## 2014-01-31 DIAGNOSIS — C50419 Malignant neoplasm of upper-outer quadrant of unspecified female breast: Secondary | ICD-10-CM

## 2014-01-31 DIAGNOSIS — D0510 Intraductal carcinoma in situ of unspecified breast: Secondary | ICD-10-CM

## 2014-01-31 DIAGNOSIS — Z170 Estrogen receptor positive status [ER+]: Secondary | ICD-10-CM

## 2014-01-31 DIAGNOSIS — C50412 Malignant neoplasm of upper-outer quadrant of left female breast: Secondary | ICD-10-CM

## 2014-01-31 LAB — CBC WITH DIFFERENTIAL/PLATELET
BASO%: 0.6 % (ref 0.0–2.0)
Basophils Absolute: 0 10*3/uL (ref 0.0–0.1)
EOS%: 3.6 % (ref 0.0–7.0)
Eosinophils Absolute: 0.3 10*3/uL (ref 0.0–0.5)
HEMATOCRIT: 39.8 % (ref 34.8–46.6)
HGB: 12.6 g/dL (ref 11.6–15.9)
LYMPH#: 2.1 10*3/uL (ref 0.9–3.3)
LYMPH%: 26 % (ref 14.0–49.7)
MCH: 28 pg (ref 25.1–34.0)
MCHC: 31.6 g/dL (ref 31.5–36.0)
MCV: 88.6 fL (ref 79.5–101.0)
MONO#: 0.9 10*3/uL (ref 0.1–0.9)
MONO%: 10.6 % (ref 0.0–14.0)
NEUT%: 59.2 % (ref 38.4–76.8)
NEUTROS ABS: 4.8 10*3/uL (ref 1.5–6.5)
Platelets: 266 10*3/uL (ref 145–400)
RBC: 4.49 10*6/uL (ref 3.70–5.45)
RDW: 15.1 % — AB (ref 11.2–14.5)
WBC: 8.1 10*3/uL (ref 3.9–10.3)

## 2014-01-31 LAB — COMPREHENSIVE METABOLIC PANEL (CC13)
ALT: 28 U/L (ref 0–55)
ANION GAP: 12 meq/L — AB (ref 3–11)
AST: 18 U/L (ref 5–34)
Albumin: 3.8 g/dL (ref 3.5–5.0)
Alkaline Phosphatase: 84 U/L (ref 40–150)
BUN: 24.3 mg/dL (ref 7.0–26.0)
CALCIUM: 9.5 mg/dL (ref 8.4–10.4)
CHLORIDE: 104 meq/L (ref 98–109)
CO2: 25 meq/L (ref 22–29)
Creatinine: 1.2 mg/dL — ABNORMAL HIGH (ref 0.6–1.1)
Glucose: 215 mg/dl — ABNORMAL HIGH (ref 70–140)
POTASSIUM: 5.1 meq/L (ref 3.5–5.1)
Sodium: 140 mEq/L (ref 136–145)
Total Bilirubin: 0.46 mg/dL (ref 0.20–1.20)
Total Protein: 7.3 g/dL (ref 6.4–8.3)

## 2014-01-31 NOTE — Progress Notes (Signed)
Patient has no care team.  DIAGNOSIS: Malignant neoplasm of upper-outer quadrant of left female breast   Primary site: Breast   Staging method: AJCC 7th Edition   Pathologic: Stage IA (T1a, N0, cM0) signed by Rulon Eisenmenger, MD on 01/31/2014 11:17 AM   Summary: Stage IA (T1a, N0, cM0)   SUMMARY OF ONCOLOGIC HISTORY:   Malignant neoplasm of upper-outer quadrant of left female breast   11/30/2012 Initial Diagnosis Malignant neoplasm of upper-outer quadrant of left female breast: Left breast bloody nipple discharge that led to mammogram. Initial biopsy revealed DCIS ER/PR positive   12/24/2012 Surgery Left mastectomy with sentinel lymph node biopsy. Invasive ductal carcinoma grade 2 with 2 foci each measuring less than 0.1 cm with high-grade DCIS 2.6 cm and 1.4 cm 6 sentinel nodes negative ER 100% PR 100% HER-2 negative Ki-67 14%   01/31/2013 -  Anti-estrogen oral therapy Aromasin 25 mg once daily    CHIEF COMPLIANT:  Six-month followup of history of breast cancer INTERVAL HISTORY:  Ms.Kristen [name] is a 68 year old Caucasian lady with the above-mentioned history of left breast cancer treated with mastectomy and is currently on adjuvant antiestrogen therapy with Aromasin. Patient is tolerating his treatment fairly well without any major problems. She did not undergo the mammogram testing is recommended because of family issues. Denies any other new complaints today.  REVIEW OF SYSTEMS:   Constitutional: Denies fevers, chills or abnormal weight loss Eyes: Denies blurriness of vision Ears, nose, mouth, throat, and face: Denies mucositis or sore throat Respiratory: Denies cough, dyspnea or wheezes Cardiovascular: Denies palpitation, chest discomfort or lower extremity swelling Gastrointestinal:  Denies nausea, heartburn or change in bowel habits Skin: Denies abnormal skin rashes Lymphatics: Denies new lymphadenopathy or easy bruising Neurological:Denies numbness, tingling or new  weaknesses Behavioral/Psych: Mood is stable, no new changes  Breast: Denies any lumps or nodules All other systems were reviewed with the patient and are negative.  I have reviewed the past medical history, past surgical history, social history and family history with the patient and they are unchanged from previous note.  ALLERGIES:  is allergic to sulfonamide derivatives.  MEDICATIONS:  Current Outpatient Prescriptions  Medication Sig Dispense Refill  . aspirin EC 81 MG tablet Take 81 mg by mouth daily.      Marland Kitchen atorvastatin (LIPITOR) 40 MG tablet Take 1 tablet (40 mg total) by mouth daily at 6 PM.  90 tablet  2  . Docusate Calcium (STOOL SOFTENER PO) Take 1 capsule by mouth 2 (two) times daily.       Marland Kitchen exemestane (AROMASIN) 25 MG tablet Take 1 tablet (25 mg total) by mouth daily after breakfast.  90 tablet  2  . ezetimibe (ZETIA) 10 MG tablet Take 1 tablet (10 mg total) by mouth daily.  90 tablet  2  . FLUoxetine (PROZAC) 40 MG capsule Take 1 capsule (40 mg total) by mouth daily.  90 capsule  2  . gabapentin (NEURONTIN) 300 MG capsule Take 1 capsule (300 mg total) by mouth 3 (three) times daily.  90 capsule  2  . ibuprofen (ADVIL,MOTRIN) 200 MG tablet Take 200 mg by mouth every 6 (six) hours as needed for pain.      Marland Kitchen insulin glargine (LANTUS) 100 unit/mL SOPN Inject 0.4 mLs (40 Units total) into the skin at bedtime.  45 mL  2  . lisinopril (PRINIVIL,ZESTRIL) 10 MG tablet Take 1 tablet (10 mg total) by mouth daily.  90 tablet  2  . metFORMIN (GLUCOPHAGE) 1000 MG  tablet Take 1 tablet (1,000 mg total) by mouth 2 (two) times daily with a meal.  180 tablet  2  . omeprazole (PRILOSEC) 20 MG capsule Take 1 capsule (20 mg total) by mouth 2 (two) times daily.  180 capsule  2  . oxybutynin (DITROPAN) 5 MG tablet Take 1 tablet (5 mg total) by mouth 2 (two) times daily.  180 tablet  2  . promethazine (PHENERGAN) 12.5 MG tablet Take 1 tablet (12.5 mg total) by mouth every 6 (six) hours as needed for  nausea.  10 tablet  0  . [DISCONTINUED] oxybutynin (DITROPAN) 5 MG tablet Take 5 mg by mouth 2 (two) times daily.         No current facility-administered medications for this visit.    PHYSICAL EXAMINATION: ECOG PERFORMANCE STATUS: 1 - Symptomatic but completely ambulatory  Filed Vitals:   01/31/14 1055  BP: 122/66  Pulse: 79  Temp: 98.2 F (36.8 C)  Resp: 18   Filed Weights   01/31/14 1055  Weight: 198 lb 11.2 oz (90.13 kg)    GENERAL:alert, no distress and comfortable SKIN: skin color, texture, turgor are normal, no rashes or significant lesions EYES: normal, Conjunctiva are pink and non-injected, sclera clear OROPHARYNX:no exudate, no erythema and lips, buccal mucosa, and tongue normal  NECK: supple, thyroid normal size, non-tender, without nodularity LYMPH:  no palpable lymphadenopathy in the cervical, axillary or inguinal LUNGS: clear to auscultation and percussion with normal breathing effort HEART: regular rate & rhythm and no murmurs and no lower extremity edema ABDOMEN:abdomen soft, non-tender and normal bowel sounds Musculoskeletal:no cyanosis of digits and no clubbing  NEURO: alert & oriented x 3 with fluent speech, no focal motor/sensory deficits BREAST: No palpable masses lungs or nodules in either right breasts. No palpable axillary supraclavicular or infraclavicular adenopathy no breast tenderness or nipple discharge. Left breast scar tissue does not have any nodules. No axillary lymphadenopathy   LABORATORY DATA:  I have reviewed the data as listed Appointment on 01/31/2014  Component Date Value Ref Range Status  . WBC 01/31/2014 8.1  3.9 - 10.3 10e3/uL Final  . NEUT# 01/31/2014 4.8  1.5 - 6.5 10e3/uL Final  . HGB 01/31/2014 12.6  11.6 - 15.9 g/dL Final  . HCT 01/31/2014 39.8  34.8 - 46.6 % Final  . Platelets 01/31/2014 266  145 - 400 10e3/uL Final  . MCV 01/31/2014 88.6  79.5 - 101.0 fL Final  . MCH 01/31/2014 28.0  25.1 - 34.0 pg Final  . MCHC  01/31/2014 31.6  31.5 - 36.0 g/dL Final  . RBC 01/31/2014 4.49  3.70 - 5.45 10e6/uL Final  . RDW 01/31/2014 15.1* 11.2 - 14.5 % Final  . lymph# 01/31/2014 2.1  0.9 - 3.3 10e3/uL Final  . MONO# 01/31/2014 0.9  0.1 - 0.9 10e3/uL Final  . Eosinophils Absolute 01/31/2014 0.3  0.0 - 0.5 10e3/uL Final  . Basophils Absolute 01/31/2014 0.0  0.0 - 0.1 10e3/uL Final  . NEUT% 01/31/2014 59.2  38.4 - 76.8 % Final  . LYMPH% 01/31/2014 26.0  14.0 - 49.7 % Final  . MONO% 01/31/2014 10.6  0.0 - 14.0 % Final  . EOS% 01/31/2014 3.6  0.0 - 7.0 % Final  . BASO% 01/31/2014 0.6  0.0 - 2.0 % Final  . Sodium 01/31/2014 140  136 - 145 mEq/L Final  . Potassium 01/31/2014 5.1  3.5 - 5.1 mEq/L Final  . Chloride 01/31/2014 104  98 - 109 mEq/L Final  . CO2 01/31/2014 25  22 - 29 mEq/L Final  . Glucose 01/31/2014 215* 70 - 140 mg/dl Final  . BUN 01/31/2014 24.3  7.0 - 26.0 mg/dL Final  . Creatinine 01/31/2014 1.2* 0.6 - 1.1 mg/dL Final  . Total Bilirubin 01/31/2014 0.46  0.20 - 1.20 mg/dL Final  . Alkaline Phosphatase 01/31/2014 84  40 - 150 U/L Final  . AST 01/31/2014 18  5 - 34 U/L Final  . ALT 01/31/2014 28  0 - 55 U/L Final  . Total Protein 01/31/2014 7.3  6.4 - 8.3 g/dL Final  . Albumin 01/31/2014 3.8  3.5 - 5.0 g/dL Final  . Calcium 01/31/2014 9.5  8.4 - 10.4 mg/dL Final  . Anion Gap 01/31/2014 12* 3 - 11 mEq/L Final    RADIOGRAPHIC STUDIES: I have personally reviewed the radiology reports and agreed with their findings. No results found.   DISEASE STAGE: Malignant neoplasm of upper-outer quadrant of left female breast   Primary site: Breast   Staging method: AJCC 7th Edition   Pathologic: Stage IA (T1a, N0, cM0) signed by Rulon Eisenmenger, MD on 01/31/2014 11:17 AM   Summary: Stage IA (T1a, N0, cM0) ASSESSMENT & PLAN:  Malignant neoplasm of upper-outer quadrant of left female breast Left breast invasive ductal carcinoma with DCIS T1, N0, M0 stage IA ER/PR positive HER-2 negative status post right  mastectomy currently on adjuvant hormonal therapy with Aromasin for the past one year. She is tolerating it very well without any major problems or concerns. Denies any hot flashes achiness denies any vaginal bleeding or muscle aches or pains.  Surveillance: Patient missed her appointment for mammogram. I will reschedule her for a mammogram in the next one to 2 weeks. Today's physical exam did not reveal any lumps or nodules in her left breast. We will call her with the results of this test. If that is normal she will be seen in 6 months for followup. No indication to do routine blood testing unless she is symptomatic.   Orders Placed This Encounter  Procedures  . MM Digital Diagnostic Unilat L    Standing Status: Future     Number of Occurrences:      Standing Expiration Date: 01/31/2015    Order Specific Question:  Reason for Exam (SYMPTOM  OR DIAGNOSIS REQUIRED)    Answer:  mastectomy right breast for breast cancer    Order Specific Question:  Preferred imaging location?    Answer:  [facility]   The patient has a good understanding of the overall plan. she agrees with it. She will call with any problems that may develop before her next visit here.  I spent 25 minutes counseling the patient face to face. The total time spent in the appointment was 30 minutes and more than 50% was on counseling and review of test results    Rulon Eisenmenger, MD 01/31/2014 11:19 AM

## 2014-01-31 NOTE — Assessment & Plan Note (Signed)
Left breast invasive ductal carcinoma with DCIS T1, N0, M0 stage IA ER/PR positive HER-2 negative status post right mastectomy currently on adjuvant hormonal therapy with Aromasin for the past one year. She is tolerating it very well without any major problems or concerns. Denies any hot flashes achiness denies any vaginal bleeding or muscle aches or pains.  Surveillance: Patient missed her appointment for mammogram. I will reschedule her for a mammogram in the next one to 2 weeks. Today's physical exam did not reveal any lumps or nodules in her left breast. We will call her with the results of this test. If that is normal she will be seen in 6 months for followup. No indication to do routine blood testing unless she is symptomatic.

## 2014-01-31 NOTE — Telephone Encounter (Signed)
, °

## 2014-02-06 ENCOUNTER — Other Ambulatory Visit: Payer: Self-pay | Admitting: Hematology and Oncology

## 2014-02-06 DIAGNOSIS — Z1231 Encounter for screening mammogram for malignant neoplasm of breast: Secondary | ICD-10-CM

## 2014-02-24 ENCOUNTER — Ambulatory Visit: Payer: Medicare Other

## 2014-03-03 ENCOUNTER — Other Ambulatory Visit: Payer: Self-pay | Admitting: Hematology and Oncology

## 2014-03-03 ENCOUNTER — Ambulatory Visit
Admission: RE | Admit: 2014-03-03 | Discharge: 2014-03-03 | Disposition: A | Discharge status: Home / Self Care | Payer: Medicare Other | Source: Ambulatory Visit | Attending: Hematology and Oncology | Admitting: Hematology and Oncology

## 2014-03-03 ENCOUNTER — Encounter (INDEPENDENT_AMBULATORY_CARE_PROVIDER_SITE_OTHER): Payer: Self-pay

## 2014-03-03 DIAGNOSIS — Z9012 Acquired absence of left breast and nipple: Secondary | ICD-10-CM

## 2014-03-03 DIAGNOSIS — Z1231 Encounter for screening mammogram for malignant neoplasm of breast: Secondary | ICD-10-CM

## 2014-03-27 ENCOUNTER — Telehealth: Payer: Self-pay | Admitting: Hematology and Oncology

## 2014-03-27 NOTE — Telephone Encounter (Signed)
Lvm advising appt chg from 2/19 (md pal) to 3/11. Mailed appt calendar.

## 2014-04-18 ENCOUNTER — Other Ambulatory Visit: Payer: Self-pay | Admitting: *Deleted

## 2014-04-18 DIAGNOSIS — E0841 Diabetes mellitus due to underlying condition with diabetic mononeuropathy: Secondary | ICD-10-CM

## 2014-04-18 MED ORDER — GABAPENTIN 300 MG PO CAPS: 300.0000 mg | ORAL_CAPSULE | Freq: Three times a day (TID) | ORAL | Status: DC

## 2014-04-18 NOTE — Telephone Encounter (Signed)
Received fax requesting refill on Neurontin.   Medication filled x1 with no refills.   Requires office visit before any further refills can be given.   Letter sent.

## 2014-05-14 ENCOUNTER — Other Ambulatory Visit: Payer: Self-pay | Admitting: *Deleted

## 2014-05-14 NOTE — Telephone Encounter (Signed)
Received fax requesting refill on gabapentin.   Refill denied.   Requires office visit before any further refills can be given.

## 2014-06-18 ENCOUNTER — Encounter: Payer: Self-pay | Admitting: Family Medicine

## 2014-06-18 ENCOUNTER — Ambulatory Visit (INDEPENDENT_AMBULATORY_CARE_PROVIDER_SITE_OTHER): Payer: Medicare Other | Admitting: Family Medicine

## 2014-06-18 VITALS — BP 128/74 | HR 76 | Temp 98.0°F | Resp 12 | Ht 64.0 in | Wt 198.0 lb

## 2014-06-18 DIAGNOSIS — Z23 Encounter for immunization: Secondary | ICD-10-CM | POA: Diagnosis not present

## 2014-06-18 DIAGNOSIS — E1143 Type 2 diabetes mellitus with diabetic autonomic (poly)neuropathy: Secondary | ICD-10-CM | POA: Diagnosis not present

## 2014-06-18 DIAGNOSIS — E1165 Type 2 diabetes mellitus with hyperglycemia: Secondary | ICD-10-CM | POA: Diagnosis not present

## 2014-06-18 DIAGNOSIS — E785 Hyperlipidemia, unspecified: Secondary | ICD-10-CM

## 2014-06-18 DIAGNOSIS — I10 Essential (primary) hypertension: Secondary | ICD-10-CM | POA: Diagnosis not present

## 2014-06-18 DIAGNOSIS — E1141 Type 2 diabetes mellitus with diabetic mononeuropathy: Secondary | ICD-10-CM | POA: Diagnosis not present

## 2014-06-18 DIAGNOSIS — E1149 Type 2 diabetes mellitus with other diabetic neurological complication: Secondary | ICD-10-CM

## 2014-06-18 DIAGNOSIS — E669 Obesity, unspecified: Secondary | ICD-10-CM

## 2014-06-18 DIAGNOSIS — IMO0002 Reserved for concepts with insufficient information to code with codable children: Secondary | ICD-10-CM

## 2014-06-18 LAB — LIPID PANEL
CHOLESTEROL: 83 mg/dL (ref 0–200)
HDL: 30 mg/dL — AB (ref >39)
LDL Cholesterol: 38 mg/dL (ref 0–99)
Total CHOL/HDL Ratio: 2.8 Ratio
Triglycerides: 74 mg/dL (ref <150)
VLDL: 15 mg/dL (ref 0–40)

## 2014-06-18 LAB — CBC WITH DIFFERENTIAL/PLATELET
BASOS ABS: 0 10*3/uL (ref 0.0–0.1)
BASOS PCT: 0 % (ref 0–1)
Eosinophils Absolute: 0.3 10*3/uL (ref 0.0–0.7)
Eosinophils Relative: 6 % — ABNORMAL HIGH (ref 0–5)
HCT: 38.6 % (ref 36.0–46.0)
Hemoglobin: 12.2 g/dL (ref 12.0–15.0)
LYMPHS PCT: 35 % (ref 12–46)
Lymphs Abs: 1.9 10*3/uL (ref 0.7–4.0)
MCH: 28.1 pg (ref 26.0–34.0)
MCHC: 31.6 g/dL (ref 30.0–36.0)
MCV: 88.9 fL (ref 78.0–100.0)
MONO ABS: 0.6 10*3/uL (ref 0.1–1.0)
MONOS PCT: 11 % (ref 3–12)
MPV: 9.4 fL (ref 8.6–12.4)
NEUTROS PCT: 48 % (ref 43–77)
Neutro Abs: 2.5 10*3/uL (ref 1.7–7.7)
PLATELETS: 247 10*3/uL (ref 150–400)
RBC: 4.34 MIL/uL (ref 3.87–5.11)
RDW: 14.6 % (ref 11.5–15.5)
WBC: 5.3 10*3/uL (ref 4.0–10.5)

## 2014-06-18 LAB — COMPLETE METABOLIC PANEL WITH GFR
ALT: 19 U/L (ref 0–35)
AST: 12 U/L (ref 0–37)
Albumin: 4.1 g/dL (ref 3.5–5.2)
Alkaline Phosphatase: 81 U/L (ref 39–117)
BUN: 21 mg/dL (ref 6–23)
CO2: 25 meq/L (ref 19–32)
CREATININE: 0.71 mg/dL (ref 0.50–1.10)
Calcium: 9.3 mg/dL (ref 8.4–10.5)
Chloride: 104 mEq/L (ref 96–112)
GFR, Est Non African American: 87 mL/min
Glucose, Bld: 218 mg/dL — ABNORMAL HIGH (ref 70–99)
POTASSIUM: 4.9 meq/L (ref 3.5–5.3)
SODIUM: 141 meq/L (ref 135–145)
Total Bilirubin: 0.4 mg/dL (ref 0.2–1.2)
Total Protein: 6.7 g/dL (ref 6.0–8.3)

## 2014-06-18 LAB — HEMOGLOBIN A1C
Hgb A1c MFr Bld: 9.6 % — ABNORMAL HIGH (ref <5.7)
Mean Plasma Glucose: 229 mg/dL — ABNORMAL HIGH (ref <117)

## 2014-06-18 NOTE — Assessment & Plan Note (Signed)
Blood pressure is well controlled no change in medication at this time

## 2014-06-18 NOTE — Progress Notes (Signed)
Patient ID: Kristen [name], female   DOB: [birth year], 69 y.o.   MRN: [id number]   Subjective:    Patient ID: Kristen [name], female    DOB: Oct 31, 1945, 69 y.o.   MRN: [id number]  Patient presents for F/U and R Foot Issue patient here to follow-up medications. She feels a small rough spot on the center of her right foot she's not had any pain tenderness or drainage from the area. She does have known diabetic neuropathy. She checks her blood sugars every now and then states fasting 120-140 and she is injecting 40 units of insulin.  She is status post left breast cancer status post mastectomy is currently on Aromasin her last mammogram was normal however she does need a bone density.  She is also due for flu shot and pneumonia booster    Review Of Systems:  GEN- denies fatigue, fever, weight loss,weakness, recent illness HEENT- denies eye drainage, change in vision, nasal discharge, CVS- denies chest pain, palpitations RESP- denies SOB, cough, wheeze ABD- denies N/V, change in stools, abd pain GU- denies dysuria, hematuria, dribbling, incontinence MSK- denies joint pain, muscle aches, injury Neuro- denies headache, dizziness, syncope, seizure activity       Objective:    BP 128/74 mmHg  Pulse 76  Temp(Src) 98 F (36.7 C) (Oral)  Resp 12  Ht 5\' 4"  (1.626 m)  Wt 198 lb (89.812 kg)  BMI 33.97 kg/m2 GEN- NAD, alert and oriented x3 HEENT- PERRL, EOMI, non injected sclera, pink conjunctiva, MMM, oropharynx clear Chest wall- left mastectomy CVS- RRR, no murmur RESP-CTAB ABD-NABS,soft,NT,ND, large pannus EXT- No edema Skin- small callus in center of foot, no erythema, NT Pulses- Radial, DP- 2+        Assessment & Plan:      Problem List Items Addressed This Visit      Unprioritized   Type II diabetes mellitus with neurological manifestations, uncontrolled - Primary    Type 2 diabetes uncontrolled with her full neuropathy. The spot on her foot is just a small callus-like  lesion no evidence of infection. Our goal is to get her A1c less than 7%.    Obesity   Hyperlipidemia    Recheck lipid panel goal is LDL less than 100    Relevant Orders      Lipid panel   Essential hypertension    Blood pressure is well controlled no change in medication at this time    Diabetic neuropathy      Note: This dictation was prepared with Dragon dictation along with smaller phrase technology. Any transcriptional errors that result from this process are unintentional.

## 2014-06-18 NOTE — Assessment & Plan Note (Signed)
Type 2 diabetes uncontrolled with her full neuropathy. The spot on her foot is just a small callus-like lesion no evidence of infection. Our goal is to get her A1c less than 7%.

## 2014-06-18 NOTE — Addendum Note (Signed)
Addended by: Sheral Flow on: 06/18/2014 10:45 AM   Modules accepted: Orders

## 2014-06-18 NOTE — Assessment & Plan Note (Signed)
Recheck lipid panel goal is LDL less than 100

## 2014-06-18 NOTE — Patient Instructions (Signed)
Continue current meds We will call with lab results Flu shot and pneumonia booster given F/U 3 months

## 2014-06-19 ENCOUNTER — Other Ambulatory Visit: Payer: Self-pay | Admitting: Family Medicine

## 2014-06-19 DIAGNOSIS — M810 Age-related osteoporosis without current pathological fracture: Secondary | ICD-10-CM

## 2014-06-19 DIAGNOSIS — Z853 Personal history of malignant neoplasm of breast: Secondary | ICD-10-CM

## 2014-06-20 ENCOUNTER — Telehealth: Payer: Self-pay | Admitting: *Deleted

## 2014-06-20 NOTE — Telephone Encounter (Signed)
Pt has appointment Jan 20 at [facility] for Bone Density test on 2:00pm needs to arrive at 1:45pm, lmtrc to pts daughter, pt is aware of appt but wanted me to call daughter to inform her of appointment.

## 2014-06-20 NOTE — Telephone Encounter (Signed)
Pt called back and is aware of pts appt

## 2014-07-02 ENCOUNTER — Ambulatory Visit
Admission: RE | Admit: 2014-07-02 | Discharge: 2014-07-02 | Disposition: A | Discharge status: Home / Self Care | Payer: Medicare Other | Source: Ambulatory Visit | Attending: Family Medicine | Admitting: Family Medicine

## 2014-07-02 DIAGNOSIS — Z13820 Encounter for screening for osteoporosis: Secondary | ICD-10-CM | POA: Diagnosis not present

## 2014-07-02 DIAGNOSIS — M810 Age-related osteoporosis without current pathological fracture: Secondary | ICD-10-CM

## 2014-07-02 DIAGNOSIS — Z853 Personal history of malignant neoplasm of breast: Secondary | ICD-10-CM

## 2014-07-02 DIAGNOSIS — Z780 Asymptomatic menopausal state: Secondary | ICD-10-CM | POA: Diagnosis not present

## 2014-07-17 ENCOUNTER — Encounter: Payer: Self-pay | Admitting: *Deleted

## 2014-08-01 ENCOUNTER — Other Ambulatory Visit: Payer: Medicare Other

## 2014-08-01 ENCOUNTER — Ambulatory Visit: Payer: Medicare Other | Admitting: Hematology and Oncology

## 2014-08-21 ENCOUNTER — Other Ambulatory Visit: Payer: Self-pay

## 2014-08-21 DIAGNOSIS — C50412 Malignant neoplasm of upper-outer quadrant of left female breast: Secondary | ICD-10-CM

## 2014-08-22 ENCOUNTER — Other Ambulatory Visit (HOSPITAL_BASED_OUTPATIENT_CLINIC_OR_DEPARTMENT_OTHER): Payer: Medicare Other

## 2014-08-22 ENCOUNTER — Ambulatory Visit (HOSPITAL_BASED_OUTPATIENT_CLINIC_OR_DEPARTMENT_OTHER): Payer: Medicare Other | Admitting: Hematology and Oncology

## 2014-08-22 ENCOUNTER — Telehealth: Payer: Self-pay | Admitting: Hematology and Oncology

## 2014-08-22 VITALS — BP 120/60 | HR 67 | Temp 98.5°F | Resp 18 | Ht 64.0 in | Wt 197.5 lb

## 2014-08-22 DIAGNOSIS — Z853 Personal history of malignant neoplasm of breast: Secondary | ICD-10-CM | POA: Diagnosis not present

## 2014-08-22 DIAGNOSIS — C50412 Malignant neoplasm of upper-outer quadrant of left female breast: Secondary | ICD-10-CM

## 2014-08-22 LAB — COMPREHENSIVE METABOLIC PANEL (CC13)
ALT: 27 U/L (ref 0–55)
ANION GAP: 12 meq/L — AB (ref 3–11)
AST: 14 U/L (ref 5–34)
Albumin: 3.9 g/dL (ref 3.5–5.0)
Alkaline Phosphatase: 93 U/L (ref 40–150)
BUN: 26.9 mg/dL — ABNORMAL HIGH (ref 7.0–26.0)
CO2: 25 meq/L (ref 22–29)
Calcium: 9.5 mg/dL (ref 8.4–10.4)
Chloride: 104 mEq/L (ref 98–109)
Creatinine: 1.1 mg/dL (ref 0.6–1.1)
EGFR: 49 mL/min/{1.73_m2} — ABNORMAL LOW (ref >90)
GLUCOSE: 204 mg/dL — AB (ref 70–140)
Potassium: 5.7 mEq/L — ABNORMAL HIGH (ref 3.5–5.1)
SODIUM: 140 meq/L (ref 136–145)
TOTAL PROTEIN: 7.5 g/dL (ref 6.4–8.3)
Total Bilirubin: 0.5 mg/dL (ref 0.20–1.20)

## 2014-08-22 LAB — CBC WITH DIFFERENTIAL/PLATELET
BASO%: 0.8 % (ref 0.0–2.0)
Basophils Absolute: 0.1 10*3/uL (ref 0.0–0.1)
EOS ABS: 0.3 10*3/uL (ref 0.0–0.5)
EOS%: 4.5 % (ref 0.0–7.0)
HEMATOCRIT: 40.2 % (ref 34.8–46.6)
HGB: 12.5 g/dL (ref 11.6–15.9)
LYMPH#: 2.3 10*3/uL (ref 0.9–3.3)
LYMPH%: 30.8 % (ref 14.0–49.7)
MCH: 27.6 pg (ref 25.1–34.0)
MCHC: 31 g/dL — AB (ref 31.5–36.0)
MCV: 89 fL (ref 79.5–101.0)
MONO#: 0.6 10*3/uL (ref 0.1–0.9)
MONO%: 8.4 % (ref 0.0–14.0)
NEUT#: 4.2 10*3/uL (ref 1.5–6.5)
NEUT%: 55.5 % (ref 38.4–76.8)
Platelets: 263 10*3/uL (ref 145–400)
RBC: 4.52 10*6/uL (ref 3.70–5.45)
RDW: 15.1 % — ABNORMAL HIGH (ref 11.2–14.5)
WBC: 7.5 10*3/uL (ref 3.9–10.3)

## 2014-08-22 NOTE — Progress Notes (Signed)
Patient Care Team: Alycia Rossetti, MD as PCP - General (Family Medicine)  DIAGNOSIS: Malignant neoplasm of upper-outer quadrant of left female breast   Staging form: Breast, AJCC 7th Edition     Pathologic: Stage IA (T1a, N0, cM0) - Signed by Rulon Eisenmenger, MD on 01/31/2014   SUMMARY OF ONCOLOGIC HISTORY:   Malignant neoplasm of upper-outer quadrant of left female breast   11/30/2012 Initial Diagnosis Malignant neoplasm of upper-outer quadrant of left female breast: Left breast bloody nipple discharge that led to mammogram. Initial biopsy revealed DCIS ER/PR positive   12/24/2012 Surgery Left mastectomy with sentinel lymph node biopsy. Invasive ductal carcinoma grade 2 with 2 foci each measuring less than 0.1 cm with high-grade DCIS 2.6 cm and 1.4 cm 6 sentinel nodes negative ER 100% PR 100% HER-2 negative Ki-67 14%   01/31/2013 -  Anti-estrogen oral therapy Aromasin 25 mg once daily    CHIEF COMPLIANT: Follow-up of breast cancer on Aromasin  INTERVAL HISTORY: Kristen [name] is a 69 year old lady with above-mentioned history of left breast cancer treated with mastectomy and is now on Aromasin since August 2014. She is tolerating it fairly well does get hot flashes intermittently. She had recent bone density showing normal bone density as well as a recent mammogram which was also normal. She reports no new problems or concerns with her breasts. Denies any lumps or nodules.  REVIEW OF SYSTEMS:   Constitutional: Denies fevers, chills or abnormal weight loss Eyes: Denies blurriness of vision Ears, nose, mouth, throat, and face: Denies mucositis or sore throat Respiratory: Denies cough, dyspnea or wheezes Cardiovascular: Denies palpitation, chest discomfort or lower extremity swelling Gastrointestinal:  Denies nausea, heartburn or change in bowel habits Skin: Denies abnormal skin rashes Lymphatics: Denies new lymphadenopathy or easy bruising Neurological:Denies numbness, tingling or new  weaknesses Behavioral/Psych: Mood is stable, no new changes  Breast:  denies any pain or lumps or nodules in either breasts All other systems were reviewed with the patient and are negative.  I have reviewed the past medical history, past surgical history, social history and family history with the patient and they are unchanged from previous note.  ALLERGIES:  is allergic to sulfonamide derivatives.  MEDICATIONS:  Current Outpatient Prescriptions  Medication Sig Dispense Refill  . aspirin EC 81 MG tablet Take 81 mg by mouth daily.    Marland Kitchen atorvastatin (LIPITOR) 40 MG tablet Take 1 tablet (40 mg total) by mouth daily at 6 PM. 90 tablet 2  . Docusate Calcium (STOOL SOFTENER PO) Take 1 capsule by mouth 2 (two) times daily.     Marland Kitchen exemestane (AROMASIN) 25 MG tablet Take 1 tablet (25 mg total) by mouth daily after breakfast. 90 tablet 2  . ezetimibe (ZETIA) 10 MG tablet Take 1 tablet (10 mg total) by mouth daily. 90 tablet 2  . FLUoxetine (PROZAC) 40 MG capsule Take 1 capsule (40 mg total) by mouth daily. 90 capsule 2  . gabapentin (NEURONTIN) 300 MG capsule Take 1 capsule (300 mg total) by mouth 3 (three) times daily. 90 capsule 0  . ibuprofen (ADVIL,MOTRIN) 200 MG tablet Take 200 mg by mouth every 6 (six) hours as needed for pain.    Marland Kitchen insulin glargine (LANTUS) 100 unit/mL SOPN Inject 0.4 mLs (40 Units total) into the skin at bedtime. 45 mL 2  . lisinopril (PRINIVIL,ZESTRIL) 10 MG tablet Take 1 tablet (10 mg total) by mouth daily. 90 tablet 2  . metFORMIN (GLUCOPHAGE) 1000 MG tablet Take 1 tablet (1,000 mg  total) by mouth 2 (two) times daily with a meal. 180 tablet 2  . omeprazole (PRILOSEC) 20 MG capsule Take 1 capsule (20 mg total) by mouth 2 (two) times daily. 180 capsule 2  . oxybutynin (DITROPAN) 5 MG tablet Take 1 tablet (5 mg total) by mouth 2 (two) times daily. 180 tablet 2  . promethazine (PHENERGAN) 12.5 MG tablet Take 1 tablet (12.5 mg total) by mouth every 6 (six) hours as needed for  nausea. 10 tablet 0   No current facility-administered medications for this visit.    PHYSICAL EXAMINATION: ECOG PERFORMANCE STATUS: 1 - Symptomatic but completely ambulatory  Filed Vitals:   08/22/14 1133  BP: 120/60  Pulse: 67  Temp: 98.5 F (36.9 C)  Resp: 18   Filed Weights   08/22/14 1133  Weight: 197 lb 8 oz (89.585 kg)    GENERAL:alert, no distress and comfortable SKIN: skin color, texture, turgor are normal, no rashes or significant lesions EYES: normal, Conjunctiva are pink and non-injected, sclera clear OROPHARYNX:no exudate, no erythema and lips, buccal mucosa, and tongue normal  NECK: supple, thyroid normal size, non-tender, without nodularity LYMPH:  no palpable lymphadenopathy in the cervical, axillary or inguinal LUNGS: clear to auscultation and percussion with normal breathing effort HEART: regular rate & rhythm and no murmurs and no lower extremity edema ABDOMEN:abdomen soft, non-tender and normal bowel sounds Musculoskeletal:no cyanosis of digits and no clubbing  NEURO: alert & oriented x 3 with fluent speech, no focal motor/sensory deficits BREAST: Left mastectomy is without any palpable abnormalities right breast is normal without any lumps or nodules. No palpable axillary supraclavicular or infraclavicular adenopathy no breast tenderness or nipple discharge. (exam performed in the presence of a chaperone)  LABORATORY DATA:  I have reviewed the data as listed   Chemistry      Component Value Date/Time   NA 141 06/18/2014 0954   NA 140 01/31/2014 1012   K 4.9 06/18/2014 0954   K 5.1 01/31/2014 1012   CL 104 06/18/2014 0954   CO2 25 06/18/2014 0954   CO2 25 01/31/2014 1012   BUN 21 06/18/2014 0954   BUN 24.3 01/31/2014 1012   CREATININE 0.71 06/18/2014 0954   CREATININE 1.2* 01/31/2014 1012   CREATININE 0.97 12/25/2012 0615      Component Value Date/Time   CALCIUM 9.3 06/18/2014 0954   CALCIUM 9.5 01/31/2014 1012   ALKPHOS 81 06/18/2014 0954    ALKPHOS 84 01/31/2014 1012   AST 12 06/18/2014 0954   AST 18 01/31/2014 1012   ALT 19 06/18/2014 0954   ALT 28 01/31/2014 1012   BILITOT 0.4 06/18/2014 0954   BILITOT 0.46 01/31/2014 1012       Lab Results  Component Value Date   WBC 7.5 08/22/2014   HGB 12.5 08/22/2014   HCT 40.2 08/22/2014   MCV 89.0 08/22/2014   PLT 263 08/22/2014   NEUTROABS 4.2 08/22/2014    ASSESSMENT & PLAN:  Malignant neoplasm of upper-outer quadrant of left female breast Left breast invasive ductal carcinoma with DCIS T1, N0, M0 stage IA ER/PR positive HER-2 negative status post right mastectomy currently on adjuvant hormonal therapy with Aromasin since 01/11/2013.  Aromasin toxicities: She is tolerating it very well without any major problems or concerns. Denies any hot flashes achiness denies any vaginal bleeding or muscle aches or pains.  Breast cancerSurveillance:  1. Breast exam 08/22/2014 is normal 2. Mammogram and bone density were reviewed with the patient and they were normal done on 03/03/2014.  3. Bone density showed a T score of -0.9. Done on 07/02/2014  Return to clinic in 6 months for follow-up breast exams.   No orders of the defined types were placed in this encounter.   The patient has a good understanding of the overall plan. she agrees with it. She will call with any problems that may develop before her next visit here.   Rulon Eisenmenger, MD

## 2014-08-22 NOTE — Telephone Encounter (Signed)
per pof to sch pt appt-cld * spoke to pt daughter Arbie Cookey to adv of time & date-daughter understood

## 2014-08-22 NOTE — Assessment & Plan Note (Signed)
Left breast invasive ductal carcinoma with DCIS T1, N0, M0 stage IA ER/PR positive HER-2 negative status post right mastectomy currently on adjuvant hormonal therapy with Aromasin since 01/11/2013.  Aromasin toxicities: She is tolerating it very well without any major problems or concerns. Denies any hot flashes achiness denies any vaginal bleeding or muscle aches or pains.  Breast cancerSurveillance:  1. Breast exam 08/22/2014 is normal 2. I do not see any results for mammogram from 2015.  Return to clinic in 6 months for follow-up breast exams.

## 2014-09-09 ENCOUNTER — Other Ambulatory Visit: Payer: Self-pay | Admitting: *Deleted

## 2014-09-09 MED ORDER — ATORVASTATIN CALCIUM 40 MG PO TABS: 40.0000 mg | ORAL_TABLET | Freq: Every day | ORAL | Status: DC

## 2014-09-09 MED ORDER — LISINOPRIL 10 MG PO TABS: 10.0000 mg | ORAL_TABLET | Freq: Every day | ORAL | Status: DC

## 2014-09-09 NOTE — Telephone Encounter (Signed)
Received fax requesting refill on lisinopril and lipitor with 90 day supply.   Refill appropriate and filled per protocol.

## 2014-09-24 ENCOUNTER — Ambulatory Visit: Payer: Medicare Other | Admitting: Family Medicine

## 2014-09-26 ENCOUNTER — Ambulatory Visit (INDEPENDENT_AMBULATORY_CARE_PROVIDER_SITE_OTHER): Payer: Medicare Other | Admitting: Family Medicine

## 2014-09-26 ENCOUNTER — Encounter: Payer: Self-pay | Admitting: Family Medicine

## 2014-09-26 VITALS — BP 130/82 | HR 68 | Temp 98.5°F | Resp 16 | Ht 64.0 in | Wt 198.0 lb

## 2014-09-26 DIAGNOSIS — E1141 Type 2 diabetes mellitus with diabetic mononeuropathy: Secondary | ICD-10-CM | POA: Diagnosis not present

## 2014-09-26 DIAGNOSIS — E669 Obesity, unspecified: Secondary | ICD-10-CM

## 2014-09-26 DIAGNOSIS — M8949 Other hypertrophic osteoarthropathy, multiple sites: Secondary | ICD-10-CM

## 2014-09-26 DIAGNOSIS — E1165 Type 2 diabetes mellitus with hyperglycemia: Principal | ICD-10-CM

## 2014-09-26 DIAGNOSIS — I10 Essential (primary) hypertension: Secondary | ICD-10-CM | POA: Diagnosis not present

## 2014-09-26 DIAGNOSIS — G990 Autonomic neuropathy in diseases classified elsewhere: Secondary | ICD-10-CM | POA: Diagnosis not present

## 2014-09-26 DIAGNOSIS — E1149 Type 2 diabetes mellitus with other diabetic neurological complication: Secondary | ICD-10-CM

## 2014-09-26 DIAGNOSIS — M150 Primary generalized (osteo)arthritis: Secondary | ICD-10-CM | POA: Diagnosis not present

## 2014-09-26 DIAGNOSIS — E1143 Type 2 diabetes mellitus with diabetic autonomic (poly)neuropathy: Secondary | ICD-10-CM

## 2014-09-26 DIAGNOSIS — IMO0002 Reserved for concepts with insufficient information to code with codable children: Secondary | ICD-10-CM

## 2014-09-26 DIAGNOSIS — M159 Polyosteoarthritis, unspecified: Secondary | ICD-10-CM

## 2014-09-26 LAB — COMPREHENSIVE METABOLIC PANEL
ALT: 26 U/L (ref 0–35)
AST: 16 U/L (ref 0–37)
Albumin: 4.3 g/dL (ref 3.5–5.2)
Alkaline Phosphatase: 86 U/L (ref 39–117)
BUN: 24 mg/dL — AB (ref 6–23)
CALCIUM: 9.6 mg/dL (ref 8.4–10.5)
CHLORIDE: 100 meq/L (ref 96–112)
CO2: 28 mEq/L (ref 19–32)
Creat: 0.98 mg/dL (ref 0.50–1.10)
Glucose, Bld: 192 mg/dL — ABNORMAL HIGH (ref 70–99)
Potassium: 5.5 mEq/L — ABNORMAL HIGH (ref 3.5–5.3)
Sodium: 138 mEq/L (ref 135–145)
Total Bilirubin: 0.5 mg/dL (ref 0.2–1.2)
Total Protein: 7.3 g/dL (ref 6.0–8.3)

## 2014-09-26 LAB — CBC WITH DIFFERENTIAL/PLATELET
Basophils Absolute: 0 10*3/uL (ref 0.0–0.1)
Basophils Relative: 0 % (ref 0–1)
Eosinophils Absolute: 0.4 10*3/uL (ref 0.0–0.7)
Eosinophils Relative: 5 % (ref 0–5)
HCT: 39.6 % (ref 36.0–46.0)
Hemoglobin: 12.8 g/dL (ref 12.0–15.0)
Lymphocytes Relative: 29 % (ref 12–46)
Lymphs Abs: 2.1 10*3/uL (ref 0.7–4.0)
MCH: 28.5 pg (ref 26.0–34.0)
MCHC: 32.3 g/dL (ref 30.0–36.0)
MCV: 88.2 fL (ref 78.0–100.0)
MPV: 9.7 fL (ref 8.6–12.4)
Monocytes Absolute: 0.6 10*3/uL (ref 0.1–1.0)
Monocytes Relative: 8 % (ref 3–12)
Neutro Abs: 4.1 10*3/uL (ref 1.7–7.7)
Neutrophils Relative %: 58 % (ref 43–77)
Platelets: 248 10*3/uL (ref 150–400)
RBC: 4.49 MIL/uL (ref 3.87–5.11)
RDW: 15.2 % (ref 11.5–15.5)
WBC: 7.1 10*3/uL (ref 4.0–10.5)

## 2014-09-26 LAB — HEMOGLOBIN A1C
Hgb A1c MFr Bld: 9.6 % — ABNORMAL HIGH (ref <5.7)
MEAN PLASMA GLUCOSE: 229 mg/dL — AB (ref <117)

## 2014-09-26 LAB — LIPID PANEL
CHOL/HDL RATIO: 3.2 ratio
CHOLESTEROL: 120 mg/dL (ref 0–200)
HDL: 37 mg/dL — ABNORMAL LOW (ref >=46)
LDL Cholesterol: 55 mg/dL (ref 0–99)
Triglycerides: 138 mg/dL (ref <150)
VLDL: 28 mg/dL (ref 0–40)

## 2014-09-26 NOTE — Assessment & Plan Note (Signed)
Blood pressure is well-controlled at change her medication

## 2014-09-26 NOTE — Progress Notes (Signed)
Patient ID: Kristen [name], female   DOB: 1946-02-12, 69 y.o.   MRN: [id number]   Subjective:    Patient ID: Kristen [name], female    DOB: [birth year], 69 y.o.   MRN: [id number]  Patient presents for 3 month F/U   patient in follow-up chronic medical problem. She has uncontrolled diabetes mellitus hypertension hyperlipidemia recent breast cancer now on Aromasin.  She has no concerns today. She did not bring her meter with her today states her blood sugars have been good about a month ago she states they were in the 300s however her granddaughter increase her Lantus to 52 units and they have come down since then. Denies any hypoglycemia symptoms to polyuria and polydipsia   Review Of Systems:  GEN- denies fatigue, fever, weight loss,weakness, recent illness HEENT- denies eye drainage, change in vision, nasal discharge, CVS- denies chest pain, palpitations RESP- denies SOB, cough, wheeze ABD- denies N/V, change in stools, abd pain GU- denies dysuria, hematuria, dribbling, incontinence MSK- +joint pain, muscle aches, injury Neuro- denies headache, dizziness, syncope, seizure activity       Objective:    BP 130/82 mmHg  Pulse 68  Temp(Src) 98.5 F (36.9 C) (Oral)  Resp 16  Ht 5\' 4"  (1.626 m)  Wt 198 lb (89.812 kg)  BMI 33.97 kg/m2 GEN- NAD, alert and oriented x3 HEENT- PERRL, EOMI, non injected sclera, pink conjunctiva, MMM, oropharynx clear Chest wall- left mastectomy CVS- RRR, no murmur RESP-CTAB EXT- No edema Pulses- Radial, DP- 2+        Assessment & Plan:      Problem List Items Addressed This Visit    None      Note: This dictation was prepared with Dragon dictation along with smaller phrase technology. Any transcriptional errors that result from this process are unintentional.

## 2014-09-26 NOTE — Assessment & Plan Note (Addendum)
I will recheck her A1c, she has been uncontrolled for quite some time. Continue to stress dietary changes and medication adherence Goal A1C < 8%

## 2014-09-26 NOTE — Patient Instructions (Signed)
Try to walk every day Continue current medications Take Lantus 52 units  F/U 3 months

## 2014-09-26 NOTE — Assessment & Plan Note (Signed)
Continue gabapentin her symptoms will also improve as she improves her glucose control

## 2014-09-30 LAB — MICROALBUMIN / CREATININE URINE RATIO
Creatinine, Urine: 122.8 mg/dL
Microalb Creat Ratio: 46.4 mg/g — ABNORMAL HIGH (ref 0.0–30.0)
Microalb, Ur: 5.7 mg/dL — ABNORMAL HIGH (ref <2.0)

## 2014-10-03 ENCOUNTER — Other Ambulatory Visit: Payer: Self-pay | Admitting: *Deleted

## 2014-10-03 MED ORDER — FLUOXETINE HCL 40 MG PO CAPS: 40.0000 mg | ORAL_CAPSULE | Freq: Every day | ORAL | Status: DC

## 2014-10-03 MED ORDER — METFORMIN HCL 1000 MG PO TABS: 1000.0000 mg | ORAL_TABLET | Freq: Two times a day (BID) | ORAL | Status: DC

## 2014-10-03 MED ORDER — OXYBUTYNIN CHLORIDE 5 MG PO TABS: 5.0000 mg | ORAL_TABLET | Freq: Two times a day (BID) | ORAL | Status: DC

## 2014-10-03 MED ORDER — OMEPRAZOLE 20 MG PO CPDR: 20.0000 mg | DELAYED_RELEASE_CAPSULE | Freq: Two times a day (BID) | ORAL | Status: DC

## 2014-10-03 MED ORDER — EZETIMIBE 10 MG PO TABS: 10.0000 mg | ORAL_TABLET | Freq: Every day | ORAL | Status: DC

## 2014-10-03 NOTE — Telephone Encounter (Signed)
Received fax requesting refill on Zetia.   Refill appropriate and filled per protocol.

## 2014-10-03 NOTE — Telephone Encounter (Signed)
Received fax requesting refill on Ditropan, Prozac, Metformin, and Omeprazole.   Refill appropriate and filled per protocol.

## 2014-10-20 ENCOUNTER — Ambulatory Visit: Payer: Medicare Other | Admitting: Family Medicine

## 2014-10-24 ENCOUNTER — Other Ambulatory Visit: Payer: Self-pay | Admitting: *Deleted

## 2014-10-24 MED ORDER — EXEMESTANE 25 MG PO TABS: 25.0000 mg | ORAL_TABLET | Freq: Every day | ORAL | Status: DC

## 2014-10-24 NOTE — Telephone Encounter (Signed)
Received call from pharmacy requesting refill on Aromasin.   Refill appropriate and filled per protocol.

## 2014-12-01 ENCOUNTER — Other Ambulatory Visit: Payer: Self-pay | Admitting: *Deleted

## 2014-12-01 DIAGNOSIS — E119 Type 2 diabetes mellitus without complications: Secondary | ICD-10-CM

## 2014-12-01 MED ORDER — INSULIN GLARGINE 100 UNITS/ML SOLOSTAR PEN: 40.0000 [IU] | PEN_INJECTOR | Freq: Every day | SUBCUTANEOUS | Status: DC

## 2014-12-01 NOTE — Telephone Encounter (Signed)
Received fax requesting refill on Lantus.   Refill appropriate and filled per protocol.   

## 2014-12-29 ENCOUNTER — Encounter: Payer: Self-pay | Admitting: Family Medicine

## 2014-12-29 ENCOUNTER — Ambulatory Visit (INDEPENDENT_AMBULATORY_CARE_PROVIDER_SITE_OTHER): Payer: Medicare Other | Admitting: Family Medicine

## 2014-12-29 VITALS — BP 128/64 | HR 70 | Temp 98.2°F | Resp 14 | Ht 64.0 in | Wt 195.0 lb

## 2014-12-29 DIAGNOSIS — E669 Obesity, unspecified: Secondary | ICD-10-CM | POA: Diagnosis not present

## 2014-12-29 DIAGNOSIS — E559 Vitamin D deficiency, unspecified: Secondary | ICD-10-CM

## 2014-12-29 DIAGNOSIS — E1143 Type 2 diabetes mellitus with diabetic autonomic (poly)neuropathy: Secondary | ICD-10-CM | POA: Diagnosis not present

## 2014-12-29 DIAGNOSIS — E1165 Type 2 diabetes mellitus with hyperglycemia: Principal | ICD-10-CM

## 2014-12-29 DIAGNOSIS — E785 Hyperlipidemia, unspecified: Secondary | ICD-10-CM

## 2014-12-29 DIAGNOSIS — E1141 Type 2 diabetes mellitus with diabetic mononeuropathy: Secondary | ICD-10-CM

## 2014-12-29 DIAGNOSIS — I10 Essential (primary) hypertension: Secondary | ICD-10-CM | POA: Diagnosis not present

## 2014-12-29 DIAGNOSIS — IMO0002 Reserved for concepts with insufficient information to code with codable children: Secondary | ICD-10-CM

## 2014-12-29 DIAGNOSIS — E0841 Diabetes mellitus due to underlying condition with diabetic mononeuropathy: Secondary | ICD-10-CM | POA: Diagnosis not present

## 2014-12-29 DIAGNOSIS — E1149 Type 2 diabetes mellitus with other diabetic neurological complication: Secondary | ICD-10-CM

## 2014-12-29 LAB — COMPREHENSIVE METABOLIC PANEL WITH GFR
ALT: 22 U/L (ref 0–35)
AST: 12 U/L (ref 0–37)
Albumin: 3.9 g/dL (ref 3.5–5.2)
Alkaline Phosphatase: 67 U/L (ref 39–117)
BUN: 23 mg/dL (ref 6–23)
CO2: 27 meq/L (ref 19–32)
Calcium: 9.6 mg/dL (ref 8.4–10.5)
Chloride: 105 meq/L (ref 96–112)
Creat: 0.96 mg/dL (ref 0.50–1.10)
Glucose, Bld: 183 mg/dL — ABNORMAL HIGH (ref 70–99)
Potassium: 5.5 meq/L — ABNORMAL HIGH (ref 3.5–5.3)
Sodium: 141 meq/L (ref 135–145)
Total Bilirubin: 0.5 mg/dL (ref 0.2–1.2)
Total Protein: 6.7 g/dL (ref 6.0–8.3)

## 2014-12-29 LAB — LIPID PANEL
CHOL/HDL RATIO: 2.7 ratio
Cholesterol: 89 mg/dL (ref 0–200)
HDL: 33 mg/dL — ABNORMAL LOW (ref >=46)
LDL Cholesterol: 34 mg/dL (ref 0–99)
TRIGLYCERIDES: 111 mg/dL (ref <150)
VLDL: 22 mg/dL (ref 0–40)

## 2014-12-29 LAB — HEMOGLOBIN A1C, FINGERSTICK: HEMOGLOBIN A1C, FINGERSTICK: 8.7 % — AB (ref <5.7)

## 2014-12-29 MED ORDER — OMEPRAZOLE 20 MG PO CPDR: 20.0000 mg | DELAYED_RELEASE_CAPSULE | Freq: Two times a day (BID) | ORAL | Status: DC

## 2014-12-29 MED ORDER — GABAPENTIN 300 MG PO CAPS: 300.0000 mg | ORAL_CAPSULE | Freq: Three times a day (TID) | ORAL | Status: DC

## 2014-12-29 MED ORDER — EZETIMIBE 10 MG PO TABS: 10.0000 mg | ORAL_TABLET | Freq: Every day | ORAL | Status: DC

## 2014-12-29 MED ORDER — LISINOPRIL 10 MG PO TABS: 10.0000 mg | ORAL_TABLET | Freq: Every day | ORAL | Status: DC

## 2014-12-29 MED ORDER — LANCETS MISC. KIT: PACK | Status: DC

## 2014-12-29 MED ORDER — OXYBUTYNIN CHLORIDE 5 MG PO TABS: 5.0000 mg | ORAL_TABLET | Freq: Two times a day (BID) | ORAL | Status: DC

## 2014-12-29 MED ORDER — LANCETS MISC: Status: DC

## 2014-12-29 MED ORDER — FLUOXETINE HCL 40 MG PO CAPS: 40.0000 mg | ORAL_CAPSULE | Freq: Every day | ORAL | Status: DC

## 2014-12-29 MED ORDER — GLUCOSE BLOOD VI STRP: ORAL_STRIP | Status: DC

## 2014-12-29 MED ORDER — BLOOD GLUCOSE MONITOR KIT: PACK | Status: DC

## 2014-12-29 MED ORDER — METFORMIN HCL 1000 MG PO TABS: 1000.0000 mg | ORAL_TABLET | Freq: Two times a day (BID) | ORAL | Status: DC

## 2014-12-29 MED ORDER — ATORVASTATIN CALCIUM 40 MG PO TABS: 40.0000 mg | ORAL_TABLET | Freq: Every day | ORAL | Status: DC

## 2014-12-29 NOTE — Assessment & Plan Note (Signed)
Well controlled, no change to medications 

## 2014-12-29 NOTE — Assessment & Plan Note (Addendum)
Diabetes is chronically uncontrolled, goal A1C < 8% A1C today 8.7% which shows some improvement- from previous  9.6%  Continue Lantus 60units, continue to monitor diet, will send over new meter, needs to test three times a day  Restart gabapentin for neuropathy, on ACE, and statin drug, ASA

## 2014-12-29 NOTE — Progress Notes (Signed)
Patient ID: Kristen [name], female   DOB: 12/20/1945, 69 y.o.   MRN: [id number]   Subjective:    Patient ID: Kristen [name], female    DOB: Nov 05, 1945, 69 y.o.   MRN: [id number]  Patient presents for 3 month F/U patient here to follow-up medical problems. Diabetes mellitus. Her last A1c was elevated at 9.6%. She is injecting 60 units of Lantus. 14 day avg 199, 30 day Avg 211. Her blood sugar last night i was 170 ,  Highest is  290 she has not had any hypoglycemia episodes. She continues to have diabetic neuropathy however has been out of her gabapentin for the past couple months. She's been taking all her other medications as prescribed. She has no problems with her blood pressure. She is also being followed by oncology for history of breast cancer.    Review Of Systems:  GEN- denies fatigue, fever, weight loss,weakness, recent illness HEENT- denies eye drainage, change in vision, nasal discharge, CVS- denies chest pain, palpitations RESP- denies SOB, cough, wheeze ABD- denies N/V, change in stools, abd pain GU- denies dysuria, hematuria, dribbling, incontinence MSK- + joint pain, muscle aches, injury Neuro- denies headache, dizziness, syncope, seizure activity       Objective:    BP 128/64 mmHg  Pulse 70  Temp(Src) 98.2 F (36.8 C) (Oral)  Resp 14  Ht 5\' 4"  (1.626 m)  Wt 195 lb (88.451 kg)  BMI 33.46 kg/m2 GEN- NAD, alert and oriented x3 HEENT- PERRL, EOMI, non injected sclera, pink conjunctiva, MMM, oropharynx clear Neck- Supple, no thyromegaly CVS- RRR, no murmur RESP-CTAB EXT- No edema Pulses- Radial, DP- 2+        Assessment & Plan:      Problem List Items Addressed This Visit    Vitamin D deficiency   Type II diabetes mellitus with neurological manifestations, uncontrolled - Primary   Obesity   Hyperlipidemia   Essential hypertension   Diabetic neuropathy      Note: This dictation was prepared with Dragon dictation along with smaller phrase technology. Any  transcriptional errors that result from this process are unintentional.

## 2014-12-29 NOTE — Patient Instructions (Addendum)
Give Lantus 60 units Continue all other medications We will call with lab results F/U 3 months for PHYSICAL

## 2014-12-29 NOTE — Assessment & Plan Note (Signed)
Recheck lipids, continue lipitor and zetia, recheck LFT

## 2014-12-30 LAB — CBC WITH DIFFERENTIAL/PLATELET
BASOS ABS: 0 10*3/uL (ref 0.0–0.1)
Basophils Relative: 0 % (ref 0–1)
Eosinophils Absolute: 0.3 10*3/uL (ref 0.0–0.7)
Eosinophils Relative: 4 % (ref 0–5)
HEMATOCRIT: 37.8 % (ref 36.0–46.0)
HEMOGLOBIN: 11.9 g/dL — AB (ref 12.0–15.0)
Lymphocytes Relative: 35 % (ref 12–46)
Lymphs Abs: 2.2 10*3/uL (ref 0.7–4.0)
MCH: 27.8 pg (ref 26.0–34.0)
MCHC: 31.5 g/dL (ref 30.0–36.0)
MCV: 88.3 fL (ref 78.0–100.0)
MPV: 9.6 fL (ref 8.6–12.4)
Monocytes Absolute: 0.3 10*3/uL (ref 0.1–1.0)
Monocytes Relative: 5 % (ref 3–12)
Neutro Abs: 3.6 10*3/uL (ref 1.7–7.7)
Neutrophils Relative %: 56 % (ref 43–77)
Platelets: 253 10*3/uL (ref 150–400)
RBC: 4.28 MIL/uL (ref 3.87–5.11)
RDW: 15 % (ref 11.5–15.5)
WBC: 6.4 10*3/uL (ref 4.0–10.5)

## 2014-12-30 LAB — VITAMIN D 25 HYDROXY (VIT D DEFICIENCY, FRACTURES): VIT D 25 HYDROXY: 45 ng/mL (ref 30–100)

## 2014-12-31 ENCOUNTER — Other Ambulatory Visit: Payer: Self-pay | Admitting: *Deleted

## 2014-12-31 DIAGNOSIS — E875 Hyperkalemia: Secondary | ICD-10-CM

## 2014-12-31 MED ORDER — SODIUM POLYSTYRENE SULFONATE PO POWD: ORAL | Status: DC

## 2015-01-05 ENCOUNTER — Other Ambulatory Visit: Payer: Medicare Other

## 2015-01-05 DIAGNOSIS — E875 Hyperkalemia: Secondary | ICD-10-CM | POA: Diagnosis not present

## 2015-01-05 LAB — BASIC METABOLIC PANEL
BUN: 22 mg/dL (ref 7–25)
CO2: 26 mmol/L (ref 20–31)
Calcium: 8.9 mg/dL (ref 8.6–10.4)
Chloride: 103 mmol/L (ref 98–110)
Creat: 0.98 mg/dL (ref 0.50–0.99)
GLUCOSE: 151 mg/dL — AB (ref 70–99)
POTASSIUM: 4.8 mmol/L (ref 3.5–5.3)
SODIUM: 139 mmol/L (ref 135–146)

## 2015-02-10 ENCOUNTER — Encounter (HOSPITAL_COMMUNITY): Payer: Self-pay | Admitting: Emergency Medicine

## 2015-02-10 ENCOUNTER — Inpatient Hospital Stay (HOSPITAL_COMMUNITY)
Admission: EM | Admit: 2015-02-10 | Discharge: 2015-02-12 | DRG: 312 | Disposition: A | Discharge status: Home Health | Payer: Medicare Other | Attending: Internal Medicine | Admitting: Internal Medicine

## 2015-02-10 ENCOUNTER — Emergency Department (HOSPITAL_COMMUNITY): Payer: Medicare Other

## 2015-02-10 DIAGNOSIS — W19XXXA Unspecified fall, initial encounter: Secondary | ICD-10-CM | POA: Diagnosis present

## 2015-02-10 DIAGNOSIS — I2510 Atherosclerotic heart disease of native coronary artery without angina pectoris: Secondary | ICD-10-CM | POA: Diagnosis present

## 2015-02-10 DIAGNOSIS — K219 Gastro-esophageal reflux disease without esophagitis: Secondary | ICD-10-CM | POA: Diagnosis present

## 2015-02-10 DIAGNOSIS — S022XXA Fracture of nasal bones, initial encounter for closed fracture: Secondary | ICD-10-CM | POA: Diagnosis not present

## 2015-02-10 DIAGNOSIS — IMO0002 Reserved for concepts with insufficient information to code with codable children: Secondary | ICD-10-CM

## 2015-02-10 DIAGNOSIS — R55 Syncope and collapse: Principal | ICD-10-CM | POA: Diagnosis present

## 2015-02-10 DIAGNOSIS — Z882 Allergy status to sulfonamides status: Secondary | ICD-10-CM

## 2015-02-10 DIAGNOSIS — S0083XA Contusion of other part of head, initial encounter: Secondary | ICD-10-CM | POA: Diagnosis not present

## 2015-02-10 DIAGNOSIS — M1990 Unspecified osteoarthritis, unspecified site: Secondary | ICD-10-CM | POA: Diagnosis present

## 2015-02-10 DIAGNOSIS — Z7982 Long term (current) use of aspirin: Secondary | ICD-10-CM

## 2015-02-10 DIAGNOSIS — C50919 Malignant neoplasm of unspecified site of unspecified female breast: Secondary | ICD-10-CM | POA: Diagnosis present

## 2015-02-10 DIAGNOSIS — Z96653 Presence of artificial knee joint, bilateral: Secondary | ICD-10-CM | POA: Diagnosis present

## 2015-02-10 DIAGNOSIS — Z23 Encounter for immunization: Secondary | ICD-10-CM

## 2015-02-10 DIAGNOSIS — Z6837 Body mass index (BMI) 37.0-37.9, adult: Secondary | ICD-10-CM

## 2015-02-10 DIAGNOSIS — E785 Hyperlipidemia, unspecified: Secondary | ICD-10-CM | POA: Diagnosis present

## 2015-02-10 DIAGNOSIS — R42 Dizziness and giddiness: Secondary | ICD-10-CM | POA: Diagnosis not present

## 2015-02-10 DIAGNOSIS — E1149 Type 2 diabetes mellitus with other diabetic neurological complication: Secondary | ICD-10-CM | POA: Diagnosis present

## 2015-02-10 DIAGNOSIS — E559 Vitamin D deficiency, unspecified: Secondary | ICD-10-CM | POA: Diagnosis present

## 2015-02-10 DIAGNOSIS — Z79899 Other long term (current) drug therapy: Secondary | ICD-10-CM

## 2015-02-10 DIAGNOSIS — N390 Urinary tract infection, site not specified: Secondary | ICD-10-CM | POA: Diagnosis present

## 2015-02-10 DIAGNOSIS — Z9049 Acquired absence of other specified parts of digestive tract: Secondary | ICD-10-CM | POA: Diagnosis present

## 2015-02-10 DIAGNOSIS — Y92002 Bathroom of unspecified non-institutional (private) residence single-family (private) house as the place of occurrence of the external cause: Secondary | ICD-10-CM

## 2015-02-10 DIAGNOSIS — E1165 Type 2 diabetes mellitus with hyperglycemia: Secondary | ICD-10-CM | POA: Diagnosis not present

## 2015-02-10 DIAGNOSIS — F329 Major depressive disorder, single episode, unspecified: Secondary | ICD-10-CM | POA: Diagnosis present

## 2015-02-10 DIAGNOSIS — Z803 Family history of malignant neoplasm of breast: Secondary | ICD-10-CM

## 2015-02-10 DIAGNOSIS — R040 Epistaxis: Secondary | ICD-10-CM | POA: Diagnosis not present

## 2015-02-10 DIAGNOSIS — T148XXA Other injury of unspecified body region, initial encounter: Secondary | ICD-10-CM

## 2015-02-10 DIAGNOSIS — E1365 Other specified diabetes mellitus with hyperglycemia: Secondary | ICD-10-CM | POA: Diagnosis present

## 2015-02-10 DIAGNOSIS — N3281 Overactive bladder: Secondary | ICD-10-CM | POA: Diagnosis present

## 2015-02-10 DIAGNOSIS — E669 Obesity, unspecified: Secondary | ICD-10-CM | POA: Diagnosis present

## 2015-02-10 DIAGNOSIS — R404 Transient alteration of awareness: Secondary | ICD-10-CM | POA: Diagnosis not present

## 2015-02-10 DIAGNOSIS — F419 Anxiety disorder, unspecified: Secondary | ICD-10-CM | POA: Diagnosis present

## 2015-02-10 DIAGNOSIS — Z794 Long term (current) use of insulin: Secondary | ICD-10-CM

## 2015-02-10 DIAGNOSIS — Z96642 Presence of left artificial hip joint: Secondary | ICD-10-CM | POA: Diagnosis present

## 2015-02-10 DIAGNOSIS — E875 Hyperkalemia: Secondary | ICD-10-CM | POA: Diagnosis present

## 2015-02-10 DIAGNOSIS — Z9012 Acquired absence of left breast and nipple: Secondary | ICD-10-CM | POA: Diagnosis present

## 2015-02-10 DIAGNOSIS — I10 Essential (primary) hypertension: Secondary | ICD-10-CM | POA: Diagnosis present

## 2015-02-10 DIAGNOSIS — G4733 Obstructive sleep apnea (adult) (pediatric): Secondary | ICD-10-CM | POA: Diagnosis present

## 2015-02-10 LAB — CBC WITH DIFFERENTIAL/PLATELET
BASOS ABS: 0 10*3/uL (ref 0.0–0.1)
Basophils Relative: 0 % (ref 0–1)
EOS PCT: 5 % (ref 0–5)
Eosinophils Absolute: 0.3 10*3/uL (ref 0.0–0.7)
HCT: 34.9 % — ABNORMAL LOW (ref 36.0–46.0)
Hemoglobin: 11.3 g/dL — ABNORMAL LOW (ref 12.0–15.0)
LYMPHS PCT: 22 % (ref 12–46)
Lymphs Abs: 1.5 10*3/uL (ref 0.7–4.0)
MCH: 29.3 pg (ref 26.0–34.0)
MCHC: 32.4 g/dL (ref 30.0–36.0)
MCV: 90.4 fL (ref 78.0–100.0)
MONO ABS: 0.7 10*3/uL (ref 0.1–1.0)
MONOS PCT: 10 % (ref 3–12)
Neutro Abs: 4.2 10*3/uL (ref 1.7–7.7)
Neutrophils Relative %: 63 % (ref 43–77)
PLATELETS: 235 10*3/uL (ref 150–400)
RBC: 3.86 MIL/uL — ABNORMAL LOW (ref 3.87–5.11)
RDW: 14.5 % (ref 11.5–15.5)
WBC: 6.7 10*3/uL (ref 4.0–10.5)

## 2015-02-10 LAB — BASIC METABOLIC PANEL
ANION GAP: 7 (ref 5–15)
BUN: 27 mg/dL — AB (ref 6–20)
CALCIUM: 9.4 mg/dL (ref 8.9–10.3)
CO2: 23 mmol/L (ref 22–32)
CREATININE: 1.06 mg/dL — AB (ref 0.44–1.00)
Chloride: 106 mmol/L (ref 101–111)
GFR calc Af Amer: >60 mL/min (ref >60)
GFR, EST NON AFRICAN AMERICAN: 52 mL/min — AB (ref >60)
GLUCOSE: 244 mg/dL — AB (ref 65–99)
Potassium: 5.5 mmol/L — ABNORMAL HIGH (ref 3.5–5.1)
Sodium: 136 mmol/L (ref 135–145)

## 2015-02-10 LAB — URINALYSIS, ROUTINE W REFLEX MICROSCOPIC
BILIRUBIN URINE: NEGATIVE
GLUCOSE, UA: 500 mg/dL — AB
HGB URINE DIPSTICK: NEGATIVE
KETONES UR: NEGATIVE mg/dL
NITRITE: NEGATIVE
PH: 5.5 (ref 5.0–8.0)
Protein, ur: NEGATIVE mg/dL
SPECIFIC GRAVITY, URINE: 1.018 (ref 1.005–1.030)
Urobilinogen, UA: 0.2 mg/dL (ref 0.0–1.0)

## 2015-02-10 LAB — URINE MICROSCOPIC-ADD ON

## 2015-02-10 MED ORDER — FENTANYL CITRATE (PF) 100 MCG/2ML IJ SOLN
50.0000 ug | Freq: Once | INTRAMUSCULAR | Status: AC
  Administered 2015-02-10: 50 ug via INTRAVENOUS
  Filled 2015-02-10: qty 2

## 2015-02-10 MED ORDER — HYDROCODONE-ACETAMINOPHEN 5-325 MG PO TABS
1.0000 | ORAL_TABLET | Freq: Once | ORAL | Status: AC
  Administered 2015-02-10: 1 via ORAL
  Filled 2015-02-10: qty 1

## 2015-02-10 MED ORDER — CEPHALEXIN 250 MG PO CAPS
500.0000 mg | ORAL_CAPSULE | Freq: Once | ORAL | Status: AC
  Administered 2015-02-10: 500 mg via ORAL
  Filled 2015-02-10: qty 2

## 2015-02-10 NOTE — ED Notes (Signed)
Pt here via EMS for eval of syncopal episode while on the toilet. Pt hit her nose when she fell, ems noted epistaxis on scene, none at this time. Pt a/o x 4, but has 9/10 HA. Pt also reporting blurred vision, med back pain. CBG 337 with EMS. Pt denies anticoagulants.

## 2015-02-11 ENCOUNTER — Inpatient Hospital Stay (HOSPITAL_COMMUNITY): Payer: Medicare Other

## 2015-02-11 ENCOUNTER — Observation Stay (HOSPITAL_COMMUNITY): Payer: Medicare Other

## 2015-02-11 ENCOUNTER — Encounter (HOSPITAL_COMMUNITY): Payer: Self-pay

## 2015-02-11 DIAGNOSIS — R55 Syncope and collapse: Secondary | ICD-10-CM | POA: Diagnosis not present

## 2015-02-11 DIAGNOSIS — Z803 Family history of malignant neoplasm of breast: Secondary | ICD-10-CM | POA: Diagnosis not present

## 2015-02-11 DIAGNOSIS — F329 Major depressive disorder, single episode, unspecified: Secondary | ICD-10-CM | POA: Diagnosis present

## 2015-02-11 DIAGNOSIS — C50919 Malignant neoplasm of unspecified site of unspecified female breast: Secondary | ICD-10-CM | POA: Diagnosis present

## 2015-02-11 DIAGNOSIS — Z9012 Acquired absence of left breast and nipple: Secondary | ICD-10-CM | POA: Diagnosis present

## 2015-02-11 DIAGNOSIS — E785 Hyperlipidemia, unspecified: Secondary | ICD-10-CM | POA: Diagnosis present

## 2015-02-11 DIAGNOSIS — E669 Obesity, unspecified: Secondary | ICD-10-CM | POA: Diagnosis present

## 2015-02-11 DIAGNOSIS — E1365 Other specified diabetes mellitus with hyperglycemia: Secondary | ICD-10-CM | POA: Diagnosis present

## 2015-02-11 DIAGNOSIS — N3281 Overactive bladder: Secondary | ICD-10-CM | POA: Diagnosis present

## 2015-02-11 DIAGNOSIS — S79911A Unspecified injury of right hip, initial encounter: Secondary | ICD-10-CM | POA: Diagnosis not present

## 2015-02-11 DIAGNOSIS — N390 Urinary tract infection, site not specified: Secondary | ICD-10-CM | POA: Diagnosis not present

## 2015-02-11 DIAGNOSIS — M1990 Unspecified osteoarthritis, unspecified site: Secondary | ICD-10-CM | POA: Diagnosis present

## 2015-02-11 DIAGNOSIS — K219 Gastro-esophageal reflux disease without esophagitis: Secondary | ICD-10-CM | POA: Diagnosis present

## 2015-02-11 DIAGNOSIS — E875 Hyperkalemia: Secondary | ICD-10-CM | POA: Diagnosis not present

## 2015-02-11 DIAGNOSIS — Z7982 Long term (current) use of aspirin: Secondary | ICD-10-CM | POA: Diagnosis not present

## 2015-02-11 DIAGNOSIS — E559 Vitamin D deficiency, unspecified: Secondary | ICD-10-CM | POA: Diagnosis present

## 2015-02-11 DIAGNOSIS — E1165 Type 2 diabetes mellitus with hyperglycemia: Secondary | ICD-10-CM | POA: Diagnosis present

## 2015-02-11 DIAGNOSIS — Z96642 Presence of left artificial hip joint: Secondary | ICD-10-CM | POA: Diagnosis present

## 2015-02-11 DIAGNOSIS — S022XXA Fracture of nasal bones, initial encounter for closed fracture: Secondary | ICD-10-CM | POA: Diagnosis present

## 2015-02-11 DIAGNOSIS — Z882 Allergy status to sulfonamides status: Secondary | ICD-10-CM | POA: Diagnosis not present

## 2015-02-11 DIAGNOSIS — I10 Essential (primary) hypertension: Secondary | ICD-10-CM | POA: Diagnosis not present

## 2015-02-11 DIAGNOSIS — E1149 Type 2 diabetes mellitus with other diabetic neurological complication: Secondary | ICD-10-CM | POA: Diagnosis present

## 2015-02-11 DIAGNOSIS — Z6837 Body mass index (BMI) 37.0-37.9, adult: Secondary | ICD-10-CM | POA: Diagnosis not present

## 2015-02-11 DIAGNOSIS — Z794 Long term (current) use of insulin: Secondary | ICD-10-CM | POA: Diagnosis not present

## 2015-02-11 DIAGNOSIS — R0789 Other chest pain: Secondary | ICD-10-CM | POA: Diagnosis not present

## 2015-02-11 DIAGNOSIS — S299XXA Unspecified injury of thorax, initial encounter: Secondary | ICD-10-CM | POA: Diagnosis not present

## 2015-02-11 DIAGNOSIS — Y92002 Bathroom of unspecified non-institutional (private) residence single-family (private) house as the place of occurrence of the external cause: Secondary | ICD-10-CM | POA: Diagnosis not present

## 2015-02-11 DIAGNOSIS — Z96653 Presence of artificial knee joint, bilateral: Secondary | ICD-10-CM | POA: Diagnosis present

## 2015-02-11 DIAGNOSIS — Z79899 Other long term (current) drug therapy: Secondary | ICD-10-CM | POA: Diagnosis not present

## 2015-02-11 DIAGNOSIS — M25551 Pain in right hip: Secondary | ICD-10-CM | POA: Diagnosis not present

## 2015-02-11 DIAGNOSIS — Z23 Encounter for immunization: Secondary | ICD-10-CM | POA: Diagnosis not present

## 2015-02-11 DIAGNOSIS — F419 Anxiety disorder, unspecified: Secondary | ICD-10-CM | POA: Diagnosis present

## 2015-02-11 DIAGNOSIS — G4733 Obstructive sleep apnea (adult) (pediatric): Secondary | ICD-10-CM | POA: Diagnosis present

## 2015-02-11 DIAGNOSIS — I2510 Atherosclerotic heart disease of native coronary artery without angina pectoris: Secondary | ICD-10-CM | POA: Diagnosis present

## 2015-02-11 DIAGNOSIS — W19XXXA Unspecified fall, initial encounter: Secondary | ICD-10-CM | POA: Diagnosis not present

## 2015-02-11 DIAGNOSIS — Z9049 Acquired absence of other specified parts of digestive tract: Secondary | ICD-10-CM | POA: Diagnosis present

## 2015-02-11 LAB — CBC
HCT: 35.1 % — ABNORMAL LOW (ref 36.0–46.0)
Hemoglobin: 10.9 g/dL — ABNORMAL LOW (ref 12.0–15.0)
MCH: 28.2 pg (ref 26.0–34.0)
MCHC: 31.1 g/dL (ref 30.0–36.0)
MCV: 90.9 fL (ref 78.0–100.0)
PLATELETS: 230 10*3/uL (ref 150–400)
RBC: 3.86 MIL/uL — AB (ref 3.87–5.11)
RDW: 14.7 % (ref 11.5–15.5)
WBC: 8.2 10*3/uL (ref 4.0–10.5)

## 2015-02-11 LAB — GLUCOSE, CAPILLARY
GLUCOSE-CAPILLARY: 222 mg/dL — AB (ref 65–99)
Glucose-Capillary: 140 mg/dL — ABNORMAL HIGH (ref 65–99)
Glucose-Capillary: 290 mg/dL — ABNORMAL HIGH (ref 65–99)
Glucose-Capillary: 277 mg/dL — ABNORMAL HIGH (ref 65–99)

## 2015-02-11 LAB — BASIC METABOLIC PANEL
Anion gap: 8 (ref 5–15)
BUN: 19 mg/dL (ref 6–20)
CALCIUM: 8.9 mg/dL (ref 8.9–10.3)
CO2: 25 mmol/L (ref 22–32)
CREATININE: 0.92 mg/dL (ref 0.44–1.00)
Chloride: 104 mmol/L (ref 101–111)
GFR calc non Af Amer: >60 mL/min (ref >60)
Glucose, Bld: 149 mg/dL — ABNORMAL HIGH (ref 65–99)
Potassium: 5.2 mmol/L — ABNORMAL HIGH (ref 3.5–5.1)
Sodium: 137 mmol/L (ref 135–145)

## 2015-02-11 LAB — TSH: TSH: 1.834 u[IU]/mL (ref 0.350–4.500)

## 2015-02-11 MED ORDER — ATORVASTATIN CALCIUM 40 MG PO TABS
40.0000 mg | ORAL_TABLET | Freq: Every day | ORAL | Status: DC
  Administered 2015-02-11: 40 mg via ORAL
  Filled 2015-02-11: qty 1

## 2015-02-11 MED ORDER — DOCUSATE SODIUM 100 MG PO CAPS
100.0000 mg | ORAL_CAPSULE | Freq: Every day | ORAL | Status: DC
  Administered 2015-02-11 – 2015-02-12 (×2): 100 mg via ORAL
  Filled 2015-02-11 (×2): qty 1

## 2015-02-11 MED ORDER — FLUOXETINE HCL 20 MG PO CAPS
40.0000 mg | ORAL_CAPSULE | Freq: Every day | ORAL | Status: DC
  Administered 2015-02-11 – 2015-02-12 (×2): 40 mg via ORAL
  Filled 2015-02-11 (×2): qty 2

## 2015-02-11 MED ORDER — ENOXAPARIN SODIUM 40 MG/0.4ML ~~LOC~~ SOLN
40.0000 mg | SUBCUTANEOUS | Status: DC
  Administered 2015-02-11: 40 mg via SUBCUTANEOUS
  Filled 2015-02-11: qty 0.4

## 2015-02-11 MED ORDER — EZETIMIBE 10 MG PO TABS
10.0000 mg | ORAL_TABLET | Freq: Every day | ORAL | Status: DC
  Administered 2015-02-11 – 2015-02-12 (×2): 10 mg via ORAL
  Filled 2015-02-11 (×2): qty 1

## 2015-02-11 MED ORDER — INSULIN ASPART 100 UNIT/ML ~~LOC~~ SOLN
0.0000 [IU] | Freq: Three times a day (TID) | SUBCUTANEOUS | Status: DC
  Administered 2015-02-11: 1 [IU] via SUBCUTANEOUS
  Administered 2015-02-11: 5 [IU] via SUBCUTANEOUS
  Administered 2015-02-11 – 2015-02-12 (×2): 3 [IU] via SUBCUTANEOUS

## 2015-02-11 MED ORDER — SODIUM CHLORIDE 0.45 % IV SOLN: INTRAVENOUS | Status: AC

## 2015-02-11 MED ORDER — PANTOPRAZOLE SODIUM 40 MG PO TBEC
40.0000 mg | DELAYED_RELEASE_TABLET | Freq: Every day | ORAL | Status: DC
  Administered 2015-02-11 – 2015-02-12 (×2): 40 mg via ORAL
  Filled 2015-02-11 (×2): qty 1

## 2015-02-11 MED ORDER — INFLUENZA VAC SPLIT QUAD 0.5 ML IM SUSY
0.5000 mL | PREFILLED_SYRINGE | INTRAMUSCULAR | Status: AC
  Administered 2015-02-12: 0.5 mL via INTRAMUSCULAR
  Filled 2015-02-11: qty 0.5

## 2015-02-11 MED ORDER — ACETAMINOPHEN 325 MG PO TABS
650.0000 mg | ORAL_TABLET | Freq: Four times a day (QID) | ORAL | Status: DC | PRN
  Administered 2015-02-11 – 2015-02-12 (×3): 650 mg via ORAL
  Filled 2015-02-11 (×3): qty 2

## 2015-02-11 MED ORDER — EXEMESTANE 25 MG PO TABS
25.0000 mg | ORAL_TABLET | Freq: Every day | ORAL | Status: DC
  Administered 2015-02-11 – 2015-02-12 (×2): 25 mg via ORAL
  Filled 2015-02-11 (×3): qty 1

## 2015-02-11 MED ORDER — SODIUM CHLORIDE 0.9 % IV BOLUS (SEPSIS)
1000.0000 mL | Freq: Once | INTRAVENOUS | Status: AC
  Administered 2015-02-11: 1000 mL via INTRAVENOUS

## 2015-02-11 MED ORDER — DEXTROSE 5 % IV SOLN
1.0000 g | INTRAVENOUS | Status: DC
  Administered 2015-02-11 – 2015-02-12 (×2): 1 g via INTRAVENOUS
  Filled 2015-02-11 (×2): qty 10

## 2015-02-11 MED ORDER — INSULIN ASPART 100 UNIT/ML ~~LOC~~ SOLN
0.0000 [IU] | Freq: Every day | SUBCUTANEOUS | Status: DC
  Administered 2015-02-11: 3 [IU] via SUBCUTANEOUS

## 2015-02-11 MED ORDER — OXYBUTYNIN CHLORIDE 5 MG PO TABS
5.0000 mg | ORAL_TABLET | Freq: Two times a day (BID) | ORAL | Status: DC
  Administered 2015-02-11 – 2015-02-12 (×3): 5 mg via ORAL
  Filled 2015-02-11 (×3): qty 1

## 2015-02-11 MED ORDER — ONDANSETRON HCL 4 MG PO TABS: 4.0000 mg | ORAL_TABLET | Freq: Four times a day (QID) | ORAL | Status: DC | PRN

## 2015-02-11 MED ORDER — METFORMIN HCL 500 MG PO TABS: 1000.0000 mg | ORAL_TABLET | Freq: Two times a day (BID) | ORAL | Status: DC

## 2015-02-11 MED ORDER — SODIUM CHLORIDE 0.45 % IV SOLN
INTRAVENOUS | Status: DC
  Administered 2015-02-11 (×2): via INTRAVENOUS

## 2015-02-11 MED ORDER — INSULIN GLARGINE 100 UNIT/ML ~~LOC~~ SOLN
40.0000 [IU] | Freq: Every day | SUBCUTANEOUS | Status: DC
  Administered 2015-02-11: 40 [IU] via SUBCUTANEOUS
  Filled 2015-02-11 (×3): qty 0.4

## 2015-02-11 MED ORDER — SODIUM CHLORIDE 0.9 % IJ SOLN
3.0000 mL | Freq: Two times a day (BID) | INTRAMUSCULAR | Status: DC
  Administered 2015-02-11 (×2): 3 mL via INTRAVENOUS

## 2015-02-11 MED ORDER — GABAPENTIN 300 MG PO CAPS
300.0000 mg | ORAL_CAPSULE | Freq: Three times a day (TID) | ORAL | Status: DC
  Administered 2015-02-11 – 2015-02-12 (×4): 300 mg via ORAL
  Filled 2015-02-11 (×4): qty 1

## 2015-02-11 MED ORDER — SODIUM POLYSTYRENE SULFONATE 15 GM/60ML PO SUSP
30.0000 g | Freq: Once | ORAL | Status: AC
  Administered 2015-02-11: 30 g via ORAL
  Filled 2015-02-11: qty 120

## 2015-02-11 MED ORDER — ASPIRIN EC 81 MG PO TBEC
81.0000 mg | DELAYED_RELEASE_TABLET | Freq: Every day | ORAL | Status: DC
  Administered 2015-02-11 – 2015-02-12 (×2): 81 mg via ORAL
  Filled 2015-02-11 (×2): qty 1

## 2015-02-11 MED ORDER — ACETAMINOPHEN 650 MG RE SUPP: 650.0000 mg | Freq: Four times a day (QID) | RECTAL | Status: DC | PRN

## 2015-02-11 MED ORDER — ONDANSETRON HCL 4 MG/2ML IJ SOLN: 4.0000 mg | Freq: Four times a day (QID) | INTRAMUSCULAR | Status: DC | PRN

## 2015-02-11 MED ORDER — BISACODYL 10 MG RE SUPP: 10.0000 mg | Freq: Every day | RECTAL | Status: DC | PRN

## 2015-02-11 NOTE — ED Notes (Signed)
Patient transported to X-ray 

## 2015-02-11 NOTE — ED Provider Notes (Addendum)
CSN: [id number]     Arrival date & time 02/10/15  1539 History   First MD Initiated Contact with Patient 02/10/15 1542     Chief Complaint  Patient presents with  . Loss of Consciousness     (Consider location/radiation/quality/duration/timing/severity/associated sxs/prior Treatment) HPI Comments: Pt comes in with cc of syncope. Pt has hx of CAD. She reports that she went to bathroom, tried to get up - got dizzy, and fainted. She woke up few minutes later, couldn't get up, and crawled to make a 911 call. She had no chest pain, dib prior to syncope. Does report some chest discomfort on the L side prior to ER arrival. Pt had bleeding post fall from her nose, which has now ceased. She has nasal pain, headache. Denies pain elsewhere.  Not on anticoagulants.  Patient is a 69 y.o. female presenting with syncope. The history is provided by the patient.  Loss of Consciousness Associated symptoms: dizziness   Associated symptoms: no chest pain, no headaches, no nausea, no shortness of breath and no vomiting     Past Medical History  Diagnosis Date  . Diabetes mellitus   . Coronary artery disease, non-occlusive     cath 2010 by Dr Lia Foyer revealed scattered nonobstrructive disease with preserved EF  . Hyperlipidemia   . Allergy   . Anxiety   . Depression   . Arthritis   . Hypertension   . Hemorrhoids   . Obesity   . Overactive bladder   . Vitamin D deficiency   . GERD (gastroesophageal reflux disease)   . Cancer     breast   Past Surgical History  Procedure Laterality Date  . Oophorectomy Right     1.5  ovaries rem  . Replacement total knee      both knees  . Back surgery    . Partial hip arthroplasty      left hip  . Cholecystectomy, laparoscopic    . Lithotripsy    . Cesarean section    . Joint replacement    . Cholecystectomy    . Tonsillectomy    . Appendectomy    . Cardiac catheterization  01/2009    cath by Dr Lia Foyer revealed nonobstructive CAD  . Total  mastectomy Left 12/24/2012    Dr Donne Hazel  . Mastectomy w/ sentinel node biopsy Left 12/24/2012    Procedure: LEFT MASTECTOMY WITH LEFT SENTINEL LYMPH NODE BIOPSY;  Surgeon: Rolm Bookbinder, MD;  Location: [facility];  Service: General;  Laterality: Left;  . Breast surgery     Family History  Problem Relation Age of Onset  . Cancer Mother     mouth cancer  . Breast cancer Mother     possibly dx in her 77s  . Lung cancer Brother 33  . Lung cancer Brother   . Lung cancer Brother   . Heart attack Father 22  . Brain cancer Brother    Social History  Substance Use Topics  . Smoking status: Never Smoker   . Smokeless tobacco: Never Used  . Alcohol Use: No   OB History    No data available     Review of Systems  Constitutional: Positive for activity change.  Respiratory: Negative for shortness of breath.   Cardiovascular: Positive for syncope. Negative for chest pain.  Gastrointestinal: Negative for nausea, vomiting and abdominal pain.  Genitourinary: Negative for dysuria.  Musculoskeletal: Negative for neck pain.  Skin: Positive for rash and wound.  Neurological: Positive for dizziness and syncope. Negative  for headaches.      Allergies  Sulfonamide derivatives  Home Medications   Prior to Admission medications   Medication Sig Start Date End Date Taking? Authorizing Provider  aspirin EC 81 MG tablet Take 81 mg by mouth daily.   Yes Historical Provider, MD  atorvastatin (LIPITOR) 40 MG tablet Take 1 tablet (40 mg total) by mouth daily at 6 PM. 12/29/14  Yes Alycia Rossetti, MD  Docusate Calcium (STOOL SOFTENER PO) Take 2 capsules by mouth every evening.    Yes Historical Provider, MD  exemestane (AROMASIN) 25 MG tablet Take 1 tablet (25 mg total) by mouth daily after breakfast. 10/24/14  Yes Alycia Rossetti, MD  ezetimibe (ZETIA) 10 MG tablet Take 1 tablet (10 mg total) by mouth daily. 12/29/14  Yes Alycia Rossetti, MD  FLUoxetine (PROZAC) 40 MG capsule Take 1 capsule (40  mg total) by mouth daily. 12/29/14  Yes Alycia Rossetti, MD  gabapentin (NEURONTIN) 300 MG capsule Take 1 capsule (300 mg total) by mouth 3 (three) times daily. 12/29/14  Yes Alycia Rossetti, MD  ibuprofen (ADVIL,MOTRIN) 200 MG tablet Take 200 mg by mouth every 6 (six) hours as needed for pain.   Yes Historical Provider, MD  insulin glargine (LANTUS) 100 unit/mL SOPN Inject 0.4 mLs (40 Units total) into the skin at bedtime. 12/01/14  Yes Alycia Rossetti, MD  lisinopril (PRINIVIL,ZESTRIL) 10 MG tablet Take 1 tablet (10 mg total) by mouth daily. 12/29/14  Yes Alycia Rossetti, MD  metFORMIN (GLUCOPHAGE) 1000 MG tablet Take 1 tablet (1,000 mg total) by mouth 2 (two) times daily with a meal. 12/29/14  Yes Alycia Rossetti, MD  omeprazole (PRILOSEC) 20 MG capsule Take 1 capsule (20 mg total) by mouth 2 (two) times daily. 12/29/14  Yes Alycia Rossetti, MD  oxybutynin (DITROPAN) 5 MG tablet Take 1 tablet (5 mg total) by mouth 2 (two) times daily. 12/29/14  Yes Alycia Rossetti, MD  blood glucose meter kit and supplies KIT Dispense based on patient and insurance preference. Use to monitor FSBS 3x daily. Dx: E11.41. 12/29/14   Alycia Rossetti, MD  glucose blood test strip Dispense based on patient and insurance preference. Use to monitor FSBS 3x daily. Dx: J47.82. 12/29/14   Alycia Rossetti, MD  Lancets Misc. KIT Dispense based on patient and insurance preference. Use to monitor FSBS 3x daily. Dx: E11.41. 12/29/14   Alycia Rossetti, MD  Lancets MISC Dispense based on patient and insurance preference. Use to monitor FSBS 3x daily. Dx: E11.41. 12/29/14   Alycia Rossetti, MD  sodium polystyrene (KAYEXALATE) powder Take 30g by mouth x1 dose for elevated potassium. Patient not taking: Reported on 02/10/2015 12/31/14   Alycia Rossetti, MD   BP 113/61 mmHg  Pulse 64  Temp(Src) 99 F (37.2 C) (Oral)  Resp 19  SpO2 96% Physical Exam  Constitutional: She is oriented to person, place, and time. She appears  well-developed and well-nourished.  HENT:  Head: Normocephalic and atraumatic.  Eyes: EOM are normal. Pupils are equal, round, and reactive to light.  Nasal swelling, with edema. Facial ecchymoses, worse around the left eye.  Neck: Neck supple.  Cardiovascular: Normal rate, regular rhythm and normal heart sounds.   Pulmonary/Chest: Effort normal. No respiratory distress.  Abdominal: Soft. She exhibits no distension. There is no tenderness. There is no rebound and no guarding.  Neurological: She is alert and oriented to person, place, and time. No cranial nerve deficit.  Coordination normal.  Skin: Skin is warm and dry. Rash noted.  Nursing note and vitals reviewed.   ED Course  Procedures (including critical care time) Labs Review Labs Reviewed  CBC WITH DIFFERENTIAL/PLATELET - Abnormal; Notable for the following:    RBC 3.86 (*)    Hemoglobin 11.3 (*)    HCT 34.9 (*)    All other components within normal limits  BASIC METABOLIC PANEL - Abnormal; Notable for the following:    Potassium 5.5 (*)    Glucose, Bld 244 (*)    BUN 27 (*)    Creatinine, Ser 1.06 (*)    GFR calc non Af Amer 52 (*)    All other components within normal limits  URINALYSIS, ROUTINE W REFLEX MICROSCOPIC (NOT AT [facility]) - Abnormal; Notable for the following:    APPearance HAZY (*)    Glucose, UA 500 (*)    Leukocytes, UA LARGE (*)    All other components within normal limits  URINE MICROSCOPIC-ADD ON - Abnormal; Notable for the following:    Squamous Epithelial / LPF FEW (*)    Bacteria, UA FEW (*)    All other components within normal limits  URINE CULTURE    Imaging Review Ct Head Wo Contrast  02/10/2015   CLINICAL DATA:  Fall in bathroom with head injury, loss of consciousness, frontal headaches and periorbital pain and swelling. Initial encounter.  EXAM: CT HEAD WITHOUT CONTRAST  CT MAXILLOFACIAL WITHOUT CONTRAST  TECHNIQUE: Multidetector CT imaging of the head and maxillofacial structures were  performed using the standard protocol without intravenous contrast. Multiplanar CT image reconstructions of the maxillofacial structures were also generated.  COMPARISON:  CT 11/29/2010.  FINDINGS: CT HEAD FINDINGS  There is no evidence of acute intracranial hemorrhage, mass lesion, brain edema or extra-axial fluid collection. The ventricles and subarachnoid spaces are appropriately sized for age. There is no CT evidence of acute cortical infarction.  The mastoid air cells and middle ears are clear. Facial findings described below. The calvarium is intact.  CT MAXILLOFACIAL FINDINGS  There are acute mildly displaced bilateral nasal bone fractures. There is also a fracture of the bony nasal septum. The orbital walls and zygomatic arches are intact. The pterygoid plates are intact. No other facial fractures identified.  There is layering blood in the ethmoid, maxillary and sphenoid sinuses bilaterally. There is soft tissue swelling around the nose extending into the anterior orbits. There is no orbital hematoma or evidence of globe injury. The optic nerves and extra-ocular muscles are intact. The mandible and TMJs are intact.  IMPRESSION: 1. Acute mildly displaced bilateral nasal bone and bony nasal septal fractures. 2. Associated soft tissue injury with preseptal orbital swelling. No evidence of globe injury. 3. No acute intracranial findings.  The calvarium is intact.   Electronically Signed   By: Richardean Sale M.D.   On: 02/10/2015 18:09   Ct Maxillofacial Wo Cm  02/10/2015   CLINICAL DATA:  Fall in bathroom with head injury, loss of consciousness, frontal headaches and periorbital pain and swelling. Initial encounter.  EXAM: CT HEAD WITHOUT CONTRAST  CT MAXILLOFACIAL WITHOUT CONTRAST  TECHNIQUE: Multidetector CT imaging of the head and maxillofacial structures were performed using the standard protocol without intravenous contrast. Multiplanar CT image reconstructions of the maxillofacial structures were also  generated.  COMPARISON:  CT 11/29/2010.  FINDINGS: CT HEAD FINDINGS  There is no evidence of acute intracranial hemorrhage, mass lesion, brain edema or extra-axial fluid collection. The ventricles and subarachnoid spaces are appropriately  sized for age. There is no CT evidence of acute cortical infarction.  The mastoid air cells and middle ears are clear. Facial findings described below. The calvarium is intact.  CT MAXILLOFACIAL FINDINGS  There are acute mildly displaced bilateral nasal bone fractures. There is also a fracture of the bony nasal septum. The orbital walls and zygomatic arches are intact. The pterygoid plates are intact. No other facial fractures identified.  There is layering blood in the ethmoid, maxillary and sphenoid sinuses bilaterally. There is soft tissue swelling around the nose extending into the anterior orbits. There is no orbital hematoma or evidence of globe injury. The optic nerves and extra-ocular muscles are intact. The mandible and TMJs are intact.  IMPRESSION: 1. Acute mildly displaced bilateral nasal bone and bony nasal septal fractures. 2. Associated soft tissue injury with preseptal orbital swelling. No evidence of globe injury. 3. No acute intracranial findings.  The calvarium is intact.   Electronically Signed   By: Richardean Sale M.D.   On: 02/10/2015 18:09   I have personally reviewed and evaluated these images and lab results as part of my medical decision-making.   EKG Interpretation   Date/Time:  Wednesday February 11 2015 01:48:02 EDT Ventricular Rate:  59 PR Interval:  233 QRS Duration: 140 QT Interval:  433 QTC Calculation: 429 R Axis:   -45 Text Interpretation:  Sinus rhythm Prolonged PR interval Right bundle  branch block Left ventricular hypertrophy No acute changes Confirmed by  Kathrynn Humble, MD, Thelma Comp [phone]) on 02/11/2015 1:59:07 AM      MDM   Final diagnoses:  Syncope and collapse  UTI (lower urinary tract infection)  Nasal fracture, closed,  initial encounter  Contusion    Pt comes in with cc of fall. She appeared to have had a vasovagal event/micturation syncope. No cardiac prodrome. She has a nasal fracture per CT. Pt ambulated- and she feels unsteady. Pt reports being very nervous about walking, especially since this is her 2nd fall in 1 week. Daughter doesn't feel comfortable taking pt home and lives 40 min away. Pt assessed x 2 times - and she still feels unsteady with gait-  But has a normal cerebellar exam. We will admit.    Varney Biles, MD 02/11/15 2952  Varney Biles, MD 02/11/15 8413

## 2015-02-11 NOTE — Progress Notes (Signed)
PATIENT DETAILS Name: Kristen [name] Age: 69 y.o. Sex: female Date of Birth: 12-27-1945 Admit Date: 02/10/2015 Admitting Physician Roney Jaffe, MD [id number], Lonell Grandchild, MD  Subjective: No complaints currently  Assessment/Plan: Active Problems: Syncope:occured while standing up from the [employer]-preceded by lightheadedness-Highly suspicious for Orthostatic mechanism, furthermore orthostatics vitals positive from lying to sitting.Telemetry neg, Await Echo. Cautiously hydrate, and hold Lisinopril. Recheck orthostatics in am.   IRS:WNIOEVOJ Rocephin, await urine culture. No fever/leukocytosis-but does endorse dysuria/frequency. Follow  Hyperkalemia:Kayexalate X 1, Continue to hold Lisinopril  DM-2:CBG's relatively stable for now-continue Lantus/SSI-hold Metformin while inpatient. Follow and adjust accordingly  Dyslipidemia:continue statin  Essential JKK:[id number] on hold for hyperkalemia-allow mild permissive HTN given orthostatic vitals this am  Breast ca - s/p L mastectomy:on aromatase inhibitor  Depression:denies suicidal/homicidal ideation-continue Prozac  Disposition: Remain inpatient-home in am  Antimicrobial agents  See below  Anti-infectives    Start     Dose/Rate Route Frequency Ordered Stop   02/11/15 0145  cefTRIAXone (ROCEPHIN) 1 g in dextrose 5 % 50 mL IVPB     1 g 100 mL/hr over 30 Minutes Intravenous Every 24 hours 02/11/15 0144 02/16/15 0144   02/10/15 2030  cephALEXin (KEFLEX) capsule 500 mg     500 mg Oral  Once 02/10/15 2029 02/10/15 2043      DVT Prophylaxis: Prophylactic Lovenox  Code Status: Full code   Family Communication None  Procedures: None  CONSULTS:  None  Time spent 30 minutes-Greater than 50% of this time was spent in counseling, explanation of diagnosis, planning of further management, and coordination of care.  MEDICATIONS: Scheduled Meds: . aspirin EC  81 mg Oral Daily  . atorvastatin  40 mg Oral  q1800  . cefTRIAXone (ROCEPHIN)  IV  1 g Intravenous Q24H  . docusate sodium  100 mg Oral Daily  . exemestane  25 mg Oral QPC breakfast  . ezetimibe  10 mg Oral Daily  . FLUoxetine  40 mg Oral Daily  . gabapentin  300 mg Oral TID  . [START ON 02/12/2015] Influenza vac split quadrivalent PF  0.5 mL Intramuscular Tomorrow-1000  . insulin aspart  0-5 Units Subcutaneous QHS  . insulin aspart  0-9 Units Subcutaneous TID WC  . insulin glargine  40 Units Subcutaneous QHS  . oxybutynin  5 mg Oral BID  . pantoprazole  40 mg Oral Daily  . sodium chloride  3 mL Intravenous Q12H   Continuous Infusions: . sodium chloride 75 mL/hr at 02/11/15 1326   PRN Meds:.acetaminophen **OR** acetaminophen, bisacodyl, ondansetron **OR** ondansetron (ZOFRAN) IV    PHYSICAL EXAM: Vital signs in last 24 hours: Filed Vitals:   02/11/15 0230 02/11/15 0559 02/11/15 0821 02/11/15 1156  BP: 145/69 158/84    Pulse: 58 62    Temp: 98.6 F (37 C) 97.9 F (36.6 C)    TempSrc: Oral Oral    Resp: 20 20    Height:    5\' 4"  (1.626 m)  Weight:    99.6 kg (219 lb 9.3 oz)  SpO2: 97% 100% 100%     Weight change:  Filed Weights   02/11/15 1156  Weight: 99.6 kg (219 lb 9.3 oz)   Body mass index is 37.67 kg/(m^2).   Gen Exam: Awake and alert with clear speech.   Neck: Supple, No JVD.   Chest: B/L Clear.   CVS: S1 S2 Regular, no murmurs.  Abdomen: soft, BS +, non tender, non  distended.  Extremities: no edema, lower extremities warm to touch. Neurologic: Non Focal.   Skin: No Rash.  Wounds: N/A.    Intake/Output from previous day:  Intake/Output Summary (Last 24 hours) at 02/11/15 1338 Last data filed at 02/11/15 1011  Gross per 24 hour  Intake 1214.25 ml  Output    900 ml  Net 314.25 ml     LAB RESULTS: CBC  Recent Labs Lab 02/10/15 1632 02/11/15 0551  WBC 6.7 8.2  HGB 11.3* 10.9*  HCT 34.9* 35.1*  PLT 235 230  MCV 90.4 90.9  MCH 29.3 28.2  MCHC 32.4 31.1  RDW 14.5 14.7  LYMPHSABS 1.5  --    MONOABS 0.7  --   EOSABS 0.3  --   BASOSABS 0.0  --     Chemistries   Recent Labs Lab 02/10/15 1632 02/11/15 0551  NA 136 137  K 5.5* 5.2*  CL 106 104  CO2 23 25  GLUCOSE 244* 149*  BUN 27* 19  CREATININE 1.06* 0.92  CALCIUM 9.4 8.9    CBG:  Recent Labs Lab 02/11/15 0748 02/11/15 1158  GLUCAP 140* 222*    GFR Estimated Creatinine Clearance: 65.3 mL/min (by C-G formula based on Cr of 0.92).  Coagulation profile No results for input(s): INR, PROTIME in the last 168 hours.  Cardiac Enzymes No results for input(s): CKMB, TROPONINI, MYOGLOBIN in the last 168 hours.  Invalid input(s): CK  Invalid input(s): POCBNP No results for input(s): DDIMER in the last 72 hours. No results for input(s): HGBA1C in the last 72 hours. No results for input(s): CHOL, HDL, LDLCALC, TRIG, CHOLHDL, LDLDIRECT in the last 72 hours.  Recent Labs  02/11/15 0551  TSH 1.834   No results for input(s): VITAMINB12, FOLATE, FERRITIN, TIBC, IRON, RETICCTPCT in the last 72 hours. No results for input(s): LIPASE, AMYLASE in the last 72 hours.  Urine Studies No results for input(s): UHGB, CRYS in the last 72 hours.  Invalid input(s): UACOL, UAPR, USPG, UPH, UTP, UGL, UKET, UBIL, UNIT, UROB, ULEU, UEPI, UWBC, URBC, UBAC, CAST, UCOM, BILUA  MICROBIOLOGY: No results found for this or any previous visit (from the past 240 hour(s)).  RADIOLOGY STUDIES/RESULTS: Dg Pelvis 1-2 Views  02/11/2015   CLINICAL DATA:  Right-sided hip pain after fall  EXAM: PELVIS - 1-2 VIEW  COMPARISON:  None.  FINDINGS: There is no evidence of pelvic fracture or diastasis. No pelvic bone lesions are seen. There are intact appearances of the partially imaged left total hip arthroplasty.  IMPRESSION: Negative for acute fracture.   Electronically Signed   By: Andreas Newport M.D.   On: 02/11/2015 02:48   Ct Head Wo Contrast  02/10/2015   CLINICAL DATA:  Fall in bathroom with head injury, loss of consciousness, frontal  headaches and periorbital pain and swelling. Initial encounter.  EXAM: CT HEAD WITHOUT CONTRAST  CT MAXILLOFACIAL WITHOUT CONTRAST  TECHNIQUE: Multidetector CT imaging of the head and maxillofacial structures were performed using the standard protocol without intravenous contrast. Multiplanar CT image reconstructions of the maxillofacial structures were also generated.  COMPARISON:  CT 11/29/2010.  FINDINGS: CT HEAD FINDINGS  There is no evidence of acute intracranial hemorrhage, mass lesion, brain edema or extra-axial fluid collection. The ventricles and subarachnoid spaces are appropriately sized for age. There is no CT evidence of acute cortical infarction.  The mastoid air cells and middle ears are clear. Facial findings described below. The calvarium is intact.  CT MAXILLOFACIAL FINDINGS  There are acute mildly  displaced bilateral nasal bone fractures. There is also a fracture of the bony nasal septum. The orbital walls and zygomatic arches are intact. The pterygoid plates are intact. No other facial fractures identified.  There is layering blood in the ethmoid, maxillary and sphenoid sinuses bilaterally. There is soft tissue swelling around the nose extending into the anterior orbits. There is no orbital hematoma or evidence of globe injury. The optic nerves and extra-ocular muscles are intact. The mandible and TMJs are intact.  IMPRESSION: 1. Acute mildly displaced bilateral nasal bone and bony nasal septal fractures. 2. Associated soft tissue injury with preseptal orbital swelling. No evidence of globe injury. 3. No acute intracranial findings.  The calvarium is intact.   Electronically Signed   By: Richardean Sale M.D.   On: 02/10/2015 18:09   Dg Chest Port 1 View  02/11/2015   CLINICAL DATA:  [place].  Left upper chest, chronic.  EXAM: PORTABLE CHEST - 1 VIEW  COMPARISON:  12/17/2012  FINDINGS: There is unchanged right hemidiaphragm elevation. The lungs are clear. Hilar, mediastinal and cardiac contours  are unremarkable. No displaced fractures are evident.  IMPRESSION: No acute findings.   Electronically Signed   By: Andreas Newport M.D.   On: 02/11/2015 03:01   Ct Maxillofacial Wo Cm  02/10/2015   CLINICAL DATA:  Fall in bathroom with head injury, loss of consciousness, frontal headaches and periorbital pain and swelling. Initial encounter.  EXAM: CT HEAD WITHOUT CONTRAST  CT MAXILLOFACIAL WITHOUT CONTRAST  TECHNIQUE: Multidetector CT imaging of the head and maxillofacial structures were performed using the standard protocol without intravenous contrast. Multiplanar CT image reconstructions of the maxillofacial structures were also generated.  COMPARISON:  CT 11/29/2010.  FINDINGS: CT HEAD FINDINGS  There is no evidence of acute intracranial hemorrhage, mass lesion, brain edema or extra-axial fluid collection. The ventricles and subarachnoid spaces are appropriately sized for age. There is no CT evidence of acute cortical infarction.  The mastoid air cells and middle ears are clear. Facial findings described below. The calvarium is intact.  CT MAXILLOFACIAL FINDINGS  There are acute mildly displaced bilateral nasal bone fractures. There is also a fracture of the bony nasal septum. The orbital walls and zygomatic arches are intact. The pterygoid plates are intact. No other facial fractures identified.  There is layering blood in the ethmoid, maxillary and sphenoid sinuses bilaterally. There is soft tissue swelling around the nose extending into the anterior orbits. There is no orbital hematoma or evidence of globe injury. The optic nerves and extra-ocular muscles are intact. The mandible and TMJs are intact.  IMPRESSION: 1. Acute mildly displaced bilateral nasal bone and bony nasal septal fractures. 2. Associated soft tissue injury with preseptal orbital swelling. No evidence of globe injury. 3. No acute intracranial findings.  The calvarium is intact.   Electronically Signed   By: Richardean Sale M.D.   On:  02/10/2015 18:09    Oren Binet, MD  [facility] Pager:[phone]  If 7PM-7AM, please contact night-coverage [email or web address] Password [email or web address] 02/11/2015, 1:38 PM   LOS: 0 days

## 2015-02-11 NOTE — Evaluation (Signed)
Physical Therapy Evaluation Patient Details Name: Kristen [name] MRN: [id number] DOB: 05-19-1946 Today's Date: 02/11/2015   History of Present Illness  Morrisa Aldaba is a 69 y.o. female with hx of back pain/ back surgery, atypical CP, pyelonephritis who presented after having a syncopal epidose while on the toilet. Fell and hit her nose. A/O x 4 on arrival but having 9/10 headache. Nose was bleeding on arrival but has since stopped. CT showed fractured nasal bones bilat but no other fractures.   Clinical Impression  Pt presents with dependencies in mobility secondary to orthostatic hypotension and decreased activity tolerance. Pt demonstrates good safety with RW, but has decreased endurance. Pt lives alone and feel she will be able to return home with her family checking in on her. Pt would benefit from skilled PT to maximize mobility and Independence for return home when medically stable.    Follow Up Recommendations Home health PT    Equipment Recommendations  None recommended by PT    Recommendations for Other Services       Precautions / Restrictions        Mobility  Bed Mobility Overal bed mobility: Modified Independent                Transfers Overall transfer level: Needs assistance Equipment used: Rolling walker (2 wheeled) Transfers: Sit to/from Stand Sit to Stand: Supervision            Ambulation/Gait Ambulation/Gait assistance: Supervision Ambulation Distance (Feet): 20 Feet Assistive device: Rolling walker (2 wheeled) Gait Pattern/deviations: Step-through pattern;Decreased stride length Gait velocity: decreased   General Gait Details: decreased hip flexion bilateral, pt reported she has some discomfort in her hips. pt 4/5 MMT hip flexion sitting EOB. Pt requiring supervision secondary to monitoring vitals due to orthostatic hypotension.  Stairs            Wheelchair Mobility    Modified Rankin (Stroke Patients Only)       Balance  Overall balance assessment: History of Falls;Needs assistance   Sitting balance-Leahy Scale: Normal     Standing balance support: Bilateral upper extremity supported Standing balance-Leahy Scale: Fair                               Pertinent Vitals/Pain Pain Assessment: 0-10 Pain Score: 7  Pain Location: face and nose Pain Descriptors / Indicators: Aching Pain Intervention(s): Limited activity within patient's tolerance;Monitored during session;Premedicated before session    Home Living Family/patient expects to be discharged to:: Private residence Living Arrangements: Alone Available Help at Discharge: Family Type of Home: House Home Access: Level entry     Home Layout: One level Home Equipment: Environmental consultant - 2 wheels      Prior Function Level of Independence: Independent with assistive device(s)         Comments: uses rw to ambulate outside the home, has had several falls recently where she passes out.     Hand Dominance        Extremity/Trunk Assessment   Upper Extremity Assessment: Defer to OT evaluation           Lower Extremity Assessment: Overall WFL for tasks assessed         Communication   Communication: No difficulties  Cognition Arousal/Alertness: Awake/alert Behavior During Therapy: WFL for tasks assessed/performed Overall Cognitive Status: Within Functional Limits for tasks assessed  General Comments      Exercises        Assessment/Plan    PT Assessment Patient needs continued PT services  PT Diagnosis Difficulty walking   PT Problem List Decreased activity tolerance;Decreased mobility;Decreased balance;Pain;Decreased strength  PT Treatment Interventions DME instruction;Gait training;Therapeutic activities;Therapeutic exercise;Balance training;Patient/family education   PT Goals (Current goals can be found in the Care Plan section) Acute Rehab PT Goals Patient Stated Goal: to return  home PT Goal Formulation: With patient Time For Goal Achievement: 02/25/15 Potential to Achieve Goals: Good    Frequency Min 3X/week   Barriers to discharge        Co-evaluation               End of Session Equipment Utilized During Treatment: Gait belt Activity Tolerance: Patient tolerated treatment well Patient left: in bed;with call bell/phone within reach;with nursing/sitter in room Nurse Communication: Mobility status         Time: 5183-4373 PT Time Calculation (min) (ACUTE ONLY): 40 min   Charges:   PT Evaluation $Initial PT Evaluation Tier I: 1 Procedure PT Treatments $Gait Training: 8-22 mins $Therapeutic Activity: 8-22 mins   PT G Codes:        Lelon Mast 02/11/2015, 8:42 AM

## 2015-02-11 NOTE — Progress Notes (Signed)
Patient received to 5W06 from ED via stretcher, ambulated with assist from stretcher to bed, SR up, call bell in reach, bed alarm set. Patient oriented to room, call bell, bed controls, patient guide book, fall protocol and all orders with verbal understanding. ID band placed after verifying information with patient. Denies pain or problems. Skin intact, bruising noted around eyes. Admissiion/assessment paperwork complete. No problems noted or voiced at this time. Will monitor.

## 2015-02-11 NOTE — H&P (Signed)
[facility] History and Physical  Kristen [name] LPF:[id number] DOB: Feb 27, 1946 DOA: 02/10/2015  Referring physician: Dr Kathrynn Humble PCP: Vic Blackbird, MD   Chief Complaint: Syncope  HPI: Kristen [name] is a 69 y.o. female with hx of back pain/ back surgery, atypical CP, pyelonephritis who presented after having a syncopal epidose while on the toilet.  Fell and hit her nose. A/O x 4 on arrival but having 9/10 headache. Nose was bleeding on arrival but has since stopped.  CT showed fractured nasal bones bilat but no other fractures.     Last week she fell backward at home, just "got dizzy-headed" and went down.  DIdn't "blackout" last week. Has had chills a couple times this week, didn't take her temp. No sweats at night.  Has hot flashes.  +burning w urination, no odor.  Hx UTI one time in the past, a "longtime". No n/v/d/, no abd pain or back pain.  Hx bilat TKR. No recent rash, joint pain, back pain.  No hx MI (has had multiple heart cath's for CP).  No hx CVA.    Chart review: '01 - chest pain w negative Cardiolite, ruled out for MI '01 - noncardiac CP, had heart caht with 30% disase or less in mult vessels, no PCI '02 - abd pain / diarrhea, hx chole '05 - chest pain, repeat heart cath negative '06 - pyelonephritis, DM2 high A1C, chest pain, ruled out '07 - chest pain, MS not cardiac. Normal cath in 2006.  '08 - noncardiac chest pain, migraines, DJD, kidney stones, chronic chest pain '08 - chest pain > had heart cath which was again negative, noncardiac CP '10 - chest pain > underwent heart cath which was negative, EF normal '11 - aytpical chest pain, Hx HTN/ HL/ DM '11 - laminectomy L1 - S1 for spinal stenosis, HTN/ DM, L THR '09, bilat TKR, OSA, depression '14 - left breast dcis > underwent lefet mastectomy with snbx w/o reconstruction. Did well. [id number] tumor.    Where does patient live? home alone Can patient participate in ADLs? yes  Past Medical History  Past Medical  History  Diagnosis Date  . Diabetes mellitus   . Coronary artery disease, non-occlusive     cath 2010 by Dr SLia Foyerrevealed scattered nonobstrructive disease with preserved EF  . Hyperlipidemia   . Allergy   . Anxiety   . Depression   . Arthritis   . Hypertension   . Hemorrhoids   . Obesity   . Overactive bladder   . Vitamin D deficiency   . GERD (gastroesophageal reflux disease)   . Cancer     breast   Past Surgical History  Past Surgical History  Procedure Laterality Date  . Oophorectomy Right     1.5  ovaries rem  . Replacement total knee      both knees  . Back surgery    . Partial hip arthroplasty      left hip  . Cholecystectomy, laparoscopic    . Lithotripsy    . Cesarean section    . Joint replacement    . Cholecystectomy    . Tonsillectomy    . Appendectomy    . Cardiac catheterization  01/2009    cath by Dr SLia Foyerrevealed nonobstructive CAD  . Total mastectomy Left 12/24/2012    Dr WDonne Hazel . Mastectomy w/ sentinel node biopsy Left 12/24/2012    Procedure: LEFT MASTECTOMY WITH LEFT SENTINEL LYMPH NODE BIOPSY;  Surgeon: MRolm Bookbinder MD;  [facility]  Service: General;  Laterality: Left;  . Breast surgery     Family History  Family History  Problem Relation Age of Onset  . Cancer Mother     mouth cancer  . Breast cancer Mother     possibly dx in her 20s  . Lung cancer Brother 91  . Lung cancer Brother   . Lung cancer Brother   . Heart attack Father 77  . Brain cancer Brother     Social History  reports that she has never smoked. She has never used smokeless tobacco. She reports that she does not drink alcohol or use illicit drugs. Allergies  Allergies  Allergen Reactions  . Sulfonamide Derivatives Hives   Home medications Prior to Admission medications   Medication Sig Start Date End Date Taking? Authorizing Provider  aspirin EC 81 MG tablet Take 81 mg by mouth daily.   Yes Historical Provider, MD  atorvastatin (LIPITOR) 40 MG  tablet Take 1 tablet (40 mg total) by mouth daily at 6 PM. 12/29/14  Yes Alycia Rossetti, MD  Docusate Calcium (STOOL SOFTENER PO) Take 2 capsules by mouth every evening.    Yes Historical Provider, MD  exemestane (AROMASIN) 25 MG tablet Take 1 tablet (25 mg total) by mouth daily after breakfast. 10/24/14  Yes Alycia Rossetti, MD  ezetimibe (ZETIA) 10 MG tablet Take 1 tablet (10 mg total) by mouth daily. 12/29/14  Yes Alycia Rossetti, MD  FLUoxetine (PROZAC) 40 MG capsule Take 1 capsule (40 mg total) by mouth daily. 12/29/14  Yes Alycia Rossetti, MD  gabapentin (NEURONTIN) 300 MG capsule Take 1 capsule (300 mg total) by mouth 3 (three) times daily. 12/29/14  Yes Alycia Rossetti, MD  ibuprofen (ADVIL,MOTRIN) 200 MG tablet Take 200 mg by mouth every 6 (six) hours as needed for pain.   Yes Historical Provider, MD  insulin glargine (LANTUS) 100 unit/mL SOPN Inject 0.4 mLs (40 Units total) into the skin at bedtime. 12/01/14  Yes Alycia Rossetti, MD  lisinopril (PRINIVIL,ZESTRIL) 10 MG tablet Take 1 tablet (10 mg total) by mouth daily. 12/29/14  Yes Alycia Rossetti, MD  metFORMIN (GLUCOPHAGE) 1000 MG tablet Take 1 tablet (1,000 mg total) by mouth 2 (two) times daily with a meal. 12/29/14  Yes Alycia Rossetti, MD  omeprazole (PRILOSEC) 20 MG capsule Take 1 capsule (20 mg total) by mouth 2 (two) times daily. 12/29/14  Yes Alycia Rossetti, MD  oxybutynin (DITROPAN) 5 MG tablet Take 1 tablet (5 mg total) by mouth 2 (two) times daily. 12/29/14  Yes Alycia Rossetti, MD  blood glucose meter kit and supplies KIT Dispense based on patient and insurance preference. Use to monitor FSBS 3x daily. Dx: E11.41. 12/29/14   Alycia Rossetti, MD  glucose blood test strip Dispense based on patient and insurance preference. Use to monitor FSBS 3x daily. Dx: Z00.17. 12/29/14   Alycia Rossetti, MD  Lancets Misc. KIT Dispense based on patient and insurance preference. Use to monitor FSBS 3x daily. Dx: E11.41. 12/29/14   Alycia Rossetti, MD  Lancets MISC Dispense based on patient and insurance preference. Use to monitor FSBS 3x daily. Dx: E11.41. 12/29/14   Alycia Rossetti, MD  sodium polystyrene (KAYEXALATE) powder Take 30g by mouth x1 dose for elevated potassium. Patient not taking: Reported on 02/10/2015 12/31/14   Alycia Rossetti, MD   Liver Function Tests No results for input(s): AST, ALT, ALKPHOS, BILITOT, PROT, ALBUMIN in the last 168  hours. No results for input(s): LIPASE, AMYLASE in the last 168 hours. CBC  Recent Labs Lab 02/10/15 1632  WBC 6.7  NEUTROABS 4.2  HGB 11.3*  HCT 34.9*  MCV 90.4  PLT 676   Basic Metabolic Panel  Recent Labs Lab 02/10/15 1632  NA 136  K 5.5*  CL 106  CO2 23  GLUCOSE 244*  BUN 27*  CREATININE 1.06*  CALCIUM 9.4     Filed Vitals:   02/10/15 2000 02/10/15 2015 02/10/15 2030 02/10/15 2150  BP: 123/78 119/86 121/64 141/66  Pulse: 63 63 64 60  Temp:      TempSrc:      Resp:    19  SpO2: 96% 100% 96% 96%   Exam: Bilat periorbital ecchymoses, alert, calm, no bleeding now No rash, cyanosis or gangrene Sclera anicteric, throat clear No JVD Chest clear bilat , no MRG Abd soft ntnd no mass or ascites GU deferred MS scars on knees , no LE edema, no UE edema No wounds or ulcers Neuro is alert, Ox 3, no focal deficits   Home meds > asa, lipitor, colace, exemastane, prozac, neurontin, advil, lantus 40 hs, lisinopril, metformin, prilosec, oxybutynin  Na 136  K 5.5  CO2 23  BUN 27   Creat 1.06, Ca 9.4  Glu 244   WBC 6k  Hb 11  plt 235  Glu 244 UA few bact, large LE, 21-50 wbc CT head > 1. Acute mildly displaced bilateral nasal bone and bony nasal septal fractures. 2. Associated soft tissue injury with preseptal orbital swelling. No evidence of globe injury 3. No acute CVA, normal brain o/w  Assessment: 1. Syncopal episode- looks vol depleted and has UTI.  Vasovagal vs micturition contributing as well. Get echo, watch on telemetry for arrhythmia. Give  IVF's and hold acei / nsaids given [place]. Treat UTI. EKG and CXR.  2. Hyperkalemia - renal diet for now 3. DM uncontrolled - cont lantus, Metformin, add SSI 4. Nasal bone fracture, bilat 5. HTN - hold lisinopril for now until K down 6. Breast ca - s/p L mastectomy, on aromatase inhibitor 7. UTI - give Rocephin  Plan - as above    DVT Prophylaxis scd's  Code Status: full  Family Communication: nopne  Disposition Plan: home when better    Ladawna Walgren D [facility] Pager [phone]  If 7PM-7AM, please contact night-coverage [email or web address] Password [facility] 02/11/2015, 12:43 AM

## 2015-02-11 NOTE — Care Management Note (Signed)
Case Management Note  Patient Details  Name: Kristen [name] MRN: [id number] Date of Birth: [birth year]  Subjective/Objective:                 Spoke with patient, she denies any barriers to obtaining medications, getting to doctor appointments (daughter drives her), or self care. 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   Action/Plan:  Will continue to follow and offer resources as needed.  Expected Discharge Date:                  Expected Discharge Plan:  [place]  In-House Referral:     Discharge planning Services  CM Consult  Post Acute Care Choice:    Choice offered to:     DME Arranged:    DME Agency:     HH Arranged:    [place] Agency:     Status of Service:  In process, will continue to follow  Medicare Important Message Given:    Date Medicare IM Given:    Medicare IM give by:    Date Additional Medicare IM Given:    Additional Medicare Important Message give by:     If discussed at [place] of Stay Meetings, dates discussed:    Additional Comments:  Carles Collet, RN 02/11/2015, 3:31 PM

## 2015-02-11 NOTE — Progress Notes (Signed)
  Echocardiogram 2D Echocardiogram has been performed.  Kristen [name] 02/11/2015, 11:47 AM

## 2015-02-12 DIAGNOSIS — I10 Essential (primary) hypertension: Secondary | ICD-10-CM

## 2015-02-12 DIAGNOSIS — N390 Urinary tract infection, site not specified: Secondary | ICD-10-CM

## 2015-02-12 DIAGNOSIS — R55 Syncope and collapse: Secondary | ICD-10-CM | POA: Diagnosis not present

## 2015-02-12 DIAGNOSIS — E875 Hyperkalemia: Secondary | ICD-10-CM

## 2015-02-12 LAB — BASIC METABOLIC PANEL
Anion gap: 8 (ref 5–15)
BUN: 10 mg/dL (ref 6–20)
CALCIUM: 8.7 mg/dL — AB (ref 8.9–10.3)
CO2: 27 mmol/L (ref 22–32)
CREATININE: 0.94 mg/dL (ref 0.44–1.00)
Chloride: 103 mmol/L (ref 101–111)
GFR calc Af Amer: >60 mL/min (ref >60)
GLUCOSE: 217 mg/dL — AB (ref 65–99)
Potassium: 4.2 mmol/L (ref 3.5–5.1)
Sodium: 138 mmol/L (ref 135–145)

## 2015-02-12 LAB — URINE CULTURE

## 2015-02-12 LAB — GLUCOSE, CAPILLARY: Glucose-Capillary: 228 mg/dL — ABNORMAL HIGH (ref 65–99)

## 2015-02-12 MED ORDER — CEPHALEXIN 500 MG PO CAPS: 500.0000 mg | ORAL_CAPSULE | Freq: Three times a day (TID) | ORAL | Status: DC

## 2015-02-12 MED ORDER — AMLODIPINE BESYLATE 2.5 MG PO TABS: 2.5000 mg | ORAL_TABLET | Freq: Every day | ORAL | Status: DC

## 2015-02-12 NOTE — Discharge Summary (Addendum)
PATIENT DETAILS Name: Kristen [name] Age: 69 y.o. Sex: female Date of Birth: Feb 24, 1946 MRN: [id number]. Admitting Physician: Roney Jaffe, MD MBE:[id number], KAWANTA, MD  Admit Date: 02/10/2015 Discharge date: 02/12/2015  Recommendations for Outpatient Follow-up:  1. Please repeat CBC/BMET at next visit 2. Please follow urine cultures till final 3. Refer to ENT for Acute mildly displaced bilateral nasal bone and bony nasal septal fractures.  PRIMARY DISCHARGE DIAGNOSIS:  Active Problems:   Essential hypertension   Syncope and collapse   UTI (lower urinary tract infection)   Hyperkalemia   Breast cancer   Nasal bone fractures   DM (diabetes mellitus), secondary uncontrolled   Syncope      PAST MEDICAL HISTORY: Past Medical History  Diagnosis Date  . Diabetes mellitus   . Coronary artery disease, non-occlusive     cath 2010 by Dr Lia Foyer revealed scattered nonobstrructive disease with preserved EF  . Hyperlipidemia   . Allergy   . Anxiety   . Depression   . Arthritis   . Hypertension   . Hemorrhoids   . Obesity   . Overactive bladder   . Vitamin D deficiency   . GERD (gastroesophageal reflux disease)   . Cancer     breast    DISCHARGE MEDICATIONS: Current Discharge Medication List    START taking these medications   Details  amLODipine (NORVASC) 2.5 MG tablet Take 1 tablet (2.5 mg total) by mouth daily. Qty: 30 tablet, Refills: 0    cephALEXin (KEFLEX) 500 MG capsule Take 1 capsule (500 mg total) by mouth 3 (three) times daily. Qty: 6 capsule, Refills: 0      CONTINUE these medications which have NOT CHANGED   Details  aspirin EC 81 MG tablet Take 81 mg by mouth daily.    atorvastatin (LIPITOR) 40 MG tablet Take 1 tablet (40 mg total) by mouth daily at 6 PM. Qty: 90 tablet, Refills: 2    Docusate Calcium (STOOL SOFTENER PO) Take 2 capsules by mouth every evening.     exemestane (AROMASIN) 25 MG tablet Take 1 tablet (25 mg total) by mouth daily after  breakfast. Qty: 90 tablet, Refills: 2    ezetimibe (ZETIA) 10 MG tablet Take 1 tablet (10 mg total) by mouth daily. Qty: 90 tablet, Refills: 2    FLUoxetine (PROZAC) 40 MG capsule Take 1 capsule (40 mg total) by mouth daily. Qty: 90 capsule, Refills: 2    gabapentin (NEURONTIN) 300 MG capsule Take 1 capsule (300 mg total) by mouth 3 (three) times daily. Qty: 270 capsule, Refills: 3   Associated Diagnoses: Diabetic mononeuropathy associated with diabetes mellitus due to underlying condition    ibuprofen (ADVIL,MOTRIN) 200 MG tablet Take 200 mg by mouth every 6 (six) hours as needed for pain.    insulin glargine (LANTUS) 100 unit/mL SOPN Inject 0.4 mLs (40 Units total) into the skin at bedtime. Qty: 45 mL, Refills: 2   Associated Diagnoses: Diabetes mellitus without complication    metFORMIN (GLUCOPHAGE) 1000 MG tablet Take 1 tablet (1,000 mg total) by mouth 2 (two) times daily with a meal. Qty: 180 tablet, Refills: 2    omeprazole (PRILOSEC) 20 MG capsule Take 1 capsule (20 mg total) by mouth 2 (two) times daily. Qty: 180 capsule, Refills: 2    oxybutynin (DITROPAN) 5 MG tablet Take 1 tablet (5 mg total) by mouth 2 (two) times daily. Qty: 180 tablet, Refills: 2    blood glucose meter kit and supplies KIT Dispense based on patient and insurance  preference. Use to monitor FSBS 3x daily. Dx: E11.41. Qty: 1 each, Refills: 0    glucose blood test strip Dispense based on patient and insurance preference. Use to monitor FSBS 3x daily. Dx: E11.41. Qty: 300 each, Refills: 3    Lancets Misc. KIT Dispense based on patient and insurance preference. Use to monitor FSBS 3x daily. Dx: E11.41. Qty: 1 each, Refills: 0    Lancets MISC Dispense based on patient and insurance preference. Use to monitor FSBS 3x daily. Dx: E11.41. Qty: 300 each, Refills: 3      STOP taking these medications     lisinopril (PRINIVIL,ZESTRIL) 10 MG tablet      sodium polystyrene (KAYEXALATE) powder          ALLERGIES:   Allergies  Allergen Reactions  . Sulfonamide Derivatives Hives    BRIEF HPI:  See H&P, Labs, Consult and Test reports for all details in brief, patient was admitted for evaluation of a syncopal episode  CONSULTATIONS:   None  PERTINENT RADIOLOGIC STUDIES: Dg Pelvis 1-2 Views  02/11/2015   CLINICAL DATA:  Right-sided hip pain after fall  EXAM: PELVIS - 1-2 VIEW  COMPARISON:  None.  FINDINGS: There is no evidence of pelvic fracture or diastasis. No pelvic bone lesions are seen. There are intact appearances of the partially imaged left total hip arthroplasty.  IMPRESSION: Negative for acute fracture.   Electronically Signed   By: Andreas Newport M.D.   On: 02/11/2015 02:48   Ct Head Wo Contrast  02/10/2015   CLINICAL DATA:  Fall in bathroom with head injury, loss of consciousness, frontal headaches and periorbital pain and swelling. Initial encounter.  EXAM: CT HEAD WITHOUT CONTRAST  CT MAXILLOFACIAL WITHOUT CONTRAST  TECHNIQUE: Multidetector CT imaging of the head and maxillofacial structures were performed using the standard protocol without intravenous contrast. Multiplanar CT image reconstructions of the maxillofacial structures were also generated.  COMPARISON:  CT 11/29/2010.  FINDINGS: CT HEAD FINDINGS  There is no evidence of acute intracranial hemorrhage, mass lesion, brain edema or extra-axial fluid collection. The ventricles and subarachnoid spaces are appropriately sized for age. There is no CT evidence of acute cortical infarction.  The mastoid air cells and middle ears are clear. Facial findings described below. The calvarium is intact.  CT MAXILLOFACIAL FINDINGS  There are acute mildly displaced bilateral nasal bone fractures. There is also a fracture of the bony nasal septum. The orbital walls and zygomatic arches are intact. The pterygoid plates are intact. No other facial fractures identified.  There is layering blood in the ethmoid, maxillary and sphenoid  sinuses bilaterally. There is soft tissue swelling around the nose extending into the anterior orbits. There is no orbital hematoma or evidence of globe injury. The optic nerves and extra-ocular muscles are intact. The mandible and TMJs are intact.  IMPRESSION: 1. Acute mildly displaced bilateral nasal bone and bony nasal septal fractures. 2. Associated soft tissue injury with preseptal orbital swelling. No evidence of globe injury. 3. No acute intracranial findings.  The calvarium is intact.   Electronically Signed   By: Richardean Sale M.D.   On: 02/10/2015 18:09   Dg Chest Port 1 View  02/11/2015   CLINICAL DATA:  [place].  Left upper chest, chronic.  EXAM: PORTABLE CHEST - 1 VIEW  COMPARISON:  12/17/2012  FINDINGS: There is unchanged right hemidiaphragm elevation. The lungs are clear. Hilar, mediastinal and cardiac contours are unremarkable. No displaced fractures are evident.  IMPRESSION: No acute findings.   Electronically  Signed   By: Andreas Newport M.D.   On: 02/11/2015 03:01   Ct Maxillofacial Wo Cm  02/10/2015   CLINICAL DATA:  Fall in bathroom with head injury, loss of consciousness, frontal headaches and periorbital pain and swelling. Initial encounter.  EXAM: CT HEAD WITHOUT CONTRAST  CT MAXILLOFACIAL WITHOUT CONTRAST  TECHNIQUE: Multidetector CT imaging of the head and maxillofacial structures were performed using the standard protocol without intravenous contrast. Multiplanar CT image reconstructions of the maxillofacial structures were also generated.  COMPARISON:  CT 11/29/2010.  FINDINGS: CT HEAD FINDINGS  There is no evidence of acute intracranial hemorrhage, mass lesion, brain edema or extra-axial fluid collection. The ventricles and subarachnoid spaces are appropriately sized for age. There is no CT evidence of acute cortical infarction.  The mastoid air cells and middle ears are clear. Facial findings described below. The calvarium is intact.  CT MAXILLOFACIAL FINDINGS  There are acute  mildly displaced bilateral nasal bone fractures. There is also a fracture of the bony nasal septum. The orbital walls and zygomatic arches are intact. The pterygoid plates are intact. No other facial fractures identified.  There is layering blood in the ethmoid, maxillary and sphenoid sinuses bilaterally. There is soft tissue swelling around the nose extending into the anterior orbits. There is no orbital hematoma or evidence of globe injury. The optic nerves and extra-ocular muscles are intact. The mandible and TMJs are intact.  IMPRESSION: 1. Acute mildly displaced bilateral nasal bone and bony nasal septal fractures. 2. Associated soft tissue injury with preseptal orbital swelling. No evidence of globe injury. 3. No acute intracranial findings.  The calvarium is intact.   Electronically Signed   By: Richardean Sale M.D.   On: 02/10/2015 18:09     PERTINENT LAB RESULTS: CBC:  Recent Labs  02/10/15 1632 02/11/15 0551  WBC 6.7 8.2  HGB 11.3* 10.9*  HCT 34.9* 35.1*  PLT 235 230   CMET CMP     Component Value Date/Time   NA 138 02/12/2015 0630   NA 140 08/22/2014 1121   K 4.2 02/12/2015 0630   K 5.7* 08/22/2014 1121   CL 103 02/12/2015 0630   CO2 27 02/12/2015 0630   CO2 25 08/22/2014 1121   GLUCOSE 217* 02/12/2015 0630   GLUCOSE 204* 08/22/2014 1121   BUN 10 02/12/2015 0630   BUN 26.9* 08/22/2014 1121   CREATININE 0.94 02/12/2015 0630   CREATININE 0.98 01/05/2015 1113   CREATININE 1.1 08/22/2014 1121   CALCIUM 8.7* 02/12/2015 0630   CALCIUM 9.5 08/22/2014 1121   PROT 6.7 12/29/2014 0844   PROT 7.5 08/22/2014 1121   ALBUMIN 3.9 12/29/2014 0844   ALBUMIN 3.9 08/22/2014 1121   AST 12 12/29/2014 0844   AST 14 08/22/2014 1121   ALT 22 12/29/2014 0844   ALT 27 08/22/2014 1121   ALKPHOS 67 12/29/2014 0844   ALKPHOS 93 08/22/2014 1121   BILITOT 0.5 12/29/2014 0844   BILITOT 0.50 08/22/2014 1121   GFRNONAA >60 02/12/2015 0630   GFRNONAA 87 06/18/2014 0954   GFRAA >60  02/12/2015 0630   GFRAA >89 06/18/2014 0954    GFR Estimated Creatinine Clearance: 63.9 mL/min (by C-G formula based on Cr of 0.94). No results for input(s): LIPASE, AMYLASE in the last 72 hours. No results for input(s): CKTOTAL, CKMB, CKMBINDEX, TROPONINI in the last 72 hours. Invalid input(s): POCBNP No results for input(s): DDIMER in the last 72 hours. No results for input(s): HGBA1C in the last 72 hours. No results for  input(s): CHOL, HDL, LDLCALC, TRIG, CHOLHDL, LDLDIRECT in the last 72 hours.  Recent Labs  02/11/15 0551  TSH 1.834   No results for input(s): VITAMINB12, FOLATE, FERRITIN, TIBC, IRON, RETICCTPCT in the last 72 hours. Coags: No results for input(s): INR in the last 72 hours.  Invalid input(s): PT Microbiology: No results found for this or any previous visit (from the past 240 hour(s)).   BRIEF HOSPITAL COURSE:  Syncope:occured while standing up from the [employer]-preceded by lightheadedness-Highly suspicious for Orthostatic mechanism-but could be micturation syncope as well. Orthostatics vitals positive from lying to sitting.Telemetry neg,  Echo shows preserved EF. Was on Lisinopril prior to this admission-was switched to low-dose amlodipine, and continue treatment of UTI. Ambulated with physical therapy, currently stable for discharge home with home health services  UTI: Managed with Rocephin,  urine culture pending. No fever/leukocytosis-but does endorse dysuria/frequency. We will empirically transition to Keflex, please follow urine cultures still final  Hyperkalemia: Resolved with Kayexalate X 1, Continue to hold Lisinopril on discharge  DM-2:CBG's relatively stable for now-resume metformin and Lantus on discharge. Further optimization deferred to the outpatient setting  Dyslipidemia:continue statin  Essential FHL:[id number] on hold for hyperkalemia-Will start low-dose amlodipine-further optimization deferred to the outpatient setting-may need to allow some  mild permissive hypertension given orthostatic vital signs positive.  Breast ca - s/p L mastectomy:on aromatase inhibitor  Depression:denies suicidal/homicidal ideation-continue Prozac  Nasal fractures: Please refer to ENT as an outpatient  TODAY-DAY OF DISCHARGE:  Subjective:   Sitara Graziosi today has no headache,no chest abdominal pain,no new weakness tingling or numbness, feels much better wants to go home today.   Objective:   Blood pressure 151/86, pulse 59, temperature 97.7 F (36.5 C), temperature source Oral, resp. rate 18, height 5' 4"  (1.626 m), weight 99.6 kg (219 lb 9.3 oz), SpO2 98 %.  Intake/Output Summary (Last 24 hours) at 02/12/15 1021 Last data filed at 02/12/15 0952  Gross per 24 hour  Intake 885.75 ml  Output   4250 ml  Net -3364.25 ml   Filed Weights   02/11/15 1156  Weight: 99.6 kg (219 lb 9.3 oz)    Exam Awake Alert, Oriented *3, No new F.N deficits, Normal affect [place].AT,PERRAL Supple Neck,No JVD, No cervical lymphadenopathy appriciated.  Symmetrical Chest wall movement, Good air movement bilaterally, CTAB RRR,No Gallops,Rubs or new Murmurs, No Parasternal Heave +ve B.Sounds, Abd Soft, Non tender, No organomegaly appriciated, No rebound -guarding or rigidity. No Cyanosis, Clubbing or edema, No new Rash or bruise  DISCHARGE CONDITION: Stable  DISPOSITION: Home with home health services  DISCHARGE INSTRUCTIONS:    Activity:  As tolerated with Full fall precautions use walker/cane & assistance as needed  Get Medicines reviewed and adjusted: Please take all your medications with you for your next visit with your Primary MD  Please request your Primary MD to go over all hospital tests and procedure/radiological results at the follow up, please ask your Primary MD to get all Hospital records sent to his/her office.  If you experience worsening of your admission symptoms, develop shortness of breath, life threatening emergency, suicidal or  homicidal thoughts you must seek medical attention immediately by calling 911 or calling your MD immediately  if symptoms less severe.  You must read complete instructions/literature along with all the possible adverse reactions/side effects for all the Medicines you take and that have been prescribed to you. Take any new Medicines after you have completely understood and accpet all the possible adverse reactions/side effects.   Do  not drive when taking Pain medications.   Do not take more than prescribed Pain, Sleep and Anxiety Medications  Special Instructions: If you have smoked or chewed Tobacco  in the last 2 yrs please stop smoking, stop any regular Alcohol  and or any Recreational drug use.  Wear Seat belts while driving.  Please note  You were cared for by a hospitalist during your hospital stay. Once you are discharged, your primary care physician will handle any further medical issues. Please note that NO REFILLS for any discharge medications will be authorized once you are discharged, as it is imperative that you return to your primary care physician (or establish a relationship with a primary care physician if you do not have one) for your aftercare needs so that they can reassess your need for medications and monitor your lab values.   Diet recommendation: Diabetic Diet Heart Healthy diet  Discharge Instructions    Diet - low sodium heart healthy    Complete by:  As directed      Diet Carb Modified    Complete by:  As directed      Increase activity slowly    Complete by:  As directed            Follow-up Information    Follow up with [facility].   Why:  will call to set up first home health visit 24-48 hours after discharge.   Contact information:   [address] [place] [address] [phone]       Follow up with Vic Blackbird, MD. Schedule an appointment as soon as possible for a visit in 1 week.   Specialty:  Family Medicine    Contact information:   [place] [address] [phone]       Total Time spent on discharge equals 25 minutes.  SignedOren Binet 02/12/2015 10:21 AM

## 2015-02-12 NOTE — Progress Notes (Signed)
Patient received discharge instructions and education. All questions addressed, Vitals stable for pt. IV dc'd. Telemetry dc'd. Pt left with family member.02/12/2015 1:45 PM Kristen [name]

## 2015-02-12 NOTE — Progress Notes (Signed)
Physical Therapy Treatment Patient Details Name: Kristen [name] MRN: [id number] DOB: 1945-12-06 Today's Date: 02/26/2015    History of Present Illness Kristen [name] is a 69 y.o. female with hx of back pain/ back surgery, atypical CP, pyelonephritis who presented after having a syncopal epidose while on the toilet. Fell and hit her nose. A/O x 4 on arrival but having 9/10 headache. Nose was bleeding on arrival but has since stopped. CT showed fractured nasal bones bilat but no other fractures.     PT Comments    Patient continues to make progress with mobility and gait.  Agree with need for HHPT at discharge.  Follow Up Recommendations  Home health PT;Supervision - Intermittent     Equipment Recommendations  None recommended by PT    Recommendations for Other Services       Precautions / Restrictions Precautions Precautions: Fall Restrictions Weight Bearing Restrictions: No    Mobility  Bed Mobility Overal bed mobility: Modified Independent                Transfers Overall transfer level: Modified independent Equipment used: Rolling walker (2 wheeled) Transfers: Sit to/from Stand Sit to Stand: Modified independent (Device/Increase time)            Ambulation/Gait Ambulation/Gait assistance: Supervision Ambulation Distance (Feet): 160 Feet Assistive device: Rolling walker (2 wheeled) Gait Pattern/deviations: Step-through pattern;Decreased stride length Gait velocity: decreased Gait velocity interpretation: Below normal speed for age/gender General Gait Details: Slow, steady gait with RW.  No c/o dizziness with mobility today.  Encouraged patient to stand for several seconds prior to beginning ambulation for safety.   Stairs            Wheelchair Mobility    Modified Rankin (Stroke Patients Only)       Balance Overall balance assessment: History of Falls         Standing balance support: Bilateral upper extremity supported Standing  balance-Leahy Scale: Poor                      Cognition Arousal/Alertness: Awake/alert Behavior During Therapy: WFL for tasks assessed/performed Overall Cognitive Status: Within Functional Limits for tasks assessed                      Exercises      General Comments        Pertinent Vitals/Pain Pain Assessment: 0-10 Pain Score: 4  Pain Location: face Pain Descriptors / Indicators: Sore Pain Intervention(s): Monitored during session    Home Living                      Prior Function            PT Goals (current goals can now be found in the care plan section) Progress towards PT goals: Progressing toward goals    Frequency  Min 3X/week    PT Plan Current plan remains appropriate    Co-evaluation             End of Session Equipment Utilized During Treatment: Gait belt Activity Tolerance: Patient tolerated treatment well Patient left: in bed;with call bell/phone within reach (sitting EOB)     Time: 3785-8850 PT Time Calculation (min) (ACUTE ONLY): 10 min  Charges:  $Gait Training: 8-22 mins                    G Codes:      Despina Pole 26-Feb-2015, 11:39 AM  Carita Pian Sanjuana Kava, [place] Pager [phone]

## 2015-02-12 NOTE — Progress Notes (Signed)
Results for LOVA, URBIETA (MRN [id number]) as of 02/12/2015 09:36  Ref. Range 02/11/2015 07:48 02/11/2015 11:58 02/11/2015 16:38 02/11/2015 20:58 02/12/2015 07:48  Glucose-Capillary Latest Ref Range: 65-99 mg/dL 140 (H) 222 (H) 290 (H) 277 (H) 228 (H)  CBGs continue to be greater than 180 mg/dl. Recommend increasing Novolog correction scale to MODERATE TID & HS if CBGs remain elevated. Harvel Ricks RN BSN CDE

## 2015-02-12 NOTE — Care Management Note (Signed)
Case Management Note  Patient Details  Name: Kristen [name] MRN: [id number] Date of Birth: [birth year]  Subjective/Objective:                 Spoke with patient, she denies any barriers to obtaining medications, getting to doctor appointments (daughter drives her), or self care. 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     Action/Plan:  HH arranged with [facility] for PT.  Expected Discharge Date:                  Expected Discharge Plan:  [place]  In-House Referral:     Discharge planning Services  CM Consult  Post Acute Care Choice:  Home Health Choice offered to:  Patient  DME Arranged:    DME Agency:  [place]:  PT [facility] Agency:     Status of Service:  Completed, signed off  Medicare Important Message Given:    Date Medicare IM Given:    Medicare IM give by:    Date Additional Medicare IM Given:    Additional Medicare Important Message give by:     If discussed at [place] of Stay Meetings, dates discussed:    Additional Comments:  Carles Collet, RN 02/12/2015, 11:35 AM

## 2015-02-13 DIAGNOSIS — K219 Gastro-esophageal reflux disease without esophagitis: Secondary | ICD-10-CM | POA: Diagnosis not present

## 2015-02-13 DIAGNOSIS — N390 Urinary tract infection, site not specified: Secondary | ICD-10-CM | POA: Diagnosis not present

## 2015-02-13 DIAGNOSIS — F419 Anxiety disorder, unspecified: Secondary | ICD-10-CM | POA: Diagnosis not present

## 2015-02-13 DIAGNOSIS — Z794 Long term (current) use of insulin: Secondary | ICD-10-CM | POA: Diagnosis not present

## 2015-02-13 DIAGNOSIS — E1165 Type 2 diabetes mellitus with hyperglycemia: Secondary | ICD-10-CM | POA: Diagnosis not present

## 2015-02-13 DIAGNOSIS — I10 Essential (primary) hypertension: Secondary | ICD-10-CM | POA: Diagnosis not present

## 2015-02-13 DIAGNOSIS — E785 Hyperlipidemia, unspecified: Secondary | ICD-10-CM | POA: Diagnosis not present

## 2015-02-13 DIAGNOSIS — S022XXD Fracture of nasal bones, subsequent encounter for fracture with routine healing: Secondary | ICD-10-CM | POA: Diagnosis not present

## 2015-02-13 DIAGNOSIS — I2510 Atherosclerotic heart disease of native coronary artery without angina pectoris: Secondary | ICD-10-CM | POA: Diagnosis not present

## 2015-02-13 DIAGNOSIS — F329 Major depressive disorder, single episode, unspecified: Secondary | ICD-10-CM | POA: Diagnosis not present

## 2015-02-13 DIAGNOSIS — E669 Obesity, unspecified: Secondary | ICD-10-CM | POA: Diagnosis not present

## 2015-02-17 DIAGNOSIS — E1165 Type 2 diabetes mellitus with hyperglycemia: Secondary | ICD-10-CM | POA: Diagnosis not present

## 2015-02-17 DIAGNOSIS — I10 Essential (primary) hypertension: Secondary | ICD-10-CM | POA: Diagnosis not present

## 2015-02-17 DIAGNOSIS — I2510 Atherosclerotic heart disease of native coronary artery without angina pectoris: Secondary | ICD-10-CM | POA: Diagnosis not present

## 2015-02-17 DIAGNOSIS — N390 Urinary tract infection, site not specified: Secondary | ICD-10-CM | POA: Diagnosis not present

## 2015-02-17 DIAGNOSIS — F329 Major depressive disorder, single episode, unspecified: Secondary | ICD-10-CM | POA: Diagnosis not present

## 2015-02-17 DIAGNOSIS — S022XXD Fracture of nasal bones, subsequent encounter for fracture with routine healing: Secondary | ICD-10-CM | POA: Diagnosis not present

## 2015-02-18 ENCOUNTER — Encounter: Payer: Self-pay | Admitting: Family Medicine

## 2015-02-18 ENCOUNTER — Ambulatory Visit (INDEPENDENT_AMBULATORY_CARE_PROVIDER_SITE_OTHER): Payer: Medicare Other | Admitting: Family Medicine

## 2015-02-18 VITALS — BP 138/80 | HR 68 | Temp 98.2°F | Resp 14 | Ht 64.0 in | Wt 194.0 lb

## 2015-02-18 DIAGNOSIS — E1365 Other specified diabetes mellitus with hyperglycemia: Secondary | ICD-10-CM | POA: Diagnosis not present

## 2015-02-18 DIAGNOSIS — S022XXA Fracture of nasal bones, initial encounter for closed fracture: Secondary | ICD-10-CM

## 2015-02-18 DIAGNOSIS — E875 Hyperkalemia: Secondary | ICD-10-CM | POA: Diagnosis not present

## 2015-02-18 DIAGNOSIS — I10 Essential (primary) hypertension: Secondary | ICD-10-CM

## 2015-02-18 DIAGNOSIS — IMO0002 Reserved for concepts with insufficient information to code with codable children: Secondary | ICD-10-CM

## 2015-02-18 LAB — CBC WITH DIFFERENTIAL/PLATELET
BASOS ABS: 0 10*3/uL (ref 0.0–0.1)
BASOS PCT: 0 % (ref 0–1)
EOS ABS: 0.4 10*3/uL (ref 0.0–0.7)
EOS PCT: 5 % (ref 0–5)
HCT: 35.7 % — ABNORMAL LOW (ref 36.0–46.0)
Hemoglobin: 11.5 g/dL — ABNORMAL LOW (ref 12.0–15.0)
LYMPHS ABS: 2.2 10*3/uL (ref 0.7–4.0)
Lymphocytes Relative: 30 % (ref 12–46)
MCH: 28.6 pg (ref 26.0–34.0)
MCHC: 32.2 g/dL (ref 30.0–36.0)
MCV: 88.8 fL (ref 78.0–100.0)
MPV: 9.6 fL (ref 8.6–12.4)
Monocytes Absolute: 0.7 10*3/uL (ref 0.1–1.0)
Monocytes Relative: 9 % (ref 3–12)
NEUTROS PCT: 56 % (ref 43–77)
Neutro Abs: 4.1 10*3/uL (ref 1.7–7.7)
PLATELETS: 291 10*3/uL (ref 150–400)
RBC: 4.02 MIL/uL (ref 3.87–5.11)
RDW: 15.1 % (ref 11.5–15.5)
WBC: 7.4 10*3/uL (ref 4.0–10.5)

## 2015-02-18 LAB — BASIC METABOLIC PANEL
BUN: 20 mg/dL (ref 7–25)
CO2: 27 mmol/L (ref 20–31)
Calcium: 9.2 mg/dL (ref 8.6–10.4)
Chloride: 99 mmol/L (ref 98–110)
Creat: 0.92 mg/dL (ref 0.60–0.93)
Glucose, Bld: 162 mg/dL — ABNORMAL HIGH (ref 70–99)
Potassium: 5.2 mmol/L (ref 3.5–5.3)
Sodium: 140 mmol/L (ref 135–146)

## 2015-02-18 MED ORDER — AMLODIPINE BESYLATE 2.5 MG PO TABS: 2.5000 mg | ORAL_TABLET | Freq: Every day | ORAL | Status: DC

## 2015-02-18 MED ORDER — HYDROCODONE-ACETAMINOPHEN 5-325 MG PO TABS: 1.0000 | ORAL_TABLET | Freq: Two times a day (BID) | ORAL | Status: DC | PRN

## 2015-02-18 NOTE — Assessment & Plan Note (Signed)
Blood pressure looks good we will hold off on the lisinopril for now C we can get her potassium levels to stabilize we'll consider adding back at 2.5 mg for renal protection from her diabetes

## 2015-02-18 NOTE — Patient Instructions (Addendum)
Referral to Ear nose and throat Pain medication as prescribed STOP the lisinopril for now Continue the norvasc  F/U as previous

## 2015-02-18 NOTE — Progress Notes (Signed)
Patient ID: Kristen [name], female   DOB: August 11, 1945, 69 y.o.   MRN: [id number]   Subjective:    Patient ID: Kristen [name], female    DOB: 03-11-1946, 69 y.o.   MRN: [id number]  Patient presents for Hospital F/U  Pt here for hospital follow-up. She was admitted after she sustained a fall in the bathroom. It is unclear if she had some type of vasovagal episode. However the fall resulted in bilateral nasal bone fractures. Workup showed possible UTI she was put on Keflex however the culture showed multiple morphotypes she was not having symptoms beforehand. Chest x-ray was negative CT scan of head was benign with the exception of the fractures. She was not hypotensive however  She did have some mild hyperkalemia at 5.5 therefore lisinopril was stopped she was given Kayexalate and she was put on amlodipine 2.5 mg. She states that she is doing well at home. Her blood sugars have been 120-140 her last A1c was 8.7%. She denies any hypoglycemia symptoms before the fall. She now has home physical therapy.   Complete hospital record as well as imaging labs were reviewed    Review Of Systems:  GEN- denies fatigue, fever, weight loss,weakness, recent illness HEENT- denies eye drainage, change in vision, nasal discharge, CVS- denies chest pain, palpitations RESP- denies SOB, cough, wheeze ABD- denies N/V, change in stools, abd pain GU- denies dysuria, hematuria, dribbling, incontinence MSK- + joint pain, muscle aches, injury Neuro- denies headache, dizziness, syncope, seizure activity       Objective:    BP 138/80 mmHg  Pulse 68  Temp(Src) 98.2 F (36.8 C) (Oral)  Resp 14  Ht 5\' 4"  (1.626 m)  Wt 194 lb (87.998 kg)  BMI 33.28 kg/m2 GEN- NAD, alert and oriented x3 HEENT- PERRL, EOMI, non injected sclera, pink conjunctiva, MMM, oropharynx clear - swelling and bruising below both eyes, TTP nasal bones and left cheek Neck- Supple, no thyromegaly CVS- RRR, no  murmur RESP-CTAB ABD-NABS,soft,NT,ND EXT- No edema Pulses- Radial, DP- 2+        Assessment & Plan:      Problem List Items Addressed This Visit    Nasal bone fractures   Hyperkalemia - Primary   Relevant Orders   Basic metabolic panel   Essential hypertension   Relevant Medications   amLODipine (NORVASC) 2.5 MG tablet   Other Relevant Orders   CBC with Differential/Platelet   DM (diabetes mellitus), secondary uncontrolled      Note: This dictation was prepared with Dragon dictation along with smaller phrase technology. Any transcriptional errors that result from this process are unintentional.

## 2015-02-18 NOTE — Assessment & Plan Note (Signed)
A1c continues to improve no change in her current dose of insulin as I recently adjusted things.  with her age goal is an A1c less than 8%

## 2015-02-18 NOTE — Assessment & Plan Note (Signed)
Bilateral fractures. She was not given any pain medication. She does tolerate hydrocodone in low doses we'll give this to her twice a day as needed and her daughter is here with her today who also helps supervise medications. I am going to refer her to ear nose and throat to make sure that she is healing adequately

## 2015-02-20 DIAGNOSIS — E1165 Type 2 diabetes mellitus with hyperglycemia: Secondary | ICD-10-CM | POA: Diagnosis not present

## 2015-02-20 DIAGNOSIS — I10 Essential (primary) hypertension: Secondary | ICD-10-CM | POA: Diagnosis not present

## 2015-02-20 DIAGNOSIS — N390 Urinary tract infection, site not specified: Secondary | ICD-10-CM | POA: Diagnosis not present

## 2015-02-20 DIAGNOSIS — F329 Major depressive disorder, single episode, unspecified: Secondary | ICD-10-CM | POA: Diagnosis not present

## 2015-02-20 DIAGNOSIS — I2510 Atherosclerotic heart disease of native coronary artery without angina pectoris: Secondary | ICD-10-CM | POA: Diagnosis not present

## 2015-02-20 DIAGNOSIS — S022XXD Fracture of nasal bones, subsequent encounter for fracture with routine healing: Secondary | ICD-10-CM | POA: Diagnosis not present

## 2015-02-23 DIAGNOSIS — S022XXD Fracture of nasal bones, subsequent encounter for fracture with routine healing: Secondary | ICD-10-CM | POA: Diagnosis not present

## 2015-02-23 DIAGNOSIS — I10 Essential (primary) hypertension: Secondary | ICD-10-CM | POA: Diagnosis not present

## 2015-02-23 DIAGNOSIS — N390 Urinary tract infection, site not specified: Secondary | ICD-10-CM | POA: Diagnosis not present

## 2015-02-23 DIAGNOSIS — F329 Major depressive disorder, single episode, unspecified: Secondary | ICD-10-CM | POA: Diagnosis not present

## 2015-02-23 DIAGNOSIS — I2510 Atherosclerotic heart disease of native coronary artery without angina pectoris: Secondary | ICD-10-CM | POA: Diagnosis not present

## 2015-02-23 DIAGNOSIS — E1165 Type 2 diabetes mellitus with hyperglycemia: Secondary | ICD-10-CM | POA: Diagnosis not present

## 2015-02-26 ENCOUNTER — Other Ambulatory Visit (HOSPITAL_BASED_OUTPATIENT_CLINIC_OR_DEPARTMENT_OTHER): Payer: Medicare Other

## 2015-02-26 ENCOUNTER — Encounter: Payer: Self-pay | Admitting: Hematology and Oncology

## 2015-02-26 ENCOUNTER — Ambulatory Visit (HOSPITAL_BASED_OUTPATIENT_CLINIC_OR_DEPARTMENT_OTHER): Payer: Medicare Other | Admitting: Hematology and Oncology

## 2015-02-26 ENCOUNTER — Telehealth: Payer: Self-pay | Admitting: Hematology and Oncology

## 2015-02-26 VITALS — BP 122/92 | HR 82 | Temp 98.1°F | Resp 18 | Ht 64.0 in | Wt 193.5 lb

## 2015-02-26 DIAGNOSIS — C50412 Malignant neoplasm of upper-outer quadrant of left female breast: Secondary | ICD-10-CM

## 2015-02-26 LAB — CBC WITH DIFFERENTIAL/PLATELET
BASO%: 0.5 % (ref 0.0–2.0)
BASOS ABS: 0 10*3/uL (ref 0.0–0.1)
EOS ABS: 0.4 10*3/uL (ref 0.0–0.5)
EOS%: 6.7 % (ref 0.0–7.0)
HEMATOCRIT: 37.8 % (ref 34.8–46.6)
HGB: 11.8 g/dL (ref 11.6–15.9)
LYMPH#: 1.6 10*3/uL (ref 0.9–3.3)
LYMPH%: 24.6 % (ref 14.0–49.7)
MCH: 28.4 pg (ref 25.1–34.0)
MCHC: 31.2 g/dL — AB (ref 31.5–36.0)
MCV: 91.1 fL (ref 79.5–101.0)
MONO#: 0.6 10*3/uL (ref 0.1–0.9)
MONO%: 8.6 % (ref 0.0–14.0)
NEUT#: 3.9 10*3/uL (ref 1.5–6.5)
NEUT%: 59.6 % (ref 38.4–76.8)
PLATELETS: 268 10*3/uL (ref 145–400)
RBC: 4.15 10*6/uL (ref 3.70–5.45)
RDW: 14.5 % (ref 11.2–14.5)
WBC: 6.6 10*3/uL (ref 3.9–10.3)

## 2015-02-26 LAB — COMPREHENSIVE METABOLIC PANEL (CC13)
ALT: 19 U/L (ref 0–55)
ANION GAP: 11 meq/L (ref 3–11)
AST: 12 U/L (ref 5–34)
Albumin: 3.7 g/dL (ref 3.5–5.0)
Alkaline Phosphatase: 92 U/L (ref 40–150)
BUN: 25.8 mg/dL (ref 7.0–26.0)
CALCIUM: 9.6 mg/dL (ref 8.4–10.4)
CHLORIDE: 101 meq/L (ref 98–109)
CO2: 25 mEq/L (ref 22–29)
CREATININE: 1.4 mg/dL — AB (ref 0.6–1.1)
EGFR: 37 mL/min/{1.73_m2} — AB (ref >90)
Glucose: 410 mg/dl — ABNORMAL HIGH (ref 70–140)
Potassium: 5.3 mEq/L — ABNORMAL HIGH (ref 3.5–5.1)
Sodium: 137 mEq/L (ref 136–145)
Total Bilirubin: 0.47 mg/dL (ref 0.20–1.20)
Total Protein: 7.2 g/dL (ref 6.4–8.3)

## 2015-02-26 NOTE — Telephone Encounter (Signed)
Gave avs & calendar for October  2017

## 2015-02-26 NOTE — Assessment & Plan Note (Signed)
Left breast invasive ductal carcinoma with DCIS T1, N0, M0 stage IA ER/PR positive HER-2 negative status post right mastectomy currently on adjuvant hormonal therapy with Aromasin since 01/11/2013.  Aromasin toxicities: She is tolerating it very well without any major problems or concerns. Denies any hot flashes achiness denies any vaginal bleeding or muscle aches or pains.  Breast cancerSurveillance:  1. Breast exam 02/26/2015 is normal 2. Mammogram and bone density were reviewed with the patient and they were normal done on 03/03/2014.  3. Bone density showed a T score of -0.9. Done on 07/02/2014  Recent hospitalization for syncope: Felt to be related to blood pressure medications and UTI  Return to clinic in 6 months for follow-up breast exams.

## 2015-02-26 NOTE — Progress Notes (Signed)
Patient Care Team: Alycia Rossetti, MD as PCP - General (Family Medicine)  DIAGNOSIS: Malignant neoplasm of upper-outer quadrant of left female breast   Staging form: Breast, AJCC 7th Edition     Pathologic: Stage IA (T1a, N0, cM0) - Signed by Rulon Eisenmenger, MD on 01/31/2014   SUMMARY OF ONCOLOGIC HISTORY:   Malignant neoplasm of upper-outer quadrant of left female breast   11/30/2012 Initial Diagnosis Malignant neoplasm of upper-outer quadrant of left female breast: Left breast bloody nipple discharge that led to mammogram. Initial biopsy revealed DCIS ER/PR positive   12/24/2012 Surgery Left mastectomy with sentinel lymph node biopsy. Invasive ductal carcinoma grade 2 with 2 foci each measuring less than 0.1 cm with high-grade DCIS 2.6 cm and 1.4 cm 6 sentinel nodes negative ER 100% PR 100% HER-2 negative Ki-67 14%   01/31/2013 -  Anti-estrogen oral therapy Aromasin 25 mg once daily    CHIEF COMPLIANT: Follow-up on Aromasin  INTERVAL HISTORY: Kristen [name] is a 69 year old with above-mentioned history of left breast cancer currently on Aromasin. She is tolerating it fairly well. She does have hot flashes. Denies any myalgias. She gets minimal exercise by walking especially when it is not too hot. Denies any lumps or nodules in the breasts. She is going to schedule her mammogram in a couple of weeks.  REVIEW OF SYSTEMS:   Constitutional: Denies fevers, chills or abnormal weight loss Eyes: Denies blurriness of vision Ears, nose, mouth, throat, and face: Denies mucositis or sore throat Respiratory: Denies cough, dyspnea or wheezes Cardiovascular: Denies palpitation, chest discomfort or lower extremity swelling Gastrointestinal:  Denies nausea, heartburn or change in bowel habits Skin: Denies abnormal skin rashes Lymphatics: Denies new lymphadenopathy or easy bruising Neurological:Denies numbness, tingling or new weaknesses Behavioral/Psych: Mood is stable, no new changes  Breast:  denies  any pain or lumps or nodules in either breasts All other systems were reviewed with the patient and are negative.  I have reviewed the past medical history, past surgical history, social history and family history with the patient and they are unchanged from previous note.  ALLERGIES:  is allergic to sulfonamide derivatives.  MEDICATIONS:  Current Outpatient Prescriptions  Medication Sig Dispense Refill  . amLODipine (NORVASC) 2.5 MG tablet Take 1 tablet (2.5 mg total) by mouth daily. 90 tablet 0  . aspirin EC 81 MG tablet Take 81 mg by mouth daily.    Marland Kitchen atorvastatin (LIPITOR) 40 MG tablet Take 1 tablet (40 mg total) by mouth daily at 6 PM. 90 tablet 2  . blood glucose meter kit and supplies KIT Dispense based on patient and insurance preference. Use to monitor FSBS 3x daily. Dx: E11.41. 1 each 0  . Docusate Calcium (STOOL SOFTENER PO) Take 2 capsules by mouth every evening.     Marland Kitchen exemestane (AROMASIN) 25 MG tablet Take 1 tablet (25 mg total) by mouth daily after breakfast. 90 tablet 2  . ezetimibe (ZETIA) 10 MG tablet Take 1 tablet (10 mg total) by mouth daily. 90 tablet 2  . FLUoxetine (PROZAC) 40 MG capsule Take 1 capsule (40 mg total) by mouth daily. 90 capsule 2  . gabapentin (NEURONTIN) 300 MG capsule Take 1 capsule (300 mg total) by mouth 3 (three) times daily. 270 capsule 3  . glucose blood test strip Dispense based on patient and insurance preference. Use to monitor FSBS 3x daily. Dx: E11.41. 300 each 3  . HYDROcodone-acetaminophen (NORCO) 5-325 MG per tablet Take 1 tablet by mouth 2 (two) times daily  as needed for moderate pain. 30 tablet 0  . ibuprofen (ADVIL,MOTRIN) 200 MG tablet Take 200 mg by mouth every 6 (six) hours as needed for pain.    Marland Kitchen insulin glargine (LANTUS) 100 unit/mL SOPN Inject 0.4 mLs (40 Units total) into the skin at bedtime. 45 mL 2  . Lancets Misc. KIT Dispense based on patient and insurance preference. Use to monitor FSBS 3x daily. Dx: E11.41. 1 each 0  .  Lancets MISC Dispense based on patient and insurance preference. Use to monitor FSBS 3x daily. Dx: E11.41. 300 each 3  . metFORMIN (GLUCOPHAGE) 1000 MG tablet Take 1 tablet (1,000 mg total) by mouth 2 (two) times daily with a meal. 180 tablet 2  . omeprazole (PRILOSEC) 20 MG capsule Take 1 capsule (20 mg total) by mouth 2 (two) times daily. 180 capsule 2  . oxybutynin (DITROPAN) 5 MG tablet Take 1 tablet (5 mg total) by mouth 2 (two) times daily. 180 tablet 2   No current facility-administered medications for this visit.    PHYSICAL EXAMINATION: ECOG PERFORMANCE STATUS: 1 - Symptomatic but completely ambulatory  Filed Vitals:   02/26/15 1115  BP: 122/92  Pulse: 82  Temp: 98.1 F (36.7 C)  Resp: 18   Filed Weights   02/26/15 1115  Weight: 193 lb 8 oz (87.771 kg)    GENERAL:alert, no distress and comfortable SKIN: skin color, texture, turgor are normal, no rashes or significant lesions EYES: normal, Conjunctiva are pink and non-injected, sclera clear OROPHARYNX:no exudate, no erythema and lips, buccal mucosa, and tongue normal  NECK: supple, thyroid normal size, non-tender, without nodularity LYMPH:  no palpable lymphadenopathy in the cervical, axillary or inguinal LUNGS: clear to auscultation and percussion with normal breathing effort HEART: regular rate & rhythm and no murmurs and no lower extremity edema ABDOMEN:abdomen soft, non-tender and normal bowel sounds Musculoskeletal:no cyanosis of digits and no clubbing  NEURO: alert & oriented x 3 with fluent speech, no focal motor/sensory deficits BREAST: No palpable lumps or nodules in the right breast and left chest wall are bilateral axilla.. (exam performed in the presence of a chaperone)  LABORATORY DATA:  I have reviewed the data as listed   Chemistry      Component Value Date/Time   NA 140 02/18/2015 1157   NA 140 08/22/2014 1121   K 5.2 02/18/2015 1157   K 5.7* 08/22/2014 1121   CL 99 02/18/2015 1157   CO2 27  02/18/2015 1157   CO2 25 08/22/2014 1121   BUN 20 02/18/2015 1157   BUN 26.9* 08/22/2014 1121   CREATININE 0.92 02/18/2015 1157   CREATININE 0.94 02/12/2015 0630   CREATININE 1.1 08/22/2014 1121      Component Value Date/Time   CALCIUM 9.2 02/18/2015 1157   CALCIUM 9.5 08/22/2014 1121   ALKPHOS 67 12/29/2014 0844   ALKPHOS 93 08/22/2014 1121   AST 12 12/29/2014 0844   AST 14 08/22/2014 1121   ALT 22 12/29/2014 0844   ALT 27 08/22/2014 1121   BILITOT 0.5 12/29/2014 0844   BILITOT 0.50 08/22/2014 1121       Lab Results  Component Value Date   WBC 6.6 02/26/2015   HGB 11.8 02/26/2015   HCT 37.8 02/26/2015   MCV 91.1 02/26/2015   PLT 268 02/26/2015   NEUTROABS 3.9 02/26/2015    ASSESSMENT & PLAN:  Malignant neoplasm of upper-outer quadrant of left female breast Left breast invasive ductal carcinoma with DCIS T1, N0, M0 stage IA ER/PR positive HER-2  negative status post right mastectomy currently on adjuvant hormonal therapy with Aromasin since 01/11/2013.  Aromasin toxicities: She is tolerating it very well without any major problems or concerns. Denies any hot flashes achiness denies any vaginal bleeding or muscle aches or pains.  Breast cancerSurveillance:  1. Breast exam 02/26/2015 is normal 2. Mammogram and bone density were reviewed with the patient and they were normal done on 03/03/2014.  3. Bone density showed a T score of -0.9. Done on 07/02/2014  Recent hospitalization for syncope: Felt to be related to blood pressure medications and UTI  Return to clinic in 1 year for follow-up breast exams.   No orders of the defined types were placed in this encounter.   The patient has a good understanding of the overall plan. she agrees with it. she will call with any problems that may develop before the next visit here.   Rulon Eisenmenger, MD

## 2015-02-27 DIAGNOSIS — I10 Essential (primary) hypertension: Secondary | ICD-10-CM | POA: Diagnosis not present

## 2015-02-27 DIAGNOSIS — I2510 Atherosclerotic heart disease of native coronary artery without angina pectoris: Secondary | ICD-10-CM | POA: Diagnosis not present

## 2015-02-27 DIAGNOSIS — S022XXD Fracture of nasal bones, subsequent encounter for fracture with routine healing: Secondary | ICD-10-CM | POA: Diagnosis not present

## 2015-02-27 DIAGNOSIS — E1165 Type 2 diabetes mellitus with hyperglycemia: Secondary | ICD-10-CM | POA: Diagnosis not present

## 2015-02-27 DIAGNOSIS — F329 Major depressive disorder, single episode, unspecified: Secondary | ICD-10-CM | POA: Diagnosis not present

## 2015-02-27 DIAGNOSIS — N390 Urinary tract infection, site not specified: Secondary | ICD-10-CM | POA: Diagnosis not present

## 2015-03-02 DIAGNOSIS — W19XXXD Unspecified fall, subsequent encounter: Secondary | ICD-10-CM | POA: Diagnosis not present

## 2015-03-02 DIAGNOSIS — J3489 Other specified disorders of nose and nasal sinuses: Secondary | ICD-10-CM | POA: Diagnosis not present

## 2015-03-02 DIAGNOSIS — S022XXG Fracture of nasal bones, subsequent encounter for fracture with delayed healing: Secondary | ICD-10-CM | POA: Diagnosis not present

## 2015-03-02 DIAGNOSIS — J342 Deviated nasal septum: Secondary | ICD-10-CM | POA: Diagnosis not present

## 2015-03-03 DIAGNOSIS — E1165 Type 2 diabetes mellitus with hyperglycemia: Secondary | ICD-10-CM | POA: Diagnosis not present

## 2015-03-03 DIAGNOSIS — I2510 Atherosclerotic heart disease of native coronary artery without angina pectoris: Secondary | ICD-10-CM | POA: Diagnosis not present

## 2015-03-03 DIAGNOSIS — I10 Essential (primary) hypertension: Secondary | ICD-10-CM | POA: Diagnosis not present

## 2015-03-03 DIAGNOSIS — N390 Urinary tract infection, site not specified: Secondary | ICD-10-CM | POA: Diagnosis not present

## 2015-03-03 DIAGNOSIS — F329 Major depressive disorder, single episode, unspecified: Secondary | ICD-10-CM | POA: Diagnosis not present

## 2015-03-03 DIAGNOSIS — S022XXD Fracture of nasal bones, subsequent encounter for fracture with routine healing: Secondary | ICD-10-CM | POA: Diagnosis not present

## 2015-03-06 DIAGNOSIS — N390 Urinary tract infection, site not specified: Secondary | ICD-10-CM | POA: Diagnosis not present

## 2015-03-06 DIAGNOSIS — I2510 Atherosclerotic heart disease of native coronary artery without angina pectoris: Secondary | ICD-10-CM | POA: Diagnosis not present

## 2015-03-06 DIAGNOSIS — E1165 Type 2 diabetes mellitus with hyperglycemia: Secondary | ICD-10-CM | POA: Diagnosis not present

## 2015-03-06 DIAGNOSIS — I10 Essential (primary) hypertension: Secondary | ICD-10-CM | POA: Diagnosis not present

## 2015-03-06 DIAGNOSIS — F329 Major depressive disorder, single episode, unspecified: Secondary | ICD-10-CM | POA: Diagnosis not present

## 2015-03-06 DIAGNOSIS — S022XXD Fracture of nasal bones, subsequent encounter for fracture with routine healing: Secondary | ICD-10-CM | POA: Diagnosis not present

## 2015-03-09 DIAGNOSIS — E1165 Type 2 diabetes mellitus with hyperglycemia: Secondary | ICD-10-CM | POA: Diagnosis not present

## 2015-03-09 DIAGNOSIS — S022XXD Fracture of nasal bones, subsequent encounter for fracture with routine healing: Secondary | ICD-10-CM | POA: Diagnosis not present

## 2015-03-09 DIAGNOSIS — N390 Urinary tract infection, site not specified: Secondary | ICD-10-CM | POA: Diagnosis not present

## 2015-03-09 DIAGNOSIS — I2510 Atherosclerotic heart disease of native coronary artery without angina pectoris: Secondary | ICD-10-CM | POA: Diagnosis not present

## 2015-03-10 DIAGNOSIS — E1165 Type 2 diabetes mellitus with hyperglycemia: Secondary | ICD-10-CM | POA: Diagnosis not present

## 2015-03-10 DIAGNOSIS — S022XXD Fracture of nasal bones, subsequent encounter for fracture with routine healing: Secondary | ICD-10-CM | POA: Diagnosis not present

## 2015-03-10 DIAGNOSIS — I10 Essential (primary) hypertension: Secondary | ICD-10-CM | POA: Diagnosis not present

## 2015-03-10 DIAGNOSIS — F329 Major depressive disorder, single episode, unspecified: Secondary | ICD-10-CM | POA: Diagnosis not present

## 2015-03-10 DIAGNOSIS — N390 Urinary tract infection, site not specified: Secondary | ICD-10-CM | POA: Diagnosis not present

## 2015-03-10 DIAGNOSIS — I2510 Atherosclerotic heart disease of native coronary artery without angina pectoris: Secondary | ICD-10-CM | POA: Diagnosis not present

## 2015-03-12 DIAGNOSIS — N390 Urinary tract infection, site not specified: Secondary | ICD-10-CM | POA: Diagnosis not present

## 2015-03-12 DIAGNOSIS — S022XXD Fracture of nasal bones, subsequent encounter for fracture with routine healing: Secondary | ICD-10-CM | POA: Diagnosis not present

## 2015-03-12 DIAGNOSIS — I2510 Atherosclerotic heart disease of native coronary artery without angina pectoris: Secondary | ICD-10-CM | POA: Diagnosis not present

## 2015-03-12 DIAGNOSIS — I10 Essential (primary) hypertension: Secondary | ICD-10-CM | POA: Diagnosis not present

## 2015-03-12 DIAGNOSIS — E1165 Type 2 diabetes mellitus with hyperglycemia: Secondary | ICD-10-CM | POA: Diagnosis not present

## 2015-03-12 DIAGNOSIS — F329 Major depressive disorder, single episode, unspecified: Secondary | ICD-10-CM | POA: Diagnosis not present

## 2015-04-03 ENCOUNTER — Encounter: Payer: Medicare Other | Admitting: Family Medicine

## 2015-04-24 ENCOUNTER — Encounter: Payer: Medicare Other | Admitting: Family Medicine

## 2015-04-27 ENCOUNTER — Encounter: Payer: Medicare Other | Admitting: Family Medicine

## 2015-05-20 ENCOUNTER — Other Ambulatory Visit: Payer: Self-pay | Admitting: *Deleted

## 2015-05-20 MED ORDER — AMLODIPINE BESYLATE 2.5 MG PO TABS: 2.5000 mg | ORAL_TABLET | Freq: Every day | ORAL | Status: DC

## 2015-05-20 NOTE — Telephone Encounter (Signed)
Received fax requesting refill on Norvasc.   Refill appropriate and filled per protocol.  

## 2015-07-17 ENCOUNTER — Other Ambulatory Visit: Payer: Self-pay | Admitting: *Deleted

## 2015-07-17 MED ORDER — EZETIMIBE 10 MG PO TABS: 10.0000 mg | ORAL_TABLET | Freq: Every day | ORAL | Status: DC

## 2015-07-17 NOTE — Telephone Encounter (Signed)
Received fax requesting refill on Zetia.   Refill appropriate and filled per protocol. 

## 2015-07-27 ENCOUNTER — Other Ambulatory Visit: Payer: Self-pay | Admitting: Family Medicine

## 2015-07-27 MED ORDER — EXEMESTANE 25 MG PO TABS: 25.0000 mg | ORAL_TABLET | Freq: Every day | ORAL | Status: DC

## 2015-08-05 ENCOUNTER — Ambulatory Visit (INDEPENDENT_AMBULATORY_CARE_PROVIDER_SITE_OTHER): Payer: Medicare Other | Admitting: Family Medicine

## 2015-08-05 ENCOUNTER — Encounter: Payer: Self-pay | Admitting: Family Medicine

## 2015-08-05 DIAGNOSIS — E0841 Diabetes mellitus due to underlying condition with diabetic mononeuropathy: Secondary | ICD-10-CM

## 2015-08-05 DIAGNOSIS — E669 Obesity, unspecified: Secondary | ICD-10-CM

## 2015-08-05 DIAGNOSIS — E1143 Type 2 diabetes mellitus with diabetic autonomic (poly)neuropathy: Secondary | ICD-10-CM

## 2015-08-05 DIAGNOSIS — IMO0002 Reserved for concepts with insufficient information to code with codable children: Secondary | ICD-10-CM

## 2015-08-05 DIAGNOSIS — E785 Hyperlipidemia, unspecified: Secondary | ICD-10-CM

## 2015-08-05 DIAGNOSIS — E1365 Other specified diabetes mellitus with hyperglycemia: Secondary | ICD-10-CM | POA: Diagnosis not present

## 2015-08-05 DIAGNOSIS — I10 Essential (primary) hypertension: Secondary | ICD-10-CM

## 2015-08-05 DIAGNOSIS — E119 Type 2 diabetes mellitus without complications: Secondary | ICD-10-CM

## 2015-08-05 LAB — COMPREHENSIVE METABOLIC PANEL
ALK PHOS: 68 U/L (ref 33–130)
ALT: 23 U/L (ref 6–29)
AST: 17 U/L (ref 10–35)
Albumin: 4.1 g/dL (ref 3.6–5.1)
BILIRUBIN TOTAL: 0.4 mg/dL (ref 0.2–1.2)
BUN: 15 mg/dL (ref 7–25)
CALCIUM: 9.6 mg/dL (ref 8.6–10.4)
CO2: 30 mmol/L (ref 20–31)
Chloride: 100 mmol/L (ref 98–110)
Creat: 0.95 mg/dL — ABNORMAL HIGH (ref 0.60–0.93)
GLUCOSE: 267 mg/dL — AB (ref 70–99)
POTASSIUM: 5 mmol/L (ref 3.5–5.3)
Sodium: 139 mmol/L (ref 135–146)
Total Protein: 7.4 g/dL (ref 6.1–8.1)

## 2015-08-05 LAB — LIPID PANEL
CHOLESTEROL: 105 mg/dL — AB (ref 125–200)
HDL: 32 mg/dL — ABNORMAL LOW (ref >=46)
LDL Cholesterol: 40 mg/dL (ref <130)
Total CHOL/HDL Ratio: 3.3 Ratio (ref <=5.0)
Triglycerides: 166 mg/dL — ABNORMAL HIGH (ref <150)
VLDL: 33 mg/dL — AB (ref <30)

## 2015-08-05 LAB — CBC WITH DIFFERENTIAL/PLATELET
Basophils Absolute: 0.1 10*3/uL (ref 0.0–0.1)
Basophils Relative: 1 % (ref 0–1)
EOS ABS: 0.3 10*3/uL (ref 0.0–0.7)
EOS PCT: 5 % (ref 0–5)
HEMATOCRIT: 39.2 % (ref 36.0–46.0)
Hemoglobin: 12.4 g/dL (ref 12.0–15.0)
LYMPHS ABS: 2.1 10*3/uL (ref 0.7–4.0)
LYMPHS PCT: 32 % (ref 12–46)
MCH: 27.3 pg (ref 26.0–34.0)
MCHC: 31.6 g/dL (ref 30.0–36.0)
MCV: 86.2 fL (ref 78.0–100.0)
MONO ABS: 0.6 10*3/uL (ref 0.1–1.0)
MONOS PCT: 9 % (ref 3–12)
MPV: 9.6 fL (ref 8.6–12.4)
Neutro Abs: 3.4 10*3/uL (ref 1.7–7.7)
Neutrophils Relative %: 53 % (ref 43–77)
PLATELETS: 287 10*3/uL (ref 150–400)
RBC: 4.55 MIL/uL (ref 3.87–5.11)
RDW: 16.8 % — ABNORMAL HIGH (ref 11.5–15.5)
WBC: 6.5 10*3/uL (ref 4.0–10.5)

## 2015-08-05 LAB — HEMOGLOBIN A1C
Hgb A1c MFr Bld: 10.5 % — ABNORMAL HIGH (ref <5.7)
Mean Plasma Glucose: 255 mg/dL — ABNORMAL HIGH (ref <117)

## 2015-08-05 MED ORDER — FLUOXETINE HCL 40 MG PO CAPS: 40.0000 mg | ORAL_CAPSULE | Freq: Every day | ORAL | Status: DC

## 2015-08-05 MED ORDER — INSULIN GLARGINE 100 UNITS/ML SOLOSTAR PEN: 60.0000 [IU] | PEN_INJECTOR | Freq: Every day | SUBCUTANEOUS | Status: DC

## 2015-08-05 MED ORDER — METFORMIN HCL 1000 MG PO TABS: 1000.0000 mg | ORAL_TABLET | Freq: Two times a day (BID) | ORAL | Status: DC

## 2015-08-05 MED ORDER — EXEMESTANE 25 MG PO TABS: 25.0000 mg | ORAL_TABLET | Freq: Every day | ORAL | Status: DC

## 2015-08-05 MED ORDER — OMEPRAZOLE 20 MG PO CPDR: 20.0000 mg | DELAYED_RELEASE_CAPSULE | Freq: Two times a day (BID) | ORAL | Status: DC

## 2015-08-05 MED ORDER — OXYBUTYNIN CHLORIDE 5 MG PO TABS: 5.0000 mg | ORAL_TABLET | Freq: Two times a day (BID) | ORAL | Status: DC

## 2015-08-05 MED ORDER — GABAPENTIN 300 MG PO CAPS: 300.0000 mg | ORAL_CAPSULE | Freq: Three times a day (TID) | ORAL | Status: DC

## 2015-08-05 NOTE — Assessment & Plan Note (Signed)
Blood pressure is well-controlled and change her medication. 

## 2015-08-05 NOTE — Progress Notes (Signed)
    Subjective:    Patient ID: Kristen [name], female    DOB: 05/30/1946, 70 y.o.   MRN: [id number]  Patient presents for Medication Review/ Refill  patient here to follow-up chronic medical problems. Diabetes mellitus her last A1cwas 8.7%, her lipid panel showed an LDL at 34 this was 7 months ago, at that time I decreased her lipitor to 20mg  at bedtime  .she is due for repeat fasting labs.she did not bring her blood sugar readings with her but states that they have been good  She is taking Lantus 60   Units at bedtime   Reviewed last Oncology note   Review Of Systems:  GEN- denies fatigue, fever, weight loss,weakness, recent illness HEENT- denies eye drainage, change in vision, nasal discharge, CVS- denies chest pain, palpitations RESP- denies SOB, cough, wheeze ABD- denies N/V, change in stools, abd pain GU- denies dysuria, hematuria, dribbling, incontinence MSK- denies joint pain, muscle aches, injury Neuro- denies headache, dizziness, syncope, seizure activity       Objective:    BP 132/68 mmHg  Pulse 70  Temp(Src) 98.2 F (36.8 C) (Oral)  Resp 14  Ht 5\' 4"  (1.626 m)  Wt 192 lb (87.091 kg)  BMI 32.94 kg/m2 GEN- NAD, alert and oriented x3 HEENT- PERRL, EOMI, non injected sclera, pink conjunctiva, MMM, oropharynx clear Neck- Supple, no thyromegaly CVS- RRR, no murmur RESP-CTAB EXT- No edema Pulses- Radial, DP- 2+        Assessment & Plan:      Problem List Items Addressed This Visit    Obesity   Relevant Medications   insulin glargine (LANTUS) 100 unit/mL SOPN   metFORMIN (GLUCOPHAGE) 1000 MG tablet   Hyperlipidemia    She was decreased to 20 mg of Lipitor at her last visit to recheck her LDL      Relevant Orders   Lipid panel   Essential hypertension    Blood pressure is well-controlled and change her medication      DM (diabetes mellitus), secondary uncontrolled (HCC)    Goals A1c less than 8%. Her diabetes has been uncontrolled for quite some time.  She did not bring her meter so I cannot make the assumption based on her control at this time. She will continue the Lantus 60 units as well as the metformin      Relevant Medications   insulin glargine (LANTUS) 100 unit/mL SOPN   metFORMIN (GLUCOPHAGE) 1000 MG tablet   Other Relevant Orders   CBC with Differential/Platelet   Comprehensive metabolic panel   Hemoglobin A1c   HM DIABETES FOOT EXAM (Completed)   Diabetic neuropathy (HCC) - Primary   Relevant Medications   insulin glargine (LANTUS) 100 unit/mL SOPN   gabapentin (NEURONTIN) 300 MG capsule   metFORMIN (GLUCOPHAGE) 1000 MG tablet    Other Visit Diagnoses    Diabetes mellitus without complication ([place])        Relevant Medications    insulin glargine (LANTUS) 100 unit/mL SOPN    metFORMIN (GLUCOPHAGE) 1000 MG tablet       Note: This dictation was prepared with Dragon dictation along with smaller phrase technology. Any transcriptional errors that result from this process are unintentional.

## 2015-08-05 NOTE — Assessment & Plan Note (Signed)
She was decreased to 20 mg of Lipitor at her last visit to recheck her LDL

## 2015-08-05 NOTE — Assessment & Plan Note (Signed)
Goals A1c less than 8%. Her diabetes has been uncontrolled for quite some time. She did not bring her meter so I cannot make the assumption based on her control at this time. She will continue the Lantus 60 units as well as the metformin

## 2015-08-05 NOTE — Patient Instructions (Addendum)
Continue Lantus 60 units   Continue all other medications F/U 4 months

## 2015-08-07 ENCOUNTER — Other Ambulatory Visit: Payer: Self-pay | Admitting: *Deleted

## 2015-08-07 MED ORDER — INSULIN ASPART 100 UNIT/ML FLEXPEN: 5.0000 [IU] | PEN_INJECTOR | Freq: Three times a day (TID) | SUBCUTANEOUS | Status: DC

## 2015-09-01 ENCOUNTER — Encounter: Payer: Self-pay | Admitting: Family Medicine

## 2015-09-01 ENCOUNTER — Ambulatory Visit (INDEPENDENT_AMBULATORY_CARE_PROVIDER_SITE_OTHER): Payer: Medicare Other | Admitting: Family Medicine

## 2015-09-01 VITALS — BP 132/84 | HR 78 | Temp 98.4°F | Resp 12 | Ht 64.0 in | Wt 194.0 lb

## 2015-09-01 DIAGNOSIS — E1365 Other specified diabetes mellitus with hyperglycemia: Secondary | ICD-10-CM | POA: Diagnosis not present

## 2015-09-01 DIAGNOSIS — E119 Type 2 diabetes mellitus without complications: Secondary | ICD-10-CM | POA: Diagnosis not present

## 2015-09-01 DIAGNOSIS — IMO0002 Reserved for concepts with insufficient information to code with codable children: Secondary | ICD-10-CM

## 2015-09-01 MED ORDER — INSULIN ASPART 100 UNIT/ML FLEXPEN: PEN_INJECTOR | SUBCUTANEOUS | Status: DC

## 2015-09-01 NOTE — Patient Instructions (Signed)
Stop the junk food 1 diet soda a day  Continue lantus 60 units  Increase Novolog to 8 units to three times a day  F/U 2.31months

## 2015-09-01 NOTE — Progress Notes (Signed)
Patient ID: Kristen [name], female   DOB: 07-18-1945, 70 y.o.   MRN: [id number]   Subjective:    Patient ID: Kristen [name], female    DOB: 16-Dec-1945, 70 y.o.   MRN: [id number]  Patient presents for F/U DM Patient for interim follow-up on her diabetes mellitus. She had an A1c done 3 weeks ago which resulted at 10.5 she's currently on Lantus 60 units at bedtime and metformin and she was supposed to start NovoLog 5 units with each meal Her blood sugars now range from 140-190 in the morning before lunch 180-200 7 PM 170-180 She often snacks throughout the day instead of having a real meal. She is also drinking a lot of sugary beverages. She states that she drinks a few diet Cokes a day as well as some cranberry juice Her renal function looked okay her cholesterol looks okay she has mild elevation in her triglycerides and LDL is 40   Review Of Systems:  GEN- denies fatigue, fever, weight loss,weakness, recent illness HEENT- denies eye drainage, change in vision, nasal discharge, CVS- denies chest pain, palpitations RESP- denies SOB, cough, wheeze ABD- denies N/V, change in stools, abd pain Neuro- denies headache, dizziness, syncope, seizure activity       Objective:    BP 132/84 mmHg  Pulse 78  Temp(Src) 98.4 F (36.9 C) (Oral)  Resp 12  Ht 5\' 4"  (1.626 m)  Wt 194 lb (87.998 kg)  BMI 33.28 kg/m2 GEN- NAD, alert and oriented x3 CVS- RRR, no murmur RESP-CTAB EXT- No edema Pulses- Radial 2+        Assessment & Plan:      Problem List Items Addressed This Visit    DM (diabetes mellitus), secondary uncontrolled ([place])    Discussed importance of her regular meal times and avoid snacking during the day. She is eating a lot of sugary snacks and high carb snack such as Knapp crackers. On increase her mealtime insulin to 8 units we will continue the Lantus at the current dose my goal is to get her A1c below 8%. Her niece was also present      Relevant Medications   insulin aspart  (NOVOLOG FLEXPEN) 100 UNIT/ML FlexPen    Other Visit Diagnoses    Diabetes mellitus without complication ([place])    -  Primary    Relevant Medications    insulin aspart (NOVOLOG FLEXPEN) 100 UNIT/ML FlexPen       Note: This dictation was prepared with Dragon dictation along with smaller phrase technology. Any transcriptional errors that result from this process are unintentional.

## 2015-09-01 NOTE — Assessment & Plan Note (Addendum)
Discussed importance of her regular meal times and avoid snacking during the day. She is eating a lot of sugary snacks and high carb snack such as crackers. On increase her mealtime insulin to 8 units we will continue the Lantus at the current dose my goal is to get her A1c below 8%. Her niece was also present Her previous fasting labs are reviewed in detail

## 2015-09-08 ENCOUNTER — Telehealth: Payer: Self-pay | Admitting: Family Medicine

## 2015-09-08 NOTE — Telephone Encounter (Signed)
[facility] calling regarding the novalog request that they received  Please call them at [phone]

## 2015-09-08 NOTE — Telephone Encounter (Signed)
Call placed to pharmacy.   Advised that patient is being titrated up on Novolog depending on how her FSBS are running.

## 2015-09-09 ENCOUNTER — Encounter: Payer: Self-pay | Admitting: Family Medicine

## 2015-11-10 ENCOUNTER — Encounter: Payer: Self-pay | Admitting: Internal Medicine

## 2015-11-11 ENCOUNTER — Other Ambulatory Visit: Payer: Self-pay | Admitting: *Deleted

## 2015-11-11 MED ORDER — AMLODIPINE BESYLATE 2.5 MG PO TABS: 2.5000 mg | ORAL_TABLET | Freq: Every day | ORAL | Status: DC

## 2015-11-11 NOTE — Telephone Encounter (Signed)
Received fax requesting refill on Norvasc.   Refill appropriate and filled per protocol.  

## 2015-11-13 ENCOUNTER — Ambulatory Visit: Payer: Medicare Other | Admitting: Family Medicine

## 2015-12-02 ENCOUNTER — Encounter: Payer: Self-pay | Admitting: Internal Medicine

## 2015-12-16 ENCOUNTER — Other Ambulatory Visit: Payer: Self-pay | Admitting: Family Medicine

## 2015-12-17 NOTE — Telephone Encounter (Signed)
Refill appropriate and filled per protocol. 

## 2016-01-29 ENCOUNTER — Ambulatory Visit (AMBULATORY_SURGERY_CENTER): Payer: Self-pay | Admitting: *Deleted

## 2016-01-29 VITALS — Ht 63.0 in | Wt 195.0 lb

## 2016-01-29 DIAGNOSIS — Z860100 Personal history of colon polyps, unspecified: Secondary | ICD-10-CM

## 2016-01-29 NOTE — Progress Notes (Signed)
No egg or soy allergy known to patient  No issues with past sedation with any surgeries  or procedures, no intubation problems  No diet pills per patient No home 02 use per patient  No blood thinners per patient  Pt denies issues with constipation  No A fib or flutter  Daughter in Pv today with patient

## 2016-02-12 ENCOUNTER — Encounter: Payer: Self-pay | Admitting: Internal Medicine

## 2016-02-12 ENCOUNTER — Ambulatory Visit (AMBULATORY_SURGERY_CENTER): Payer: Medicare Other | Admitting: Internal Medicine

## 2016-02-12 VITALS — BP 129/72 | HR 56 | Temp 97.1°F | Resp 10 | Ht 63.0 in | Wt 195.0 lb

## 2016-02-12 DIAGNOSIS — K625 Hemorrhage of anus and rectum: Secondary | ICD-10-CM | POA: Diagnosis not present

## 2016-02-12 DIAGNOSIS — Z860100 Personal history of colon polyps, unspecified: Secondary | ICD-10-CM

## 2016-02-12 DIAGNOSIS — I10 Essential (primary) hypertension: Secondary | ICD-10-CM | POA: Diagnosis not present

## 2016-02-12 DIAGNOSIS — E669 Obesity, unspecified: Secondary | ICD-10-CM | POA: Diagnosis not present

## 2016-02-12 DIAGNOSIS — E119 Type 2 diabetes mellitus without complications: Secondary | ICD-10-CM | POA: Diagnosis not present

## 2016-02-12 DIAGNOSIS — D124 Benign neoplasm of descending colon: Secondary | ICD-10-CM

## 2016-02-12 DIAGNOSIS — I2510 Atherosclerotic heart disease of native coronary artery without angina pectoris: Secondary | ICD-10-CM | POA: Diagnosis not present

## 2016-02-12 HISTORY — DX: Personal history of colon polyps, unspecified: Z86.0100

## 2016-02-12 HISTORY — DX: Personal history of colonic polyps: Z86.010

## 2016-02-12 LAB — GLUCOSE, CAPILLARY
Glucose-Capillary: 156 mg/dL — ABNORMAL HIGH (ref 65–99)
Glucose-Capillary: 161 mg/dL — ABNORMAL HIGH (ref 65–99)

## 2016-02-12 MED ORDER — SODIUM CHLORIDE 0.9 % IV SOLN: 500.0000 mL | INTRAVENOUS | Status: DC

## 2016-02-12 NOTE — Progress Notes (Signed)
Called to room to assist during endoscopic procedure.  Patient ID and intended procedure confirmed with present staff. Received instructions for my participation in the procedure from the performing physician.  

## 2016-02-12 NOTE — Progress Notes (Signed)
To recovery awake alert, vss report to RN

## 2016-02-12 NOTE — Op Note (Signed)
[facility] Patient Name: Kristen [name] Procedure Date: 02/12/2016 7:55 AM MRN: [id number] Endoscopist: Gatha Mayer , MD Age: 70 Referring MD:  Date of Birth: 26-Jun-1945 Gender: Female Account #: [id number] Procedure:                Colonoscopy Indications:              Surveillance: Personal history of adenomatous                            polyps on last colonoscopy 5 years ago Medicines:                Propofol per Anesthesia, Monitored Anesthesia Care Procedure:                Pre-Anesthesia Assessment:                           - Prior to the procedure, a History and Physical                            was performed, and patient medications and                            allergies were reviewed. The patient's tolerance of                            previous anesthesia was also reviewed. The risks                            and benefits of the procedure and the sedation                            options and risks were discussed with the patient.                            All questions were answered, and informed consent                            was obtained. Prior Anticoagulants: The patient                            last took aspirin 1 day prior to the procedure. ASA                            Grade Assessment: III - A patient with severe                            systemic disease. After reviewing the risks and                            benefits, the patient was deemed in satisfactory                            condition to undergo the procedure.  After obtaining informed consent, the colonoscope                            was passed under direct vision. Throughout the                            procedure, the patient's blood pressure, pulse, and                            oxygen saturations were monitored continuously. The                            Model PCF-H190L [id number]) scope was introduced                            through  the anus and advanced to the the cecum,                            identified by appendiceal orifice and ileocecal                            valve. The ileocecal valve, appendiceal orifice,                            and rectum were photographed. The colonoscopy was                            performed without difficulty. The patient tolerated                            the procedure well. The quality of the bowel                            preparation was good. The bowel preparation used                            was Miralax. Scope In: 8:00:24 AM Scope Out: 8:19:11 AM Scope Withdrawal Time: 0 hours 13 minutes 50 seconds  Total Procedure Duration: 0 hours 18 minutes 47 seconds  Findings:                 The perianal and digital rectal examinations were                            normal.                           A 5 mm polyp was found in the descending colon. The                            polyp was sessile. The polyp was removed with a                            cold snare. Resection and retrieval were complete.  Verification of patient identification for the                            specimen was done. Estimated blood loss was minimal.                           Internal hemorrhoids were found during retroflexion.                           The exam was otherwise without abnormality on                            direct and retroflexion views. Complications:            No immediate complications. Estimated Blood Loss:     Estimated blood loss was minimal. Impression:               - One 5 mm polyp in the descending colon, removed                            with a cold snare. Resected and retrieved.                           - Internal hemorrhoids.                           - The examination was otherwise normal on direct                            and retroflexion views.                           - Personal history of colonic polyps. 2 small                             adenomas 2012 Recommendation:           - Patient has a contact number available for                            emergencies. The signs and symptoms of potential                            delayed complications were discussed with the                            patient. Return to normal activities tomorrow.                            Written discharge instructions were provided to the                            patient.                           - Resume previous diet.                           -  Continue present medications.                           - Repeat colonoscopy is recommended. The                            colonoscopy date will be determined after pathology                            results from today's exam become available for                            review. Gatha Mayer, MD 02/12/2016 8:29:42 AM This report has been signed electronically.

## 2016-02-12 NOTE — Patient Instructions (Addendum)
I found and removed one small polyp. I will let you know pathology results and when to have another routine colonoscopy by mail.  You also have internal hemorrhoids. If you have hemorrhoid problems (swelling, itching, bleeding) I am able to treat those with an in-office procedure. If you like, please call my office at [phone] to schedule an appointment and I can evaluate you further.   I appreciate the opportunity to care for you. Gatha Mayer, MD, FACG  YOU HAD AN ENDOSCOPIC PROCEDURE TODAY AT [facility]:   Refer to the procedure report that was given to you for any specific questions about what was found during the examination.  If the procedure report does not answer your questions, please call your gastroenterologist to clarify.  If you requested that your care partner not be given the details of your procedure findings, then the procedure report has been included in a sealed envelope for you to review at your convenience later.  YOU SHOULD EXPECT: Some feelings of bloating in the abdomen. Passage of more gas than usual.  Walking can help get rid of the air that was put into your GI tract during the procedure and reduce the bloating. If you had a lower endoscopy (such as a colonoscopy or flexible sigmoidoscopy) you may notice spotting of blood in your stool or on the toilet paper. If you underwent a bowel prep for your procedure, you may not have a normal bowel movement for a few days.  Please Note:  You might notice some irritation and congestion in your nose or some drainage.  This is from the oxygen used during your procedure.  There is no need for concern and it should clear up in a day or so.  SYMPTOMS TO REPORT IMMEDIATELY:   Following lower endoscopy (colonoscopy or flexible sigmoidoscopy):  Excessive amounts of blood in the stool  Significant tenderness or worsening of abdominal pains  Swelling of the abdomen that is new, acute  Fever of 100F or  higher  For urgent or emergent issues, a gastroenterologist can be reached at any hour by calling [phone].   DIET:  We do recommend a small meal at first, but then you may proceed to your regular diet.  Drink plenty of fluids but you should avoid alcoholic beverages for 24 hours.  ACTIVITY:  You should plan to take it easy for the rest of today and you should NOT DRIVE or use heavy machinery until tomorrow (because of the sedation medicines used during the test).    FOLLOW UP: Our staff will call the number listed on your records the next business day following your procedure to check on you and address any questions or concerns that you may have regarding the information given to you following your procedure. If we do not reach you, we will leave a message.  However, if you are feeling well and you are not experiencing any problems, there is no need to return our call.  We will assume that you have returned to your regular daily activities without incident.  If any biopsies were taken you will be contacted by phone or by letter within the next 1-3 weeks.  Please call us at [phone] if you have not heard about the biopsies in 3 weeks.    SIGNATURES/CONFIDENTIALITY: You and/or your care partner have signed paperwork which will be entered into your electronic medical record.  These signatures attest to the fact that that the information above on  your After Visit Summary has been reviewed and is understood.  Full responsibility of the confidentiality of this discharge information lies with you and/or your care-partner.

## 2016-02-16 ENCOUNTER — Telehealth: Payer: Self-pay | Admitting: *Deleted

## 2016-02-16 NOTE — Telephone Encounter (Signed)
  Follow up Call-  Call back number 02/12/2016  Post procedure Call Back phone  # [phone]  Permission to leave phone message No  Some recent data might be hidden     Patient questions:  Message left to call us if necessary.

## 2016-02-18 ENCOUNTER — Encounter: Payer: Self-pay | Admitting: Internal Medicine

## 2016-02-18 DIAGNOSIS — Z86010 Personal history of colonic polyps: Secondary | ICD-10-CM

## 2016-02-18 NOTE — Progress Notes (Signed)
Prolapse type polyp 2 diminutive adenomas 2012 Next colonoscopy should be 10 yrs but she would be 80 so no recall

## 2016-03-08 ENCOUNTER — Other Ambulatory Visit: Payer: Self-pay | Admitting: Family Medicine

## 2016-03-21 ENCOUNTER — Telehealth: Payer: Self-pay | Admitting: *Deleted

## 2016-03-21 NOTE — Telephone Encounter (Signed)
Received call from patient inquiring when her appointment with Dr. Lindi Adie is.  Informed her she was scheduled for 10/12 at 11am.  She would like for me to call her daughter as well, because she brings her and has to work around her schedule.    Spoke with Hurshel Keys daughter who states she will need to reschedule this appointment due to work.Marland Kitchen Rescheduled her appointment for 03/25/16 at 1030am.

## 2016-03-24 ENCOUNTER — Ambulatory Visit: Payer: Medicare Other | Admitting: Hematology and Oncology

## 2016-03-25 ENCOUNTER — Ambulatory Visit (HOSPITAL_BASED_OUTPATIENT_CLINIC_OR_DEPARTMENT_OTHER): Payer: Medicare Other | Admitting: Hematology and Oncology

## 2016-03-25 ENCOUNTER — Other Ambulatory Visit: Payer: Self-pay | Admitting: Hematology and Oncology

## 2016-03-25 ENCOUNTER — Encounter: Payer: Self-pay | Admitting: Hematology and Oncology

## 2016-03-25 DIAGNOSIS — C50412 Malignant neoplasm of upper-outer quadrant of left female breast: Secondary | ICD-10-CM | POA: Diagnosis not present

## 2016-03-25 DIAGNOSIS — Z1231 Encounter for screening mammogram for malignant neoplasm of breast: Secondary | ICD-10-CM

## 2016-03-25 DIAGNOSIS — Z79811 Long term (current) use of aromatase inhibitors: Secondary | ICD-10-CM

## 2016-03-25 DIAGNOSIS — Z9012 Acquired absence of left breast and nipple: Secondary | ICD-10-CM

## 2016-03-25 DIAGNOSIS — Z170 Estrogen receptor positive status [ER+]: Secondary | ICD-10-CM

## 2016-03-25 MED ORDER — EXEMESTANE 25 MG PO TABS: 25.0000 mg | ORAL_TABLET | Freq: Every day | ORAL | 3 refills | Status: DC

## 2016-03-25 NOTE — Assessment & Plan Note (Signed)
Left breast invasive ductal carcinoma with DCIS T1, N0, M0 stage IA ER/PR positive HER-2 negative status post right mastectomy currently on adjuvant hormonal therapy with Aromasin since 01/11/2013.  Aromasin toxicities: She is tolerating it very well without any major problems or concerns. Denies any hot flashes, achiness, denies any vaginal bleeding or muscle aches or pains.  Breast cancerSurveillance:  1. Breast exam 03/25/2016 is normal 2. Mammogram and bone density were reviewed with the patient and they were normal done on 03/03/2014. Mammograms will need to be done 3. Bone density showed a T score of -0.9. Done on 07/02/2014. This will be repeated in 2018.  Prior hospitalization for syncope: Felt to be related to blood pressure medications and UTI  Return to clinic in 1 year for follow-up breast exams.

## 2016-03-25 NOTE — Progress Notes (Signed)
Patient Care Team: Alycia Rossetti, MD as PCP - General (Family Medicine)  DIAGNOSIS: Malignant neoplasm of upper-outer quadrant of left female breast [facility])   Staging form: Breast, AJCC 7th Edition   - Pathologic: Stage IA (T1a, N0, cM0) - Signed by Rulon Eisenmenger, MD on 01/31/2014  SUMMARY OF ONCOLOGIC HISTORY:   Malignant neoplasm of upper-outer quadrant of left female breast ([place])   11/30/2012 Initial Diagnosis    Malignant neoplasm of upper-outer quadrant of left female breast: Left breast bloody nipple discharge that led to mammogram. Initial biopsy revealed DCIS ER/PR positive      12/24/2012 Surgery    Left mastectomy with sentinel lymph node biopsy. Invasive ductal carcinoma grade 2 with 2 foci each measuring less than 0.1 cm with high-grade DCIS 2.6 cm and 1.4 cm 6 sentinel nodes negative ER 100% PR 100% HER-2 negative Ki-67 14%      01/31/2013 -  Anti-estrogen oral therapy    Aromasin 25 mg once daily       CHIEF COMPLIANT: Follow-up on Aromasin  INTERVAL HISTORY: Kristen [name] is a 70 year old with above-mentioned history left breast cancer treated with mastectomy and is currently on Aromasin. She is tolerating it extremely well. She does have hot flashes several times in the day especially at bedtime. She also has muscle stiffness and achiness. She is managing that with supportive therapy. She denies any lumps or nodules.  REVIEW OF SYSTEMS:   Constitutional: Denies fevers, chills or abnormal weight loss Eyes: Denies blurriness of vision Ears, nose, mouth, throat, and face: Denies mucositis or sore throat Respiratory: Denies cough, dyspnea or wheezes Cardiovascular: Denies palpitation, chest discomfort Gastrointestinal:  Denies nausea, heartburn or change in bowel habits Skin: Denies abnormal skin rashes Lymphatics: Denies new lymphadenopathy or easy bruising Neurological:Denies numbness, tingling or new weaknesses Behavioral/Psych: Mood is stable, no new changes    Extremities: No lower extremity edema Breast:  denies any pain or lumps or nodules in either breasts All other systems were reviewed with the patient and are negative.  I have reviewed the past medical history, past surgical history, social history and family history with the patient and they are unchanged from previous note.  ALLERGIES:  is allergic to sulfonamide derivatives.  MEDICATIONS:  Current Outpatient Prescriptions  Medication Sig Dispense Refill  . amLODipine (NORVASC) 2.5 MG tablet Take 1 tablet (2.5 mg total) by mouth daily. 90 tablet 1  . aspirin EC 81 MG tablet Take 81 mg by mouth daily.    Marland Kitchen atorvastatin (LIPITOR) 40 MG tablet TAKE 1 TABLET BY MOUTH DAILY AT 6 PM 90 tablet 0  . bisacodyl (DULCOLAX) 5 MG EC tablet Take 5 mg by mouth once. X 4 for colon 9-1 Friday    . blood glucose meter kit and supplies KIT Dispense based on patient and insurance preference. Use to monitor FSBS 3x daily. Dx: E11.41. 1 each 0  . Docusate Calcium (STOOL SOFTENER PO) Take 2 capsules by mouth every evening.     Marland Kitchen exemestane (AROMASIN) 25 MG tablet Take 1 tablet (25 mg total) by mouth daily after breakfast. 90 tablet 2  . ezetimibe (ZETIA) 10 MG tablet Take 1 tablet (10 mg total) by mouth daily. 90 tablet 2  . FLUoxetine (PROZAC) 40 MG capsule Take 1 capsule (40 mg total) by mouth daily. 90 capsule 2  . gabapentin (NEURONTIN) 300 MG capsule Take 1 capsule (300 mg total) by mouth 3 (three) times daily. 270 capsule 3  . glucose blood test  strip Dispense based on patient and insurance preference. Use to monitor FSBS 3x daily. Dx: E11.41. 300 each 3  . ibuprofen (ADVIL,MOTRIN) 200 MG tablet Take 200 mg by mouth every 6 (six) hours as needed for pain.    Marland Kitchen insulin aspart (NOVOLOG FLEXPEN) 100 UNIT/ML FlexPen Inject 5- 50 units TID as directed with meals. 45 mL 11  . insulin glargine (LANTUS) 100 unit/mL SOPN Inject 0.6 mLs (60 Units total) into the skin at bedtime. 45 mL 2  . Lancets Misc. KIT  Dispense based on patient and insurance preference. Use to monitor FSBS 3x daily. Dx: E11.41. 1 each 0  . Lancets MISC Dispense based on patient and insurance preference. Use to monitor FSBS 3x daily. Dx: E11.41. 300 each 3  . metFORMIN (GLUCOPHAGE) 1000 MG tablet Take 1 tablet (1,000 mg total) by mouth 2 (two) times daily with a meal. 180 tablet 2  . omeprazole (PRILOSEC) 20 MG capsule Take 1 capsule (20 mg total) by mouth 2 (two) times daily. 180 capsule 2  . oxybutynin (DITROPAN) 5 MG tablet Take 1 tablet (5 mg total) by mouth 2 (two) times daily. 180 tablet 2  . polyethylene glycol powder (MIRALAX) powder Take 1 Container by mouth once. 2387 grams for colon 9-1     Current Facility-Administered Medications  Medication Dose Route Frequency Provider Last Rate Last Dose  . 0.9 %  sodium chloride infusion  500 mL Intravenous Continuous Gatha Mayer, MD        PHYSICAL EXAMINATION: ECOG PERFORMANCE STATUS: 1 - Symptomatic but completely ambulatory  Vitals:   03/25/16 1044  BP: 135/68  Pulse: 64  Resp: 18  Temp: 97.8 F (36.6 C)   Filed Weights   03/25/16 1044  Weight: 196 lb 8 oz (89.1 kg)    GENERAL:alert, no distress and comfortable SKIN: skin color, texture, turgor are normal, no rashes or significant lesions EYES: normal, Conjunctiva are pink and non-injected, sclera clear OROPHARYNX:no exudate, no erythema and lips, buccal mucosa, and tongue normal  NECK: supple, thyroid normal size, non-tender, without nodularity LYMPH:  no palpable lymphadenopathy in the cervical, axillary or inguinal LUNGS: clear to auscultation and percussion with normal breathing effort HEART: regular rate & rhythm and no murmurs and no lower extremity edema ABDOMEN:abdomen soft, non-tender and normal bowel sounds MUSCULOSKELETAL:no cyanosis of digits and no clubbing  NEURO: alert & oriented x 3 with fluent speech, no focal motor/sensory deficits EXTREMITIES: No lower extremity edema BREAST: No  palpable lumps or nodules in the right breast or axilla. Left chest wall and axilla are without any nodularity or lymphadenopathy. (exam performed in the presence of a chaperone)  LABORATORY DATA:  I have reviewed the data as listed   Chemistry      Component Value Date/Time   NA 139 08/05/2015 1039   NA 137 02/26/2015 1059   K 5.0 08/05/2015 1039   K 5.3 (H) 02/26/2015 1059   CL 100 08/05/2015 1039   CO2 30 08/05/2015 1039   CO2 25 02/26/2015 1059   BUN 15 08/05/2015 1039   BUN 25.8 02/26/2015 1059   CREATININE 0.95 (H) 08/05/2015 1039   CREATININE 1.4 (H) 02/26/2015 1059      Component Value Date/Time   CALCIUM 9.6 08/05/2015 1039   CALCIUM 9.6 02/26/2015 1059   ALKPHOS 68 08/05/2015 1039   ALKPHOS 92 02/26/2015 1059   AST 17 08/05/2015 1039   AST 12 02/26/2015 1059   ALT 23 08/05/2015 1039   ALT 19 02/26/2015  1059   BILITOT 0.4 08/05/2015 1039   BILITOT 0.47 02/26/2015 1059       Lab Results  Component Value Date   WBC 6.5 08/05/2015   HGB 12.4 08/05/2015   HCT 39.2 08/05/2015   MCV 86.2 08/05/2015   PLT 287 08/05/2015   NEUTROABS 3.4 08/05/2015     ASSESSMENT & PLAN:  Malignant neoplasm of upper-outer quadrant of left female breast Left breast invasive ductal carcinoma with DCIS T1, N0, M0 stage IA ER/PR positive HER-2 negative status post right mastectomy currently on adjuvant hormonal therapy with Aromasin since 01/11/2013.  Aromasin toxicities: She is tolerating it very well without any major problems or concerns. Denies any hot flashes, achiness, denies any vaginal bleeding or muscle aches or pains.  Breast cancerSurveillance:  1. Breast exam 03/25/2016 is normal 2. Mammogram and bone density were reviewed with the patient and they were normal done on 03/03/2014. Mammograms will need to be done. I sent an order for mammograms. 3. Bone density showed a T score of -0.9. Done on 07/02/2014. This will be repeated in 2018.  Prior hospitalization for  syncope: Felt to be related to blood pressure medications and UTI I sent a new prescription for Aromasin today.  Return to clinic in 1 year for follow-up breast exams.   No orders of the defined types were placed in this encounter.  The patient has a good understanding of the overall plan. she agrees with it. she will call with any problems that may develop before the next visit here.   Rulon Eisenmenger, MD 03/25/16

## 2016-04-01 ENCOUNTER — Ambulatory Visit: Payer: Medicare Other

## 2016-04-11 ENCOUNTER — Other Ambulatory Visit: Payer: Self-pay | Admitting: Family Medicine

## 2016-04-11 DIAGNOSIS — E119 Type 2 diabetes mellitus without complications: Secondary | ICD-10-CM

## 2016-04-15 ENCOUNTER — Ambulatory Visit
Admission: RE | Admit: 2016-04-15 | Discharge: 2016-04-15 | Disposition: A | Discharge status: Home / Self Care | Payer: Medicare Other | Source: Ambulatory Visit | Attending: Hematology and Oncology | Admitting: Hematology and Oncology

## 2016-04-15 DIAGNOSIS — Z1231 Encounter for screening mammogram for malignant neoplasm of breast: Secondary | ICD-10-CM

## 2016-04-15 DIAGNOSIS — Z9012 Acquired absence of left breast and nipple: Secondary | ICD-10-CM

## 2016-05-10 ENCOUNTER — Other Ambulatory Visit: Payer: Self-pay | Admitting: Family Medicine

## 2016-05-11 NOTE — Telephone Encounter (Signed)
rx filled per protocol  

## 2016-05-18 ENCOUNTER — Other Ambulatory Visit: Payer: Self-pay | Admitting: Family Medicine

## 2016-05-24 ENCOUNTER — Other Ambulatory Visit: Payer: Self-pay | Admitting: Family Medicine

## 2016-05-24 DIAGNOSIS — E119 Type 2 diabetes mellitus without complications: Secondary | ICD-10-CM

## 2016-07-02 ENCOUNTER — Other Ambulatory Visit: Payer: Self-pay | Admitting: Family Medicine

## 2016-07-02 DIAGNOSIS — E0841 Diabetes mellitus due to underlying condition with diabetic mononeuropathy: Secondary | ICD-10-CM

## 2016-07-15 ENCOUNTER — Telehealth: Payer: Self-pay | Admitting: *Deleted

## 2016-07-15 MED ORDER — INSULIN DEGLUDEC 100 UNIT/ML ~~LOC~~ SOPN: 60.0000 [IU] | PEN_INJECTOR | Freq: Every day | SUBCUTANEOUS | 11 refills | Status: DC

## 2016-07-15 NOTE — Telephone Encounter (Signed)
Received fax from insurance regarding the prescription for Lantus.   Insurance will not cover this medication.   MD made aware and recommendations are as follows:  D/C Lantus.  Begin Tyler Aas.    Prescription sent to pharmacy.   Call placed to patient and patient made aware.

## 2016-07-21 ENCOUNTER — Telehealth: Payer: Self-pay | Admitting: *Deleted

## 2016-07-21 MED ORDER — INSULIN DEGLUDEC 100 UNIT/ML ~~LOC~~ SOPN: 60.0000 [IU] | PEN_INJECTOR | Freq: Every day | SUBCUTANEOUS | 11 refills | Status: DC

## 2016-07-21 NOTE — Telephone Encounter (Signed)
Received call from pharmacy.   Inquired as to if patient is to continue Lantus with [place].   Advised Lantus is not covered by insurance and will be D/C'ed.

## 2016-07-22 ENCOUNTER — Ambulatory Visit
Admission: RE | Admit: 2016-07-22 | Discharge: 2016-07-22 | Disposition: A | Discharge status: Home / Self Care | Payer: Medicare Other | Source: Ambulatory Visit | Attending: Family Medicine | Admitting: Family Medicine

## 2016-07-22 ENCOUNTER — Ambulatory Visit (INDEPENDENT_AMBULATORY_CARE_PROVIDER_SITE_OTHER): Payer: Medicare Other | Admitting: Family Medicine

## 2016-07-22 ENCOUNTER — Encounter: Payer: Self-pay | Admitting: Family Medicine

## 2016-07-22 VITALS — BP 130/72 | HR 68 | Temp 98.6°F | Resp 14 | Ht 63.0 in | Wt 199.0 lb

## 2016-07-22 DIAGNOSIS — E1143 Type 2 diabetes mellitus with diabetic autonomic (poly)neuropathy: Secondary | ICD-10-CM

## 2016-07-22 DIAGNOSIS — I10 Essential (primary) hypertension: Secondary | ICD-10-CM

## 2016-07-22 DIAGNOSIS — M159 Polyosteoarthritis, unspecified: Secondary | ICD-10-CM

## 2016-07-22 DIAGNOSIS — E0841 Diabetes mellitus due to underlying condition with diabetic mononeuropathy: Secondary | ICD-10-CM

## 2016-07-22 DIAGNOSIS — R55 Syncope and collapse: Secondary | ICD-10-CM

## 2016-07-22 DIAGNOSIS — M25561 Pain in right knee: Secondary | ICD-10-CM

## 2016-07-22 DIAGNOSIS — M150 Primary generalized (osteo)arthritis: Secondary | ICD-10-CM

## 2016-07-22 DIAGNOSIS — E1365 Other specified diabetes mellitus with hyperglycemia: Secondary | ICD-10-CM | POA: Diagnosis not present

## 2016-07-22 DIAGNOSIS — M8949 Other hypertrophic osteoarthropathy, multiple sites: Secondary | ICD-10-CM

## 2016-07-22 DIAGNOSIS — IMO0002 Reserved for concepts with insufficient information to code with codable children: Secondary | ICD-10-CM

## 2016-07-22 LAB — COMPREHENSIVE METABOLIC PANEL
ALT: 39 U/L — ABNORMAL HIGH (ref 6–29)
AST: 31 U/L (ref 10–35)
Albumin: 4.1 g/dL (ref 3.6–5.1)
Alkaline Phosphatase: 73 U/L (ref 33–130)
BUN: 16 mg/dL (ref 7–25)
CHLORIDE: 103 mmol/L (ref 98–110)
CO2: 27 mmol/L (ref 20–31)
CREATININE: 0.91 mg/dL (ref 0.60–0.93)
Calcium: 10 mg/dL (ref 8.6–10.4)
Glucose, Bld: 156 mg/dL — ABNORMAL HIGH (ref 70–99)
Potassium: 5.2 mmol/L (ref 3.5–5.3)
SODIUM: 142 mmol/L (ref 135–146)
TOTAL PROTEIN: 7.3 g/dL (ref 6.1–8.1)
Total Bilirubin: 0.5 mg/dL (ref 0.2–1.2)

## 2016-07-22 LAB — LIPID PANEL
Cholesterol: 149 mg/dL (ref <200)
HDL: 35 mg/dL — ABNORMAL LOW (ref >50)
LDL CALC: 78 mg/dL (ref <100)
Total CHOL/HDL Ratio: 4.3 Ratio (ref <5.0)
Triglycerides: 179 mg/dL — ABNORMAL HIGH (ref <150)
VLDL: 36 mg/dL — AB (ref <30)

## 2016-07-22 LAB — HEMOGLOBIN A1C, FINGERSTICK: Hgb A1C (fingerstick): 11.3 % — ABNORMAL HIGH (ref <5.7)

## 2016-07-22 LAB — CBC WITH DIFFERENTIAL/PLATELET
Basophils Absolute: 62 cells/uL (ref 0–200)
Basophils Relative: 1 %
EOS ABS: 248 {cells}/uL (ref 15–500)
Eosinophils Relative: 4 %
HEMATOCRIT: 39.7 % (ref 35.0–45.0)
HEMOGLOBIN: 12.6 g/dL (ref 12.0–15.0)
LYMPHS PCT: 34 %
Lymphs Abs: 2108 cells/uL (ref 850–3900)
MCH: 27.5 pg (ref 27.0–33.0)
MCHC: 31.7 g/dL — AB (ref 32.0–36.0)
MCV: 86.7 fL (ref 80.0–100.0)
MONO ABS: 620 {cells}/uL (ref 200–950)
MPV: 9.5 fL (ref 7.5–12.5)
Monocytes Relative: 10 %
Neutro Abs: 3162 cells/uL (ref 1500–7800)
Neutrophils Relative %: 51 %
Platelets: 298 10*3/uL (ref 140–400)
RBC: 4.58 MIL/uL (ref 3.80–5.10)
RDW: 15.8 % — AB (ref 11.0–15.0)
WBC: 6.2 10*3/uL (ref 3.8–10.8)

## 2016-07-22 LAB — TSH: TSH: 1.06 m[IU]/L

## 2016-07-22 MED ORDER — OMEPRAZOLE 20 MG PO CPDR: DELAYED_RELEASE_CAPSULE | ORAL | 2 refills | Status: DC

## 2016-07-22 MED ORDER — GABAPENTIN 300 MG PO CAPS: 300.0000 mg | ORAL_CAPSULE | Freq: Three times a day (TID) | ORAL | 2 refills | Status: DC

## 2016-07-22 MED ORDER — EZETIMIBE 10 MG PO TABS: 10.0000 mg | ORAL_TABLET | Freq: Every day | ORAL | 2 refills | Status: DC

## 2016-07-22 MED ORDER — ATORVASTATIN CALCIUM 40 MG PO TABS: ORAL_TABLET | ORAL | 2 refills | Status: DC

## 2016-07-22 MED ORDER — FLUOXETINE HCL 40 MG PO CAPS: 40.0000 mg | ORAL_CAPSULE | Freq: Every day | ORAL | 2 refills | Status: AC

## 2016-07-22 MED ORDER — OXYBUTYNIN CHLORIDE 5 MG PO TABS: 5.0000 mg | ORAL_TABLET | Freq: Two times a day (BID) | ORAL | 2 refills | Status: DC

## 2016-07-22 MED ORDER — INSULIN ASPART 100 UNIT/ML FLEXPEN: PEN_INJECTOR | SUBCUTANEOUS | 11 refills | Status: DC

## 2016-07-22 MED ORDER — METFORMIN HCL 1000 MG PO TABS: ORAL_TABLET | ORAL | 2 refills | Status: DC

## 2016-07-22 MED ORDER — AMLODIPINE BESYLATE 2.5 MG PO TABS: 2.5000 mg | ORAL_TABLET | Freq: Every day | ORAL | 2 refills | Status: DC

## 2016-07-22 MED ORDER — INSULIN DEGLUDEC 100 UNIT/ML ~~LOC~~ SOPN: 60.0000 [IU] | PEN_INJECTOR | Freq: Every day | SUBCUTANEOUS | 11 refills | Status: DC

## 2016-07-22 NOTE — Addendum Note (Signed)
Addended by: Vic Blackbird F on: 07/22/2016 01:40 PM   Modules accepted: Orders

## 2016-07-22 NOTE — Patient Instructions (Addendum)
Tresiba 65 units, Novolog 8 units  Ultrasound of neck Check blood sugar three times a day  Knee xray  Use the tylenol  F/u 2 WEEKs for diabetes

## 2016-07-22 NOTE — Progress Notes (Signed)
Subjective:    Patient ID: Kristen [name], female    DOB: Oct 12, 1945, 71 y.o.   MRN: [id number]  Patient presents for Medication Review/ Refill (is fasting) and R Knee Pain (S/P fall outdoors- states that she got dizzy and fell- has been having R knee pain since then)  pt here for follow-up she was last seen in March 2017. She is diabetes mellitus which is been uncontrolled and she has not been following up. She was last on Lantus 60 units at bedtime and NovoLog 5 units with each meal. We have discussed this multiple times with her follow-ups. States she check CBG  - highest 181 , evening 180-190's  Right knee pain-History of right knee surgery replacement 20 years ago, had fall last week, carrying trash became dizzy and fell out, bystander came by to help her. She's had pain with some mild swelling in her knee she has used ice she's also been using Tylenol and ibuprofen and this has helped She's not had any other syncopal episodes since then.  Review Of Systems:  GEN- denies fatigue, fever, weight loss,weakness, recent illness HEENT- denies eye drainage, change in vision, nasal discharge, CVS- denies chest pain, palpitations RESP- denies SOB, cough, wheeze ABD- denies N/V, change in stools, abd pain GU- denies dysuria, hematuria, dribbling, incontinence MSK+ joint pain, muscle aches, injury Neuro- denies headache, +dizziness, +syncope, seizure activity       Objective:    BP 130/72   Pulse 68   Temp 98.6 F (37 C) (Oral)   Resp 14   Ht 5\' 3"  (1.6 m)   Wt 199 lb (90.3 kg)   SpO2 97%   BMI 35.25 kg/m  GEN- NAD, alert and oriented x3 HEENT- PERRL, EOMI, non injected sclera, pink conjunctiva, MMM, oropharynx clear Neck- Supple, no thyromegaly, +right bruit  CVS- RRR, no murmur RESP-CTAB ABD-NABS,soft,NT,ND Neuro-CNII-XII in tact no deficits  MSK- Right knee- incision in tact, mild swelling inferior knee, mild yellow bruising same area, no warmth, decreased ROM bilat knees,   Skin- few small superficial abrasions on ankle/leg EXT- No edema Pulses- Radial, DP- 2+        Assessment & Plan:      Problem List Items Addressed This Visit    Osteoarthritis   Essential hypertension - Primary    Blood pressure is controlled I am concerned with her syncopal event. Her diabetes is uncontrolled this could've been the cause she did also have blockage if she has risk factors for carotid artery disease. She does not have any neurological deficits today they're from an hold on scanning her head. Her EKG is unchanged she has known first-degree AV block as well as right bundle branch block and LVH Is possible this was something cardiac the caused her event as well.  Obtain Carotids, review labs A1C is uncontrolled 11% him and increase her proceed with a 65 units at night she is to take 8 units of NovoLog with each meal. He'll follow-up in 2 weeks discussed the importance of follow-up here in the office we will check her blood sugars. She has any further dizzy spells with side on further imaging of her brain as well as cardiac workup.  Regards to her knee injury she has had a replacement but she does have some swelling. An obtain an x-ray of the knee she can use Tylenol for now which she states is helping her pain.      Relevant Orders   Comprehensive metabolic panel   CBC with  Differential/Platelet   TSH   EKG 12-Lead (Completed)   DM (diabetes mellitus), secondary uncontrolled ([place])   Relevant Orders   Hemoglobin A1C, fingerstick (Completed)   Lipid panel   Diabetic neuropathy ([place])    Other Visit Diagnoses    Syncope, unspecified syncope type       Relevant Orders   TSH   EKG 12-Lead (Completed)   Acute pain of right knee       Relevant Orders   DG Knee Complete 4 Views Right      Note: This dictation was prepared with Dragon dictation along with smaller phrase technology. Any transcriptional errors that result from this process are unintentional.

## 2016-07-22 NOTE — Assessment & Plan Note (Addendum)
Blood pressure is controlled I am concerned with her syncopal event. Her diabetes is uncontrolled this could've been the cause she did also have blockage if she has risk factors for carotid artery disease. She does not have any neurological deficits today they're from an hold on scanning her head. Her EKG is unchanged she has known first-degree AV block as well as right bundle branch block and LVH Is possible this was something cardiac the caused her event as well.  Obtain Carotids, review labs A1C is uncontrolled 11% him and increase her Tresiba to  65 units at night she is to take 8 units of NovoLog with each meal. He'll follow-up in 2 weeks discussed the importance of follow-up here in the office we will check her blood sugars. She has any further dizzy spells with side on further imaging of her brain as well as cardiac workup.  Regards to her knee injury she has had a replacement but she does have some swelling. An obtain an x-ray of the knee she can use Tylenol for now which she states is helping her pain.

## 2016-08-01 ENCOUNTER — Ambulatory Visit (INDEPENDENT_AMBULATORY_CARE_PROVIDER_SITE_OTHER): Payer: Medicare Other | Admitting: Family Medicine

## 2016-08-01 ENCOUNTER — Encounter: Payer: Self-pay | Admitting: Family Medicine

## 2016-08-01 VITALS — BP 128/78 | HR 64 | Temp 98.5°F | Resp 16 | Ht 63.0 in | Wt 198.0 lb

## 2016-08-01 DIAGNOSIS — Z23 Encounter for immunization: Secondary | ICD-10-CM

## 2016-08-01 DIAGNOSIS — IMO0002 Reserved for concepts with insufficient information to code with codable children: Secondary | ICD-10-CM

## 2016-08-01 DIAGNOSIS — R0989 Other specified symptoms and signs involving the circulatory and respiratory systems: Secondary | ICD-10-CM | POA: Diagnosis not present

## 2016-08-01 DIAGNOSIS — E1365 Other specified diabetes mellitus with hyperglycemia: Secondary | ICD-10-CM | POA: Diagnosis not present

## 2016-08-01 LAB — GLUCOSE, FINGERSTICK (STAT): Glucose, fingerstick: 189 mg/dL — ABNORMAL HIGH (ref 65–99)

## 2016-08-01 NOTE — Progress Notes (Signed)
   Subjective:    Patient ID: Kristen [name], female    DOB: 1945/07/12, 71 y.o.   MRN: [id number]  Patient presents for Follow-up (is fasting) Patient here for interim follow-up on diabetes mellitus she was seen about a week and a half ago prior to that she had not been seen for a year. Her diabetes was uncontrolled her A1c resulted at 11.3%. She was prescribed NovoLog 8 units with each meal and Lantus ( she still has left over lantus, has not stated [place] yet )  65units with each meal Her triglycerides were elevated at 179 in the setting of her elevated blood sugars. She was continued on her same dose of Lipitor 40 mg as her LDL was at 78 Note she also had a fall which we obtain an x-ray of her knees this has not shown any problem with her previous surgery and hardware, just bruising  Request flu shot  States she is checking three times a day, date and time not set on meter This AM CBG 177, last night 208 This past weekend CBG 390/ 399 ate ice cream, fried chicken, maccaroni and cheese  No hypoglycemia sypptoms   Review Of Systems:  GEN- denies fatigue, fever, weight loss,weakness, recent illness HEENT- denies eye drainage, change in vision, nasal discharge, CVS- denies chest pain, palpitations RESP- denies SOB, cough, wheeze ABD- denies N/V, change in stools, abd pain GU- denies dysuria, hematuria, dribbling, incontinence MSK- + joint pain, muscle aches, injury Neuro- denies headache, dizziness, syncope, seizure activity       Objective:    BP 128/78   Pulse 64   Temp 98.5 F (36.9 C) (Oral)   Resp 16   Ht 5\' 3"  (1.6 m)   Wt 198 lb (89.8 kg)   SpO2 97%   BMI 35.07 kg/m  GEN- NAD, alert and oriented x3        Assessment & Plan:      Problem List Items Addressed This Visit    DM (diabetes mellitus), secondary uncontrolled ([place]) - Primary    Glucoeter checked against the one in our office and is correct. Date and time set on meter. Keep checking three times a day,  reiterated food choices Continue current regimen novlog 8 units with meals Lantus 65 units until she gets the tresiba  Plan to check labs again in 12 weeks  Goal fastins 120-150  , after meals less than 200  Flu shot given  Carotid duplex ordered       Relevant Orders   Glucose, fingerstick (stat)    Other Visit Diagnoses    Bruit of right carotid artery       Relevant Orders   US Carotid Duplex Bilateral   Need for prophylactic vaccination and inoculation against influenza       Relevant Orders   Flu Vaccine QUAD 36+ mos PF IM (Fluarix & Fluzone Quad PF) (Completed)      Note: This dictation was prepared with Dragon dictation along with smaller phrase technology. Any transcriptional errors that result from this process are unintentional.

## 2016-08-01 NOTE — Assessment & Plan Note (Signed)
Glucoeter checked against the one in our office and is correct. Date and time set on meter. Keep checking three times a day, reiterated food choices Continue current regimen novlog 8 units with meals Lantus 65 units until she gets the tresiba  Plan to check labs again in 12 weeks  Goal fastins 120-150  , after meals less than 200  Flu shot given  Carotid duplex ordered

## 2016-08-01 NOTE — Patient Instructions (Addendum)
We will call with ultrasound of neck  FLu shot given  Continue Lantus 65 units, novolog 8 units with each meal F/U 3 months

## 2016-08-23 ENCOUNTER — Ambulatory Visit
Admission: RE | Admit: 2016-08-23 | Discharge: 2016-08-23 | Disposition: A | Discharge status: Home / Self Care | Payer: Medicare Other | Source: Ambulatory Visit | Attending: Family Medicine | Admitting: Family Medicine

## 2016-08-23 DIAGNOSIS — R0989 Other specified symptoms and signs involving the circulatory and respiratory systems: Secondary | ICD-10-CM

## 2016-08-23 DIAGNOSIS — I6523 Occlusion and stenosis of bilateral carotid arteries: Secondary | ICD-10-CM | POA: Diagnosis not present

## 2016-09-01 ENCOUNTER — Encounter: Payer: Self-pay | Admitting: Family Medicine

## 2016-09-09 ENCOUNTER — Other Ambulatory Visit: Payer: Self-pay | Admitting: Family Medicine

## 2016-09-16 ENCOUNTER — Other Ambulatory Visit: Payer: Self-pay | Admitting: Family Medicine

## 2016-09-16 NOTE — Telephone Encounter (Signed)
Pen tips refilled

## 2016-10-10 ENCOUNTER — Encounter: Payer: Self-pay | Admitting: Family Medicine

## 2016-10-31 ENCOUNTER — Ambulatory Visit (INDEPENDENT_AMBULATORY_CARE_PROVIDER_SITE_OTHER): Payer: Medicare Other | Admitting: Family Medicine

## 2016-10-31 ENCOUNTER — Encounter: Payer: Self-pay | Admitting: Family Medicine

## 2016-10-31 VITALS — BP 130/78 | HR 82 | Temp 98.5°F | Resp 16 | Ht 63.0 in | Wt 197.0 lb

## 2016-10-31 DIAGNOSIS — E1365 Other specified diabetes mellitus with hyperglycemia: Secondary | ICD-10-CM

## 2016-10-31 DIAGNOSIS — I10 Essential (primary) hypertension: Secondary | ICD-10-CM

## 2016-10-31 DIAGNOSIS — IMO0002 Reserved for concepts with insufficient information to code with codable children: Secondary | ICD-10-CM

## 2016-10-31 DIAGNOSIS — E782 Mixed hyperlipidemia: Secondary | ICD-10-CM

## 2016-10-31 DIAGNOSIS — E1143 Type 2 diabetes mellitus with diabetic autonomic (poly)neuropathy: Secondary | ICD-10-CM

## 2016-10-31 DIAGNOSIS — E6609 Other obesity due to excess calories: Secondary | ICD-10-CM

## 2016-10-31 DIAGNOSIS — Z6834 Body mass index (BMI) 34.0-34.9, adult: Secondary | ICD-10-CM

## 2016-10-31 LAB — CBC WITH DIFFERENTIAL/PLATELET
BASOS ABS: 0 {cells}/uL (ref 0–200)
Basophils Relative: 0 %
EOS PCT: 3 %
Eosinophils Absolute: 210 cells/uL (ref 15–500)
HCT: 38.5 % (ref 35.0–45.0)
Hemoglobin: 12.1 g/dL (ref 12.0–15.0)
LYMPHS PCT: 24 %
Lymphs Abs: 1680 cells/uL (ref 850–3900)
MCH: 27.3 pg (ref 27.0–33.0)
MCHC: 31.4 g/dL — AB (ref 32.0–36.0)
MCV: 86.7 fL (ref 80.0–100.0)
MONOS PCT: 10 %
MPV: 9.5 fL (ref 7.5–12.5)
Monocytes Absolute: 700 cells/uL (ref 200–950)
NEUTROS PCT: 63 %
Neutro Abs: 4410 cells/uL (ref 1500–7800)
PLATELETS: 275 10*3/uL (ref 140–400)
RBC: 4.44 MIL/uL (ref 3.80–5.10)
RDW: 15.9 % — ABNORMAL HIGH (ref 11.0–15.0)
WBC: 7 10*3/uL (ref 3.8–10.8)

## 2016-10-31 NOTE — Assessment & Plan Note (Signed)
Continue statin and zetia She continues to sneak snacks/sweets

## 2016-10-31 NOTE — Assessment & Plan Note (Signed)
Goal at least less than 8% Discussed use of Tresiba, give each night, if she is dropping, which no sign of based on history and CBG decrease the short acting dinner insulin

## 2016-10-31 NOTE — Patient Instructions (Signed)
F/U 4 months Physical  

## 2016-10-31 NOTE — Progress Notes (Signed)
   Subjective:    Patient ID: Kristen [name], female    DOB: 10-07-1945, 71 y.o.   MRN: [id number]  Patient presents for 3 month F/U (is fasting)  Patient here to follow-up diabetes mellitus. Last seen 3 months ago that time her A1c was uncontrolled at 11.3% LDL is at goal at 78 however triglycerides were a little elevated at 179 in the setting of her uncontrolled diabetes. She is on Tresiba 65 units and NovoLog 8 units with each meal, metformin  CBGs have ranged past week AM 112-167, PM 134-197 ] On occasion her niece will have her hold the [place] if the evening dose is 140's ,though she is not symptomatic   She does continue to sneak snacks   HTN- taking Norvsac 2.5mg  daily  Hyperlipidemia Lipitor and Zetia    Review Of Systems:  GEN- denies fatigue, fever, weight loss,weakness, recent illness HEENT- denies eye drainage, change in vision, nasal discharge, CVS- denies chest pain, palpitations RESP- denies SOB, cough, wheeze ABD- denies N/V, change in stools, abd pain GU- denies dysuria, hematuria, dribbling, incontinence MSK- denies joint pain, muscle aches, injury Neuro- denies headache, dizziness, syncope, seizure activity       Objective:    BP 130/78   Pulse 82   Temp 98.5 F (36.9 C) (Oral)   Resp 16   Ht 5\' 3"  (1.6 m)   Wt 197 lb (89.4 kg)   SpO2 98%   BMI 34.90 kg/m  GEN- NAD, alert and oriented x3 HEENT- PERRL, EOMI, non injected sclera, pink conjunctiva, MMM, oropharynx clear Neck- Supple, no thyromegaly CVS- RRR, no murmur RESP-CTAB ABD-NABS,soft,NT,ND EXT- No edema Pulses- Radial, DP- 2+        Assessment & Plan:      Problem List Items Addressed This Visit    Obesity   Hyperlipidemia    Continue statin and zetia She continues to sneak snacks/sweets       Relevant Orders   Lipid panel   Essential hypertension - Primary    Controlled, in past had problems with potassium and renal function and ACE was stopped       Relevant Orders   Comprehensive metabolic panel   CBC with Differential/Platelet   DM (diabetes mellitus), secondary uncontrolled (HCC)    Goal at least less than 8% Discussed use of Tresiba, give each night, if she is dropping, which no sign of based on history and CBG decrease the short acting dinner insulin      Relevant Orders   Comprehensive metabolic panel   Hemoglobin A1c   Microalbumin / creatinine urine ratio   HM DIABETES FOOT EXAM (Completed)   Diabetic neuropathy ([place])      Note: This dictation was prepared with Dragon dictation along with smaller phrase technology. Any transcriptional errors that result from this process are unintentional.

## 2016-10-31 NOTE — Assessment & Plan Note (Signed)
Controlled, in past had problems with potassium and renal function and ACE was stopped

## 2016-11-01 LAB — COMPREHENSIVE METABOLIC PANEL
ALBUMIN: 4.2 g/dL (ref 3.6–5.1)
ALT: 20 U/L (ref 6–29)
AST: 13 U/L (ref 10–35)
Alkaline Phosphatase: 81 U/L (ref 33–130)
BILIRUBIN TOTAL: 0.4 mg/dL (ref 0.2–1.2)
BUN: 23 mg/dL (ref 7–25)
CALCIUM: 9.3 mg/dL (ref 8.6–10.4)
CO2: 25 mmol/L (ref 20–31)
Chloride: 105 mmol/L (ref 98–110)
Creat: 0.93 mg/dL (ref 0.60–0.93)
Glucose, Bld: 124 mg/dL — ABNORMAL HIGH (ref 70–99)
Potassium: 4.6 mmol/L (ref 3.5–5.3)
Sodium: 144 mmol/L (ref 135–146)
TOTAL PROTEIN: 6.9 g/dL (ref 6.1–8.1)

## 2016-11-01 LAB — LIPID PANEL
Cholesterol: 98 mg/dL (ref <200)
HDL: 31 mg/dL — ABNORMAL LOW (ref >50)
LDL Cholesterol: 44 mg/dL (ref <100)
Total CHOL/HDL Ratio: 3.2 Ratio (ref <5.0)
Triglycerides: 117 mg/dL (ref <150)
VLDL: 23 mg/dL (ref <30)

## 2016-11-01 LAB — MICROALBUMIN / CREATININE URINE RATIO
CREATININE, URINE: 103 mg/dL (ref 20–320)
MICROALB UR: 24.4 mg/dL
Microalb Creat Ratio: 237 mcg/mg creat — ABNORMAL HIGH (ref <30)

## 2016-11-01 LAB — HEMOGLOBIN A1C
HEMOGLOBIN A1C: 9.8 % — AB (ref <5.7)
Mean Plasma Glucose: 235 mg/dL

## 2016-12-12 ENCOUNTER — Inpatient Hospital Stay (HOSPITAL_COMMUNITY)
Admission: EM | Admit: 2016-12-12 | Discharge: 2016-12-14 | DRG: 312 | Disposition: A | Discharge status: Home / Self Care | Payer: Medicare Other | Attending: Family Medicine | Admitting: Family Medicine

## 2016-12-12 ENCOUNTER — Emergency Department (HOSPITAL_COMMUNITY): Payer: Medicare Other

## 2016-12-12 ENCOUNTER — Encounter (HOSPITAL_COMMUNITY): Payer: Self-pay | Admitting: Emergency Medicine

## 2016-12-12 DIAGNOSIS — Z96653 Presence of artificial knee joint, bilateral: Secondary | ICD-10-CM | POA: Diagnosis present

## 2016-12-12 DIAGNOSIS — E1149 Type 2 diabetes mellitus with other diabetic neurological complication: Secondary | ICD-10-CM | POA: Diagnosis present

## 2016-12-12 DIAGNOSIS — N390 Urinary tract infection, site not specified: Secondary | ICD-10-CM | POA: Diagnosis present

## 2016-12-12 DIAGNOSIS — R55 Syncope and collapse: Secondary | ICD-10-CM | POA: Diagnosis not present

## 2016-12-12 DIAGNOSIS — I10 Essential (primary) hypertension: Secondary | ICD-10-CM | POA: Diagnosis not present

## 2016-12-12 DIAGNOSIS — E1365 Other specified diabetes mellitus with hyperglycemia: Secondary | ICD-10-CM

## 2016-12-12 DIAGNOSIS — I5030 Unspecified diastolic (congestive) heart failure: Secondary | ICD-10-CM | POA: Diagnosis not present

## 2016-12-12 DIAGNOSIS — Z6836 Body mass index (BMI) 36.0-36.9, adult: Secondary | ICD-10-CM

## 2016-12-12 DIAGNOSIS — F341 Dysthymic disorder: Secondary | ICD-10-CM | POA: Diagnosis not present

## 2016-12-12 DIAGNOSIS — Z9010 Acquired absence of unspecified breast and nipple: Secondary | ICD-10-CM

## 2016-12-12 DIAGNOSIS — Z803 Family history of malignant neoplasm of breast: Secondary | ICD-10-CM

## 2016-12-12 DIAGNOSIS — Z882 Allergy status to sulfonamides status: Secondary | ICD-10-CM

## 2016-12-12 DIAGNOSIS — Z79899 Other long term (current) drug therapy: Secondary | ICD-10-CM

## 2016-12-12 DIAGNOSIS — F319 Bipolar disorder, unspecified: Secondary | ICD-10-CM | POA: Diagnosis present

## 2016-12-12 DIAGNOSIS — M79631 Pain in right forearm: Secondary | ICD-10-CM | POA: Diagnosis not present

## 2016-12-12 DIAGNOSIS — Z8249 Family history of ischemic heart disease and other diseases of the circulatory system: Secondary | ICD-10-CM

## 2016-12-12 DIAGNOSIS — Z853 Personal history of malignant neoplasm of breast: Secondary | ICD-10-CM

## 2016-12-12 DIAGNOSIS — S59911A Unspecified injury of right forearm, initial encounter: Secondary | ICD-10-CM | POA: Diagnosis not present

## 2016-12-12 DIAGNOSIS — Z23 Encounter for immunization: Secondary | ICD-10-CM

## 2016-12-12 DIAGNOSIS — M25512 Pain in left shoulder: Secondary | ICD-10-CM | POA: Diagnosis not present

## 2016-12-12 DIAGNOSIS — S0990XA Unspecified injury of head, initial encounter: Secondary | ICD-10-CM | POA: Diagnosis not present

## 2016-12-12 DIAGNOSIS — N189 Chronic kidney disease, unspecified: Secondary | ICD-10-CM | POA: Diagnosis not present

## 2016-12-12 DIAGNOSIS — W19XXXA Unspecified fall, initial encounter: Secondary | ICD-10-CM | POA: Diagnosis present

## 2016-12-12 DIAGNOSIS — E1121 Type 2 diabetes mellitus with diabetic nephropathy: Secondary | ICD-10-CM | POA: Diagnosis present

## 2016-12-12 DIAGNOSIS — Y92000 Kitchen of unspecified non-institutional (private) residence as  the place of occurrence of the external cause: Secondary | ICD-10-CM

## 2016-12-12 DIAGNOSIS — E785 Hyperlipidemia, unspecified: Secondary | ICD-10-CM | POA: Diagnosis present

## 2016-12-12 DIAGNOSIS — Z96642 Presence of left artificial hip joint: Secondary | ICD-10-CM | POA: Diagnosis present

## 2016-12-12 DIAGNOSIS — Z791 Long term (current) use of non-steroidal anti-inflammatories (NSAID): Secondary | ICD-10-CM

## 2016-12-12 DIAGNOSIS — E1122 Type 2 diabetes mellitus with diabetic chronic kidney disease: Secondary | ICD-10-CM | POA: Diagnosis present

## 2016-12-12 DIAGNOSIS — S069X9A Unspecified intracranial injury with loss of consciousness of unspecified duration, initial encounter: Secondary | ICD-10-CM | POA: Diagnosis not present

## 2016-12-12 DIAGNOSIS — R42 Dizziness and giddiness: Secondary | ICD-10-CM | POA: Diagnosis not present

## 2016-12-12 DIAGNOSIS — I2583 Coronary atherosclerosis due to lipid rich plaque: Secondary | ICD-10-CM | POA: Diagnosis not present

## 2016-12-12 DIAGNOSIS — I2510 Atherosclerotic heart disease of native coronary artery without angina pectoris: Secondary | ICD-10-CM | POA: Diagnosis present

## 2016-12-12 DIAGNOSIS — I130 Hypertensive heart and chronic kidney disease with heart failure and stage 1 through stage 4 chronic kidney disease, or unspecified chronic kidney disease: Secondary | ICD-10-CM | POA: Diagnosis present

## 2016-12-12 DIAGNOSIS — Z794 Long term (current) use of insulin: Secondary | ICD-10-CM

## 2016-12-12 DIAGNOSIS — E1140 Type 2 diabetes mellitus with diabetic neuropathy, unspecified: Secondary | ICD-10-CM | POA: Diagnosis present

## 2016-12-12 DIAGNOSIS — S79912A Unspecified injury of left hip, initial encounter: Secondary | ICD-10-CM | POA: Diagnosis not present

## 2016-12-12 DIAGNOSIS — M25552 Pain in left hip: Secondary | ICD-10-CM | POA: Diagnosis not present

## 2016-12-12 DIAGNOSIS — E860 Dehydration: Secondary | ICD-10-CM | POA: Diagnosis not present

## 2016-12-12 DIAGNOSIS — S199XXA Unspecified injury of neck, initial encounter: Secondary | ICD-10-CM | POA: Diagnosis not present

## 2016-12-12 DIAGNOSIS — S0181XA Laceration without foreign body of other part of head, initial encounter: Secondary | ICD-10-CM | POA: Diagnosis not present

## 2016-12-12 DIAGNOSIS — I4510 Unspecified right bundle-branch block: Secondary | ICD-10-CM | POA: Diagnosis present

## 2016-12-12 DIAGNOSIS — S4992XA Unspecified injury of left shoulder and upper arm, initial encounter: Secondary | ICD-10-CM | POA: Diagnosis not present

## 2016-12-12 DIAGNOSIS — F419 Anxiety disorder, unspecified: Secondary | ICD-10-CM | POA: Diagnosis present

## 2016-12-12 DIAGNOSIS — K219 Gastro-esophageal reflux disease without esophagitis: Secondary | ICD-10-CM | POA: Diagnosis present

## 2016-12-12 LAB — URINALYSIS, ROUTINE W REFLEX MICROSCOPIC
BILIRUBIN URINE: NEGATIVE
GLUCOSE, UA: 150 mg/dL — AB
KETONES UR: 5 mg/dL — AB
Nitrite: NEGATIVE
PH: 6 (ref 5.0–8.0)
Protein, ur: 30 mg/dL — AB
Specific Gravity, Urine: 1.01 (ref 1.005–1.030)

## 2016-12-12 LAB — COMPREHENSIVE METABOLIC PANEL
ALBUMIN: 3.7 g/dL (ref 3.5–5.0)
ALT: 24 U/L (ref 14–54)
AST: 20 U/L (ref 15–41)
Alkaline Phosphatase: 96 U/L (ref 38–126)
Anion gap: 14 (ref 5–15)
BILIRUBIN TOTAL: 0.6 mg/dL (ref 0.3–1.2)
BUN: 17 mg/dL (ref 6–20)
CO2: 24 mmol/L (ref 22–32)
Calcium: 9.4 mg/dL (ref 8.9–10.3)
Chloride: 102 mmol/L (ref 101–111)
Creatinine, Ser: 1.01 mg/dL — ABNORMAL HIGH (ref 0.44–1.00)
GFR calc Af Amer: >60 mL/min (ref >60)
GFR, EST NON AFRICAN AMERICAN: 55 mL/min — AB (ref >60)
GLUCOSE: 234 mg/dL — AB (ref 65–99)
POTASSIUM: 4.7 mmol/L (ref 3.5–5.1)
Sodium: 140 mmol/L (ref 135–145)
TOTAL PROTEIN: 7.2 g/dL (ref 6.5–8.1)

## 2016-12-12 LAB — CBC WITH DIFFERENTIAL/PLATELET
BASOS PCT: 0 %
Basophils Absolute: 0 10*3/uL (ref 0.0–0.1)
EOS PCT: 4 %
Eosinophils Absolute: 0.4 10*3/uL (ref 0.0–0.7)
HEMATOCRIT: 37.9 % (ref 36.0–46.0)
Hemoglobin: 12 g/dL (ref 12.0–15.0)
Lymphocytes Relative: 26 %
Lymphs Abs: 2.3 10*3/uL (ref 0.7–4.0)
MCH: 27.3 pg (ref 26.0–34.0)
MCHC: 31.7 g/dL (ref 30.0–36.0)
MCV: 86.1 fL (ref 78.0–100.0)
MONO ABS: 0.7 10*3/uL (ref 0.1–1.0)
MONOS PCT: 8 %
NEUTROS ABS: 5.7 10*3/uL (ref 1.7–7.7)
Neutrophils Relative %: 62 %
PLATELETS: 262 10*3/uL (ref 150–400)
RBC: 4.4 MIL/uL (ref 3.87–5.11)
RDW: 14.5 % (ref 11.5–15.5)
WBC: 9.1 10*3/uL (ref 4.0–10.5)

## 2016-12-12 LAB — I-STAT TROPONIN, ED: Troponin i, poc: 0 ng/mL (ref 0.00–0.08)

## 2016-12-12 MED ORDER — GABAPENTIN 300 MG PO CAPS
300.0000 mg | ORAL_CAPSULE | Freq: Three times a day (TID) | ORAL | Status: DC
  Administered 2016-12-13 – 2016-12-14 (×5): 300 mg via ORAL
  Filled 2016-12-12 (×5): qty 1

## 2016-12-12 MED ORDER — AMLODIPINE BESYLATE 5 MG PO TABS: 2.5000 mg | ORAL_TABLET | Freq: Every day | ORAL | Status: DC

## 2016-12-12 MED ORDER — ASPIRIN EC 81 MG PO TBEC
81.0000 mg | DELAYED_RELEASE_TABLET | Freq: Every day | ORAL | Status: DC
  Administered 2016-12-13 – 2016-12-14 (×2): 81 mg via ORAL
  Filled 2016-12-12 (×2): qty 1

## 2016-12-12 MED ORDER — INSULIN ASPART 100 UNIT/ML ~~LOC~~ SOLN
0.0000 [IU] | Freq: Every day | SUBCUTANEOUS | Status: DC
  Administered 2016-12-13: 4 [IU] via SUBCUTANEOUS

## 2016-12-12 MED ORDER — DEXTROSE 5 % IV SOLN
1.0000 g | Freq: Once | INTRAVENOUS | Status: AC
  Administered 2016-12-12: 1 g via INTRAVENOUS
  Filled 2016-12-12: qty 10

## 2016-12-12 MED ORDER — ENOXAPARIN SODIUM 40 MG/0.4ML ~~LOC~~ SOLN
40.0000 mg | Freq: Every day | SUBCUTANEOUS | Status: DC
  Administered 2016-12-13 (×2): 40 mg via SUBCUTANEOUS
  Filled 2016-12-12 (×2): qty 0.4

## 2016-12-12 MED ORDER — EZETIMIBE 10 MG PO TABS
10.0000 mg | ORAL_TABLET | Freq: Every day | ORAL | Status: DC
  Administered 2016-12-13 – 2016-12-14 (×2): 10 mg via ORAL
  Filled 2016-12-12 (×2): qty 1

## 2016-12-12 MED ORDER — ONDANSETRON HCL 4 MG/2ML IJ SOLN
4.0000 mg | Freq: Once | INTRAMUSCULAR | Status: AC
Start: 2016-12-12 — End: 2016-12-12
  Administered 2016-12-12: 4 mg via INTRAVENOUS
  Filled 2016-12-12: qty 2

## 2016-12-12 MED ORDER — ACETAMINOPHEN 325 MG PO TABS
650.0000 mg | ORAL_TABLET | Freq: Four times a day (QID) | ORAL | Status: DC | PRN
  Administered 2016-12-13 (×2): 650 mg via ORAL
  Filled 2016-12-12 (×2): qty 2

## 2016-12-12 MED ORDER — OXYBUTYNIN CHLORIDE 5 MG PO TABS
5.0000 mg | ORAL_TABLET | Freq: Two times a day (BID) | ORAL | Status: DC
  Administered 2016-12-13: 5 mg via ORAL
  Filled 2016-12-12: qty 1

## 2016-12-12 MED ORDER — BISACODYL 5 MG PO TBEC: 5.0000 mg | DELAYED_RELEASE_TABLET | Freq: Every day | ORAL | Status: DC | PRN

## 2016-12-12 MED ORDER — PANTOPRAZOLE SODIUM 40 MG PO TBEC
40.0000 mg | DELAYED_RELEASE_TABLET | Freq: Two times a day (BID) | ORAL | Status: DC
  Administered 2016-12-13 – 2016-12-14 (×3): 40 mg via ORAL
  Filled 2016-12-12 (×3): qty 1

## 2016-12-12 MED ORDER — FLUOXETINE HCL 20 MG PO CAPS
40.0000 mg | ORAL_CAPSULE | Freq: Every day | ORAL | Status: DC
  Administered 2016-12-13 – 2016-12-14 (×2): 40 mg via ORAL
  Filled 2016-12-12 (×2): qty 2

## 2016-12-12 MED ORDER — INSULIN ASPART 100 UNIT/ML ~~LOC~~ SOLN
0.0000 [IU] | Freq: Three times a day (TID) | SUBCUTANEOUS | Status: DC
  Administered 2016-12-13: 3 [IU] via SUBCUTANEOUS
  Administered 2016-12-13 (×2): 11 [IU] via SUBCUTANEOUS
  Administered 2016-12-14: 15 [IU] via SUBCUTANEOUS
  Administered 2016-12-14: 8 [IU] via SUBCUTANEOUS

## 2016-12-12 MED ORDER — MORPHINE SULFATE (PF) 2 MG/ML IV SOLN
4.0000 mg | Freq: Once | INTRAVENOUS | Status: AC
  Administered 2016-12-12: 4 mg via INTRAVENOUS
  Filled 2016-12-12: qty 2

## 2016-12-12 MED ORDER — SODIUM CHLORIDE 0.9 % IV SOLN
INTRAVENOUS | Status: DC
  Administered 2016-12-13: 09:00:00 via INTRAVENOUS

## 2016-12-12 MED ORDER — EXEMESTANE 25 MG PO TABS
25.0000 mg | ORAL_TABLET | Freq: Every day | ORAL | Status: DC
  Administered 2016-12-13 – 2016-12-14 (×2): 25 mg via ORAL
  Filled 2016-12-12 (×2): qty 1

## 2016-12-12 MED ORDER — ONDANSETRON HCL 4 MG PO TABS: 4.0000 mg | ORAL_TABLET | Freq: Four times a day (QID) | ORAL | Status: DC | PRN

## 2016-12-12 MED ORDER — SODIUM CHLORIDE 0.9 % IV BOLUS (SEPSIS)
1000.0000 mL | Freq: Once | INTRAVENOUS | Status: AC
  Administered 2016-12-12: 1000 mL via INTRAVENOUS

## 2016-12-12 MED ORDER — INSULIN GLARGINE 100 UNIT/ML ~~LOC~~ SOLN
60.0000 [IU] | Freq: Every day | SUBCUTANEOUS | Status: DC
  Administered 2016-12-13 (×2): 60 [IU] via SUBCUTANEOUS
  Filled 2016-12-12 (×3): qty 0.6

## 2016-12-12 MED ORDER — ATORVASTATIN CALCIUM 40 MG PO TABS
40.0000 mg | ORAL_TABLET | Freq: Every day | ORAL | Status: DC
  Administered 2016-12-13: 40 mg via ORAL
  Filled 2016-12-12: qty 1

## 2016-12-12 MED ORDER — MORPHINE SULFATE (PF) 2 MG/ML IV SOLN: 2.0000 mg | INTRAVENOUS | Status: DC | PRN

## 2016-12-12 MED ORDER — INSULIN ASPART 100 UNIT/ML ~~LOC~~ SOLN
4.0000 [IU] | Freq: Three times a day (TID) | SUBCUTANEOUS | Status: DC
  Administered 2016-12-13 – 2016-12-14 (×5): 4 [IU] via SUBCUTANEOUS

## 2016-12-12 MED ORDER — CEFTRIAXONE SODIUM 1 G IJ SOLR
1.0000 g | INTRAMUSCULAR | Status: DC
  Administered 2016-12-13: 1 g via INTRAVENOUS
  Filled 2016-12-12 (×2): qty 10

## 2016-12-12 MED ORDER — ONDANSETRON HCL 4 MG/2ML IJ SOLN: 4.0000 mg | Freq: Four times a day (QID) | INTRAMUSCULAR | Status: DC | PRN

## 2016-12-12 MED ORDER — ACETAMINOPHEN 650 MG RE SUPP: 650.0000 mg | Freq: Four times a day (QID) | RECTAL | Status: DC | PRN

## 2016-12-12 MED ORDER — HYDROCODONE-ACETAMINOPHEN 5-325 MG PO TABS
1.0000 | ORAL_TABLET | ORAL | Status: DC | PRN
  Administered 2016-12-13 – 2016-12-14 (×2): 1 via ORAL
  Filled 2016-12-12 (×2): qty 1

## 2016-12-12 MED ORDER — SODIUM CHLORIDE 0.9% FLUSH
3.0000 mL | Freq: Two times a day (BID) | INTRAVENOUS | Status: DC
  Administered 2016-12-13 – 2016-12-14 (×3): 3 mL via INTRAVENOUS

## 2016-12-12 MED ORDER — POLYETHYLENE GLYCOL 3350 17 G PO PACK: 17.0000 g | PACK | Freq: Every day | ORAL | Status: DC | PRN

## 2016-12-12 NOTE — ED Provider Notes (Signed)
[facility] DEPT Provider Note   CSN: [id number] Arrival date & time: 12/12/16  1850     History   Chief Complaint Chief Complaint  Patient presents with  . Loss of Consciousness  . Fall    HPI Kristen [name] is a 71 y.o. female.  HPI   71 year old female with past medical history as below including coronary artery disease, diastolic CHF, chronic kidney disease, who presents with loss of consciousness. Patient states that she was standing in her house earlier today when she felt a tingling sensation in her head. She then immediately lost consciousness and fell to the ground. She does not remember anything but does remember waking up on the ground. She denies any associated chest pain or palpitations. No shortness of breath. She does endorse mild fatigue over the last several days and some urinary frequency. No flank pain. No fevers or chills. No vomiting. She had just recently changed positions, but is adamant that she had been standing for some time without any symptoms. Denies any recent medication changes.  Patient currently endorses mild headache, neck pain, right-sided chest pain, and left hip pain. This all began after fall.  Past Medical History:  Diagnosis Date  . Allergy   . Anxiety   . Arthritis   . Blood transfusion without reported diagnosis   . Cancer ([place])    breast-left   . Chronic kidney disease    hx of kidney stones  . Coronary artery disease, non-occlusive    cath 2010 by Dr Lia Foyer revealed scattered nonobstrructive disease with preserved EF  . Depression   . Diabetes mellitus   . GERD (gastroesophageal reflux disease)   . Hemorrhoids   . Hyperlipidemia   . Hypertension   . Obesity   . Overactive bladder   . Vitamin D deficiency     Patient Active Problem List   Diagnosis Date Noted  . Acute lower UTI 12/12/2016  . Personal history of colonic polyps 02/12/2016  . Hyperkalemia 02/11/2015  . Nasal bone fractures 02/11/2015  . DM (diabetes  mellitus), secondary uncontrolled ([place]) 02/11/2015  . Syncope 02/11/2015  . AK (actinic keratosis) 10/21/2013  . Diabetic neuropathy ([place]) 07/24/2013  . Malignant neoplasm of upper-outer quadrant of left female breast ([place]) 11/30/2012  . Vitamin D deficiency 08/24/2012  . RBBB 02/20/2009  . Obesity 02/15/2009  . ANEMIA 02/15/2009  . DEPRESSION/ANXIETY 02/15/2009  . Essential hypertension 02/15/2009  . CAD (coronary artery disease) 02/15/2009  . GERD 02/15/2009  . Osteoarthritis 02/15/2009  . Hyperlipidemia 02/22/2007    Past Surgical History:  Procedure Laterality Date  . APPENDECTOMY    . BACK SURGERY    . BREAST SURGERY    . CARDIAC CATHETERIZATION  01/2009   cath by Dr Lia Foyer revealed nonobstructive CAD  . CESAREAN SECTION    . CHOLECYSTECTOMY    . CHOLECYSTECTOMY, LAPAROSCOPIC    . COLONOSCOPY     last 2012- with polyps  . JOINT REPLACEMENT    . LITHOTRIPSY    . MASTECTOMY W/ SENTINEL NODE BIOPSY Left 12/24/2012   Procedure: LEFT MASTECTOMY WITH LEFT SENTINEL LYMPH NODE BIOPSY;  Surgeon: Rolm Bookbinder, MD;  Location: [facility];  Service: General;  Laterality: Left;  . OOPHORECTOMY Right    1.5  ovaries rem  . PARTIAL HIP ARTHROPLASTY     left hip  . POLYPECTOMY    . REPLACEMENT TOTAL KNEE     both knees  . TONSILLECTOMY    . TOTAL MASTECTOMY Left 12/24/2012   Dr  [place]    OB History    No data available       Home Medications    Prior to Admission medications   Medication Sig Start Date End Date Taking? Authorizing Provider  amLODipine (NORVASC) 2.5 MG tablet Take 1 tablet (2.5 mg total) by mouth daily. 07/22/16  Yes [place], Modena Nunnery, MD  aspirin EC 81 MG tablet Take 81 mg by mouth daily.   Yes [provider]  atorvastatin (LIPITOR) 40 MG tablet TAKE 1 TABLET BY MOUTH DAILY AT 6 PM 07/22/16  Yes [place], Modena Nunnery, MD  exemestane (AROMASIN) 25 MG tablet Take 1 tablet (25 mg total) by mouth daily after breakfast. 03/25/16  Yes Nicholas Lose, MD    ezetimibe (ZETIA) 10 MG tablet Take 1 tablet (10 mg total) by mouth daily. 07/22/16  Yes [place], Modena Nunnery, MD  FLUoxetine (PROZAC) 40 MG capsule Take 1 capsule (40 mg total) by mouth daily. 07/22/16  Yes [place], Modena Nunnery, MD  gabapentin (NEURONTIN) 300 MG capsule Take 1 capsule (300 mg total) by mouth 3 (three) times daily. 07/22/16  Yes [place], Modena Nunnery, MD  ibuprofen (ADVIL,MOTRIN) 200 MG tablet Take 200 mg by mouth every 6 (six) hours as needed for pain.   Yes [provider]  insulin aspart (NOVOLOG FLEXPEN) 100 UNIT/ML FlexPen Inject 5- 50 units TID as directed with meals. Patient taking differently: Inject 8 Units into the skin 3 (three) times daily with meals.  07/22/16  Yes [place], Modena Nunnery, MD  insulin degludec (TRESIBA FLEXTOUCH) 100 UNIT/ML SOPN FlexTouch Pen Inject 0.6 mLs (60 Units total) into the skin daily at 10 pm. 07/22/16  Yes [place], Modena Nunnery, MD  metFORMIN (GLUCOPHAGE) 1000 MG tablet TAKE 1 TABLET BY MOUTH 2 TIMES DAILY WITH A MEAL. 07/22/16  Yes [place], Modena Nunnery, MD  omeprazole (PRILOSEC) 20 MG capsule TAKE 1 CAPSULE BY MOUTH 2 TIMES DAILY. 07/22/16  Yes [place], Modena Nunnery, MD  oxybutynin (DITROPAN) 5 MG tablet Take 1 tablet (5 mg total) by mouth 2 (two) times daily. 07/22/16  Yes [place], Modena Nunnery, MD  blood glucose meter kit and supplies KIT Dispense based on patient and insurance preference. Use to monitor FSBS 3x daily. Dx: E11.41. 12/29/14   Alycia Rossetti, MD  glucose blood test strip Dispense based on patient and insurance preference. Use to monitor FSBS 3x daily. Dx: E11.41. 12/29/14   Alycia Rossetti, MD  Lancets MISC Dispense based on patient and insurance preference. Use to monitor FSBS 3x daily. Dx: P29.51. 12/29/14   Alycia Rossetti, MD  UNIFINE PENTIPS 31G X 6 MM MISC USE WITH INSULIN FOUR TIMES DAILY 09/16/16   Alycia Rossetti, MD    Family History Family History  Problem Relation Age of Onset  . Cancer Mother        mouth cancer  . Breast cancer Mother         possibly dx in her 13s  . Lung cancer Brother 64  . Lung cancer Brother   . Lung cancer Brother   . Heart attack Father 84  . Breast cancer Sister   . Brain cancer Brother   . Colon cancer Neg Hx   . Colon polyps Neg Hx   . Rectal cancer Neg Hx   . Stomach cancer Neg Hx   . Esophageal cancer Neg Hx     Social History Social History  Substance Use Topics  . Smoking status: Never Smoker  . Smokeless tobacco: Never Used  .  Alcohol use No     Allergies   Sulfonamide derivatives   Review of Systems Review of Systems  Constitutional: Positive for fatigue.  Genitourinary: Positive for frequency.  Musculoskeletal: Positive for arthralgias.  Neurological: Positive for syncope and weakness.  All other systems reviewed and are negative.    Physical Exam Updated Vital Signs BP 113/66   Pulse 67   Temp 98.2 F (36.8 C) (Oral)   Resp 18   Ht _0  (1.575 m)   Wt 89.6 kg (197 lb 8.5 oz)   SpO2 100%   BMI 36.13 kg/m   Physical Exam  Constitutional: She is oriented to person, place, and time. She appears well-developed and well-nourished. No distress.  HENT:  Head: Normocephalic and atraumatic.  Eyes: Conjunctivae are normal.  Neck: Neck supple.  Mild, midline upper cervical TTP. No bruising or deformities.  Cardiovascular: Normal rate, regular rhythm and normal heart sounds.  Exam reveals no friction rub.   No murmur heard. Pulmonary/Chest: Effort normal and breath sounds normal. No respiratory distress. She has no wheezes. She has no rales. She exhibits tenderness (Tenderness to palpation over right anterior chest).  Abdominal: Soft. She exhibits no distension. There is tenderness (suprapubic).  Musculoskeletal: She exhibits no edema.  Mild TTP over left hip, no deformity, minimal pain with pROM.  Neurological: She is alert and oriented to person, place, and time. She exhibits normal muscle tone.  Skin: Skin is warm. Capillary refill takes less than 2 seconds.   Psychiatric: She has a normal mood and affect.  Nursing note and vitals reviewed.    ED Treatments / Results  Labs (all labs ordered are listed, but only abnormal results are displayed) Labs Reviewed  COMPREHENSIVE METABOLIC PANEL - Abnormal; Notable for the following:       Result Value   Glucose, Bld 234 (*)    Creatinine, Ser 1.01 (*)    GFR calc non Af Amer 55 (*)    All other components within normal limits  URINALYSIS, ROUTINE W REFLEX MICROSCOPIC - Abnormal; Notable for the following:    Glucose, UA 150 (*)    Hgb urine dipstick SMALL (*)    Ketones, ur 5 (*)    Protein, ur 30 (*)    Leukocytes, UA LARGE (*)    Bacteria, UA RARE (*)    Squamous Epithelial / LPF 0-5 (*)    All other components within normal limits  GLUCOSE, CAPILLARY - Abnormal; Notable for the following:    Glucose-Capillary 145 (*)    All other components within normal limits  URINE CULTURE  CBC WITH DIFFERENTIAL/PLATELET  BASIC METABOLIC PANEL  CBC  I-STAT TROPOININ, ED    EKG  EKG Interpretation  Date/Time:  Monday December 12 2016 20:51:04 EDT Ventricular Rate:  77 PR Interval:    QRS Duration: 146 QT Interval:  410 QTC Calculation: 464 R Axis:   -55 Text Interpretation:  Sinus rhythm Prolonged PR interval RBBB and LAFB Left ventricular hypertrophy Since last EKG, there is progression of LVH and RBBB Otherwise no significant change Confirmed by Duffy Bruce [phone]) on 12/13/2016 2:16:45 AM       Radiology Dg Chest 2 View  Result Date: 12/12/2016 CLINICAL DATA:  Loss of consciousness. Dizziness. Left breast cancer with mastectomy 5 years ago. EXAM: CHEST  2 VIEW COMPARISON:  02/11/2015 CXR FINDINGS: The heart size and mediastinal contours are within normal limits. Chronic stable mild elevation of right hemidiaphragm. Left axillary clips are present as before. Both lungs  are clear. Chronic degenerative changes are seen along the dorsal spine without acute compression, lytic or blastic  disease. IMPRESSION: No active cardiopulmonary disease. Electronically Signed   By: Ashley Royalty M.D.   On: 12/12/2016 21:06   Dg Forearm Right  Result Date: 12/12/2016 CLINICAL DATA:  Pain after fall.  Syncopal episode today. EXAM: RIGHT FOREARM - 2 VIEW COMPARISON:  None. FINDINGS: There is no evidence of fracture or other focal bone lesions. Intra-articular calcifications noted of the wrist and elbow which may reflect chondrocalcinosis. Changes of medial epicondylitis noted with soft tissue calcifications adjacent to the medial humeral condyle appear. IMPRESSION: 1. Negative for acute fracture or dislocation. 2. Chondrocalcinosis of the wrist and elbow joints with changes of medial epicondylitis. Electronically Signed   By: Ashley Royalty M.D.   On: 12/12/2016 21:09   Ct Head Wo Contrast  Result Date: 12/12/2016 CLINICAL DATA:  Fall in the kitchen after dizziness striking head on floor. Positive loss of consciousness. EXAM: CT HEAD WITHOUT CONTRAST CT CERVICAL SPINE WITHOUT CONTRAST TECHNIQUE: Multidetector CT imaging of the head and cervical spine was performed following the standard protocol without intravenous contrast. Multiplanar CT image reconstructions of the cervical spine were also generated. COMPARISON:  Head and face CT 02/10/2015 FINDINGS: CT HEAD FINDINGS Brain: No evidence of acute infarction, hemorrhage, hydrocephalus, extra-axial collection or mass lesion/mass effect. Vascular: No hyperdense vessel or unexpected calcification. Skull: No fracture or focal lesion. Hyperostosis frontalis in incidentally noted and unchanged. Sinuses/Orbits: Paranasal sinuses and mastoid air cells are clear. The visualized orbits are unremarkable. Other: None. CT CERVICAL SPINE FINDINGS Alignment: Normal. Skull base and vertebrae: No acute fracture. Vertebral body heights are maintained. The dens and skull base are intact. Soft tissues and spinal canal: No prevertebral fluid or swelling. No visible canal hematoma.  Disc levels: Disc space narrowing from C4-C5 through C6-C7 with endplate spurring. Multilevel facet arthropathy. Upper chest: No acute abnormality. Other: None. IMPRESSION: 1.  No acute intracranial abnormality.  No skull fracture. 2. No fracture or subluxation of the cervical spine. Degenerative disc disease and facet arthropathy. Electronically Signed   By: Jeb Levering M.D.   On: 12/12/2016 20:43   Ct Cervical Spine Wo Contrast  Result Date: 12/12/2016 CLINICAL DATA:  Fall in the kitchen after dizziness striking head on floor. Positive loss of consciousness. EXAM: CT HEAD WITHOUT CONTRAST CT CERVICAL SPINE WITHOUT CONTRAST TECHNIQUE: Multidetector CT imaging of the head and cervical spine was performed following the standard protocol without intravenous contrast. Multiplanar CT image reconstructions of the cervical spine were also generated. COMPARISON:  Head and face CT 02/10/2015 FINDINGS: CT HEAD FINDINGS Brain: No evidence of acute infarction, hemorrhage, hydrocephalus, extra-axial collection or mass lesion/mass effect. Vascular: No hyperdense vessel or unexpected calcification. Skull: No fracture or focal lesion. Hyperostosis frontalis in incidentally noted and unchanged. Sinuses/Orbits: Paranasal sinuses and mastoid air cells are clear. The visualized orbits are unremarkable. Other: None. CT CERVICAL SPINE FINDINGS Alignment: Normal. Skull base and vertebrae: No acute fracture. Vertebral body heights are maintained. The dens and skull base are intact. Soft tissues and spinal canal: No prevertebral fluid or swelling. No visible canal hematoma. Disc levels: Disc space narrowing from C4-C5 through C6-C7 with endplate spurring. Multilevel facet arthropathy. Upper chest: No acute abnormality. Other: None. IMPRESSION: 1.  No acute intracranial abnormality.  No skull fracture. 2. No fracture or subluxation of the cervical spine. Degenerative disc disease and facet arthropathy. Electronically Signed   By:  Jeb Levering M.D.   On:  12/12/2016 20:43   Dg Humerus Left  Result Date: 12/12/2016 CLINICAL DATA:  Proximal left humeral pain after fall. EXAM: LEFT HUMERUS - 2+ VIEW COMPARISON:  None. FINDINGS: Soft tissue calcification adjacent to the humeral head consistent with rotator cuff tendinopathy. No fracture of the humerus nor bone destruction. No joint dislocations. Osteoarthritis of the [facility] joint with spurring. Small soft tissue calcification is seen adjacent to the acromion. IMPRESSION: 1. No acute fracture-dislocation of the left humerus. 2. AC joint osteoarthritis. 3. Soft tissue calcification adjacent to the humeral head may reflect calcific rotator cuff tendinopathy. Electronically Signed   By: Ashley Royalty M.D.   On: 12/12/2016 21:11   Dg Hip Unilat With Pelvis Min 4 Views Left  Result Date: 12/12/2016 CLINICAL DATA:  Left hip pain after fall. EXAM: DG HIP (WITH OR WITHOUT PELVIS) 4+V LEFT COMPARISON:  None. FINDINGS: There is no evidence of hip fracture or dislocation. Laminectomy defects at L2 through L5 with disc space narrowing and left lateral osteophyte formation at L4-5. Pedicle screws noted bilaterally at L3 and L4. Left hip arthroplasty with uncemented femoral stem is noted. No fracture is identified of the included lumbar spine, pelvis and both hips. There is no evidence of arthropathy or other focal bone abnormality. IMPRESSION: 1. Negative for acute fracture nor dislocations of either hip. 2. Lower lumbar degenerative disc disease L4-5. 3. Laminectomy defects of the included lumbar spine from L2 caudad through L5. Electronically Signed   By: Ashley Royalty M.D.   On: 12/12/2016 21:15    Procedures Procedures (including critical care time)  Medications Ordered in ED Medications  amLODipine (NORVASC) tablet 2.5 mg (not administered)  atorvastatin (LIPITOR) tablet 40 mg (not administered)  ezetimibe (ZETIA) tablet 10 mg (not administered)  FLUoxetine (PROZAC) capsule 40 mg (not  administered)  gabapentin (NEURONTIN) capsule 300 mg (300 mg Oral Given 12/13/16 0113)  pantoprazole (PROTONIX) EC tablet 40 mg (not administered)  oxybutynin (DITROPAN) tablet 5 mg (not administered)  exemestane (AROMASIN) tablet 25 mg (not administered)  aspirin EC tablet 81 mg (not administered)  sodium chloride flush (NS) 0.9 % injection 3 mL (3 mLs Intravenous Not Given 12/13/16 0057)  insulin glargine (LANTUS) injection 60 Units (60 Units Subcutaneous Given 12/13/16 0130)  insulin aspart (novoLOG) injection 0-15 Units (not administered)  insulin aspart (novoLOG) injection 0-5 Units (0 Units Subcutaneous Not Given 12/13/16 0056)  insulin aspart (novoLOG) injection 4 Units (not administered)  enoxaparin (LOVENOX) injection 40 mg (40 mg Subcutaneous Given 12/13/16 0112)  0.9 %  sodium chloride infusion ( Intravenous Rate/Dose Change 12/13/16 0112)  acetaminophen (TYLENOL) tablet 650 mg (not administered)    Or  acetaminophen (TYLENOL) suppository 650 mg (not administered)  HYDROcodone-acetaminophen (NORCO/VICODIN) 5-325 MG per tablet 1-2 tablet (1 tablet Oral Given 12/13/16 0113)  polyethylene glycol (MIRALAX / GLYCOLAX) packet 17 g (not administered)  bisacodyl (DULCOLAX) EC tablet 5 mg (not administered)  ondansetron (ZOFRAN) tablet 4 mg (not administered)    Or  ondansetron (ZOFRAN) injection 4 mg (not administered)  morphine 2 MG/ML injection 2 mg (not administered)  cefTRIAXone (ROCEPHIN) 1 g in dextrose 5 % 50 mL IVPB (not administered)  pneumococcal 23 valent vaccine (PNU-IMMUNE) injection 0.5 mL (not administered)  morphine 2 MG/ML injection 4 mg (4 mg Intravenous Given 12/12/16 2243)  ondansetron (ZOFRAN) injection 4 mg (4 mg Intravenous Given 12/12/16 2243)  cefTRIAXone (ROCEPHIN) 1 g in dextrose 5 % 50 mL IVPB (0 g Intravenous Stopped 12/12/16 2323)  sodium chloride 0.9 % bolus 1,000  mL (0 mLs Intravenous Stopped 12/12/16 2354)     Initial Impression / Assessment and Plan / ED Course  I have  reviewed the triage vital signs and the nursing notes.  Pertinent labs & imaging results that were available during my care of the patient were reviewed by me and considered in my medical decision making (see chart for details).    71 year old female with past medical history as above who presents with syncopal episode and subsequent musculoskeletal pain. Imaging negative for acute fracture or abnormality. Lab work overall reassuring, but does show moderate dehydration with positive UTI. I suspect her symptoms may be secondary to UTI with dehydration and orthostasis, though she is not overtly orthostatic here. Will give fluids, Rocephin, and admit given high risk features, generalized weakness, age, and history of CHF.  Final Clinical Impressions(s) / ED Diagnoses   Final diagnoses:  Fall  Syncope, unspecified syncope type  Lower urinary tract infectious disease  Dehydration    New Prescriptions Current Discharge Medication List       Duffy Bruce, MD 12/13/16 [phone]

## 2016-12-12 NOTE — ED Triage Notes (Signed)
Pt BIB EMS from home. Per patient, she was getting ready to put supper on the stove when she had a dizzy spell and fell onto the floor. Does not remember falling.  States this has happened before. +LOC. Takes aspirin daily. States she hit her head on the back of the floor. Complaining of right rib pain, left shoulder pain, lower back pain and pain in the back of her head.

## 2016-12-12 NOTE — H&P (Signed)
History and Physical    Kristen [name] WCB:[id number] DOB: Mar 20, 1946 DOA: 12/12/2016  PCP: Alycia Rossetti, MD   Patient coming from: Home  Chief Complaint: Syncope   HPI: Kristen [name] is a 71 y.o. female with medical history significant for depression, anxiety, insulin-dependent diabetes mellitus, hypertension, GERD, breast cancer status post resection, and CAD, now presenting to the emergency department after syncopal episode at home. Patient reports that she was in her usual state of health and had an uneventful day, but while she was in the kitchen preparing her dinner, she suffered a brief lapse in consciousness, waking on the floor with pain at the back of her head, right forearm, and hips. She denies any preceding chest pain or palpitations and denies any headache, change in vision or hearing, or focal numbness or weakness. Patient reports that she has been in her usual state with no recent illness, no recent fevers or chills, and no cough or dyspnea. She has had a similar episode a couple years ago for which she was evaluated in the hospital with no clear etiology determined at that time.   ED Course: Upon arrival to the ED, patient is found to be afebrile, saturating well on room air, negative orthostatic vital signs, and vitals otherwise stable. EKG features a sinus rhythm with first-degree AV nodal block, RBBB, and LAFB, similar to priors. Chest x-ray is negative for acute cardiopulmonary disease, radiographs of the hips are negative for acute fracture or dislocation, radiographs of the left humerus and right forearm are negative for acute pathology, noncontrast head CT is negative for acute intracranial abnormality, and cervical spine CT is negative for acute fracture. Chemistry panel reveals a serum glucose of 234 and serum creatinine 1.01 which appears consistent with the baseline. CBC is unremarkable, troponin is undetectable, and urinalysis suggests a possible infection. Urine was sent  for culture, 1 L of normal saline was given, and the patient was treated with Rocephin, Zofran, and morphine. She remained hemodynamically stable in the ED with no apparent respiratory distress and will be observed on the telemetry unit for ongoing evaluation and management of syncope.  Review of Systems:  All other systems reviewed and apart from HPI, are negative.  Past Medical History:  Diagnosis Date  . Allergy   . Anxiety   . Arthritis   . Blood transfusion without reported diagnosis   . Cancer ([place])    breast-left   . Chronic kidney disease    hx of kidney stones  . Coronary artery disease, non-occlusive    cath 2010 by Dr Lia Foyer revealed scattered nonobstrructive disease with preserved EF  . Depression   . Diabetes mellitus   . GERD (gastroesophageal reflux disease)   . Hemorrhoids   . Hyperlipidemia   . Hypertension   . Obesity   . Overactive bladder   . Vitamin D deficiency     Past Surgical History:  Procedure Laterality Date  . APPENDECTOMY    . BACK SURGERY    . BREAST SURGERY    . CARDIAC CATHETERIZATION  01/2009   cath by Dr Lia Foyer revealed nonobstructive CAD  . CESAREAN SECTION    . CHOLECYSTECTOMY    . CHOLECYSTECTOMY, LAPAROSCOPIC    . COLONOSCOPY     last 2012- with polyps  . JOINT REPLACEMENT    . LITHOTRIPSY    . MASTECTOMY W/ SENTINEL NODE BIOPSY Left 12/24/2012   Procedure: LEFT MASTECTOMY WITH LEFT SENTINEL LYMPH NODE BIOPSY;  Surgeon: Rolm Bookbinder, MD;  Location: [facility]  OR;  Service: General;  Laterality: Left;  . OOPHORECTOMY Right    1.5  ovaries rem  . PARTIAL HIP ARTHROPLASTY     left hip  . POLYPECTOMY    . REPLACEMENT TOTAL KNEE     both knees  . TONSILLECTOMY    . TOTAL MASTECTOMY Left 12/24/2012   Dr Donne Hazel     reports that she has never smoked. She has never used smokeless tobacco. She reports that she does not drink alcohol or use drugs.  Allergies  Allergen Reactions  . Sulfonamide Derivatives Hives    Family  History  Problem Relation Age of Onset  . Cancer Mother        mouth cancer  . Breast cancer Mother        possibly dx in her 70s  . Lung cancer Brother 75  . Lung cancer Brother   . Lung cancer Brother   . Heart attack Father 80  . Breast cancer Sister   . Brain cancer Brother   . Colon cancer Neg Hx   . Colon polyps Neg Hx   . Rectal cancer Neg Hx   . Stomach cancer Neg Hx   . Esophageal cancer Neg Hx      Prior to Admission medications   Medication Sig Start Date End Date Taking? Authorizing Provider  amLODipine (NORVASC) 2.5 MG tablet Take 1 tablet (2.5 mg total) by mouth daily. 07/22/16  Yes [place], Kristen Nunnery, MD  aspirin EC 81 MG tablet Take 81 mg by mouth daily.   Yes [provider]  atorvastatin (LIPITOR) 40 MG tablet TAKE 1 TABLET BY MOUTH DAILY AT 6 PM 07/22/16  Yes [place], Kristen Nunnery, MD  exemestane (AROMASIN) 25 MG tablet Take 1 tablet (25 mg total) by mouth daily after breakfast. 03/25/16  Yes Nicholas Lose, MD  ezetimibe (ZETIA) 10 MG tablet Take 1 tablet (10 mg total) by mouth daily. 07/22/16  Yes [place], Kristen Nunnery, MD  FLUoxetine (PROZAC) 40 MG capsule Take 1 capsule (40 mg total) by mouth daily. 07/22/16  Yes [place], Kristen Nunnery, MD  gabapentin (NEURONTIN) 300 MG capsule Take 1 capsule (300 mg total) by mouth 3 (three) times daily. 07/22/16  Yes [place], Kristen Nunnery, MD  ibuprofen (ADVIL,MOTRIN) 200 MG tablet Take 200 mg by mouth every 6 (six) hours as needed for pain.   Yes [provider]  insulin aspart (NOVOLOG FLEXPEN) 100 UNIT/ML FlexPen Inject 5- 50 units TID as directed with meals. Patient taking differently: Inject 8 Units into the skin 3 (three) times daily with meals.  07/22/16  Yes [place], Kristen Nunnery, MD  insulin degludec (TRESIBA FLEXTOUCH) 100 UNIT/ML SOPN FlexTouch Pen Inject 0.6 mLs (60 Units total) into the skin daily at 10 pm. 07/22/16  Yes [place], Kristen Nunnery, MD  metFORMIN (GLUCOPHAGE) 1000 MG tablet TAKE 1 TABLET BY MOUTH 2 TIMES DAILY WITH A MEAL.  07/22/16  Yes [place], Kristen Nunnery, MD  omeprazole (PRILOSEC) 20 MG capsule TAKE 1 CAPSULE BY MOUTH 2 TIMES DAILY. 07/22/16  Yes [place], Kristen Nunnery, MD  oxybutynin (DITROPAN) 5 MG tablet Take 1 tablet (5 mg total) by mouth 2 (two) times daily. 07/22/16  Yes [place], Kristen Nunnery, MD  blood glucose meter kit and supplies KIT Dispense based on patient and insurance preference. Use to monitor FSBS 3x daily. Dx: E11.41. 12/29/14   Alycia Rossetti, MD  glucose blood test strip Dispense based on patient and insurance preference. Use to monitor FSBS 3x daily. Dx: E11.41. 12/29/14  [place], Kristen Nunnery, MD  Lancets MISC Dispense based on patient and insurance preference. Use to monitor FSBS 3x daily. Dx: E11.41. 12/29/14   Alycia Rossetti, MD  UNIFINE PENTIPS 31G X 6 MM MISC USE WITH INSULIN FOUR TIMES DAILY 09/16/16   Alycia Rossetti, MD    Physical Exam: Vitals:   12/12/16 1910 12/12/16 1913 12/12/16 2159  BP: 137/87  134/76  Pulse: 80  79  Resp: 16  12  Temp: 98.7 F (37.1 C)    TempSrc: Oral    SpO2: 96% 96% 97%  Weight: 89.4 kg (197 lb)    Height: 5' 2"  (1.575 m)        Constitutional: NAD, calm, comfortable Eyes: PERTLA, lids and conjunctivae normal ENMT: Mucous membranes are moist. Posterior pharynx clear of any exudate or lesions.   Neck: normal, supple, no masses, no thyromegaly Respiratory: clear to auscultation bilaterally, no wheezing, no crackles. Normal respiratory effort.   Cardiovascular: S1 & S2 heard, regular rate and rhythm, soft systolic murmur at upper SB. No significant JVD. Abdomen: No distension, no tenderness, no masses palpated. Bowel sounds normal.  Musculoskeletal: no clubbing / cyanosis. No joint deformity upper and lower extremities.  Skin: no significant rashes, lesions, ulcers. Warm, dry, well-perfused. Neurologic: CN 2-12 grossly intact. Sensation intact, DTR normal. Strength 5/5 in all 4 limbs.  Psychiatric: Alert and oriented x 3. Pleasant and cooperative.     Labs  on Admission: I have personally reviewed following labs and imaging studies  CBC:  Recent Labs Lab 12/12/16 2111  WBC 9.1  NEUTROABS 5.7  HGB 12.0  HCT 37.9  MCV 86.1  PLT 882   Basic Metabolic Panel:  Recent Labs Lab 12/12/16 2111  NA 140  K 4.7  CL 102  CO2 24  GLUCOSE 234*  BUN 17  CREATININE 1.01*  CALCIUM 9.4   GFR: Estimated Creatinine Clearance: 53.1 mL/min (A) (by C-G formula based on SCr of 1.01 mg/dL (H)). Liver Function Tests:  Recent Labs Lab 12/12/16 2111  AST 20  ALT 24  ALKPHOS 96  BILITOT 0.6  PROT 7.2  ALBUMIN 3.7   No results for input(s): LIPASE, AMYLASE in the last 168 hours. No results for input(s): AMMONIA in the last 168 hours. Coagulation Profile: No results for input(s): INR, PROTIME in the last 168 hours. Cardiac Enzymes: No results for input(s): CKTOTAL, CKMB, CKMBINDEX, TROPONINI in the last 168 hours. BNP (last 3 results) No results for input(s): PROBNP in the last 8760 hours. HbA1C: No results for input(s): HGBA1C in the last 72 hours. CBG: No results for input(s): GLUCAP in the last 168 hours. Lipid Profile: No results for input(s): CHOL, HDL, LDLCALC, TRIG, CHOLHDL, LDLDIRECT in the last 72 hours. Thyroid Function Tests: No results for input(s): TSH, T4TOTAL, FREET4, T3FREE, THYROIDAB in the last 72 hours. Anemia Panel: No results for input(s): VITAMINB12, FOLATE, FERRITIN, TIBC, IRON, RETICCTPCT in the last 72 hours. Urine analysis:    Component Value Date/Time   COLORURINE YELLOW 12/12/2016 1956   APPEARANCEUR CLEAR 12/12/2016 1956   LABSPEC 1.010 12/12/2016 1956   PHURINE 6.0 12/12/2016 1956   GLUCOSEU 150 (A) 12/12/2016 1956   HGBUR SMALL (A) 12/12/2016 1956   BILIRUBINUR NEGATIVE 12/12/2016 1956   KETONESUR 5 (A) 12/12/2016 1956   PROTEINUR 30 (A) 12/12/2016 1956   UROBILINOGEN 0.2 02/10/2015 1820   NITRITE NEGATIVE 12/12/2016 1956   LEUKOCYTESUR LARGE (A) 12/12/2016 1956   Sepsis  Labs: @LABRCNTIP (procalcitonin:4,lacticidven:4) )No results found for this or  any previous visit (from the past 240 hour(s)).   Radiological Exams on Admission: Dg Chest 2 View  Result Date: 12/12/2016 CLINICAL DATA:  Loss of consciousness. Dizziness. Left breast cancer with mastectomy 5 years ago. EXAM: CHEST  2 VIEW COMPARISON:  02/11/2015 CXR FINDINGS: The heart size and mediastinal contours are within normal limits. Chronic stable mild elevation of right hemidiaphragm. Left axillary clips are present as before. Both lungs are clear. Chronic degenerative changes are seen along the dorsal spine without acute compression, lytic or blastic disease. IMPRESSION: No active cardiopulmonary disease. Electronically Signed   By: Ashley Royalty M.D.   On: 12/12/2016 21:06   Dg Forearm Right  Result Date: 12/12/2016 CLINICAL DATA:  Pain after fall.  Syncopal episode today. EXAM: RIGHT FOREARM - 2 VIEW COMPARISON:  None. FINDINGS: There is no evidence of fracture or other focal bone lesions. Intra-articular calcifications noted of the wrist and elbow which may reflect chondrocalcinosis. Changes of medial epicondylitis noted with soft tissue calcifications adjacent to the medial humeral condyle appear. IMPRESSION: 1. Negative for acute fracture or dislocation. 2. Chondrocalcinosis of the wrist and elbow joints with changes of medial epicondylitis. Electronically Signed   By: Ashley Royalty M.D.   On: 12/12/2016 21:09   Ct Head Wo Contrast  Result Date: 12/12/2016 CLINICAL DATA:  Fall in the kitchen after dizziness striking head on floor. Positive loss of consciousness. EXAM: CT HEAD WITHOUT CONTRAST CT CERVICAL SPINE WITHOUT CONTRAST TECHNIQUE: Multidetector CT imaging of the head and cervical spine was performed following the standard protocol without intravenous contrast. Multiplanar CT image reconstructions of the cervical spine were also generated. COMPARISON:  Head and face CT 02/10/2015 FINDINGS: CT HEAD FINDINGS  Brain: No evidence of acute infarction, hemorrhage, hydrocephalus, extra-axial collection or mass lesion/mass effect. Vascular: No hyperdense vessel or unexpected calcification. Skull: No fracture or focal lesion. Hyperostosis frontalis in incidentally noted and unchanged. Sinuses/Orbits: Paranasal sinuses and mastoid air cells are clear. The visualized orbits are unremarkable. Other: None. CT CERVICAL SPINE FINDINGS Alignment: Normal. Skull base and vertebrae: No acute fracture. Vertebral body heights are maintained. The dens and skull base are intact. Soft tissues and spinal canal: No prevertebral fluid or swelling. No visible canal hematoma. Disc levels: Disc space narrowing from C4-C5 through C6-C7 with endplate spurring. Multilevel facet arthropathy. Upper chest: No acute abnormality. Other: None. IMPRESSION: 1.  No acute intracranial abnormality.  No skull fracture. 2. No fracture or subluxation of the cervical spine. Degenerative disc disease and facet arthropathy. Electronically Signed   By: Jeb Levering M.D.   On: 12/12/2016 20:43   Ct Cervical Spine Wo Contrast  Result Date: 12/12/2016 CLINICAL DATA:  Fall in the kitchen after dizziness striking head on floor. Positive loss of consciousness. EXAM: CT HEAD WITHOUT CONTRAST CT CERVICAL SPINE WITHOUT CONTRAST TECHNIQUE: Multidetector CT imaging of the head and cervical spine was performed following the standard protocol without intravenous contrast. Multiplanar CT image reconstructions of the cervical spine were also generated. COMPARISON:  Head and face CT 02/10/2015 FINDINGS: CT HEAD FINDINGS Brain: No evidence of acute infarction, hemorrhage, hydrocephalus, extra-axial collection or mass lesion/mass effect. Vascular: No hyperdense vessel or unexpected calcification. Skull: No fracture or focal lesion. Hyperostosis frontalis in incidentally noted and unchanged. Sinuses/Orbits: Paranasal sinuses and mastoid air cells are clear. The visualized orbits  are unremarkable. Other: None. CT CERVICAL SPINE FINDINGS Alignment: Normal. Skull base and vertebrae: No acute fracture. Vertebral body heights are maintained. The dens and skull base are intact. Soft tissues  and spinal canal: No prevertebral fluid or swelling. No visible canal hematoma. Disc levels: Disc space narrowing from C4-C5 through C6-C7 with endplate spurring. Multilevel facet arthropathy. Upper chest: No acute abnormality. Other: None. IMPRESSION: 1.  No acute intracranial abnormality.  No skull fracture. 2. No fracture or subluxation of the cervical spine. Degenerative disc disease and facet arthropathy. Electronically Signed   By: Jeb Levering M.D.   On: 12/12/2016 20:43   Dg Humerus Left  Result Date: 12/12/2016 CLINICAL DATA:  Proximal left humeral pain after fall. EXAM: LEFT HUMERUS - 2+ VIEW COMPARISON:  None. FINDINGS: Soft tissue calcification adjacent to the humeral head consistent with rotator cuff tendinopathy. No fracture of the humerus nor bone destruction. No joint dislocations. Osteoarthritis of the [facility] joint with spurring. Small soft tissue calcification is seen adjacent to the acromion. IMPRESSION: 1. No acute fracture-dislocation of the left humerus. 2. AC joint osteoarthritis. 3. Soft tissue calcification adjacent to the humeral head may reflect calcific rotator cuff tendinopathy. Electronically Signed   By: Ashley Royalty M.D.   On: 12/12/2016 21:11   Dg Hip Unilat With Pelvis Min 4 Views Left  Result Date: 12/12/2016 CLINICAL DATA:  Left hip pain after fall. EXAM: DG HIP (WITH OR WITHOUT PELVIS) 4+V LEFT COMPARISON:  None. FINDINGS: There is no evidence of hip fracture or dislocation. Laminectomy defects at L2 through L5 with disc space narrowing and left lateral osteophyte formation at L4-5. Pedicle screws noted bilaterally at L3 and L4. Left hip arthroplasty with uncemented femoral stem is noted. No fracture is identified of the included lumbar spine, pelvis and both hips.  There is no evidence of arthropathy or other focal bone abnormality. IMPRESSION: 1. Negative for acute fracture nor dislocations of either hip. 2. Lower lumbar degenerative disc disease L4-5. 3. Laminectomy defects of the included lumbar spine from L2 caudad through L5. Electronically Signed   By: Ashley Royalty M.D.   On: 12/12/2016 21:15    EKG: Independently reviewed. Sinus rhythm, 1st AV block, RBBB, LAFB, similar to priors.   Assessment/Plan  1. Syncope  - Pt presents following a syncopal episode at home without prodrome  - EKG appears unchanged, troponin is undetectable, orthostatic vitals negative, head CT is negative, and pt reports being back to her baseline  - Etiology is not clear; plan to observe on telemetry, obtain echocardiogram, provide gentle IVF hydration, and treat apparent UTI    2. UTI  - UA is consistent with possible infection, pt has some vague lower abd discomfort that suggests this in an infection  - Urine is sent for culture, no prior positive urine cultures on file and no hx of MDR organisms  - She was treated with empiric Rocephin in ED, will continue pending culture data   3. Insulin-dependent DM  - A1c was 9.8% in May 2018  - She is managed at home with Tresiba 60 units qHS and Novolog 8 units TID  - Plan to check CBG with meals and qHS, use Lantus 60 units qHS, Novolog 4 units TID, and moderate-intensity sliding-scale correctional   4. Depression, anxiety  - Stable  - Continue Prozac    5. Hypertension  - BP at goal, continue Norvasc   6. CAD - No anginal complaints, troponin undetectable  - Continue Lipitor, ASA 81  - She will be monitored on telemetry as above    DVT prophylaxis: sq Lovenox Code Status: Full  Family Communication: Discussed with patient Disposition Plan: Observe on telemetry Consults called: None  Admission status: Observation    Vianne Bulls, MD [facility] Pager [phone]  If 7PM-7AM, please contact  night-coverage [email or web address] Password [facility]  12/12/2016, 11:34 PM

## 2016-12-12 NOTE — ED Notes (Signed)
Bed: WA09 Expected date:  Expected time:  Means of arrival:  Comments: Drug Use

## 2016-12-13 ENCOUNTER — Observation Stay (HOSPITAL_BASED_OUTPATIENT_CLINIC_OR_DEPARTMENT_OTHER): Payer: Medicare Other

## 2016-12-13 DIAGNOSIS — Z9010 Acquired absence of unspecified breast and nipple: Secondary | ICD-10-CM | POA: Diagnosis not present

## 2016-12-13 DIAGNOSIS — I360 Nonrheumatic tricuspid (valve) stenosis: Secondary | ICD-10-CM | POA: Diagnosis not present

## 2016-12-13 DIAGNOSIS — F319 Bipolar disorder, unspecified: Secondary | ICD-10-CM | POA: Diagnosis present

## 2016-12-13 DIAGNOSIS — E1121 Type 2 diabetes mellitus with diabetic nephropathy: Secondary | ICD-10-CM | POA: Diagnosis present

## 2016-12-13 DIAGNOSIS — I4510 Unspecified right bundle-branch block: Secondary | ICD-10-CM | POA: Diagnosis present

## 2016-12-13 DIAGNOSIS — Z23 Encounter for immunization: Secondary | ICD-10-CM | POA: Diagnosis not present

## 2016-12-13 DIAGNOSIS — Z853 Personal history of malignant neoplasm of breast: Secondary | ICD-10-CM | POA: Diagnosis not present

## 2016-12-13 DIAGNOSIS — F419 Anxiety disorder, unspecified: Secondary | ICD-10-CM | POA: Diagnosis present

## 2016-12-13 DIAGNOSIS — Z791 Long term (current) use of non-steroidal anti-inflammatories (NSAID): Secondary | ICD-10-CM | POA: Diagnosis not present

## 2016-12-13 DIAGNOSIS — R55 Syncope and collapse: Principal | ICD-10-CM

## 2016-12-13 DIAGNOSIS — Z794 Long term (current) use of insulin: Secondary | ICD-10-CM | POA: Diagnosis not present

## 2016-12-13 DIAGNOSIS — I5030 Unspecified diastolic (congestive) heart failure: Secondary | ICD-10-CM | POA: Diagnosis present

## 2016-12-13 DIAGNOSIS — K219 Gastro-esophageal reflux disease without esophagitis: Secondary | ICD-10-CM | POA: Diagnosis present

## 2016-12-13 DIAGNOSIS — E1122 Type 2 diabetes mellitus with diabetic chronic kidney disease: Secondary | ICD-10-CM | POA: Diagnosis present

## 2016-12-13 DIAGNOSIS — N189 Chronic kidney disease, unspecified: Secondary | ICD-10-CM | POA: Diagnosis present

## 2016-12-13 DIAGNOSIS — E860 Dehydration: Secondary | ICD-10-CM | POA: Diagnosis present

## 2016-12-13 DIAGNOSIS — Z6836 Body mass index (BMI) 36.0-36.9, adult: Secondary | ICD-10-CM | POA: Diagnosis not present

## 2016-12-13 DIAGNOSIS — W19XXXA Unspecified fall, initial encounter: Secondary | ICD-10-CM | POA: Diagnosis not present

## 2016-12-13 DIAGNOSIS — Z79899 Other long term (current) drug therapy: Secondary | ICD-10-CM | POA: Diagnosis not present

## 2016-12-13 DIAGNOSIS — Z96642 Presence of left artificial hip joint: Secondary | ICD-10-CM | POA: Diagnosis present

## 2016-12-13 DIAGNOSIS — Z96653 Presence of artificial knee joint, bilateral: Secondary | ICD-10-CM | POA: Diagnosis present

## 2016-12-13 DIAGNOSIS — I130 Hypertensive heart and chronic kidney disease with heart failure and stage 1 through stage 4 chronic kidney disease, or unspecified chronic kidney disease: Secondary | ICD-10-CM | POA: Diagnosis present

## 2016-12-13 DIAGNOSIS — E785 Hyperlipidemia, unspecified: Secondary | ICD-10-CM | POA: Diagnosis present

## 2016-12-13 DIAGNOSIS — N390 Urinary tract infection, site not specified: Secondary | ICD-10-CM | POA: Diagnosis present

## 2016-12-13 DIAGNOSIS — E1140 Type 2 diabetes mellitus with diabetic neuropathy, unspecified: Secondary | ICD-10-CM | POA: Diagnosis present

## 2016-12-13 DIAGNOSIS — Y92000 Kitchen of unspecified non-institutional (private) residence as  the place of occurrence of the external cause: Secondary | ICD-10-CM | POA: Diagnosis not present

## 2016-12-13 DIAGNOSIS — I2510 Atherosclerotic heart disease of native coronary artery without angina pectoris: Secondary | ICD-10-CM | POA: Diagnosis present

## 2016-12-13 LAB — BASIC METABOLIC PANEL
Anion gap: 8 (ref 5–15)
BUN: 20 mg/dL (ref 6–20)
CO2: 28 mmol/L (ref 22–32)
Calcium: 8.7 mg/dL — ABNORMAL LOW (ref 8.9–10.3)
Chloride: 104 mmol/L (ref 101–111)
Creatinine, Ser: 0.86 mg/dL (ref 0.44–1.00)
GFR calc Af Amer: >60 mL/min (ref >60)
GLUCOSE: 191 mg/dL — AB (ref 65–99)
POTASSIUM: 4.4 mmol/L (ref 3.5–5.1)
Sodium: 140 mmol/L (ref 135–145)

## 2016-12-13 LAB — CBC
HEMATOCRIT: 35.9 % — AB (ref 36.0–46.0)
Hemoglobin: 11.1 g/dL — ABNORMAL LOW (ref 12.0–15.0)
MCH: 26.9 pg (ref 26.0–34.0)
MCHC: 30.9 g/dL (ref 30.0–36.0)
MCV: 87.1 fL (ref 78.0–100.0)
Platelets: 230 10*3/uL (ref 150–400)
RBC: 4.12 MIL/uL (ref 3.87–5.11)
RDW: 15 % (ref 11.5–15.5)
WBC: 7.7 10*3/uL (ref 4.0–10.5)

## 2016-12-13 LAB — ECHOCARDIOGRAM COMPLETE
Height: 62 in
WEIGHTICAEL: 3185.21 [oz_av]

## 2016-12-13 LAB — GLUCOSE, CAPILLARY
GLUCOSE-CAPILLARY: 167 mg/dL — AB (ref 65–99)
GLUCOSE-CAPILLARY: 304 mg/dL — AB (ref 65–99)
Glucose-Capillary: 145 mg/dL — ABNORMAL HIGH (ref 65–99)
Glucose-Capillary: 158 mg/dL — ABNORMAL HIGH (ref 65–99)
Glucose-Capillary: 271 mg/dL — ABNORMAL HIGH (ref 65–99)
Glucose-Capillary: 329 mg/dL — ABNORMAL HIGH (ref 65–99)

## 2016-12-13 MED ORDER — MORPHINE SULFATE (PF) 10 MG/ML IV SOLN
4.0000 mg | Freq: Once | INTRAVENOUS | Status: AC
  Administered 2016-12-13: 4 mg via INTRAVENOUS
  Filled 2016-12-13: qty 1

## 2016-12-13 MED ORDER — PNEUMOCOCCAL VAC POLYVALENT 25 MCG/0.5ML IJ INJ
0.5000 mL | INJECTION | INTRAMUSCULAR | Status: AC
  Administered 2016-12-13: 0.5 mL via INTRAMUSCULAR
  Filled 2016-12-13: qty 0.5

## 2016-12-13 MED ORDER — OXYBUTYNIN CHLORIDE ER 5 MG PO TB24
5.0000 mg | ORAL_TABLET | Freq: Every day | ORAL | Status: DC
  Administered 2016-12-13: 5 mg via ORAL
  Filled 2016-12-13: qty 1

## 2016-12-13 NOTE — Progress Notes (Signed)
  Echocardiogram 2D Echocardiogram has been performed.  Kye Silverstein T Madelene Kaatz 12/13/2016, 3:48 PM

## 2016-12-13 NOTE — Progress Notes (Signed)
PT recommendations gone over with pt. She states she has used [facility] in the past for HHPT and would like to use them again. [facility] rep contacted for referral. Will need HHPT order. PT recommending 4 wheeled RW. Order received and [facility] rep alerted of order. Marney Doctor RN,BSN,NCM [phone]

## 2016-12-13 NOTE — Evaluation (Signed)
Physical Therapy Evaluation Patient Details Name: Finlee Concepcion MRN: [id number] DOB: [birth year] Today's Date: 12/13/2016   History of Present Illness  71 yo female admitted with syncope. Hx of syncopal episodes with collapse, breat cancer, CAD, DM, obesity, anxiety  Clinical Impression  On eval, pt was Min guard assist for mobility. She walked ~90 feet with a 4 wheeled walker. Pt c/o some pain and general weakness. She stated she gets no warning before she has syncopal episodes. Encouraged pt to mobilize with nursing supervision as tolerated. Will follow. Recommend HHPT and intermittent supervision/assist at this time.     Follow Up Recommendations Home health PT;Supervision - Intermittent    Equipment Recommendations   4 wheeled rolling walker   Recommendations for Other Services       Precautions / Restrictions Precautions Precautions: Fall Precaution Comments: syncopal episodes Restrictions Weight Bearing Restrictions: No      Mobility  Bed Mobility               General bed mobility comments: sitting EOB at start of session  Transfers Overall transfer level: Needs assistance Equipment used: 4-wheeled walker Transfers: Sit to/from Stand Sit to Stand: Modified independent (Device/Increase time)            Ambulation/Gait Ambulation/Gait assistance: Min guard Ambulation Distance (Feet): 90 Feet Assistive device: 4-wheeled walker Gait Pattern/deviations: Step-through pattern;Decreased stride length     General Gait Details: close guard for safety. slow gait speed. mild unsteadiness noted especially with head turns, changes in direction. Pt tolerated distance well.   Stairs            Wheelchair Mobility    Modified Rankin (Stroke Patients Only)       Balance Overall balance assessment: Needs assistance;History of Falls           Standing balance-Leahy Scale: Poor                               Pertinent Vitals/Pain Pain  Assessment: Faces Faces Pain Scale: Hurts little more Pain Location: ribs Pain Descriptors / Indicators: Sore Pain Intervention(s): Monitored during session;Repositioned    Home Living Family/patient expects to be discharged to:: Private residence Living Arrangements: Alone Available Help at Discharge: Family Type of Home: House Home Access: Stairs to enter Entrance Stairs-Rails: Right Entrance Stairs-Number of Steps: 3 Home Layout: One level Home Equipment: Environmental consultant - 4 wheels Additional Comments: rollator is old per pt    Prior Function Level of Independence: Independent with assistive device(s)         Comments: uses rollator for ambulation outside of home     Hand Dominance        Extremity/Trunk Assessment   Upper Extremity Assessment Upper Extremity Assessment: Generalized weakness    Lower Extremity Assessment Lower Extremity Assessment: Generalized weakness    Cervical / Trunk Assessment Cervical / Trunk Assessment: Normal  Communication   Communication: No difficulties  Cognition Arousal/Alertness: Awake/alert Behavior During Therapy: WFL for tasks assessed/performed Overall Cognitive Status: Within Functional Limits for tasks assessed                                        General Comments      Exercises     Assessment/Plan    PT Assessment Patient needs continued PT services  PT Problem List Decreased mobility;Decreased activity tolerance;Decreased balance;Pain;Decreased strength  PT Treatment Interventions DME instruction;Therapeutic activities;Gait training;Therapeutic exercise;Patient/family education;Balance training;Functional mobility training    PT Goals (Current goals can be found in the Care Plan section)  Acute Rehab PT Goals Patient Stated Goal: to find out what is causing syncopal episodes PT Goal Formulation: With patient Time For Goal Achievement: 12/27/16 Potential to Achieve Goals: Good     Frequency Min 3X/week   Barriers to discharge        Co-evaluation               AM-PAC PT "6 Clicks" Daily Activity  Outcome Measure Difficulty turning over in bed (including adjusting bedclothes, sheets and blankets)?: A Little Difficulty moving from lying on back to sitting on the side of the bed? : A Little Difficulty sitting down on and standing up from a chair with arms (e.g., wheelchair, bedside commode, etc,.)?: A Little Help needed moving to and from a bed to chair (including a wheelchair)?: A Little Help needed walking in hospital room?: A Little Help needed climbing 3-5 steps with a railing? : A Little 6 Click Score: 18    End of Session Equipment Utilized During Treatment: Gait belt Activity Tolerance: Patient tolerated treatment well Patient left: in chair;with call bell/phone within reach;with chair alarm set   PT Visit Diagnosis: Muscle weakness (generalized) (M62.81);Difficulty in walking, not elsewhere classified (R26.2)    Time: 7741-4239 PT Time Calculation (min) (ACUTE ONLY): 17 min   Charges:   PT Evaluation $PT Eval Low Complexity: 1 Procedure     PT G Codes:   PT G-Codes **NOT FOR INPATIENT CLASS** Functional Assessment Tool Used: AM-PAC 6 Clicks Basic Mobility;Clinical judgement Functional Limitation: Mobility: Walking and moving around Mobility: Walking and Moving Around Current Status (R3202): At least 1 percent but less than 20 percent impaired, limited or restricted Mobility: Walking and Moving Around Goal Status [phone]): At least 1 percent but less than 20 percent impaired, limited or restricted      Weston Anna, MPT Pager: [phone]

## 2016-12-13 NOTE — Progress Notes (Signed)
PROGRESS NOTE    Kristen [name]  OIZ:[id number] DOB: 03-08-1946 DOA: 12/12/2016 PCP: Alycia Rossetti, MD  Outpatient Specialists:   [facility]  Brief Narrative:   62?  diabetes mellitus about 10 years-last P3A 9, complicatio0n neuropathy, nephropathy HTN-not candidate for ace Morbid obesity, Body mass index is 36.41 kg/m.  Neg Card cath  3 2006 Prior  Br CA left upper left quadrant status postmastectomy May 2014  Aromasin Previous admission syncope 02/2015 resulting in nasal bone fracture-  ?'s 2/2 micturition syncopy vs orthostasis UTI, antihypertensive regimen 5 mm sessile polyp, internal hemorrhoids on colonoscopy 02/2016    admitted 12/12/2016  Patient states she was getting up from being seated went to stove to cook and could not get up, no loss of consciousness >10 seconds, no loss of continence, no tongue biting Usually uses a walker There are times when the patient gets up quickly and feels dizzy She lives alone and has lived this way for about 2-3 years She is aware that she should take time to get up and move around   Assessment & Plan:   Principal Problem:   Syncope Active Problems:   DEPRESSION/ANXIETY   Essential hypertension   CAD (coronary artery disease)   DM (diabetes mellitus), secondary uncontrolled ([place])   Acute lower UTI    simple syncope  Orthostatics were borderline positive 7/2 p.m.  On standing the patient up at bedside today patient had a recurrence of dizziness  Echocardiogram is pending, however her telemetry is benign with only PVCs  We will monitor overnight, repeat orthostatic vital signs  therapy recommending home health + 4 wheeled rolling walker   ? UTI/cystitis-uncomplicated  Follow UC  For now continue Rocephin  Bipolar  Cont Prozac 40 qd   Insulin dep DM ty II 9.8%  Holding Home Metformin 100o bid  SSI for now  Lantus 60 hs, Insulin 4 u qid ac  Sugars 158--271  CAD cath neg 08/2004 Nl LVEF mild MR in the  past  Continue   Sq Cell Dorsum 09/2003  S/p excision    lovenox No family  Consultants:   none  Procedures:   none  Antimicrobials:   none    Subjective:  Well alert oriented No distress Dizzy on standing today abruptly   Objective: Vitals:   12/13/16 0026 12/13/16 0038 12/13/16 0500 12/13/16 1012  BP: 131/69 113/66 115/72   Pulse: 67  65   Resp: 18  16   Temp: 98.2 F (36.8 C)  98.1 F (36.7 C) 98.1 F (36.7 C)  TempSrc: Oral  Oral Oral  SpO2: 100%  94% 98%  Weight: 89.6 kg (197 lb 8.5 oz)  90.3 kg (199 lb 1.2 oz)   Height: 5\' 2"  (1.575 m)       Intake/Output Summary (Last 24 hours) at 12/13/16 1516 Last data filed at 12/13/16 1020  Gross per 24 hour  Intake              650 ml  Output              250 ml  Net              400 ml   Filed Weights   12/12/16 1910 12/13/16 0026 12/13/16 0500  Weight: 89.4 kg (197 lb) 89.6 kg (197 lb 8.5 oz) 90.3 kg (199 lb 1.2 oz)    Examination:   eomi ncat No ict no pallor No jvd HSM cta b, no rales no rhonchi abd soft  nt dn no reboudn no guard No le edmea Neuro intact move 4 limbs =   Data Reviewed: I have personally reviewed following labs and imaging studies  CBC:  Recent Labs Lab 12/12/16 2111 12/13/16 0514  WBC 9.1 7.7  NEUTROABS 5.7  --   HGB 12.0 11.1*  HCT 37.9 35.9*  MCV 86.1 87.1  PLT 262 315   Basic Metabolic Panel:  Recent Labs Lab 12/12/16 2111 12/13/16 0514  NA 140 140  K 4.7 4.4  CL 102 104  CO2 24 28  GLUCOSE 234* 191*  BUN 17 20  CREATININE 1.01* 0.86  CALCIUM 9.4 8.7*   GFR: Estimated Creatinine Clearance: 62.7 mL/min (by C-G formula based on SCr of 0.86 mg/dL). Liver Function Tests:  Recent Labs Lab 12/12/16 2111  AST 20  ALT 24  ALKPHOS 96  BILITOT 0.6  PROT 7.2  ALBUMIN 3.7   No results for input(s): LIPASE, AMYLASE in the last 168 hours. No results for input(s): AMMONIA in the last 168 hours. Coagulation Profile: No results for input(s): INR,  PROTIME in the last 168 hours. Cardiac Enzymes: No results for input(s): CKTOTAL, CKMB, CKMBINDEX, TROPONINI in the last 168 hours. BNP (last 3 results) No results for input(s): PROBNP in the last 8760 hours. HbA1C: No results for input(s): HGBA1C in the last 72 hours. CBG:  Recent Labs Lab 12/13/16 0042 12/13/16 0634 12/13/16 0745 12/13/16 1229  GLUCAP 145* 167* 158* 271*   Lipid Profile: No results for input(s): CHOL, HDL, LDLCALC, TRIG, CHOLHDL, LDLDIRECT in the last 72 hours. Thyroid Function Tests: No results for input(s): TSH, T4TOTAL, FREET4, T3FREE, THYROIDAB in the last 72 hours. Anemia Panel: No results for input(s): VITAMINB12, FOLATE, FERRITIN, TIBC, IRON, RETICCTPCT in the last 72 hours. Urine analysis:    Component Value Date/Time   COLORURINE YELLOW 12/12/2016 1956   APPEARANCEUR CLEAR 12/12/2016 1956   LABSPEC 1.010 12/12/2016 1956   PHURINE 6.0 12/12/2016 1956   GLUCOSEU 150 (A) 12/12/2016 1956   HGBUR SMALL (A) 12/12/2016 1956   BILIRUBINUR NEGATIVE 12/12/2016 1956   KETONESUR 5 (A) 12/12/2016 1956   PROTEINUR 30 (A) 12/12/2016 1956   UROBILINOGEN 0.2 02/10/2015 1820   NITRITE NEGATIVE 12/12/2016 1956   LEUKOCYTESUR LARGE (A) 12/12/2016 1956   Sepsis Labs: @LABRCNTIP (procalcitonin:4,lacticidven:4)  )No results found for this or any previous visit (from the past 240 hour(s)).       Radiology Studies: Dg Chest 2 View  Result Date: 12/12/2016 CLINICAL DATA:  Loss of consciousness. Dizziness. Left breast cancer with mastectomy 5 years ago. EXAM: CHEST  2 VIEW COMPARISON:  02/11/2015 CXR FINDINGS: The heart size and mediastinal contours are within normal limits. Chronic stable mild elevation of right hemidiaphragm. Left axillary clips are present as before. Both lungs are clear. Chronic degenerative changes are seen along the dorsal spine without acute compression, lytic or blastic disease. IMPRESSION: No active cardiopulmonary disease. Electronically  Signed   By: Ashley Royalty M.D.   On: 12/12/2016 21:06   Dg Forearm Right  Result Date: 12/12/2016 CLINICAL DATA:  Pain after fall.  Syncopal episode today. EXAM: RIGHT FOREARM - 2 VIEW COMPARISON:  None. FINDINGS: There is no evidence of fracture or other focal bone lesions. Intra-articular calcifications noted of the wrist and elbow which may reflect chondrocalcinosis. Changes of medial epicondylitis noted with soft tissue calcifications adjacent to the medial humeral condyle appear. IMPRESSION: 1. Negative for acute fracture or dislocation. 2. Chondrocalcinosis of the wrist and elbow joints with changes of medial  epicondylitis. Electronically Signed   By: Ashley Royalty M.D.   On: 12/12/2016 21:09   Ct Head Wo Contrast  Result Date: 12/12/2016 CLINICAL DATA:  Fall in the kitchen after dizziness striking head on floor. Positive loss of consciousness. EXAM: CT HEAD WITHOUT CONTRAST CT CERVICAL SPINE WITHOUT CONTRAST TECHNIQUE: Multidetector CT imaging of the head and cervical spine was performed following the standard protocol without intravenous contrast. Multiplanar CT image reconstructions of the cervical spine were also generated. COMPARISON:  Head and face CT 02/10/2015 FINDINGS: CT HEAD FINDINGS Brain: No evidence of acute infarction, hemorrhage, hydrocephalus, extra-axial collection or mass lesion/mass effect. Vascular: No hyperdense vessel or unexpected calcification. Skull: No fracture or focal lesion. Hyperostosis frontalis in incidentally noted and unchanged. Sinuses/Orbits: Paranasal sinuses and mastoid air cells are clear. The visualized orbits are unremarkable. Other: None. CT CERVICAL SPINE FINDINGS Alignment: Normal. Skull base and vertebrae: No acute fracture. Vertebral body heights are maintained. The dens and skull base are intact. Soft tissues and spinal canal: No prevertebral fluid or swelling. No visible canal hematoma. Disc levels: Disc space narrowing from C4-C5 through C6-C7 with endplate  spurring. Multilevel facet arthropathy. Upper chest: No acute abnormality. Other: None. IMPRESSION: 1.  No acute intracranial abnormality.  No skull fracture. 2. No fracture or subluxation of the cervical spine. Degenerative disc disease and facet arthropathy. Electronically Signed   By: Jeb Levering M.D.   On: 12/12/2016 20:43   Ct Cervical Spine Wo Contrast  Result Date: 12/12/2016 CLINICAL DATA:  Fall in the kitchen after dizziness striking head on floor. Positive loss of consciousness. EXAM: CT HEAD WITHOUT CONTRAST CT CERVICAL SPINE WITHOUT CONTRAST TECHNIQUE: Multidetector CT imaging of the head and cervical spine was performed following the standard protocol without intravenous contrast. Multiplanar CT image reconstructions of the cervical spine were also generated. COMPARISON:  Head and face CT 02/10/2015 FINDINGS: CT HEAD FINDINGS Brain: No evidence of acute infarction, hemorrhage, hydrocephalus, extra-axial collection or mass lesion/mass effect. Vascular: No hyperdense vessel or unexpected calcification. Skull: No fracture or focal lesion. Hyperostosis frontalis in incidentally noted and unchanged. Sinuses/Orbits: Paranasal sinuses and mastoid air cells are clear. The visualized orbits are unremarkable. Other: None. CT CERVICAL SPINE FINDINGS Alignment: Normal. Skull base and vertebrae: No acute fracture. Vertebral body heights are maintained. The dens and skull base are intact. Soft tissues and spinal canal: No prevertebral fluid or swelling. No visible canal hematoma. Disc levels: Disc space narrowing from C4-C5 through C6-C7 with endplate spurring. Multilevel facet arthropathy. Upper chest: No acute abnormality. Other: None. IMPRESSION: 1.  No acute intracranial abnormality.  No skull fracture. 2. No fracture or subluxation of the cervical spine. Degenerative disc disease and facet arthropathy. Electronically Signed   By: Jeb Levering M.D.   On: 12/12/2016 20:43   Dg Humerus Left  Result  Date: 12/12/2016 CLINICAL DATA:  Proximal left humeral pain after fall. EXAM: LEFT HUMERUS - 2+ VIEW COMPARISON:  None. FINDINGS: Soft tissue calcification adjacent to the humeral head consistent with rotator cuff tendinopathy. No fracture of the humerus nor bone destruction. No joint dislocations. Osteoarthritis of the [facility] joint with spurring. Small soft tissue calcification is seen adjacent to the acromion. IMPRESSION: 1. No acute fracture-dislocation of the left humerus. 2. AC joint osteoarthritis. 3. Soft tissue calcification adjacent to the humeral head may reflect calcific rotator cuff tendinopathy. Electronically Signed   By: Ashley Royalty M.D.   On: 12/12/2016 21:11   Dg Hip Unilat With Pelvis Min 4 Views Left  Result  Date: 12/12/2016 CLINICAL DATA:  Left hip pain after fall. EXAM: DG HIP (WITH OR WITHOUT PELVIS) 4+V LEFT COMPARISON:  None. FINDINGS: There is no evidence of hip fracture or dislocation. Laminectomy defects at L2 through L5 with disc space narrowing and left lateral osteophyte formation at L4-5. Pedicle screws noted bilaterally at L3 and L4. Left hip arthroplasty with uncemented femoral stem is noted. No fracture is identified of the included lumbar spine, pelvis and both hips. There is no evidence of arthropathy or other focal bone abnormality. IMPRESSION: 1. Negative for acute fracture nor dislocations of either hip. 2. Lower lumbar degenerative disc disease L4-5. 3. Laminectomy defects of the included lumbar spine from L2 caudad through L5. Electronically Signed   By: Ashley Royalty M.D.   On: 12/12/2016 21:15        Scheduled Meds: . aspirin EC  81 mg Oral Daily  . atorvastatin  40 mg Oral q1800  . enoxaparin (LOVENOX) injection  40 mg Subcutaneous QHS  . exemestane  25 mg Oral QPC breakfast  . ezetimibe  10 mg Oral Daily  . FLUoxetine  40 mg Oral Daily  . gabapentin  300 mg Oral TID  . insulin aspart  0-15 Units Subcutaneous TID WC  . insulin aspart  0-5 Units Subcutaneous QHS   . insulin aspart  4 Units Subcutaneous TID WC  . insulin glargine  60 Units Subcutaneous QHS  . oxybutynin  5 mg Oral QHS  . pantoprazole  40 mg Oral BID  . pneumococcal 23 valent vaccine  0.5 mL Intramuscular Tomorrow-1000  . sodium chloride flush  3 mL Intravenous Q12H   Continuous Infusions: . cefTRIAXone (ROCEPHIN)  IV       LOS: 0 days    Time spent: [place], MD [facility] ([facility]   If 7PM-7AM, please contact night-coverage [email or web address] Password [email or web address] 12/13/2016, 3:16 PM

## 2016-12-14 LAB — URINE CULTURE

## 2016-12-14 LAB — GLUCOSE, CAPILLARY
GLUCOSE-CAPILLARY: 279 mg/dL — AB (ref 65–99)
GLUCOSE-CAPILLARY: 418 mg/dL — AB (ref 65–99)

## 2016-12-14 MED ORDER — POLYETHYLENE GLYCOL 3350 17 G PO PACK: 17.0000 g | PACK | Freq: Every day | ORAL | 0 refills | Status: DC | PRN

## 2016-12-14 MED ORDER — OXYBUTYNIN CHLORIDE ER 5 MG PO TB24: 5.0000 mg | ORAL_TABLET | Freq: Every day | ORAL | 0 refills | Status: DC

## 2016-12-14 NOTE — Discharge Summary (Signed)
Physician Discharge Summary  Kristen [name] BMW:[id number] DOB: 1946/03/11 DOA: 12/12/2016  PCP: Alycia Rossetti, MD  Admit date: 12/12/2016 Discharge date: 12/14/2016  Time spent: 35 minutes  Recommendations for Outpatient Follow-up:  1. 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 Recommend complete metabolic panel as an outpatient 3. Recommend home health PT OT as well as home health orders which we have placed 4. Medication changes this admission-oxybutynin twice a day--oxybutynin XL daily at bedtime  Discharge Diagnoses:  Principal Problem:   Syncope Active Problems:   DEPRESSION/ANXIETY   Essential hypertension   CAD (coronary artery disease)   DM (diabetes mellitus), secondary uncontrolled ([place])   Acute lower UTI   Discharge Condition: Improved  Diet recommendation: Heart healthy low-salt  Filed Weights   12/13/16 0026 12/13/16 0500 12/14/16 0534  Weight: 89.6 kg (197 lb 8.5 oz) 90.3 kg (199 lb 1.2 oz) 89.8 kg (197 lb 15.6 oz)    History of present illness:  56?  diabetes mellitus about 10 years-last U7O 9, complication neuropathy, nephropathy HTN-not candidate for ace Morbid obesity, Body mass index is 36.21 kg/m.  Neg Card cath  3 2006 Prior  Br CA left upper left quadrant status postmastectomy May 2014             Aromasin Previous admission syncope 02/2015 resulting in nasal bone fracture-             ?'s 2/2 micturition syncopy vs orthostasis UTI, antihypertensive regimen 5 mm sessile polyp, internal hemorrhoids on colonoscopy 02/2016               admitted 12/12/2016  Patient states she was getting up from being seated went to stove to cook and could not get up, no loss of consciousness >10 seconds, no loss of continence, no tongue biting Usually uses a walker There are times when the patient gets up quickly and feels dizzy She lives alone and has  lived this way for about 2-3 years She is aware that she should take time to get up and move around   Hospital Course:   simple syncope             Orthostatics were borderline positive 7/2 p.m., telemetry has remained benign since admission             On standing the patient up at bedside today patient had a recurrence of dizziness             Echocardiogram shows no changes from prior and no valvular abnormalities suggestive of this being cause             We will monitor overnight, repeat orthostatic vital signs             therapy recommending home health + 4 wheeled rolling walker    ? UTI/cystitis-uncomplicated--patient urinated in a couple in the emergency room and does not have any dysuria at present  Discontinued Rocephin day of discharge, would not treat for cystitis as this is asymptomatic bacteriuria              Bipolar             Cont Prozac 40 qd              Insulin dep DM ty II 9.8%             Holding Home Metformin 100o bid  SSI for now             Lantus 60 hs, Insulin 4 u qid ac             Sugars 158--271, on discharge resumed home insulin regimen as well as metformin   CAD cath neg 08/2004 Nl LVEF mild MR in the past              echocardiogram this admission and changed as above              Sq Cell Dorsum 09/2003             S/p excision    Procedures: Study Conclusions   - Left ventricle: The cavity size was normal. Wall thickness was   increased in a pattern of moderate LVH. Systolic function was   normal. The estimated ejection fraction was in the range of 55%   to 60%. Normal GLPSS at -21%. Wall motion was normal; there were   no regional wall motion abnormalities. Doppler parameters are   consistent with abnormal left ventricular relaxation (grade 1   diastolic dysfunction). The E/e&' ratio is between 8-15,   suggesting indeterminate LV filling pressure. - Aortic valve: Trileaflet. Sclerosis without stenosis. There was   no  regurgitation. - Tricuspid valve: There was mild regurgitation. - Pulmonary arteries: PA peak pressure: 34 mm Hg (S). - Inferior vena cava: The vessel was normal in size. The   respirophasic diameter changes were in the normal range (>= 50%),   consistent with normal central venous pressure. x-rays)  none Vitals:   12/14/16 0111 12/14/16 0534  BP: (!) 102/52 (!) 123/58  Pulse: 66 70  Resp: 20 18  Temp: 98.8 F (37.1 C) 98.6 F (37 C)    EOMI NCAT No icterus no pallor Eating and drinking Chest is clinically clear Ambulating with walker about 75 steps with steady gait minimum guard Neurologically intact range of motion intact grossly to major muscle groups still has slight right rib pain from fall   Discharge Instructions   Discharge Instructions    Diet - low sodium heart healthy    Complete by:  As directed    Discharge instructions    Complete by:  As directed    Patient will need blood pressure checks at home and will need reevaluation of some of her home meds by her primary care physician. I would suggest that the patient get a short acting blood pressure medicine such as clonidine for when necessary blood pressures >160 but will defer this to primary care physician-I would recommend that you take her amlodipine in the middle of the day rather than at the beginning of the day and make sure that you're well hydrated We will recommend also that you get stockings as an outpatient once he discussed this with your primary physician   Increase activity slowly    Complete by:  As directed      Current Discharge Medication List    START taking these medications   Details  oxybutynin (DITROPAN-XL) 5 MG 24 hr tablet Take 1 tablet (5 mg total) by mouth at bedtime. Qty: 30 tablet, Refills: 0    polyethylene glycol (MIRALAX / GLYCOLAX) packet Take 17 g by mouth daily as needed for mild constipation. Qty: 14 each, Refills: 0      CONTINUE these medications which have NOT  CHANGED   Details  amLODipine (NORVASC) 2.5 MG tablet Take 1 tablet (2.5 mg total) by mouth daily.  Qty: 90 tablet, Refills: 2    aspirin EC 81 MG tablet Take 81 mg by mouth daily.    atorvastatin (LIPITOR) 40 MG tablet TAKE 1 TABLET BY MOUTH DAILY AT 6 PM Qty: 90 tablet, Refills: 2    exemestane (AROMASIN) 25 MG tablet Take 1 tablet (25 mg total) by mouth daily after breakfast. Qty: 90 tablet, Refills: 3    FLUoxetine (PROZAC) 40 MG capsule Take 1 capsule (40 mg total) by mouth daily. Qty: 90 capsule, Refills: 2    gabapentin (NEURONTIN) 300 MG capsule Take 1 capsule (300 mg total) by mouth 3 (three) times daily. Qty: 270 capsule, Refills: 2   Associated Diagnoses: Diabetic mononeuropathy associated with diabetes mellitus due to underlying condition ([place])    insulin aspart (NOVOLOG FLEXPEN) 100 UNIT/ML FlexPen Inject 5- 50 units TID as directed with meals. Qty: 45 mL, Refills: 11    insulin degludec (TRESIBA FLEXTOUCH) 100 UNIT/ML SOPN FlexTouch Pen Inject 0.6 mLs (60 Units total) into the skin daily at 10 pm. Qty: 30 mL, Refills: 11    metFORMIN (GLUCOPHAGE) 1000 MG tablet TAKE 1 TABLET BY MOUTH 2 TIMES DAILY WITH A MEAL. Qty: 180 tablet, Refills: 2    omeprazole (PRILOSEC) 20 MG capsule TAKE 1 CAPSULE BY MOUTH 2 TIMES DAILY. Qty: 180 capsule, Refills: 2    blood glucose meter kit and supplies KIT Dispense based on patient and insurance preference. Use to monitor FSBS 3x daily. Dx: E11.41. Qty: 1 each, Refills: 0    glucose blood test strip Dispense based on patient and insurance preference. Use to monitor FSBS 3x daily. Dx: E11.41. Qty: 300 each, Refills: 3    Lancets MISC Dispense based on patient and insurance preference. Use to monitor FSBS 3x daily. Dx: E11.41. Qty: 300 each, Refills: 3    UNIFINE PENTIPS 31G X 6 MM MISC USE WITH INSULIN FOUR TIMES DAILY Qty: 100 each, Refills: PRN      STOP taking these medications     ezetimibe (ZETIA) 10 MG tablet       ibuprofen (ADVIL,MOTRIN) 200 MG tablet      oxybutynin (DITROPAN) 5 MG tablet        Allergies  Allergen Reactions  . Sulfonamide Derivatives Hives   Follow-up Information    Home, [facility] Follow up.   Specialty:  [facility] Contact information: [address] [place] [address] [phone]            The results of significant diagnostics from this hospitalization (including imaging, microbiology, ancillary and laboratory) are listed below for reference.    Significant Diagnostic Studies: Dg Chest 2 View  Result Date: 12/12/2016 CLINICAL DATA:  Loss of consciousness. Dizziness. Left breast cancer with mastectomy 5 years ago. EXAM: CHEST  2 VIEW COMPARISON:  02/11/2015 CXR FINDINGS: The heart size and mediastinal contours are within normal limits. Chronic stable mild elevation of right hemidiaphragm. Left axillary clips are present as before. Both lungs are clear. Chronic degenerative changes are seen along the dorsal spine without acute compression, lytic or blastic disease. IMPRESSION: No active cardiopulmonary disease. Electronically Signed   By: Ashley Royalty M.D.   On: 12/12/2016 21:06   Dg Forearm Right  Result Date: 12/12/2016 CLINICAL DATA:  Pain after fall.  Syncopal episode today. EXAM: RIGHT FOREARM - 2 VIEW COMPARISON:  None. FINDINGS: There is no evidence of fracture or other focal bone lesions. Intra-articular calcifications noted of the wrist and elbow which may reflect chondrocalcinosis. Changes  of medial epicondylitis noted with soft tissue calcifications adjacent to the medial humeral condyle appear. IMPRESSION: 1. Negative for acute fracture or dislocation. 2. Chondrocalcinosis of the wrist and elbow joints with changes of medial epicondylitis. Electronically Signed   By: Ashley Royalty M.D.   On: 12/12/2016 21:09   Ct Head Wo Contrast  Result Date: 12/12/2016 CLINICAL DATA:  Fall in the kitchen after dizziness striking head on floor. Positive  loss of consciousness. EXAM: CT HEAD WITHOUT CONTRAST CT CERVICAL SPINE WITHOUT CONTRAST TECHNIQUE: Multidetector CT imaging of the head and cervical spine was performed following the standard protocol without intravenous contrast. Multiplanar CT image reconstructions of the cervical spine were also generated. COMPARISON:  Head and face CT 02/10/2015 FINDINGS: CT HEAD FINDINGS Brain: No evidence of acute infarction, hemorrhage, hydrocephalus, extra-axial collection or mass lesion/mass effect. Vascular: No hyperdense vessel or unexpected calcification. Skull: No fracture or focal lesion. Hyperostosis frontalis in incidentally noted and unchanged. Sinuses/Orbits: Paranasal sinuses and mastoid air cells are clear. The visualized orbits are unremarkable. Other: None. CT CERVICAL SPINE FINDINGS Alignment: Normal. Skull base and vertebrae: No acute fracture. Vertebral body heights are maintained. The dens and skull base are intact. Soft tissues and spinal canal: No prevertebral fluid or swelling. No visible canal hematoma. Disc levels: Disc space narrowing from C4-C5 through C6-C7 with endplate spurring. Multilevel facet arthropathy. Upper chest: No acute abnormality. Other: None. IMPRESSION: 1.  No acute intracranial abnormality.  No skull fracture. 2. No fracture or subluxation of the cervical spine. Degenerative disc disease and facet arthropathy. Electronically Signed   By: Jeb Levering M.D.   On: 12/12/2016 20:43   Ct Cervical Spine Wo Contrast  Result Date: 12/12/2016 CLINICAL DATA:  Fall in the kitchen after dizziness striking head on floor. Positive loss of consciousness. EXAM: CT HEAD WITHOUT CONTRAST CT CERVICAL SPINE WITHOUT CONTRAST TECHNIQUE: Multidetector CT imaging of the head and cervical spine was performed following the standard protocol without intravenous contrast. Multiplanar CT image reconstructions of the cervical spine were also generated. COMPARISON:  Head and face CT 02/10/2015 FINDINGS:  CT HEAD FINDINGS Brain: No evidence of acute infarction, hemorrhage, hydrocephalus, extra-axial collection or mass lesion/mass effect. Vascular: No hyperdense vessel or unexpected calcification. Skull: No fracture or focal lesion. Hyperostosis frontalis in incidentally noted and unchanged. Sinuses/Orbits: Paranasal sinuses and mastoid air cells are clear. The visualized orbits are unremarkable. Other: None. CT CERVICAL SPINE FINDINGS Alignment: Normal. Skull base and vertebrae: No acute fracture. Vertebral body heights are maintained. The dens and skull base are intact. Soft tissues and spinal canal: No prevertebral fluid or swelling. No visible canal hematoma. Disc levels: Disc space narrowing from C4-C5 through C6-C7 with endplate spurring. Multilevel facet arthropathy. Upper chest: No acute abnormality. Other: None. IMPRESSION: 1.  No acute intracranial abnormality.  No skull fracture. 2. No fracture or subluxation of the cervical spine. Degenerative disc disease and facet arthropathy. Electronically Signed   By: Jeb Levering M.D.   On: 12/12/2016 20:43   Dg Humerus Left  Result Date: 12/12/2016 CLINICAL DATA:  Proximal left humeral pain after fall. EXAM: LEFT HUMERUS - 2+ VIEW COMPARISON:  None. FINDINGS: Soft tissue calcification adjacent to the humeral head consistent with rotator cuff tendinopathy. No fracture of the humerus nor bone destruction. No joint dislocations. Osteoarthritis of the [facility] joint with spurring. Small soft tissue calcification is seen adjacent to the acromion. IMPRESSION: 1. No acute fracture-dislocation of the left humerus. 2. AC joint osteoarthritis. 3. Soft tissue calcification adjacent to the  humeral head may reflect calcific rotator cuff tendinopathy. Electronically Signed   By: Ashley Royalty M.D.   On: 12/12/2016 21:11   Dg Hip Unilat With Pelvis Min 4 Views Left  Result Date: 12/12/2016 CLINICAL DATA:  Left hip pain after fall. EXAM: DG HIP (WITH OR WITHOUT PELVIS) 4+V LEFT  COMPARISON:  None. FINDINGS: There is no evidence of hip fracture or dislocation. Laminectomy defects at L2 through L5 with disc space narrowing and left lateral osteophyte formation at L4-5. Pedicle screws noted bilaterally at L3 and L4. Left hip arthroplasty with uncemented femoral stem is noted. No fracture is identified of the included lumbar spine, pelvis and both hips. There is no evidence of arthropathy or other focal bone abnormality. IMPRESSION: 1. Negative for acute fracture nor dislocations of either hip. 2. Lower lumbar degenerative disc disease L4-5. 3. Laminectomy defects of the included lumbar spine from L2 caudad through L5. Electronically Signed   By: Ashley Royalty M.D.   On: 12/12/2016 21:15    Microbiology: No results found for this or any previous visit (from the past 240 hour(s)).   Labs: Basic Metabolic Panel:  Recent Labs Lab 12/12/16 2111 12/13/16 0514  NA 140 140  K 4.7 4.4  CL 102 104  CO2 24 28  GLUCOSE 234* 191*  BUN 17 20  CREATININE 1.01* 0.86  CALCIUM 9.4 8.7*   Liver Function Tests:  Recent Labs Lab 12/12/16 2111  AST 20  ALT 24  ALKPHOS 96  BILITOT 0.6  PROT 7.2  ALBUMIN 3.7   No results for input(s): LIPASE, AMYLASE in the last 168 hours. No results for input(s): AMMONIA in the last 168 hours. CBC:  Recent Labs Lab 12/12/16 2111 12/13/16 0514  WBC 9.1 7.7  NEUTROABS 5.7  --   HGB 12.0 11.1*  HCT 37.9 35.9*  MCV 86.1 87.1  PLT 262 230   Cardiac Enzymes: No results for input(s): CKTOTAL, CKMB, CKMBINDEX, TROPONINI in the last 168 hours. BNP: BNP (last 3 results) No results for input(s): BNP in the last 8760 hours.  ProBNP (last 3 results) No results for input(s): PROBNP in the last 8760 hours.  CBG:  Recent Labs Lab 12/13/16 0745 12/13/16 1229 12/13/16 1658 12/13/16 2035 12/14/16 0728  GLUCAP 158* 271* 329* 304* 279*       Signed:  Nita Sells MD   [facility] 12/14/2016, 10:35 AM

## 2016-12-14 NOTE — Progress Notes (Signed)
Physical Therapy Treatment Patient Details Name: Kristen [name] MRN: [id number] DOB: 11-07-1945 Today's Date: 12/14/2016    History of Present Illness 71 yo female admitted with syncope. Hx of syncopal episodes with collapse, breat cancer, CAD, DM, obesity, anxiety    PT Comments    Pt tolerated ambulation with rollator well and reports d/c home today.  Follow Up Recommendations  Home health PT;Supervision - Intermittent     Equipment Recommendations   (4 wheel walker in room)    Recommendations for Other Services       Precautions / Restrictions Precautions Precautions: Fall Precaution Comments: syncopal episodes Restrictions Weight Bearing Restrictions: No    Mobility  Bed Mobility Overal bed mobility: Modified Independent                Transfers Overall transfer level: Needs assistance Equipment used: 4-wheeled walker Transfers: Sit to/from Stand Sit to Stand: Modified independent (Device/Increase time)         General transfer comment: pt uses brakes appropriately, pt's new rollator in room so adjusted arms for pt's height  Ambulation/Gait Ambulation/Gait assistance: Min guard Ambulation Distance (Feet): 160 Feet Assistive device: 4-wheeled walker Gait Pattern/deviations: Step-through pattern;Decreased stride length     General Gait Details: slow but steady gait, able to increase distance today, denies dizziness, reported a little LE weakness however states this improved with distance   Stairs            Wheelchair Mobility    Modified Rankin (Stroke Patients Only)       Balance                                            Cognition Arousal/Alertness: Awake/alert Behavior During Therapy: WFL for tasks assessed/performed Overall Cognitive Status: Within Functional Limits for tasks assessed                                        Exercises      General Comments        Pertinent Vitals/Pain  Pain Assessment: No/denies pain Pain Intervention(s): Repositioned    Home Living                      Prior Function            PT Goals (current goals can now be found in the care plan section) Progress towards PT goals: Progressing toward goals    Frequency    Min 3X/week      PT Plan Current plan remains appropriate    Co-evaluation              AM-PAC PT "6 Clicks" Daily Activity  Outcome Measure  Difficulty turning over in bed (including adjusting bedclothes, sheets and blankets)?: None Difficulty moving from lying on back to sitting on the side of the bed? : None Difficulty sitting down on and standing up from a chair with arms (e.g., wheelchair, bedside commode, etc,.)?: A Little Help needed moving to and from a bed to chair (including a wheelchair)?: A Little Help needed walking in hospital room?: A Little Help needed climbing 3-5 steps with a railing? : A Little 6 Click Score: 20    End of Session Equipment Utilized During Treatment: Gait belt Activity Tolerance: Patient tolerated treatment well  Patient left: in chair;with call bell/phone within reach (battery for chair alarm needed, RN aware)   PT Visit Diagnosis: Muscle weakness (generalized) (M62.81);Difficulty in walking, not elsewhere classified (R26.2)     Time: 3159-4585 PT Time Calculation (min) (ACUTE ONLY): 12 min  Charges:  $Gait Training: 8-22 mins                    G Codes:       Carmelia Bake, PT, DPT 12/14/2016 Pager: [phone]    York Ram E 12/14/2016, 12:07 PM

## 2016-12-18 ENCOUNTER — Inpatient Hospital Stay (HOSPITAL_COMMUNITY)
Admission: EM | Admit: 2016-12-18 | Discharge: 2016-12-22 | DRG: 244 | Disposition: A | Discharge status: Skilled Nursing Facility | Payer: Medicare Other | Attending: Internal Medicine | Admitting: Internal Medicine

## 2016-12-18 ENCOUNTER — Observation Stay (HOSPITAL_COMMUNITY): Payer: Medicare Other

## 2016-12-18 ENCOUNTER — Encounter (HOSPITAL_COMMUNITY): Payer: Self-pay | Admitting: Emergency Medicine

## 2016-12-18 ENCOUNTER — Emergency Department (HOSPITAL_COMMUNITY): Payer: Medicare Other

## 2016-12-18 DIAGNOSIS — IMO0002 Reserved for concepts with insufficient information to code with codable children: Secondary | ICD-10-CM

## 2016-12-18 DIAGNOSIS — R55 Syncope and collapse: Secondary | ICD-10-CM

## 2016-12-18 DIAGNOSIS — I452 Bifascicular block: Principal | ICD-10-CM | POA: Diagnosis present

## 2016-12-18 DIAGNOSIS — I2510 Atherosclerotic heart disease of native coronary artery without angina pectoris: Secondary | ICD-10-CM | POA: Diagnosis present

## 2016-12-18 DIAGNOSIS — E860 Dehydration: Secondary | ICD-10-CM | POA: Diagnosis not present

## 2016-12-18 DIAGNOSIS — Z96642 Presence of left artificial hip joint: Secondary | ICD-10-CM | POA: Diagnosis present

## 2016-12-18 DIAGNOSIS — Z79811 Long term (current) use of aromatase inhibitors: Secondary | ICD-10-CM

## 2016-12-18 DIAGNOSIS — E1365 Other specified diabetes mellitus with hyperglycemia: Secondary | ICD-10-CM | POA: Diagnosis not present

## 2016-12-18 DIAGNOSIS — D649 Anemia, unspecified: Secondary | ICD-10-CM | POA: Diagnosis present

## 2016-12-18 DIAGNOSIS — E669 Obesity, unspecified: Secondary | ICD-10-CM | POA: Diagnosis present

## 2016-12-18 DIAGNOSIS — E1140 Type 2 diabetes mellitus with diabetic neuropathy, unspecified: Secondary | ICD-10-CM | POA: Diagnosis present

## 2016-12-18 DIAGNOSIS — Z803 Family history of malignant neoplasm of breast: Secondary | ICD-10-CM

## 2016-12-18 DIAGNOSIS — Z882 Allergy status to sulfonamides status: Secondary | ICD-10-CM

## 2016-12-18 DIAGNOSIS — I453 Trifascicular block: Secondary | ICD-10-CM | POA: Diagnosis present

## 2016-12-18 DIAGNOSIS — I10 Essential (primary) hypertension: Secondary | ICD-10-CM | POA: Diagnosis not present

## 2016-12-18 DIAGNOSIS — Z853 Personal history of malignant neoplasm of breast: Secondary | ICD-10-CM

## 2016-12-18 DIAGNOSIS — Z96653 Presence of artificial knee joint, bilateral: Secondary | ICD-10-CM | POA: Diagnosis present

## 2016-12-18 DIAGNOSIS — R404 Transient alteration of awareness: Secondary | ICD-10-CM | POA: Diagnosis not present

## 2016-12-18 DIAGNOSIS — I4510 Unspecified right bundle-branch block: Secondary | ICD-10-CM | POA: Diagnosis present

## 2016-12-18 DIAGNOSIS — Z8249 Family history of ischemic heart disease and other diseases of the circulatory system: Secondary | ICD-10-CM

## 2016-12-18 DIAGNOSIS — R739 Hyperglycemia, unspecified: Secondary | ICD-10-CM

## 2016-12-18 DIAGNOSIS — E1149 Type 2 diabetes mellitus with other diabetic neurological complication: Secondary | ICD-10-CM | POA: Diagnosis present

## 2016-12-18 DIAGNOSIS — E785 Hyperlipidemia, unspecified: Secondary | ICD-10-CM | POA: Diagnosis present

## 2016-12-18 DIAGNOSIS — Z7982 Long term (current) use of aspirin: Secondary | ICD-10-CM

## 2016-12-18 DIAGNOSIS — Z6835 Body mass index (BMI) 35.0-35.9, adult: Secondary | ICD-10-CM

## 2016-12-18 DIAGNOSIS — G4489 Other headache syndrome: Secondary | ICD-10-CM | POA: Diagnosis not present

## 2016-12-18 DIAGNOSIS — Z959 Presence of cardiac and vascular implant and graft, unspecified: Secondary | ICD-10-CM

## 2016-12-18 DIAGNOSIS — E0841 Diabetes mellitus due to underlying condition with diabetic mononeuropathy: Secondary | ICD-10-CM

## 2016-12-18 DIAGNOSIS — Z9012 Acquired absence of left breast and nipple: Secondary | ICD-10-CM

## 2016-12-18 DIAGNOSIS — Z8673 Personal history of transient ischemic attack (TIA), and cerebral infarction without residual deficits: Secondary | ICD-10-CM

## 2016-12-18 DIAGNOSIS — Z794 Long term (current) use of insulin: Secondary | ICD-10-CM

## 2016-12-18 DIAGNOSIS — Z79899 Other long term (current) drug therapy: Secondary | ICD-10-CM

## 2016-12-18 DIAGNOSIS — E1165 Type 2 diabetes mellitus with hyperglycemia: Secondary | ICD-10-CM | POA: Diagnosis present

## 2016-12-18 HISTORY — DX: Cerebral infarction, unspecified: I63.9

## 2016-12-18 HISTORY — DX: Malignant neoplasm of unspecified site of unspecified female breast: C50.919

## 2016-12-18 HISTORY — DX: Bifascicular block: I45.2

## 2016-12-18 HISTORY — DX: Calculus of kidney: N20.0

## 2016-12-18 LAB — URINALYSIS, ROUTINE W REFLEX MICROSCOPIC
BACTERIA UA: NONE SEEN
Bilirubin Urine: NEGATIVE
Glucose, UA: >=500 mg/dL — AB
Hgb urine dipstick: NEGATIVE
Ketones, ur: NEGATIVE mg/dL
Nitrite: NEGATIVE
Protein, ur: 100 mg/dL — AB
SPECIFIC GRAVITY, URINE: 1.023 (ref 1.005–1.030)
pH: 6 (ref 5.0–8.0)

## 2016-12-18 LAB — CBC
HCT: 37.7 % (ref 36.0–46.0)
Hemoglobin: 11.8 g/dL — ABNORMAL LOW (ref 12.0–15.0)
MCH: 27.9 pg (ref 26.0–34.0)
MCHC: 31.3 g/dL (ref 30.0–36.0)
MCV: 89.1 fL (ref 78.0–100.0)
PLATELETS: 263 10*3/uL (ref 150–400)
RBC: 4.23 MIL/uL (ref 3.87–5.11)
RDW: 15.2 % (ref 11.5–15.5)
WBC: 8 10*3/uL (ref 4.0–10.5)

## 2016-12-18 LAB — BASIC METABOLIC PANEL
ANION GAP: 13 (ref 5–15)
BUN: 21 mg/dL — AB (ref 6–20)
CALCIUM: 9.3 mg/dL (ref 8.9–10.3)
CO2: 22 mmol/L (ref 22–32)
CREATININE: 1.02 mg/dL — AB (ref 0.44–1.00)
Chloride: 102 mmol/L (ref 101–111)
GFR calc Af Amer: >60 mL/min (ref >60)
GFR, EST NON AFRICAN AMERICAN: 54 mL/min — AB (ref >60)
GLUCOSE: 339 mg/dL — AB (ref 65–99)
Potassium: 4.6 mmol/L (ref 3.5–5.1)
Sodium: 137 mmol/L (ref 135–145)

## 2016-12-18 LAB — I-STAT TROPONIN, ED: TROPONIN I, POC: 0 ng/mL (ref 0.00–0.08)

## 2016-12-18 MED ORDER — INSULIN ASPART 100 UNIT/ML ~~LOC~~ SOLN
0.0000 [IU] | Freq: Every day | SUBCUTANEOUS | Status: DC
  Administered 2016-12-19: 2 [IU] via SUBCUTANEOUS
  Administered 2016-12-19 – 2016-12-20 (×2): 3 [IU] via SUBCUTANEOUS

## 2016-12-18 MED ORDER — PANTOPRAZOLE SODIUM 40 MG PO TBEC
40.0000 mg | DELAYED_RELEASE_TABLET | Freq: Every day | ORAL | Status: DC
  Administered 2016-12-19 – 2016-12-22 (×4): 40 mg via ORAL
  Filled 2016-12-18 (×4): qty 1

## 2016-12-18 MED ORDER — EXEMESTANE 25 MG PO TABS
25.0000 mg | ORAL_TABLET | Freq: Every day | ORAL | Status: DC
  Administered 2016-12-19 – 2016-12-22 (×4): 25 mg via ORAL
  Filled 2016-12-18 (×4): qty 1

## 2016-12-18 MED ORDER — ATORVASTATIN CALCIUM 40 MG PO TABS
40.0000 mg | ORAL_TABLET | Freq: Every day | ORAL | Status: DC
  Administered 2016-12-19 – 2016-12-22 (×4): 40 mg via ORAL
  Filled 2016-12-18 (×4): qty 1

## 2016-12-18 MED ORDER — AMLODIPINE BESYLATE 5 MG PO TABS
2.5000 mg | ORAL_TABLET | Freq: Every day | ORAL | Status: DC
  Administered 2016-12-19: 2.5 mg via ORAL
  Filled 2016-12-18: qty 1

## 2016-12-18 MED ORDER — ACETAMINOPHEN 650 MG RE SUPP: 650.0000 mg | Freq: Four times a day (QID) | RECTAL | Status: DC | PRN

## 2016-12-18 MED ORDER — GABAPENTIN 300 MG PO CAPS
300.0000 mg | ORAL_CAPSULE | Freq: Three times a day (TID) | ORAL | Status: DC
  Administered 2016-12-19 – 2016-12-22 (×10): 300 mg via ORAL
  Filled 2016-12-18 (×11): qty 1

## 2016-12-18 MED ORDER — SODIUM CHLORIDE 0.9 % IV SOLN
INTRAVENOUS | Status: AC
  Administered 2016-12-19: via INTRAVENOUS

## 2016-12-18 MED ORDER — METFORMIN HCL 500 MG PO TABS
1000.0000 mg | ORAL_TABLET | Freq: Two times a day (BID) | ORAL | Status: DC
Start: 2016-12-21 — End: 2016-12-22
  Administered 2016-12-21 – 2016-12-22 (×4): 1000 mg via ORAL
  Filled 2016-12-18 (×4): qty 2

## 2016-12-18 MED ORDER — ASPIRIN EC 81 MG PO TBEC
81.0000 mg | DELAYED_RELEASE_TABLET | Freq: Every day | ORAL | Status: DC
  Administered 2016-12-19 – 2016-12-22 (×4): 81 mg via ORAL
  Filled 2016-12-18 (×4): qty 1

## 2016-12-18 MED ORDER — DEXTROSE 5 % IV SOLN
1.0000 g | INTRAVENOUS | Status: DC
  Administered 2016-12-19 (×2): 1 g via INTRAVENOUS
  Filled 2016-12-18 (×2): qty 10

## 2016-12-18 MED ORDER — ACETAMINOPHEN 325 MG PO TABS
650.0000 mg | ORAL_TABLET | Freq: Four times a day (QID) | ORAL | Status: DC | PRN
  Administered 2016-12-19 – 2016-12-22 (×7): 650 mg via ORAL
  Filled 2016-12-18 (×7): qty 2

## 2016-12-18 MED ORDER — SODIUM CHLORIDE 0.9% FLUSH
3.0000 mL | Freq: Two times a day (BID) | INTRAVENOUS | Status: DC
  Administered 2016-12-19 – 2016-12-22 (×7): 3 mL via INTRAVENOUS

## 2016-12-18 MED ORDER — POLYETHYLENE GLYCOL 3350 17 G PO PACK: 17.0000 g | PACK | Freq: Every day | ORAL | Status: DC | PRN

## 2016-12-18 MED ORDER — INSULIN ASPART 100 UNIT/ML ~~LOC~~ SOLN
0.0000 [IU] | Freq: Three times a day (TID) | SUBCUTANEOUS | Status: DC
  Administered 2016-12-19 (×2): 3 [IU] via SUBCUTANEOUS
  Administered 2016-12-20: 5 [IU] via SUBCUTANEOUS
  Administered 2016-12-20: 9 [IU] via SUBCUTANEOUS
  Administered 2016-12-20 – 2016-12-21 (×2): 7 [IU] via SUBCUTANEOUS

## 2016-12-18 MED ORDER — INSULIN GLARGINE 100 UNIT/ML ~~LOC~~ SOLN
60.0000 [IU] | Freq: Every day | SUBCUTANEOUS | Status: DC
  Administered 2016-12-19 – 2016-12-20 (×2): 60 [IU] via SUBCUTANEOUS
  Filled 2016-12-18 (×3): qty 0.6

## 2016-12-18 MED ORDER — FLUOXETINE HCL 20 MG PO CAPS
40.0000 mg | ORAL_CAPSULE | Freq: Every day | ORAL | Status: DC
  Administered 2016-12-19 – 2016-12-22 (×4): 40 mg via ORAL
  Filled 2016-12-18 (×5): qty 2

## 2016-12-18 NOTE — ED Triage Notes (Addendum)
Pt presents with 7/10 headache and 7/10 sharp CP that radiates down her left arm that increases in pain on palpation. EMS gave 324 ASA. Pt's EKG shows right BBB. Pt also reports having a syncopal episode that lasted about a minute with her friend in the passenger seat of car today at 1600. Denies injury. Pt's friend told her that she groaned and hollered during episode. Pt reports LOC. Pt also c/o numbness and tingling in left foot. Pt was admitted at [facility] last Monday for a syncopal episode fall.

## 2016-12-18 NOTE — ED Notes (Signed)
ED Provider at bedside. 

## 2016-12-18 NOTE — ED Notes (Signed)
Patient transported to MRI 

## 2016-12-18 NOTE — ED Provider Notes (Signed)
[facility] DEPT Provider Note   CSN: [id number] Arrival date & time: 12/18/16  1659     History   Chief Complaint Chief Complaint  Patient presents with  . Headache  . Loss of Consciousness  . Chest Pain    HPI Kristen [name] is a 71 y.o. female.  The history is provided by the patient.  Loss of Consciousness   Associated symptoms include chest pain and headaches. Pertinent negatives include abdominal pain, back pain, fever, palpitations, seizures and vomiting.  Chest Pain   Associated symptoms include headaches. Pertinent negatives include no abdominal pain, no back pain, no cough, no fever, no palpitations and no vomiting.  Pertinent negatives for past medical history include no seizures.  Illness  This is a recurrent problem. The current episode started 1 to 2 hours ago. The problem occurs rarely. The problem has been resolved. Associated symptoms include chest pain and headaches. Pertinent negatives include no abdominal pain. Nothing aggravates the symptoms. Nothing relieves the symptoms. She has tried nothing for the symptoms.    Past Medical History:  Diagnosis Date  . Allergy   . Anxiety   . Arthritis   . Blood transfusion without reported diagnosis   . Cancer ([place])    breast-left   . Chronic kidney disease    hx of kidney stones  . Coronary artery disease, non-occlusive    cath 2010 by Dr Lia Foyer revealed scattered nonobstrructive disease with preserved EF  . Depression   . Diabetes mellitus   . GERD (gastroesophageal reflux disease)   . Hemorrhoids   . Hyperlipidemia   . Hypertension   . Nephrolithiasis   . Obesity   . Overactive bladder   . Vitamin D deficiency     Patient Active Problem List   Diagnosis Date Noted  . Hyperglycemia 12/18/2016  . Acute lower UTI 12/12/2016  . Personal history of colonic polyps 02/12/2016  . Hyperkalemia 02/11/2015  . Nasal bone fractures 02/11/2015  . DM (diabetes mellitus), secondary uncontrolled ([place])  02/11/2015  . Syncope 02/11/2015  . AK (actinic keratosis) 10/21/2013  . Diabetic neuropathy ([place]) 07/24/2013  . Malignant neoplasm of upper-outer quadrant of left female breast ([place]) 11/30/2012  . Vitamin D deficiency 08/24/2012  . RBBB 02/20/2009  . Obesity 02/15/2009  . Anemia 02/15/2009  . DEPRESSION/ANXIETY 02/15/2009  . Essential hypertension 02/15/2009  . CAD (coronary artery disease) 02/15/2009  . GERD 02/15/2009  . Osteoarthritis 02/15/2009  . Hyperlipidemia 02/22/2007    Past Surgical History:  Procedure Laterality Date  . APPENDECTOMY    . BACK SURGERY    . BREAST SURGERY    . CARDIAC CATHETERIZATION  01/2009   cath by Dr Lia Foyer revealed nonobstructive CAD  . CESAREAN SECTION    . CHOLECYSTECTOMY    . CHOLECYSTECTOMY, LAPAROSCOPIC    . COLONOSCOPY     last 2012- with polyps  . JOINT REPLACEMENT    . LITHOTRIPSY    . MASTECTOMY W/ SENTINEL NODE BIOPSY Left 12/24/2012   Procedure: LEFT MASTECTOMY WITH LEFT SENTINEL LYMPH NODE BIOPSY;  Surgeon: Rolm Bookbinder, MD;  Location: [facility];  Service: General;  Laterality: Left;  . OOPHORECTOMY Right    1.5  ovaries rem  . PARTIAL HIP ARTHROPLASTY     left hip  . POLYPECTOMY    . REPLACEMENT TOTAL KNEE     both knees  . TONSILLECTOMY    . TOTAL MASTECTOMY Left 12/24/2012   Dr Donne Hazel    OB History    No data available  Home Medications    Prior to Admission medications   Medication Sig Start Date End Date Taking? Authorizing Provider  amLODipine (NORVASC) 2.5 MG tablet Take 1 tablet (2.5 mg total) by mouth daily. 07/22/16  Yes Ritchey, Modena Nunnery, MD  aspirin EC 81 MG tablet Take 81 mg by mouth daily.   Yes [provider]  atorvastatin (LIPITOR) 40 MG tablet TAKE 1 TABLET BY MOUTH DAILY AT 6 PM Patient taking differently: Take 40 mg by mouth at bedtime.  07/22/16  Yes [place], Modena Nunnery, MD  exemestane (AROMASIN) 25 MG tablet Take 1 tablet (25 mg total) by mouth daily after breakfast. 03/25/16  Yes  Nicholas Lose, MD  FLUoxetine (PROZAC) 40 MG capsule Take 1 capsule (40 mg total) by mouth daily. 07/22/16  Yes [place], Modena Nunnery, MD  gabapentin (NEURONTIN) 300 MG capsule Take 1 capsule (300 mg total) by mouth 3 (three) times daily. Patient taking differently: Take 300 mg by mouth 3 (three) times daily as needed (nerve pain).  07/22/16  Yes [place], Modena Nunnery, MD  insulin aspart (NOVOLOG FLEXPEN) 100 UNIT/ML FlexPen Inject 5- 50 units TID as directed with meals. Patient taking differently: Inject 8 Units into the skin 3 (three) times daily with meals.  07/22/16  Yes [place], Modena Nunnery, MD  insulin degludec (TRESIBA FLEXTOUCH) 100 UNIT/ML SOPN FlexTouch Pen Inject 0.6 mLs (60 Units total) into the skin daily at 10 pm. Patient taking differently: Inject 65 Units into the skin at bedtime.  07/22/16  Yes [place], Modena Nunnery, MD  metFORMIN (GLUCOPHAGE) 1000 MG tablet TAKE 1 TABLET BY MOUTH 2 TIMES DAILY WITH A MEAL. Patient taking differently: Take 1,000 mg by mouth 2 (two) times daily with a meal.  07/22/16  Yes [place], Modena Nunnery, MD  omeprazole (PRILOSEC) 20 MG capsule TAKE 1 CAPSULE BY MOUTH 2 TIMES DAILY. Patient taking differently: Take 20 mg by mouth 2 (two) times daily before a meal.  07/22/16  Yes [place], Modena Nunnery, MD  oxybutynin (DITROPAN-XL) 5 MG 24 hr tablet Take 1 tablet (5 mg total) by mouth at bedtime. 12/14/16  Yes Nita Sells, MD  polyethylene glycol (MIRALAX / GLYCOLAX) packet Take 17 g by mouth daily as needed for mild constipation. Patient taking differently: Take 17 g by mouth daily as needed for mild constipation. Mix in 8 oz liquid and drink 12/14/16  Yes Nita Sells, MD  blood glucose meter kit and supplies KIT Dispense based on patient and insurance preference. Use to monitor FSBS 3x daily. Dx: E11.41. 12/29/14   Alycia Rossetti, MD  glucose blood test strip Dispense based on patient and insurance preference. Use to monitor FSBS 3x daily. Dx: E11.41. 12/29/14   Alycia Rossetti,  MD  Lancets MISC Dispense based on patient and insurance preference. Use to monitor FSBS 3x daily. Dx: F02.77. 12/29/14   Alycia Rossetti, MD  UNIFINE PENTIPS 31G X 6 MM MISC USE WITH INSULIN FOUR TIMES DAILY 09/16/16   Alycia Rossetti, MD    Family History Family History  Problem Relation Age of Onset  . Cancer Mother        mouth cancer  . Breast cancer Mother        possibly dx in her 22s  . Lung cancer Brother 6  . Lung cancer Brother   . Lung cancer Brother   . Heart attack Father 79  . Breast cancer Sister   . Brain cancer Brother   . Colon cancer Neg Hx   .  Colon polyps Neg Hx   . Rectal cancer Neg Hx   . Stomach cancer Neg Hx   . Esophageal cancer Neg Hx     Social History Social History  Substance Use Topics  . Smoking status: Never Smoker  . Smokeless tobacco: Never Used  . Alcohol use No     Allergies   Sulfonamide derivatives   Review of Systems Review of Systems  Constitutional: Negative for chills and fever.  HENT: Negative for ear pain and sore throat.   Eyes: Negative for pain and visual disturbance.  Respiratory: Negative for cough, chest tightness and wheezing.   Cardiovascular: Positive for chest pain. Negative for palpitations.  Gastrointestinal: Negative for abdominal pain and vomiting.  Genitourinary: Negative for dysuria and hematuria.  Musculoskeletal: Negative for arthralgias and back pain.  Skin: Negative for color change and rash.  Neurological: Positive for syncope and headaches. Negative for seizures.  All other systems reviewed and are negative.    Physical Exam Updated Vital Signs BP 128/72   Pulse 63   Temp 99.4 F (37.4 C) (Oral)   Resp 16   Ht 5' 2"  (1.575 m)   Wt 89.4 kg (197 lb)   SpO2 97%   BMI 36.03 kg/m   Physical Exam  Constitutional: She is oriented to person, place, and time. She appears well-developed and well-nourished. No distress.  HENT:  Head: Normocephalic and atraumatic.  Eyes: Conjunctivae are  normal.  Neck: Normal range of motion. Neck supple.  Cardiovascular: Normal rate and regular rhythm.   Murmur (systolic) heard. Pulmonary/Chest: Effort normal and breath sounds normal. No respiratory distress.  Abdominal: Soft. There is no tenderness.  Musculoskeletal: She exhibits no edema.  Neurological: She is alert and oriented to person, place, and time. No cranial nerve deficit.  Skin: Skin is warm and dry.  Psychiatric: She has a normal mood and affect.  Nursing note and vitals reviewed.    ED Treatments / Results  Labs (all labs ordered are listed, but only abnormal results are displayed) Labs Reviewed  BASIC METABOLIC PANEL - Abnormal; Notable for the following:       Result Value   Glucose, Bld 339 (*)    BUN 21 (*)    Creatinine, Ser 1.02 (*)    GFR calc non Af Amer 54 (*)    All other components within normal limits  CBC - Abnormal; Notable for the following:    Hemoglobin 11.8 (*)    All other components within normal limits  URINALYSIS, ROUTINE W REFLEX MICROSCOPIC - Abnormal; Notable for the following:    Glucose, UA >=500 (*)    Protein, ur 100 (*)    Leukocytes, UA TRACE (*)    Squamous Epithelial / LPF 0-5 (*)    All other components within normal limits  URINE CULTURE  D-DIMER, QUANTITATIVE (NOT AT [facility])  COMPREHENSIVE METABOLIC PANEL  CBC  TROPONIN I  TROPONIN I  TROPONIN I  I-STAT TROPOININ, ED    EKG  EKG Interpretation None       Radiology Dg Chest 2 View  Result Date: 12/18/2016 CLINICAL DATA:  Syncopal episode EXAM: CHEST  2 VIEW COMPARISON:  12/12/2016 FINDINGS: No focal pulmonary infiltrate, consolidation or effusion. Borderline cardiomegaly. No pneumothorax. Calcified granuloma in the left upper lobe. Left axillary surgical clips. IMPRESSION: Borderline cardiomegaly.  No edema or infiltrate. Electronically Signed   By: Donavan Foil M.D.   On: 12/18/2016 18:06    Procedures Procedures (including critical care time)  Medications  Ordered in ED Medications  polyethylene glycol (MIRALAX / GLYCOLAX) packet 17 g (not administered)  amLODipine (NORVASC) tablet 2.5 mg (not administered)  atorvastatin (LIPITOR) tablet 40 mg (not administered)  FLUoxetine (PROZAC) capsule 40 mg (not administered)  gabapentin (NEURONTIN) capsule 300 mg (not administered)  insulin degludec (TRESIBA) 100 UNIT/ML FlexTouch Pen 60 Units (not administered)  metFORMIN (GLUCOPHAGE) tablet 1,000 mg (not administered)  pantoprazole (PROTONIX) EC tablet 40 mg (not administered)  exemestane (AROMASIN) tablet 25 mg (not administered)  aspirin EC tablet 81 mg (not administered)  sodium chloride flush (NS) 0.9 % injection 3 mL (not administered)  0.9 %  sodium chloride infusion (not administered)  acetaminophen (TYLENOL) tablet 650 mg (not administered)    Or  acetaminophen (TYLENOL) suppository 650 mg (not administered)  insulin aspart (novoLOG) injection 0-9 Units (not administered)  insulin aspart (novoLOG) injection 0-5 Units (not administered)  cefTRIAXone (ROCEPHIN) 1 g in dextrose 5 % 50 mL IVPB (not administered)     Initial Impression / Assessment and Plan / ED Course  I have reviewed the triage vital signs and the nursing notes.  Pertinent labs & imaging results that were available during my care of the patient were reviewed by me and considered in my medical decision making (see chart for details).     Kristen [name] is a 70 year old female coming in today after syncopal episode. Patient states she was walking to a friend's car from her house and when she sat down in the car she passed out. Denies any preceding symptoms such as nausea, vomiting, chest pain. Did endorse some mild shortness of breath earlier during the day. No seizure-like activity. Patient stated she had one similar episode last week whenever she got up and fell down. Workup at that time was unremarkable including a unremarkable active. Chest x-ray here shows borderline  cardiomegaly with no infiltrate. Labs otherwise unremarkable besides elevated glucose. CBC unremarkable. I-STAT troponin negative. Patient does endorse some mild chest pain that is reproducible on exam with palpation.  EKG similar to previous. Reassuring. Shows sinus rhythm with a ventricular rate of 73 bpm, PR interval 232 ms, slightly prolonged. QRS duration 152 ms, QTC 460 miliseconds. Lungs clear bilaterally. Cardiac exam reveals a systolic murmur. With all episodes of syncope of unknown origin, hospitalist was counseled and she will be admitted to the hospital for further evaluation and management of her syncope. Stable while under my care and stable at time of transfer.  Patient was seen with my attending, Dr. Jeneen Rinks, who voiced agreement and oversaw the evaluation and treatment of this patient.   [employer] seismologist was used in the creation of this note. If there are any errors or inconsistencies needing clarification, please contact me directly.     Final Clinical Impressions(s) / ED Diagnoses   Final diagnoses:  Syncope and collapse    New Prescriptions New Prescriptions   No medications on file     Valda Lamb, MD 12/18/16 2239    Tanna Furry, MD 12/28/16 [phone]

## 2016-12-18 NOTE — ED Notes (Signed)
Patient transported to X-ray 

## 2016-12-18 NOTE — H&P (Addendum)
TRH H&P   Patient Demographics:    Kristen [name], is a 71 y.o. female  MRN: [id number]   DOB - September 30, 1945  Admit Date - 12/18/2016  Outpatient Primary MD for the patient is Buelah Manis, Modena Nunnery, MD  Referring MD/NP/PA: Tanna Furry     Outpatient Specialists: Jenell Milliner (cardiologist), Nicholas Lose (oncologist) , Rolm Bookbinder (breast surgeon),    Patient coming from:    home  Chief Complaint  Patient presents with  . Headache  . Loss of Consciousness  . Chest Pain      HPI:    Kristen [name]  is a 71 y.o. female, w CAD, Hypertension, Hyperlipidemia, Dm2, apparently c/o syncope. Pt was sitting in the car seat, in her friends driveway.  She started leaning forward in the passenger seat, started moaning and then passed out.  Maybe a couple of minutes.  No seizure activity noted, no incontinence, no tongue lac.  Pt afterwards c/o slight left sided headache and chest pain.  Pt states left sided, radiation to the left arm.  Pt notes that she has had chest pain for the past 1 week.  It occurs intermittently.  Nothing appears to make it better or worse.    Pt was recently admitted on 7/2 for similar complaint of syncope. Trop negative.  Cardiac echo EF wnl.  Aortic sclerosis.    In ED,  Pt noted to have blood sugar 339.  CT brain 7/8=> no acute process.   EKG nsr at 73, LAD , nl int, RBBB, no significant change from 7/5 EKG.  Trop negative Mild Dehydration Bun/Creat 21/1.02  Pt will be admitted for w/up of syncope.     Review of systems:    In addition to the HPI above,  No Fever-chills, No Headache, No changes with Vision or hearing, No problems swallowing food or Liquids, No  Cough or Shortness of Breath, No Abdominal pain, No Nausea or Vommitting, Bowel movements are regular, No Blood in stool or Urine, No dysuria, No new skin rashes or bruises, No new joints  pains-aches,  No new weakness, tingling, numbness in any extremity, No recent weight gain or loss, No polyuria, polydypsia or polyphagia, No significant Mental Stressors.  A full 10 point Review of Systems was done, except as stated above, all other Review of Systems were negative.   With Past History of the following :    Past Medical History:  Diagnosis Date  . Allergy   . Anxiety   . Arthritis   . Blood transfusion without reported diagnosis   . Cancer ([place])    breast-left   . Chronic kidney disease    hx of kidney stones  . Coronary artery disease, non-occlusive    cath 2010 by Dr Lia Foyer revealed scattered nonobstrructive disease with preserved EF  . Depression   . Diabetes mellitus   . GERD (gastroesophageal reflux disease)   .  Hemorrhoids   . Hyperlipidemia   . Hypertension   . Nephrolithiasis   . Obesity   . Overactive bladder   . Vitamin D deficiency       Past Surgical History:  Procedure Laterality Date  . APPENDECTOMY    . BACK SURGERY    . BREAST SURGERY    . CARDIAC CATHETERIZATION  01/2009   cath by Dr Lia Foyer revealed nonobstructive CAD  . CESAREAN SECTION    . CHOLECYSTECTOMY    . CHOLECYSTECTOMY, LAPAROSCOPIC    . COLONOSCOPY     last 2012- with polyps  . JOINT REPLACEMENT    . LITHOTRIPSY    . MASTECTOMY W/ SENTINEL NODE BIOPSY Left 12/24/2012   Procedure: LEFT MASTECTOMY WITH LEFT SENTINEL LYMPH NODE BIOPSY;  Surgeon: Rolm Bookbinder, MD;  Location: [facility];  Service: General;  Laterality: Left;  . OOPHORECTOMY Right    1.5  ovaries rem  . PARTIAL HIP ARTHROPLASTY     left hip  . POLYPECTOMY    . REPLACEMENT TOTAL KNEE     both knees  . TONSILLECTOMY    . TOTAL MASTECTOMY Left 12/24/2012   Dr Donne Hazel      Social History:     Social History  Substance Use Topics  . Smoking status: Never Smoker  . Smokeless tobacco: Never Used  . Alcohol use No     Lives - at home by self  Mobility -  Walks with walker     Family  History :     Family History  Problem Relation Age of Onset  . Cancer Mother        mouth cancer  . Breast cancer Mother        possibly dx in her 44s  . Lung cancer Brother 45  . Lung cancer Brother   . Lung cancer Brother   . Heart attack Father 45  . Breast cancer Sister   . Brain cancer Brother   . Colon cancer Neg Hx   . Colon polyps Neg Hx   . Rectal cancer Neg Hx   . Stomach cancer Neg Hx   . Esophageal cancer Neg Hx       Home Medications:   Prior to Admission medications   Medication Sig Start Date End Date Taking? Authorizing Provider  amLODipine (NORVASC) 2.5 MG tablet Take 1 tablet (2.5 mg total) by mouth daily. 07/22/16  Yes [place], Modena Nunnery, MD  aspirin EC 81 MG tablet Take 81 mg by mouth daily.   Yes [provider]  atorvastatin (LIPITOR) 40 MG tablet TAKE 1 TABLET BY MOUTH DAILY AT 6 PM Patient taking differently: Take 40 mg by mouth at bedtime.  07/22/16  Yes [place], Modena Nunnery, MD  exemestane (AROMASIN) 25 MG tablet Take 1 tablet (25 mg total) by mouth daily after breakfast. 03/25/16  Yes Nicholas Lose, MD  FLUoxetine (PROZAC) 40 MG capsule Take 1 capsule (40 mg total) by mouth daily. 07/22/16  Yes [place], Modena Nunnery, MD  gabapentin (NEURONTIN) 300 MG capsule Take 1 capsule (300 mg total) by mouth 3 (three) times daily. Patient taking differently: Take 300 mg by mouth 3 (three) times daily as needed (nerve pain).  07/22/16  Yes [place], Modena Nunnery, MD  insulin aspart (NOVOLOG FLEXPEN) 100 UNIT/ML FlexPen Inject 5- 50 units TID as directed with meals. Patient taking differently: Inject 8 Units into the skin 3 (three) times daily with meals.  07/22/16  Yes , Modena Nunnery, MD  insulin degludec (TRESIBA  FLEXTOUCH) 100 UNIT/ML SOPN FlexTouch Pen Inject 0.6 mLs (60 Units total) into the skin daily at 10 pm. Patient taking differently: Inject 65 Units into the skin at bedtime.  07/22/16  Yes [place], Modena Nunnery, MD  metFORMIN (GLUCOPHAGE) 1000 MG tablet TAKE 1 TABLET BY  MOUTH 2 TIMES DAILY WITH A MEAL. Patient taking differently: Take 1,000 mg by mouth 2 (two) times daily with a meal.  07/22/16  Yes [place], Modena Nunnery, MD  omeprazole (PRILOSEC) 20 MG capsule TAKE 1 CAPSULE BY MOUTH 2 TIMES DAILY. Patient taking differently: Take 20 mg by mouth 2 (two) times daily before a meal.  07/22/16  Yes [place], Modena Nunnery, MD  oxybutynin (DITROPAN-XL) 5 MG 24 hr tablet Take 1 tablet (5 mg total) by mouth at bedtime. 12/14/16  Yes Nita Sells, MD  polyethylene glycol (MIRALAX / GLYCOLAX) packet Take 17 g by mouth daily as needed for mild constipation. Patient taking differently: Take 17 g by mouth daily as needed for mild constipation. Mix in 8 oz liquid and drink 12/14/16  Yes Nita Sells, MD  blood glucose meter kit and supplies KIT Dispense based on patient and insurance preference. Use to monitor FSBS 3x daily. Dx: E11.41. 12/29/14   Alycia Rossetti, MD  glucose blood test strip Dispense based on patient and insurance preference. Use to monitor FSBS 3x daily. Dx: E11.41. 12/29/14   Alycia Rossetti, MD  Lancets MISC Dispense based on patient and insurance preference. Use to monitor FSBS 3x daily. Dx: F81.01. 12/29/14   Alycia Rossetti, MD  UNIFINE PENTIPS 31G X 6 MM MISC USE WITH INSULIN FOUR TIMES DAILY 09/16/16   Alycia Rossetti, MD     Allergies:     Allergies  Allergen Reactions  . Sulfonamide Derivatives Hives     Physical Exam:   Vitals  Blood pressure 131/80, pulse 68, temperature 99.4 F (37.4 C), temperature source Oral, resp. rate 16, height _0  (1.575 m), weight 89.4 kg (197 lb), SpO2 96 %.   1. General  lying in bed in NAD,    2. Normal affect and insight, Not Suicidal or Homicidal, Awake Alert, Oriented X 3.  3. No F.N deficits, ALL C.Nerves Intact, Strength 5/5 all 4 extremities, Sensation intact all 4 extremities, Plantars down going.  4. Ears and Eyes appear Normal, Conjunctivae clear, PERRLA. Moist Oral Mucosa.  5. Supple  Neck, No JVD, No cervical lymphadenopathy appriciated, No Carotid Bruits.  6. Symmetrical Chest wall movement, Good air movement bilaterally, CTAB.  7. RRR, s1, s2, 2/6 sem rusb  ,  No Parasternal Heave.  8. Positive Bowel Sounds, Abdomen Soft, No tenderness, No organomegaly appriciated,No rebound -guarding or rigidity.  9.  No Cyanosis, Normal Skin Turgor, No Skin Rash or Bruise.  10. Good muscle tone,  joints appear normal , no effusions, Normal ROM.  11. No Palpable Lymph Nodes in Neck or Axillae     Data Review:    CBC  Recent Labs Lab 12/12/16 2111 12/13/16 0514 12/18/16 1729  WBC 9.1 7.7 8.0  HGB 12.0 11.1* 11.8*  HCT 37.9 35.9* 37.7  PLT 262 230 263  MCV 86.1 87.1 89.1  MCH 27.3 26.9 27.9  MCHC 31.7 30.9 31.3  RDW 14.5 15.0 15.2  LYMPHSABS 2.3  --   --   MONOABS 0.7  --   --   EOSABS 0.4  --   --   BASOSABS 0.0  --   --    ------------------------------------------------------------------------------------------------------------------  Chemistries  Recent Labs Lab 12/12/16 2111 12/13/16 0514 12/18/16 1729  NA 140 140 137  K 4.7 4.4 4.6  CL 102 104 102  CO2 _0 GLUCOSE 234* 191* 339*  BUN 17 20 21*  CREATININE 1.01* 0.86 1.02*  CALCIUM 9.4 8.7* 9.3  AST 20  --   --   ALT 24  --   --   ALKPHOS 96  --   --   BILITOT 0.6  --   --    ------------------------------------------------------------------------------------------------------------------ estimated creatinine clearance is 52.5 mL/min (A) (by C-G formula based on SCr of 1.02 mg/dL (H)). ------------------------------------------------------------------------------------------------------------------ No results for input(s): TSH, T4TOTAL, T3FREE, THYROIDAB in the last 72 hours.  Invalid input(s): FREET3  Coagulation profile No results for input(s): INR, PROTIME in the last 168  hours. ------------------------------------------------------------------------------------------------------------------- No results for input(s): DDIMER in the last 72 hours. -------------------------------------------------------------------------------------------------------------------  Cardiac Enzymes No results for input(s): CKMB, TROPONINI, MYOGLOBIN in the last 168 hours.  Invalid input(s): CK ------------------------------------------------------------------------------------------------------------------ No results found for: BNP   ---------------------------------------------------------------------------------------------------------------  Urinalysis    Component Value Date/Time   COLORURINE YELLOW 12/18/2016 1852   APPEARANCEUR CLEAR 12/18/2016 1852   LABSPEC 1.023 12/18/2016 1852   PHURINE 6.0 12/18/2016 1852   GLUCOSEU >=500 (A) 12/18/2016 1852   HGBUR NEGATIVE 12/18/2016 1852   BILIRUBINUR NEGATIVE 12/18/2016 1852   KETONESUR NEGATIVE 12/18/2016 1852   PROTEINUR 100 (A) 12/18/2016 1852   UROBILINOGEN 0.2 02/10/2015 1820   NITRITE NEGATIVE 12/18/2016 1852   LEUKOCYTESUR TRACE (A) 12/18/2016 1852    ----------------------------------------------------------------------------------------------------------------   Imaging Results:    Dg Chest 2 View  Result Date: 12/18/2016 CLINICAL DATA:  Syncopal episode EXAM: CHEST  2 VIEW COMPARISON:  12/12/2016 FINDINGS: No focal pulmonary infiltrate, consolidation or effusion. Borderline cardiomegaly. No pneumothorax. Calcified granuloma in the left upper lobe. Left axillary surgical clips. IMPRESSION: Borderline cardiomegaly.  No edema or infiltrate. Electronically Signed   By: Donavan Foil M.D.   On: 12/18/2016 18:06       Assessment & Plan:    Active Problems:   DM (diabetes mellitus), secondary uncontrolled (HCC)   Syncope   Hyperglycemia    Syncope Tele Trop I q6h x3 D dimer ,  If positive then CTA  chest MRI brain noncontrast EEG Check carotid ultrasound Cardiac echo 12/13/2016 Check orthostatic bp Recommend outpatient evaluation for OSA  Cp Tele  Trop I q6h x3 Cardiology emailed for consult in am NPO after MN Defer to cardiology regarding w/up.   Dehydration as evident by Bun>20 Hydrate with ns iv  Uti Rocephin 1gm iv qday Await urine culture  Dm2 uncontrolled.  Hga1c =9.8 (11/01/2016) fsbs ac and qhs,  ISS Hold Levemir, Hold Metformin  Diabetic neuropathy Cont gabapentin  Hx of breast cancer Continue aromasin  Anemia Repeat cbc in am  CAD  Cont aspirin , Lipitor, amlodipine  DVT Lovenox - SCDs   AM Labs Ordered, also please review Full Orders  Family Communication: Admission, patients condition and plan of care including tests being ordered have been discussed with the patient and daughter Arbie Cookey)  who indicate understanding and agree with the plan and Code Status.  Code Status FULL CODE  Likely DC to   home  Condition GUARDED    Consults called:   Admission status: observation  Time spent in minutes : 45 minutes   Jani Gravel M.D on 12/18/2016 at 8:58 PM  Between 7am to 7pm - Pager - [phone] After 7pm go to [email or web address] - password [email or web address]  [facility] - Office  [phone]

## 2016-12-18 NOTE — ED Notes (Signed)
Pt placed on bedside commode. Pt reports feeling dizzy upon standing up.

## 2016-12-19 ENCOUNTER — Observation Stay (HOSPITAL_COMMUNITY): Payer: Medicare Other

## 2016-12-19 ENCOUNTER — Encounter (HOSPITAL_COMMUNITY): Payer: Self-pay | Admitting: Physician Assistant

## 2016-12-19 ENCOUNTER — Inpatient Hospital Stay (HOSPITAL_COMMUNITY)
Admission: EM | Disposition: A | Discharge status: Skilled Nursing Facility | Payer: Self-pay | Source: Home / Self Care | Attending: Internal Medicine

## 2016-12-19 DIAGNOSIS — Z5189 Encounter for other specified aftercare: Secondary | ICD-10-CM | POA: Diagnosis not present

## 2016-12-19 DIAGNOSIS — I453 Trifascicular block: Secondary | ICD-10-CM | POA: Diagnosis not present

## 2016-12-19 DIAGNOSIS — E1140 Type 2 diabetes mellitus with diabetic neuropathy, unspecified: Secondary | ICD-10-CM | POA: Diagnosis present

## 2016-12-19 DIAGNOSIS — Z79811 Long term (current) use of aromatase inhibitors: Secondary | ICD-10-CM | POA: Diagnosis not present

## 2016-12-19 DIAGNOSIS — D649 Anemia, unspecified: Secondary | ICD-10-CM | POA: Diagnosis present

## 2016-12-19 DIAGNOSIS — I10 Essential (primary) hypertension: Secondary | ICD-10-CM | POA: Diagnosis present

## 2016-12-19 DIAGNOSIS — Z96653 Presence of artificial knee joint, bilateral: Secondary | ICD-10-CM | POA: Diagnosis present

## 2016-12-19 DIAGNOSIS — Z9012 Acquired absence of left breast and nipple: Secondary | ICD-10-CM | POA: Diagnosis not present

## 2016-12-19 DIAGNOSIS — Z794 Long term (current) use of insulin: Secondary | ICD-10-CM | POA: Diagnosis not present

## 2016-12-19 DIAGNOSIS — R739 Hyperglycemia, unspecified: Secondary | ICD-10-CM | POA: Diagnosis not present

## 2016-12-19 DIAGNOSIS — Z79899 Other long term (current) drug therapy: Secondary | ICD-10-CM | POA: Diagnosis not present

## 2016-12-19 DIAGNOSIS — Z882 Allergy status to sulfonamides status: Secondary | ICD-10-CM | POA: Diagnosis not present

## 2016-12-19 DIAGNOSIS — M6281 Muscle weakness (generalized): Secondary | ICD-10-CM | POA: Diagnosis not present

## 2016-12-19 DIAGNOSIS — I2510 Atherosclerotic heart disease of native coronary artery without angina pectoris: Secondary | ICD-10-CM | POA: Diagnosis present

## 2016-12-19 DIAGNOSIS — E0841 Diabetes mellitus due to underlying condition with diabetic mononeuropathy: Secondary | ICD-10-CM | POA: Diagnosis not present

## 2016-12-19 DIAGNOSIS — Z4682 Encounter for fitting and adjustment of non-vascular catheter: Secondary | ICD-10-CM | POA: Diagnosis not present

## 2016-12-19 DIAGNOSIS — Z853 Personal history of malignant neoplasm of breast: Secondary | ICD-10-CM | POA: Diagnosis not present

## 2016-12-19 DIAGNOSIS — Z8673 Personal history of transient ischemic attack (TIA), and cerebral infarction without residual deficits: Secondary | ICD-10-CM | POA: Diagnosis not present

## 2016-12-19 DIAGNOSIS — E669 Obesity, unspecified: Secondary | ICD-10-CM | POA: Diagnosis present

## 2016-12-19 DIAGNOSIS — Z741 Need for assistance with personal care: Secondary | ICD-10-CM | POA: Diagnosis not present

## 2016-12-19 DIAGNOSIS — E1165 Type 2 diabetes mellitus with hyperglycemia: Secondary | ICD-10-CM | POA: Diagnosis present

## 2016-12-19 DIAGNOSIS — R55 Syncope and collapse: Secondary | ICD-10-CM | POA: Diagnosis not present

## 2016-12-19 DIAGNOSIS — I4510 Unspecified right bundle-branch block: Secondary | ICD-10-CM | POA: Diagnosis present

## 2016-12-19 DIAGNOSIS — Z803 Family history of malignant neoplasm of breast: Secondary | ICD-10-CM | POA: Diagnosis not present

## 2016-12-19 DIAGNOSIS — I452 Bifascicular block: Secondary | ICD-10-CM | POA: Diagnosis not present

## 2016-12-19 DIAGNOSIS — R2681 Unsteadiness on feet: Secondary | ICD-10-CM | POA: Diagnosis not present

## 2016-12-19 DIAGNOSIS — I499 Cardiac arrhythmia, unspecified: Secondary | ICD-10-CM | POA: Diagnosis not present

## 2016-12-19 DIAGNOSIS — E860 Dehydration: Secondary | ICD-10-CM | POA: Diagnosis present

## 2016-12-19 DIAGNOSIS — Z96642 Presence of left artificial hip joint: Secondary | ICD-10-CM | POA: Diagnosis present

## 2016-12-19 DIAGNOSIS — E785 Hyperlipidemia, unspecified: Secondary | ICD-10-CM | POA: Diagnosis present

## 2016-12-19 DIAGNOSIS — Z6835 Body mass index (BMI) 35.0-35.9, adult: Secondary | ICD-10-CM | POA: Diagnosis not present

## 2016-12-19 DIAGNOSIS — E1365 Other specified diabetes mellitus with hyperglycemia: Secondary | ICD-10-CM | POA: Diagnosis not present

## 2016-12-19 DIAGNOSIS — Z959 Presence of cardiac and vascular implant and graft, unspecified: Secondary | ICD-10-CM | POA: Diagnosis not present

## 2016-12-19 DIAGNOSIS — Z8249 Family history of ischemic heart disease and other diseases of the circulatory system: Secondary | ICD-10-CM | POA: Diagnosis not present

## 2016-12-19 DIAGNOSIS — Z7982 Long term (current) use of aspirin: Secondary | ICD-10-CM | POA: Diagnosis not present

## 2016-12-19 HISTORY — PX: PACEMAKER IMPLANT: EP1218

## 2016-12-19 LAB — TSH: TSH: 0.877 u[IU]/mL (ref 0.350–4.500)

## 2016-12-19 LAB — CBC
HEMATOCRIT: 37.8 % (ref 36.0–46.0)
Hemoglobin: 11.9 g/dL — ABNORMAL LOW (ref 12.0–15.0)
MCH: 27.8 pg (ref 26.0–34.0)
MCHC: 31.5 g/dL (ref 30.0–36.0)
MCV: 88.3 fL (ref 78.0–100.0)
PLATELETS: 252 10*3/uL (ref 150–400)
RBC: 4.28 MIL/uL (ref 3.87–5.11)
RDW: 15.1 % (ref 11.5–15.5)
WBC: 7.3 10*3/uL (ref 4.0–10.5)

## 2016-12-19 LAB — COMPREHENSIVE METABOLIC PANEL
ALT: 36 U/L (ref 14–54)
AST: 23 U/L (ref 15–41)
Albumin: 3.4 g/dL — ABNORMAL LOW (ref 3.5–5.0)
Alkaline Phosphatase: 69 U/L (ref 38–126)
Anion gap: 9 (ref 5–15)
BUN: 18 mg/dL (ref 6–20)
CHLORIDE: 104 mmol/L (ref 101–111)
CO2: 27 mmol/L (ref 22–32)
CREATININE: 0.85 mg/dL (ref 0.44–1.00)
Calcium: 9.1 mg/dL (ref 8.9–10.3)
GFR calc Af Amer: >60 mL/min (ref >60)
GFR calc non Af Amer: >60 mL/min (ref >60)
Glucose, Bld: 213 mg/dL — ABNORMAL HIGH (ref 65–99)
POTASSIUM: 3.8 mmol/L (ref 3.5–5.1)
SODIUM: 140 mmol/L (ref 135–145)
Total Bilirubin: 0.5 mg/dL (ref 0.3–1.2)
Total Protein: 6.7 g/dL (ref 6.5–8.1)

## 2016-12-19 LAB — TROPONIN I
Troponin I: <0.03 ng/mL (ref <0.03)
Troponin I: <0.03 ng/mL (ref <0.03)

## 2016-12-19 LAB — GLUCOSE, CAPILLARY
GLUCOSE-CAPILLARY: 223 mg/dL — AB (ref 65–99)
GLUCOSE-CAPILLARY: 224 mg/dL — AB (ref 65–99)
GLUCOSE-CAPILLARY: 233 mg/dL — AB (ref 65–99)
GLUCOSE-CAPILLARY: 294 mg/dL — AB (ref 65–99)
Glucose-Capillary: 180 mg/dL — ABNORMAL HIGH (ref 65–99)
Glucose-Capillary: 184 mg/dL — ABNORMAL HIGH (ref 65–99)

## 2016-12-19 LAB — D-DIMER, QUANTITATIVE: D-Dimer, Quant: <0.27 ug/mL-FEU (ref 0.00–0.50)

## 2016-12-19 LAB — SURGICAL PCR SCREEN
MRSA, PCR: NEGATIVE
Staphylococcus aureus: POSITIVE — AB

## 2016-12-19 SURGERY — PACEMAKER IMPLANT
Anesthesia: LOCAL

## 2016-12-19 MED ORDER — CHLORHEXIDINE GLUCONATE 4 % EX LIQD
CUTANEOUS | Status: AC
  Administered 2016-12-19: 15:00:00
  Filled 2016-12-19: qty 15

## 2016-12-19 MED ORDER — CHLORHEXIDINE GLUCONATE 4 % EX LIQD: 60.0000 mL | Freq: Once | CUTANEOUS | Status: DC

## 2016-12-19 MED ORDER — LIDOCAINE HCL (PF) 1 % IJ SOLN
INTRAMUSCULAR | Status: DC | PRN
  Administered 2016-12-19: 45 mL via INTRADERMAL

## 2016-12-19 MED ORDER — CEFAZOLIN SODIUM-DEXTROSE 2-4 GM/100ML-% IV SOLN
INTRAVENOUS | Status: AC
  Filled 2016-12-19: qty 100

## 2016-12-19 MED ORDER — LIDOCAINE HCL (PF) 1 % IJ SOLN
INTRAMUSCULAR | Status: AC
  Filled 2016-12-19: qty 60

## 2016-12-19 MED ORDER — SODIUM CHLORIDE 0.9 % IR SOLN
80.0000 mg | Status: AC
  Administered 2016-12-19: 80 mg
  Filled 2016-12-19: qty 2

## 2016-12-19 MED ORDER — SODIUM CHLORIDE 0.9 % IV SOLN: INTRAVENOUS | Status: DC

## 2016-12-19 MED ORDER — CEFAZOLIN SODIUM-DEXTROSE 2-4 GM/100ML-% IV SOLN
2.0000 g | INTRAVENOUS | Status: AC
  Administered 2016-12-19: 2 g via INTRAVENOUS

## 2016-12-19 MED ORDER — HEPARIN (PORCINE) IN NACL 2-0.9 UNIT/ML-% IJ SOLN
INTRAMUSCULAR | Status: AC | PRN
  Administered 2016-12-19: 500 mL

## 2016-12-19 MED ORDER — CHLORHEXIDINE GLUCONATE 4 % EX LIQD
CUTANEOUS | Status: AC
  Filled 2016-12-19: qty 15

## 2016-12-19 MED ORDER — MIDAZOLAM HCL 5 MG/5ML IJ SOLN
INTRAMUSCULAR | Status: AC
  Filled 2016-12-19: qty 5

## 2016-12-19 MED ORDER — FENTANYL CITRATE (PF) 100 MCG/2ML IJ SOLN
INTRAMUSCULAR | Status: AC
  Filled 2016-12-19: qty 2

## 2016-12-19 MED ORDER — MIDAZOLAM HCL 5 MG/5ML IJ SOLN
INTRAMUSCULAR | Status: DC | PRN
  Administered 2016-12-19: 2 mg via INTRAVENOUS
  Administered 2016-12-19: 1 mg via INTRAVENOUS

## 2016-12-19 MED ORDER — ACETAMINOPHEN 325 MG PO TABS: 325.0000 mg | ORAL_TABLET | ORAL | Status: DC | PRN

## 2016-12-19 MED ORDER — CEFAZOLIN SODIUM-DEXTROSE 1-4 GM/50ML-% IV SOLN: 1.0000 g | Freq: Four times a day (QID) | INTRAVENOUS | Status: DC

## 2016-12-19 MED ORDER — ONDANSETRON HCL 4 MG/2ML IJ SOLN: 4.0000 mg | Freq: Four times a day (QID) | INTRAMUSCULAR | Status: DC | PRN

## 2016-12-19 MED ORDER — HEPARIN (PORCINE) IN NACL 2-0.9 UNIT/ML-% IJ SOLN
INTRAMUSCULAR | Status: AC
  Filled 2016-12-19: qty 500

## 2016-12-19 MED ORDER — FENTANYL CITRATE (PF) 100 MCG/2ML IJ SOLN
INTRAMUSCULAR | Status: DC | PRN
  Administered 2016-12-19: 50 ug via INTRAVENOUS
  Administered 2016-12-19: 12.5 ug via INTRAVENOUS

## 2016-12-19 MED ORDER — AMLODIPINE BESYLATE 5 MG PO TABS
5.0000 mg | ORAL_TABLET | Freq: Every day | ORAL | Status: DC
  Administered 2016-12-20 – 2016-12-22 (×3): 5 mg via ORAL
  Filled 2016-12-19 (×3): qty 1

## 2016-12-19 MED ORDER — SODIUM CHLORIDE 0.9 % IV SOLN: INTRAVENOUS | Status: AC

## 2016-12-19 SURGICAL SUPPLY — 7 items
CABLE SURGICAL S-101-97-12 (CABLE) ×1 IMPLANT
LEAD CAPSURE NOVUS 45CM (Lead) ×1 IMPLANT
LEAD CAPSURE NOVUS 5076-52CM (Lead) ×1 IMPLANT
PACEMAKER EDORA 8DR-T MRI (Pacemaker) ×1 IMPLANT
PAD DEFIB LIFELINK (PAD) ×1 IMPLANT
SHEATH CLASSIC 7F (SHEATH) ×2 IMPLANT
TRAY PACEMAKER INSERTION (PACKS) ×1 IMPLANT

## 2016-12-19 NOTE — Progress Notes (Signed)
Chaplain visited patient via consult.  Patient doesn't remember requesting an Advanced Directive but was happy to take the paperwork and look it over and make a decision.  Patient says her daughter, who is at bedside, usually makes decisions for her.  Sister is also present in the room.  Chaplain explained to patient that AD's are something we provide as a service while the patient is in the hospital.  Patient didn't seem to want to get it completed at this time but we are available should she change her mind.    12/19/16 1018  Clinical Encounter Type  Visited With Patient and family together  Visit Type Initial;Social support  Referral From Nurse  Consult/Referral To Chaplain

## 2016-12-19 NOTE — Plan of Care (Signed)
Problem: Safety: Goal: Ability to remain free from injury will improve Outcome: Progressing Pt instructed  to stay in bed and use the call light or the white board and phone with numbers for RN/NT and that I would put her bed alarm on at night to help her to remember and she was fine with that.

## 2016-12-19 NOTE — Care Management Obs Status (Signed)
[place] NOTIFICATION   Patient Details  Name: Kristen [name] MRN: [id number] Date of Birth: 1946-02-10   Medicare Observation Status Notification Given:  Yes    Bethena Roys, RN 12/19/2016, 10:33 AM

## 2016-12-19 NOTE — Consult Note (Signed)
Cardiology Consultation:   Patient ID: Kristen [name]; [id number]; 02-23-1946   Admit date: 12/18/2016 Date of Consult: 12/19/2016  Primary Care Provider: Alycia Rossetti, MD Primary Cardiologist: Remotely seen by Dr. Verl Blalock and Dr. Rayann Heman for nonobstructive CAD   Patient Profile:   Kristen [name] is a 71 y.o. female with a hx of Breast CA, scattered mild-moderate nonobstructive CAD by cath 2010, bifascicular block by EKG, DM (last A1C 9.8)., HTN, obesity, HLD, anxiety/depression, arthritis, incidental cerebellar infarcts by MRI this admission whom we asked to see by Dr. Maudie Mercury for syncope. Chief complaint: Passed out.  History of Present Illness:   Prior history includes left heart cath 01/2009 showing 40% LAD, 40% D2, 50% distal LAD, 20-30% AV cx, 20-30% luminal Cx, 30% prox RCA, 40-50% mRCA, LVEF normal. Nuc 12/2012 was intermediate risk - defect of the apex that appears to be mostly due to breast attenuation but also a very small area of reversible ischemia, EF normal at 74% - this was done for cardiac clearance for breast surgery and telephone notes indicate she had been cleared based on this study.  She has prior admission in 2016 for syncope resulting in nasal bone fracture - this occurred while standing up from the commode felt possibly 2/2 orthostasis versus micurition syncope, prompting a switch from lisinopril to amlodipine (also with hyperkalemia). She was recently admitted 7/2-12/14/16 with episode of syncope. The notes indicate this occurred while getting up from seat to go the stove to cook, but the patient reports she had been standing at the stove for about 3-4 minutes before she passed out without warning. Pan scan without acute injury. Orthostatics were borderline positive during one of the multiple checks but does not appear BP fell below 100 at anytime. She also was felt to have asymptomatic bacteruria. No formal changes made to antihypertensive regimen but note to consider changing  amlodipine to clonidine due to concern for POTS. 2D Echo 12/13/16 showed moderate LVH, EF 55%, grade 1 DD, mild TR, no sig change from prior. Rounding notes indicate tele showing NSR with PVCs.  She presented back to [facility] with an episode of syncope that happened yesterday in the car. She was a seated passenger (no longer drives) when apparently her friend told her she fell forward, hollering "I'm going down" (or something of this nature, patient's not sure), and passed out. She's unsure how long she was out for. No apparent injury, remained seated the whole time. Denies any preceding CP, palpitations, SOB. Came to fairly quickly without any B/B incontinence or known seizure activity. She's felt fine since except she does report ever since 2010 she's had random chest pains that come and go without particular pattern, sometimes once a week, sometimes all day long. She will take a baby aspirin and go lay down with eventual resolution. She denies any particular change since 2010. She does not seem particularly concerned about them and is somewhat of a difficult historian giving further details - very nonchalant. They occur mostly at rest, without association with inspiration, palpation or exertion. On Saturday 12/17/16 this lasted on/off all day. Labs show neg troponin x 3, glucose 339 with glycosuria, d-dimer neg, Hgb 11.9 (similar to 11-12 range previously). MRI brain nonacute, remote left cerebellar infarcts. EKG shows NSR with RBBB/LAFB similar to prior.   Past Medical History:  Diagnosis Date  . Allergy   . Anxiety   . Arthritis   . Bifascicular block   . Blood transfusion without reported diagnosis   .  Breast cancer ([place])    breast-left   . Coronary artery disease, non-occlusive    a. Left heart cath 01/2009 showed 40% LAD, 40% D2, 50% distal LAD, 20-30% AV cx, 20-30% luminal Cx, 30% prox RCA, 40-50% mRCA, LVEF normal. b. Intermediate risk nuc 2014 - ? breast attn versus small area of reversible ischemia,  EF 74%.  . Depression   . Diabetes mellitus   . GERD (gastroesophageal reflux disease)   . Hemorrhoids   . Hyperlipidemia   . Hypertension   . Nephrolithiasis   . Obesity   . Overactive bladder   . Stroke [facility])    a. MRI 12/2016 - remote left cerebellar infarcts incidentally noted.  . Vitamin D deficiency     Past Surgical History:  Procedure Laterality Date  . APPENDECTOMY    . BACK SURGERY    . BREAST SURGERY    . CARDIAC CATHETERIZATION  01/2009   cath by Dr Lia Foyer revealed nonobstructive CAD  . CESAREAN SECTION    . CHOLECYSTECTOMY    . CHOLECYSTECTOMY, LAPAROSCOPIC    . COLONOSCOPY     last 2012- with polyps  . JOINT REPLACEMENT    . LITHOTRIPSY    . MASTECTOMY W/ SENTINEL NODE BIOPSY Left 12/24/2012   Procedure: LEFT MASTECTOMY WITH LEFT SENTINEL LYMPH NODE BIOPSY;  Surgeon: Rolm Bookbinder, MD;  Location: [facility];  Service: General;  Laterality: Left;  . OOPHORECTOMY Right    1.5  ovaries rem  . PARTIAL HIP ARTHROPLASTY     left hip  . POLYPECTOMY    . REPLACEMENT TOTAL KNEE     both knees  . TONSILLECTOMY    . TOTAL MASTECTOMY Left 12/24/2012   Dr Donne Hazel     Inpatient Medications: Scheduled Meds: . amLODipine  2.5 mg Oral Daily  . aspirin EC  81 mg Oral Daily  . atorvastatin  40 mg Oral q1800  . exemestane  25 mg Oral QPC breakfast  . FLUoxetine  40 mg Oral Daily  . gabapentin  300 mg Oral TID  . insulin aspart  0-5 Units Subcutaneous QHS  . insulin aspart  0-9 Units Subcutaneous TID WC  . insulin glargine  60 Units Subcutaneous Q2200  . [START ON 12/21/2016] metFORMIN  1,000 mg Oral BID WC  . pantoprazole  40 mg Oral Daily  . sodium chloride flush  3 mL Intravenous Q12H   Continuous Infusions: . sodium chloride 50 mL/hr at 12/19/16 0009  . cefTRIAXone (ROCEPHIN)  IV Stopped (12/19/16 0107)   PRN Meds: acetaminophen **OR** acetaminophen, polyethylene glycol  Allergies:    Allergies  Allergen Reactions  . Sulfonamide Derivatives Hives     Social History:   Social History   Social History  . Marital status: Widowed    Spouse name: N/A  . Number of children: 2  . Years of education: N/A   Occupational History  . Not on file.   Social History Main Topics  . Smoking status: Never Smoker  . Smokeless tobacco: Never Used  . Alcohol use No  . Drug use: No  . Sexual activity: Not on file   Other Topics Concern  . Not on file   Social History Narrative   Lives in [place] with daughter.   Retired    Family History:   The patient's family history includes Brain cancer in her brother; Breast cancer in her mother and sister; Cancer in her mother; Heart attack (age of onset: 55) in her father; Lung cancer in her  brother and brother; Lung cancer (age of onset: 41) in her brother. There is no history of Colon cancer, Colon polyps, Rectal cancer, Stomach cancer, or Esophageal cancer.  ROS:  Please see the history of present illness.  All other ROS reviewed and negative.     Physical Exam/Data:   Vitals:   12/18/16 2030 12/18/16 2145 12/18/16 2300 12/18/16 2343  BP: 131/80 128/72  131/67  Pulse: 68 63  66  Resp: 16 16  16   Temp:    97.8 F (36.6 C)  TempSrc:    Oral  SpO2: 96% 97%    Weight:   195 lb 11.2 oz (88.8 kg)   Height:   5\' 2"  (1.575 m)     Intake/Output Summary (Last 24 hours) at 12/19/16 0735 Last data filed at 12/19/16 0600  Gross per 24 hour  Intake              350 ml  Output              300 ml  Net               50 ml   Filed Weights   12/18/16 1718 12/18/16 2300  Weight: 197 lb (89.4 kg) 195 lb 11.2 oz (88.8 kg)   Body mass index is 35.79 kg/m.  General: Well developed, well nourished obese WF, in no acute distress. Head: Normocephalic, atraumatic, sclera non-icteric, no xanthomas, nares are without discharge. Neck: Negative for carotid bruits. JVD not elevated. Lungs: Clear bilaterally to auscultation without wheezes, rales, or rhonchi. Breathing is unlabored. Heart: RRR with  S1 S2. 1/6 systolic left lower sternal border murmur, no rubs, or gallops appreciated. Abdomen: Soft, non-tender, non-distended with normoactive bowel sounds. No hepatomegaly. No rebound/guarding. No obvious abdominal masses. Overweight Msk:  Strength and tone appear normal for age. Extremities: No clubbing or cyanosis. No edema.  Distal pedal pulses are 2+ and equal bilaterally. Neuro: Alert and oriented X 3. No facial asymmetry. No focal deficit. Moves all extremities spontaneously. Psych:  Responds to questions appropriately with a normal affect.   EKG:  The EKG was personally reviewed and demonstrates NSR 73bpm, slightly prolonged PR interval, RBBB, LAFB similar to prior. Bifascicular block  Relevant CV Studies:  Echocardiogram 12/13/16: - Left ventricle: The cavity size was normal. Wall thickness was   increased in a pattern of moderate LVH. Systolic function was   normal. The estimated ejection fraction was in the range of 55%   to 60%. Normal GLPSS at -21%. Wall motion was normal; there were   no regional wall motion abnormalities. Doppler parameters are   consistent with abnormal left ventricular relaxation (grade 1   diastolic dysfunction). The E/e&' ratio is between 8-15,   suggesting indeterminate LV filling pressure. - Aortic valve: Trileaflet. Sclerosis without stenosis. There was   no regurgitation. - Tricuspid valve: There was mild regurgitation. - Pulmonary arteries: PA peak pressure: 34 mm Hg (S). - Inferior vena cava: The vessel was normal in size. The   respirophasic diameter changes were in the normal range (>= 50%),   consistent with normal central venous pressure.  Impressions:  - Compared to a prior echo in 2016, there are no significant   changes.  Laboratory Data:  Chemistry Recent Labs Lab 12/13/16 0514 12/18/16 1729 12/19/16 0418  NA 140 137 140  K 4.4 4.6 3.8  CL 104 102 104  CO2 28 22 27   GLUCOSE 191* 339* 213*  BUN 20 21* 18  CREATININE  0.86 1.02* 0.85  CALCIUM 8.7* 9.3 9.1  GFRNONAA >60 54* >60  GFRAA >60 >60 >60  ANIONGAP 8 13 9      Recent Labs Lab 12/12/16 2111 12/19/16 0418  PROT 7.2 6.7  ALBUMIN 3.7 3.4*  AST 20 23  ALT 24 36  ALKPHOS 96 69  BILITOT 0.6 0.5   Hematology Recent Labs Lab 12/13/16 0514 12/18/16 1729 12/19/16 0418  WBC 7.7 8.0 7.3  RBC 4.12 4.23 4.28  HGB 11.1* 11.8* 11.9*  HCT 35.9* 37.7 37.8  MCV 87.1 89.1 88.3  MCH 26.9 27.9 27.8  MCHC 30.9 31.3 31.5  RDW 15.0 15.2 15.1  PLT 230 263 252   Cardiac Enzymes Recent Labs Lab 12/18/16 2344 12/19/16 0418  TROPONINI <0.03 <0.03    Recent Labs Lab 12/12/16 2124 12/18/16 1738  TROPIPOC 0.00 0.00     DDimer  Recent Labs Lab 12/18/16 2344  DDIMER <0.27    Radiology/Studies:  Dg Chest 2 View  Result Date: 12/18/2016 CLINICAL DATA:  Syncopal episode EXAM: CHEST  2 VIEW COMPARISON:  12/12/2016 FINDINGS: No focal pulmonary infiltrate, consolidation or effusion. Borderline cardiomegaly. No pneumothorax. Calcified granuloma in the left upper lobe. Left axillary surgical clips. IMPRESSION: Borderline cardiomegaly.  No edema or infiltrate. Electronically Signed   By: Donavan Foil M.D.   On: 12/18/2016 18:06   Mr Brain Wo Contrast  Result Date: 12/18/2016 CLINICAL DATA:  Initial evaluation for acute syncope. EXAM: MRI HEAD WITHOUT CONTRAST TECHNIQUE: Multiplanar, multiecho pulse sequences of the brain and surrounding structures were obtained without intravenous contrast. COMPARISON:  Prior CT from 12/12/2016. FINDINGS: Brain: Mild diffuse prominence of the CSF containing spaces is compatible with generalized cerebral atrophy. Minimal patchy T2/FLAIR hyperintensity within the supratentorial cerebral white matter noted, nonspecific, but felt to be within normal limits for age. No abnormal foci of restricted diffusion to suggest acute or subacute ischemia. Gray-white matter differentiation maintained. Few tiny remote left cerebellar infarcts  noted. No other evidence for chronic infarction. No evidence for acute or chronic intracranial hemorrhage. No mass lesion, midline shift or mass effect. Ventricles normal size without evidence for hydrocephalus. No extra-axial fluid collection. Major dural sinuses are grossly patent. Pituitary gland suprasellar region within normal limits. Vascular: Major intracranial vascular flow voids are maintained. Left vertebral artery hypoplastic. Skull and upper cervical spine: Craniocervical junction within normal limits. Visualized upper cervical spine unremarkable. Bone marrow signal intensity within normal limits. No scalp soft tissue abnormality. Sinuses/Orbits: Globes and orbital soft tissues within normal limits. Paranasal sinuses are clear. No mastoid effusion. Inner ear structures normal. IMPRESSION: 1. No acute intracranial process identified. 2. Few small remote left cerebellar infarcts. 3. Otherwise normal brain MRI for age. Electronically Signed   By: Jeannine Boga M.D.   On: 12/18/2016 23:04    Assessment and Plan:   1. Syncope - recurrent in nature. This was previously felt possibly orthostatic in nature but this most recent episode is concerning for arrhythmia as she was seated when it happened without warning. Additionally, EKG shows evidence for conduction disease with prolonged PR, RBBB and LAFB. She is not on any AVN blocking agents. We have not yet documented any brady-arrhythmias but she has also not previously worn any event monitors. Would recommend EP input. Add TSH to labs. Follow on telemetry. Will review with MD.   2. Chest pain with prior h/o nonobstructive CAD - she nonchalantly relays 8 year history of ongoing intermittent chest pain at random, not associated with exertion, most recently on Saturday  lasting all day long, not particularly severe. Fortunately despite this her troponins were negative. Continue ASA, statin.  Prior cath in 2010 nonobstructive, nuc in 2014 was  intermediate risk but appears this was felt benign as she was cleared for surgery at that time. Would be hesitant about getting Lexiscan with possibility of conduction issues.   3. Essential HTN - BP generally controlled, follow cautiously. Given recurrent syncope would not be particularly aggressive with pressure.  4. Hyperlipidemia - last LDL 44, followed by PCP.  Signed, Charlie Pitter, PA-C  12/19/2016 7:35 AM   Personally seen and examined. Agree with above.  71 year old female with obesity, bifascicular block (sees right bundle branch block, left anterior fascicular block), normal ejection fraction, nonobstructive CAD on prior cardiac catheterization, negative troponin with recurrent syncope. She denies any vagal-like symptoms such as diaphoresis, nausea. She felt brief warning stating that she was "going out" and this occurred while sitting in the car passenger seat. Previously she fell during a syncopal episode and broke her nose. Physical exam is noteworthy for pleasant obese female with regular rate and rhythm, 1/6 systolic murmur left lower sternal border no carotid bruits, neurologically nonfocal.  - With a recurrent syncope and bifascicular block, this is a class IIa ACC/HRS indication for permanent pacemaking. We will consult EP for further evaluation. I have discussed this with patient.  - Atypical chest discomfort with prior cardiac catheterization showing nonobstructive CAD and prior stress test in 2014 showing no high risk reversibility. Troponin currently is negative. Pain likely noncardiac, MSK.  We will follow along.  Candee Furbish, MD

## 2016-12-19 NOTE — Consult Note (Signed)
Cardiology Consultation:   Patient ID: Kristen [name]; [id number]; [birth year]   Admit date: 12/18/2016 Date of Consult: 12/19/2016  Primary Care Provider: Alycia Rossetti, MD Primary Cardiologist: Dr. Rayann Heman (2010)  Patient Profile:   Kristen [name] is a 71 y.o. female with a hx of HTN, HLD, anxiety/depression, arthritis, incidental cerebellar infarcts by MRI this admission, breast cancer treated surgically on Aromasin tx, poorly cpontrolled DM (last A1C 9.8)., scattered mild-moderate nonobstructive CAD by cath 2010, showing 40% LAD, 40% D2, 50% distal LAD, 20-30% AV cx, 20-30% luminal Cx, 30% prox RCA, 40-50% mRCA, LVEF normal. Nuc 12/2012 was intermediate risk - defect of the apex that appears to be mostly due to breast attenuation but also a very small area of reversible ischemia, EF normal at 74% - this was done for cardiac clearance for breast surgery and telephone notes indicate she had been cleared based on this study.  She is being seen today for the evaluation of syncope, baseline conduction system disease at the request of [facility].  History of Present Illness:   Kristen [name] has hx of recurrent syncope,   In review of records/notes, in 2016 for syncope resulting in nasal bone fracture - this occurred while standing up from the commode felt possibly 2/2 orthostasis versus micurition syncope, prompting a switch from lisinopril to amlodipine (also with hyperkalemia). She was recently admitted 7/2-12/14/16 with episode of syncope. The notes indicate this occurred while getting up from seat to go the stove to cook, but the patient reports she had been standing at the stove for about 3-4 minutes before she passed out without warning. Pan scan without acute injury. Orthostatics were borderline positive during one of the multiple checks but does not appear BP fell below 100 at anytime. She also was felt to have asymptomatic bacteruria. No formal changes made to antihypertensive regimen but note to  consider changing amlodipine to clonidine due to concern for POTS. 2D Echo 12/13/16 showed moderate LVH, EF 55%, grade 1 DD, mild TR, no sig change from prior. Rounding notes indicate tele showing NSR with PVCs.  Historically she has been seen by Dr. Rayann Heman (2014) though in the capacity of cardiac clearance prior to breast surgery)  The patient (and her daughter at bedside) relate her syncope history  1st (sep 2016)  occurred after urinating, she was exiting the bathroom and the next thing she new she was on the floor.  She has no concept of time she was out but was to weak to get herself up.  She did not feel disoriented, but had no warning and was unclear on what had happened exatly.  No preceeding symptoms of any kind, no recent illness or medicine changes, she suffered a broken nose.  Suspect to have been 2/2 her lisinopril, perhaps orthostatic or post-micturation, changed to amlodipine and found to have a UTI.  She denies waking to any symptoms.  2nd a few weeks afterwards was taking the trash out, had thrown the trash into he can and was walking back to go inside, uncertain how far she got and without warning again found her self on the ground, a passerby helping her up.  Again feeling very weak, but no clear symptoms otherwise.  (she had no new evaluation)  3rd was on 12/12/16 was in her kitchen (record indicates she had just stood up) though the patient tells me she had been in the kitchen several minutes, had washed some dishes and was getting ready to start preparing a meal when she  suddenly found herself on the floor, she thinks she hit her L side on something and the back of her head.  She called EMS and her daughter and transported.  She again woke feeling to weak to get up on her own and perhaps feeling like her HR was fast, not racing, no CP.  She was found during this evaluation to have mild orthostatic BP, and apparently reported dizziness upon standing up from the bed while here.  Medicine  felt out patient BP management with an as needed short acting agent to be adjusted with perhaps POTS as a dx, also found with UTI/cystitis  This admission, only a couple days later, was seated as a passenger in a car and fainted.  Her daughter relates what the driver reported.  She had just gotten in the car, and when she turned to back out noticed the patient had slumped forward and seemed to be mumbling something and then "wentout", she ran inside to get help and upon returning the patient was awake.  The patient states she seems to recall a sharp L sided CP, no other symptom.  With none of the events did she feel N/V, diaphoretic, no proceeding warning or symptoms of any kind.  She wakes oriented but never sure what had happened, always feels profoundly weak and unable to get up on her own.  She has for a couple years reports of getting "swimmy in my head" where she needs to go sit or lie down until it passes, perhaps 1-2x per month.  She has a couple years of c/o CP, these are vague and random, sharp in nature,m and she says probably date back to her days of seeing dr. Verl Blalock and her cath in 7829, she isn't certain and is very vague in regards to this symptom.  LABS: K+ 4.6 BUN/Creat 21/1.02 Glucose 339 poc Trop 0.00 Trop I: <0.03 WBC 8.0 H/H 11/37 plts 263 Ddimer <0.27 [place] abnormal, started on rocephin, Cx are pending TSH pending (07/22/16 was wnl 1.06)  Past Medical History:  Diagnosis Date  . Allergy   . Anxiety   . Arthritis   . Bifascicular block   . Blood transfusion without reported diagnosis   . Breast cancer ([facility])    breast-left   . Coronary artery disease, non-occlusive    a. Left heart cath 01/2009 showed 40% LAD, 40% D2, 50% distal LAD, 20-30% AV cx, 20-30% luminal Cx, 30% prox RCA, 40-50% mRCA, LVEF normal. b. Intermediate risk nuc 2014 - ? breast attn versus small area of reversible ischemia, EF 74%.  . Depression   . Diabetes mellitus   . GERD (gastroesophageal reflux  disease)   . Hemorrhoids   . Hyperlipidemia   . Hypertension   . Nephrolithiasis   . Obesity   . Overactive bladder   . Stroke [facility])    a. MRI 12/2016 - remote left cerebellar infarcts incidentally noted.  . Vitamin D deficiency     Past Surgical History:  Procedure Laterality Date  . APPENDECTOMY    . BACK SURGERY    . BREAST SURGERY    . CARDIAC CATHETERIZATION  01/2009   cath by Dr Lia Foyer revealed nonobstructive CAD  . CESAREAN SECTION    . CHOLECYSTECTOMY    . CHOLECYSTECTOMY, LAPAROSCOPIC    . COLONOSCOPY     last 2012- with polyps  . JOINT REPLACEMENT    . LITHOTRIPSY    . MASTECTOMY W/ SENTINEL NODE BIOPSY Left 12/24/2012   Procedure: LEFT MASTECTOMY  WITH LEFT SENTINEL LYMPH NODE BIOPSY;  Surgeon: Rolm Bookbinder, MD;  Location: [facility];  Service: General;  Laterality: Left;  . OOPHORECTOMY Right    1.5  ovaries rem  . PARTIAL HIP ARTHROPLASTY     left hip  . POLYPECTOMY    . REPLACEMENT TOTAL KNEE     both knees  . TONSILLECTOMY    . TOTAL MASTECTOMY Left 12/24/2012   Dr Donne Hazel     Inpatient Medications: Scheduled Meds: . amLODipine  2.5 mg Oral Daily  . aspirin EC  81 mg Oral Daily  . atorvastatin  40 mg Oral q1800  . exemestane  25 mg Oral QPC breakfast  . FLUoxetine  40 mg Oral Daily  . gabapentin  300 mg Oral TID  . insulin aspart  0-5 Units Subcutaneous QHS  . insulin aspart  0-9 Units Subcutaneous TID WC  . insulin glargine  60 Units Subcutaneous Q2200  . [START ON 12/21/2016] metFORMIN  1,000 mg Oral BID WC  . pantoprazole  40 mg Oral Daily  . sodium chloride flush  3 mL Intravenous Q12H   Continuous Infusions: . sodium chloride 50 mL/hr at 12/19/16 0009  . cefTRIAXone (ROCEPHIN)  IV Stopped (12/19/16 0107)   PRN Meds: acetaminophen **OR** acetaminophen, polyethylene glycol  Allergies:    Allergies  Allergen Reactions  . Sulfonamide Derivatives Hives    Social History:   Social History   Social History  . Marital status: Widowed      Spouse name: N/A  . Number of children: 2  . Years of education: N/A   Occupational History  . Not on file.   Social History Main Topics  . Smoking status: Never Smoker  . Smokeless tobacco: Never Used  . Alcohol use No  . Drug use: No  . Sexual activity: Not on file   Other Topics Concern  . Not on file   Social History Narrative   Lives in [place] with daughter.   Retired    Family History:   The patient's family history includes Brain cancer in her brother; Breast cancer in her mother and sister; Cancer in her mother; Heart attack (age of onset: 12) in her father; Lung cancer in her brother and brother; Lung cancer (age of onset: 14) in her brother. There is no history of Colon cancer, Colon polyps, Rectal cancer, Stomach cancer, or Esophageal cancer.  ROS:  Please see the history of present illness.  ROS  All other ROS reviewed and negative.     Physical Exam/Data:   Vitals:   12/18/16 2030 12/18/16 2145 12/18/16 2300 12/18/16 2343  BP: 131/80 128/72  131/67  Pulse: 68 63  66  Resp: 16 16  16   Temp:    97.8 F (36.6 C)  TempSrc:    Oral  SpO2: 96% 97%    Weight:   195 lb 11.2 oz (88.8 kg)   Height:   5\' 2"  (1.575 m)     Intake/Output Summary (Last 24 hours) at 12/19/16 0819 Last data filed at 12/19/16 0600  Gross per 24 hour  Intake              350 ml  Output              300 ml  Net               50 ml   Filed Weights   12/18/16 1718 12/18/16 2300  Weight: 197 lb (89.4 kg) 195 lb 11.2 oz (  88.8 kg)   Body mass index is 35.79 kg/m.  General:  Well nourished, well developed, in no acute distress HEENT: normal Lymph: no adenopathy Neck: no JVD Endocrine:  No thryomegaly Vascular: No carotid bruits; FA pulses 2+ bilaterally without bruits  Cardiac:  normal S1, S2; RRR; no murmurs, gallops or rubs are appreciated Lungs:  clear to auscultation bilaterally, no wheezing, rhonchi or rales  Abd: soft, non-tender, obese Ext: no  edema Musculoskeletal:  No deformities, BUE and BLE strength normal and equal Skin: warm and dry  Neuro:  CNs 2-12 intact, no focal abnormalities noted Psych:  Normal affect   EKG:  The EKG was personally reviewed and demonstrates:   12/18/16: SR 73bpm, 1st degree AVBlock, RBBB,LAD, PR 215ms, QRS 187ms, QTc 470ms 12/12/16: SR 77bpm, RBBB, LAD, PR 248ms, QRS 136ms, QTC 424ms 12/17/12: SR 66bpm,  RBBB, LAD PR 237ms, QRS 133ms, QTc 430ms 02/20/09; SR 64bpm, RBBB, LAD PR 28ms, QRS 133ms, Qtc 453ms Telemetry:  Telemetry was personally reviewed and demonstrates:   SR, no bradycardia or arrhythmias are noted  Relevant CV Studies:  12/13/16: TTE Study Conclusions - Left ventricle: The cavity size was normal. Wall thickness was   increased in a pattern of moderate LVH. Systolic function was   normal. The estimated ejection fraction was in the range of 55%   to 60%. Normal GLPSS at -21%. Wall motion was normal; there were   no regional wall motion abnormalities. Doppler parameters are   consistent with abnormal left ventricular relaxation (grade 1   diastolic dysfunction). The E/e&' ratio is between 8-15,   suggesting indeterminate LV filling pressure. - Aortic valve: Trileaflet. Sclerosis without stenosis. There was   no regurgitation. - Tricuspid valve: There was mild regurgitation. - Pulmonary arteries: PA peak pressure: 34 mm Hg (S). - Inferior vena cava: The vessel was normal in size. The   respirophasic diameter changes were in the normal range (>= 50%),   consistent with normal central venous pressure. Impressions: - Compared to a prior echo in 2016, there are no significant   changes.   12/20/12: Impression Exercise Capacity:  Lexiscan with no exercise. BP Response:  Normal blood pressure response. Clinical Symptoms:  No significant symptoms noted. ECG Impression:  No significant ST segment change suggestive of ischemia. Comparison with Prior Nuclear Study: No images to  compare Overall Impression:  Intermediate risk stress nuclear study There is a defect of the apex that appears to be mostly due to breast attenuation but also a very small area of reversible ischemia.  . LV Ejection Fraction: 74%.  LV Wall Motion:  NL LV Function; NL Wall Motion.  The apex contracts especially well.   Laboratory Data:  Chemistry Recent Labs Lab 12/13/16 0514 12/18/16 1729 12/19/16 0418  NA 140 137 140  K 4.4 4.6 3.8  CL 104 102 104  CO2 28 22 27   GLUCOSE 191* 339* 213*  BUN 20 21* 18  CREATININE 0.86 1.02* 0.85  CALCIUM 8.7* 9.3 9.1  GFRNONAA >60 54* >60  GFRAA >60 >60 >60  ANIONGAP 8 13 9      Recent Labs Lab 12/12/16 2111 12/19/16 0418  PROT 7.2 6.7  ALBUMIN 3.7 3.4*  AST 20 23  ALT 24 36  ALKPHOS 96 69  BILITOT 0.6 0.5   Hematology Recent Labs Lab 12/13/16 0514 12/18/16 1729 12/19/16 0418  WBC 7.7 8.0 7.3  RBC 4.12 4.23 4.28  HGB 11.1* 11.8* 11.9*  HCT 35.9* 37.7 37.8  MCV 87.1 89.1 88.3  MCH 26.9 27.9 27.8  MCHC 30.9 31.3 31.5  RDW 15.0 15.2 15.1  PLT 230 263 252   Cardiac Enzymes Recent Labs Lab 12/18/16 2344 12/19/16 0418  TROPONINI <0.03 <0.03    Recent Labs Lab 12/12/16 2124 12/18/16 1738  TROPIPOC 0.00 0.00     DDimer  Recent Labs Lab 12/18/16 2344  DDIMER <0.27    Radiology/Studies:  Dg Chest 2 View Result Date: 12/18/2016 CLINICAL DATA:  Syncopal episode EXAM: CHEST  2 VIEW COMPARISON:  12/12/2016 FINDINGS: No focal pulmonary infiltrate, consolidation or effusion. Borderline cardiomegaly. No pneumothorax. Calcified granuloma in the left upper lobe. Left axillary surgical clips. IMPRESSION: Borderline cardiomegaly.  No edema or infiltrate. Electronically Signed   By: Donavan Foil M.D.   On: 12/18/2016 18:06   Mr Brain Wo Contrast Result Date: 12/18/2016 CLINICAL DATA:  Initial evaluation for acute syncope. EXAM: MRI HEAD WITHOUT CONTRAST TECHNIQUE: Multiplanar, multiecho pulse sequences of the brain and  surrounding structures were obtained without intravenous contrast. COMPARISON:  Prior CT from 12/12/2016. FINDINGS: Brain: Mild diffuse prominence of the CSF containing spaces is compatible with generalized cerebral atrophy. Minimal patchy T2/FLAIR hyperintensity within the supratentorial cerebral white matter noted, nonspecific, but felt to be within normal limits for age. No abnormal foci of restricted diffusion to suggest acute or subacute ischemia. Gray-white matter differentiation maintained. Few tiny remote left cerebellar infarcts noted. No other evidence for chronic infarction. No evidence for acute or chronic intracranial hemorrhage. No mass lesion, midline shift or mass effect. Ventricles normal size without evidence for hydrocephalus. No extra-axial fluid collection. Major dural sinuses are grossly patent. Pituitary gland suprasellar region within normal limits. Vascular: Major intracranial vascular flow voids are maintained. Left vertebral artery hypoplastic. Skull and upper cervical spine: Craniocervical junction within normal limits. Visualized upper cervical spine unremarkable. Bone marrow signal intensity within normal limits. No scalp soft tissue abnormality. Sinuses/Orbits: Globes and orbital soft tissues within normal limits. Paranasal sinuses are clear. No mastoid effusion. Inner ear structures normal. IMPRESSION: 1. No acute intracranial process identified. 2. Few small remote left cerebellar infarcts. 3. Otherwise normal brain MRI for age. Electronically Signed   By: Jeannine Boga M.D.   On: 12/18/2016 23:04    Assessment and Plan:   1. Recurrent Syncope     Baseline conduction system disease with RBBB, LAFB, I do not see any home meds that may contribute     Sudden syncope without warning is suspicious for HR/rhythm, though post syncope weakness and occasional weak/lightheaded spells sound more of BP     Her last hospital stay had a number of negative orthostatic VS checks, once  was mildly abnormal supine 131/69 (67bpm) to 115/86 standing (82bpm)      She has fainted upon standing, while standing/ambulating and while seated      The patient does not drive.  UTI each visit is noted, uncertain of any significance, afebrile, normal WBC  Uncertain best course, may be best to start with monitoring.  I will try and get a better idea of frequency of her weak/non-syncopal spells, her last 2 syncopal spells were within a week of each other, a 30 day monitor may be 1st step rather then right to pacing.  Dr. Caryl Comes to see       2. CAD      ? Chronic sounding and very vague c/o CP     Negative Trop  3. DM     Poorly controlled     As per medicine team  4. UTI          As per medicine team   Signed, Baldwin Jamaica, PA-C  12/19/2016 8:19 AM PE as above  Patient has a one-year history of recurrent syncope. These episodes are abrupt in onset but rather delayed in offset.  She also has episodes of presyncope that are quite brief, while I count, lasting less than 5 seconds.  It is not clear to me whether there is a residual effect with these episodes or not. The duration of the episode in the car is probably minutes which would speak to a neurally mediated reflex more likely than a primary arrhythmia. The other episodes of very brief duration that occur and resolve while still standing with suggest an arrhythmic event.  Guidelines are variable as it applies to this group of patients with bifascicular block. The PRESS TRIAL has shown a decrease in events associated with pacing this cohort. Alternatively, ISSUE 3 supports the use of an implantable monitor, recognizing that in half this patient cohort pacing will be indicated.  I did review these options with the patient, she has elected to go with the former approach that is to say pacing with the hopes to reduce the likelihood of recurrent events. As noted, this is in compliance with guidelines.  We we will use CLS pacing to  try to help if this is in fact neurally mediated to attenuate the alacrity with which these events occur and thereby hopefully reduce the likelihood of syncope giving her enough warning to see herself  She has a history of mastectomy on the left; the operative report was reviewed describing   only sentinel lymph node biopsy. Hence, the lymphatic chain should be in place and should not be at high risk for lymphedema in the event that she has venous obstruction. We will not use her left arm IV

## 2016-12-19 NOTE — Procedures (Signed)
ELECTROENCEPHALOGRAM REPORT  Date of Study: 12/19/2016  Patient's Name: Kristen [name] MRN: [id number] Date of Birth: 10/18/1945  Referring Provider: Jani Gravel, MD  Clinical History: 71 year old female with history of breast cancer, CAD, HTN, HLD and cerebrovascular disease presents for syncope  Medications: acetaminophen (TYLENOL) suppository 650 mg   L1 acetaminophen (TYLENOL) tablet 650 mg   amLODipine (NORVASC) tablet 2.5 mg   aspirin EC tablet 81 mg   atorvastatin (LIPITOR) tablet 40 mg   cefTRIAXone (ROCEPHIN) 1 g in dextrose 5 % 50 mL IVPB   exemestane (AROMASIN) tablet 25 mg   FLUoxetine (PROZAC) capsule 40 mg   gabapentin (NEURONTIN) capsule 300 mg   insulin aspart (novoLOG) injection 0-5 Units   insulin aspart (novoLOG) injection 0-9 Units   insulin glargine (LANTUS) injection 60 Units   metFORMIN (GLUCOPHAGE) tablet 1,000 mg   pantoprazole (PROTONIX) EC tablet 40 mg   polyethylene glycol (MIRALAX / GLYCOLAX) packet 17 g   sodium chloride flush (NS) 0.9 % injection 3 mL   Technical Summary: A multichannel digital EEG recording measured by the international 10-20 system with electrodes applied with paste and impedances below 5000 ohms performed in our laboratory with EKG monitoring in an awake and asleep patient.  Hyperventilation was not performed.  Photic stimulation was performed.  The digital EEG was referentially recorded, reformatted, and digitally filtered in a variety of bipolar and referential montages for optimal display.    Description: The patient is awake and asleep during the recording.  During maximal wakefulness, there is a symmetric, medium voltage 11 Hz posterior dominant rhythm that attenuates with eye opening.  The record is symmetric.  During drowsiness and sleep, there is an increase in theta slowing of the background.  Vertex waves and symmetric sleep spindles were seen.  Photic stimulation did not elicit any abnormalities.  There were no epileptiform  discharges or electrographic seizures seen.    EKG lead was unremarkable.  Impression: This awake and asleep EEG is normal.    Clinical Correlation: A normal EEG does not exclude a clinical diagnosis of epilepsy.  If further clinical questions remain, prolonged EEG may be helpful.  Clinical correlation is advised.   Metta Clines, DO

## 2016-12-19 NOTE — Care Management Note (Addendum)
Case Management Note  Patient Details  Name: Kristen [name] MRN: [id number] Date of Birth: [birth year]  Subjective/Objective:  Pt presented for Syncope and Headache. Pt is from home alone and has support of daughter-Pt uses [facility]. Pt stated previously set up by [facility] for [facility] PT via [facility]. Per pt she stated Agency came out once and stated she didn't need any therapy. Pt has PCP: Alycia Rossetti, MD and she is able to get all of her medications without any problems.            Action/Plan: CM did call Liaison for [facility] to Clarify if still active. No additional needs identified at this time of visit.   Expected Discharge Date:                  Expected Discharge Plan:  Home/Self Care  In-House Referral:  NA  Discharge planning Services  CM Consult  Post Acute Care Choice:  NA Choice offered to:  NA  DME Arranged:  N/A DME Agency:  NA  HH Arranged:  NA HH Agency:  NA  Status of Service:  Completed, signed off  If discussed at [place] of Stay Meetings, dates discussed:    Additional Comments: 1448 12-22-16 Jacqlyn Krauss, RN,BSN [phone] PT recommendation for SNF- CSW will assist with disposition needs.    1125 12-19-16 Jacqlyn Krauss, [place] [phone] Clarification: Pt is not active with [facility].  Bethena Roys, RN 12/19/2016, 10:36 AM

## 2016-12-19 NOTE — Progress Notes (Signed)
EEG Completed; Results Pending  

## 2016-12-20 ENCOUNTER — Inpatient Hospital Stay (HOSPITAL_COMMUNITY): Payer: Medicare Other

## 2016-12-20 ENCOUNTER — Encounter (HOSPITAL_COMMUNITY): Payer: Self-pay | Admitting: Internal Medicine

## 2016-12-20 DIAGNOSIS — I453 Trifascicular block: Secondary | ICD-10-CM

## 2016-12-20 DIAGNOSIS — R739 Hyperglycemia, unspecified: Secondary | ICD-10-CM

## 2016-12-20 DIAGNOSIS — E1365 Other specified diabetes mellitus with hyperglycemia: Secondary | ICD-10-CM

## 2016-12-20 LAB — URINE CULTURE: Special Requests: NORMAL

## 2016-12-20 LAB — GLUCOSE, CAPILLARY
GLUCOSE-CAPILLARY: 251 mg/dL — AB (ref 65–99)
GLUCOSE-CAPILLARY: 353 mg/dL — AB (ref 65–99)
GLUCOSE-CAPILLARY: 327 mg/dL — AB (ref 65–99)
GLUCOSE-CAPILLARY: 296 mg/dL — AB (ref 65–99)

## 2016-12-20 MED ORDER — INSULIN ASPART 100 UNIT/ML ~~LOC~~ SOLN
5.0000 [IU] | Freq: Three times a day (TID) | SUBCUTANEOUS | Status: DC
  Administered 2016-12-20 – 2016-12-21 (×3): 5 [IU] via SUBCUTANEOUS

## 2016-12-20 MED ORDER — CEFAZOLIN SODIUM-DEXTROSE 1-4 GM/50ML-% IV SOLN
1.0000 g | Freq: Four times a day (QID) | INTRAVENOUS | Status: AC
  Administered 2016-12-20 (×2): 1 g via INTRAVENOUS
  Filled 2016-12-20 (×2): qty 50

## 2016-12-20 NOTE — Progress Notes (Signed)
Progress Note  Patient Name: Kristen [name] Date of Encounter: 12/20/2016  Primary Cardiologist: Dr. Rayann Heman (2010)  Subjective   Minimal post implant discomfort, does not request pain medicine beyond tylenol.  Otherwise no complaints.  Inpatient Medications    Scheduled Meds: . amLODipine  5 mg Oral Daily  . aspirin EC  81 mg Oral Daily  . atorvastatin  40 mg Oral q1800  . exemestane  25 mg Oral QPC breakfast  . FLUoxetine  40 mg Oral Daily  . gabapentin  300 mg Oral TID  . insulin aspart  0-5 Units Subcutaneous QHS  . insulin aspart  0-9 Units Subcutaneous TID WC  . insulin glargine  60 Units Subcutaneous Q2200  . [START ON 12/21/2016] metFORMIN  1,000 mg Oral BID WC  . pantoprazole  40 mg Oral Daily  . sodium chloride flush  3 mL Intravenous Q12H   Continuous Infusions: .  ceFAZolin (ANCEF) IV 1 g (12/20/16 0845)  . cefTRIAXone (ROCEPHIN)  IV Stopped (12/19/16 2226)   PRN Meds: acetaminophen **OR** acetaminophen, ondansetron (ZOFRAN) IV, polyethylene glycol   Vital Signs    Vitals:   12/19/16 2004 12/20/16 0300 12/20/16 0340 12/20/16 0452  BP: (!) 141/78  (!) 163/98 (!) 143/93  Pulse: 68   61  Resp: 14  18 17   Temp: 98.5 F (36.9 C)   97.7 F (36.5 C)  TempSrc: Oral   Oral  SpO2: 96%   98%  Weight:  194 lb 3.2 oz (88.1 kg)    Height:        Intake/Output Summary (Last 24 hours) at 12/20/16 1017 Last data filed at 12/20/16 0900  Gross per 24 hour  Intake              530 ml  Output             1000 ml  Net             -470 ml   Filed Weights   12/18/16 1718 12/18/16 2300 12/20/16 0300  Weight: 197 lb (89.4 kg) 195 lb 11.2 oz (88.8 kg) 194 lb 3.2 oz (88.1 kg)    Telemetry   generally AP V sensing, infrequentl V pacing - Personally Reviewed  ECG    Apaced, V sensing, RBBB - Personally Reviewed  Physical Exam   GEN: No acute distress.   Neck: No JVD Cardiac: RRR, no murmurs, rubs, or gallops.  Respiratory: Clear to auscultation  bilaterally. GI: Soft, nontender, non-distended  MS: No edema; No deformity. Neuro:  Nonfocal  Psych: Normal affect   Pacer site is stable, no hematoma, bleeding.  Labs    Chemistry Recent Labs Lab 12/18/16 1729 12/19/16 0418  NA 137 140  K 4.6 3.8  CL 102 104  CO2 22 27  GLUCOSE 339* 213*  BUN 21* 18  CREATININE 1.02* 0.85  CALCIUM 9.3 9.1  PROT  --  6.7  ALBUMIN  --  3.4*  AST  --  23  ALT  --  36  ALKPHOS  --  69  BILITOT  --  0.5  GFRNONAA 54* >60  GFRAA >60 >60  ANIONGAP 13 9     Hematology Recent Labs Lab 12/18/16 1729 12/19/16 0418  WBC 8.0 7.3  RBC 4.23 4.28  HGB 11.8* 11.9*  HCT 37.7 37.8  MCV 89.1 88.3  MCH 27.9 27.8  MCHC 31.3 31.5  RDW 15.2 15.1  PLT 263 252    Cardiac Enzymes Recent Labs Lab 12/18/16 2344  12/19/16 0418 12/19/16 0903  TROPONINI <0.03 <0.03 <0.03    Recent Labs Lab 12/18/16 1738  TROPIPOC 0.00     BNPNo results for input(s): BNP, PROBNP in the last 168 hours.   DDimer  Recent Labs Lab 12/18/16 2344  DDIMER <0.27     Radiology    Dg Chest 2 View Result Date: 12/20/2016 CLINICAL DATA:  Post pacemaker placement EXAM: CHEST  2 VIEW COMPARISON:  12/18/2016 FINDINGS: Left pacer has been placed with leads in the right atrium and right ventricle. No pneumothorax. Heart is borderline in size. Lungs are clear. No effusions. IMPRESSION: Left pacer placement with leads in the right atrium and right ventricle. No pneumothorax. Electronically Signed   By: Rolm Baptise M.D.   On: 12/20/2016 09:21    Cardiac Studies   12/13/16: TTE Study Conclusions - Left ventricle: The cavity size was normal. Wall thickness was increased in a pattern of moderate LVH. Systolic function was normal. The estimated ejection fraction was in the range of 55% to 60%. Normal GLPSS at -21%. Wall motion was normal; there were no regional wall motion abnormalities. Doppler parameters are consistent with abnormal left ventricular  relaxation (grade 1 diastolic dysfunction). The E/e&' ratio is between 8-15, suggesting indeterminate LV filling pressure. - Aortic valve: Trileaflet. Sclerosis without stenosis. There was no regurgitation. - Tricuspid valve: There was mild regurgitation. - Pulmonary arteries: PA peak pressure: 34 mm Hg (S). - Inferior vena cava: The vessel was normal in size. The respirophasic diameter changes were in the normal range (>= 50%), consistent with normal central venous pressure. Impressions: - Compared to a prior echo in 2016, there are no significant changes.   Patient Profile     71 y.o. female hx of HTN, HLD, anxiety/depression, arthritis, incidental cerebellar infarcts by MRI this admission, breast cancer treated surgically on Aromasin tx, poorly cpontrolled DM and recurrent syncope, now s/p PPM given baseline conduction disease and symptoms suggestive f at least a component of rate.rhythm etiology.   Assessment & Plan    1. Recurrent Syncope     now s/p PPM implant yesterday with Dr. Caryl Comes.     rediscused with patient PPM may improve symptoms, though not completely resolve issue with perhaps a BP component to them as well.        She received a Biotronik CLS device in effort to minimized this component.      Implant site is stable      Device check this morning with intact/stable findings      Wound care and activity restrictions were discussed with the patient      Ancef needs to complete both ordered doses then OK to discharge from our standpoint      Post pacer appointments have been arranged  2. CAD     Chronic sounding and very vague c/o CP     Negative Trop  3. DM     Poorly controlled     As per medicine team  4. UTI          As per medicine team   Signed, Baldwin Jamaica, PA-C  12/20/2016, 10:17 AM

## 2016-12-20 NOTE — Progress Notes (Signed)
Report received via Aldona Bar RN using SBAR format, reviewed new orders, VS and events of the day, assumed care of patient.

## 2016-12-20 NOTE — Progress Notes (Signed)
Inpatient Diabetes Program Recommendations  AACE/ADA: New Consensus Statement on Inpatient Glycemic Control (2015)  Target Ranges:  Prepandial:   less than 140 mg/dL      Peak postprandial:   less than 180 mg/dL (1-2 hours)      Critically ill patients:  140 - 180 mg/dL   Lab Results  Component Value Date   GLUCAP 353 (H) 12/20/2016   HGBA1C 9.8 (H) 10/31/2016    Review of Glycemic Control Results for Kristen [name], Kristen [name] (MRN [id number]) as of 12/20/2016 13:05  Ref. Range 12/19/2016 17:48 12/19/2016 21:49 12/20/2016 07:27 12/20/2016 11:02  Glucose-Capillary Latest Ref Range: 65 - 99 mg/dL 184 (H) 294 (H) 251 (H) 353 (H)   Diabetes history: DM2 Outpatient Diabetes medications: Tresiba 65 units + Nov 5-50 units tid + Met 1 gm bid Current orders for Inpatient glycemic control: Lantus 60 units + 0-9 units tid. + Met 1 gm bid  Inpatient Diabetes Program Recommendations:  Noted elevated postprandial CBGS. Please consider: -Novolog 5 units tid meal coverage if eats 50%  Thank you, Bethena Roys E. Abigayl Hor, RN, MSN, CDE  Diabetes Coordinator Inpatient Glycemic Control Team Team Pager [phone] (8am-5pm) 12/20/2016 1:07 PM

## 2016-12-20 NOTE — Plan of Care (Signed)
Problem: Education: Goal: Knowledge of cardiac device and self-care will improve Outcome: Progressing Steri strips CDI, discussed with pt importance of keeping area dry until returns to see cardiology in office.

## 2016-12-20 NOTE — Plan of Care (Signed)
Problem: Cardiac: Goal: Ability to achieve and maintain adequate cardiopulmonary perfusion will improve Outcome: Progressing Reviewed some discharge instructions and not to use her left arm for any heavy lifting or moving and not to sleep on that side or to put her arm above her head, her sling is on and she is doing well, all questions answered and patient verbalized understanding.

## 2016-12-20 NOTE — Progress Notes (Signed)
PROGRESS NOTE    Kristen [name]  QJJ:[id number] DOB: 06-03-1946 DOA: 12/18/2016 PCP: Alycia Rossetti, MD   Outpatient Specialists:     Brief Narrative:  Kristen [name]  is a 71 y.o. female, w CAD, Hypertension, Hyperlipidemia, Dm2, apparently c/o syncope. Pt was sitting in the car seat, in her friends driveway.  She started leaning forward in the passenger seat, started moaning and then passed out.  Maybe a couple of minutes.  No seizure activity noted, no incontinence, no tongue lac.  Pt afterwards c/o slight left sided headache and chest pain.  Pt states left sided, radiation to the left arm.  Pt notes that she has had chest pain for the past 1 week.  It occurs intermittently.  Nothing appears to make it better or worse.    Pt was recently admitted on 7/2 for similar complaint of syncope. Trop negative.  Cardiac echo EF wnl.  Aortic sclerosis.    In ED,  Pt noted to have blood sugar 339.  CT brain 7/8=> no acute process.   EKG nsr at 73, LAD , nl int, RBBB, no significant change from 7/5 EKG.  Trop negative Mild Dehydration Bun/Creat 21/1.02  Pt will be admitted for w/up of syncope.    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  Hga1c =9.8 (11/01/2016) fsbs ac and qhs,   Hold Metformin  Diabetic neuropathy Cont gabapentin  Hx of breast cancer Continue aromasin  Anemia trend  CAD  Cont aspirin , Lipitor, amlodipine   DVT prophylaxis:    Code Status: Full  Code   Family Communication:   Disposition Plan:  Home in AM   Consultants:   Cards  EP    Subjective: Tired today-- less chest pain  Objective: Vitals:   12/20/16 0813 12/20/16 0850 12/20/16 1029 12/20/16 1300  BP: 123/79 111/62 (!) 144/85 138/82  Pulse: 62 66  67  Resp:    15  Temp:    98.2 F (36.8 C)  TempSrc:    Oral  SpO2: 95% 98%  97%  Weight:      Height:        Intake/Output Summary (Last 24 hours) at 12/20/16 1359 Last data filed at 12/20/16 1032  Gross per 24 hour  Intake              530 ml  Output             1601 ml  Net            -1071 ml   Filed Weights   12/18/16 1718 12/18/16 2300 12/20/16 0300  Weight: 89.4 kg (197 lb) 88.8 kg (195 lb 11.2 oz) 88.1 kg (194 lb 3.2 oz)    Examination:  General exam: Appears calm and comfortable  Respiratory system: Clear to auscultation. Respiratory effort normal. Cardiovascular system: S1 & S2 heard, RRR. No JVD, murmurs, rubs, gallops or clicks. No pedal edema. Gastrointestinal system: Abdomen is nondistended, soft and nontender. No organomegaly or masses felt. Normal bowel sounds heard. Central  nervous system: Alert and oriented. No focal neurological deficits.      Data Reviewed: I have personally reviewed following labs and imaging studies  CBC:  Recent Labs Lab 12/18/16 1729 12/19/16 0418  WBC 8.0 7.3  HGB 11.8* 11.9*  HCT 37.7 37.8  MCV 89.1 88.3  PLT 263 673   Basic Metabolic Panel:  Recent Labs Lab 12/18/16 1729 12/19/16 0418  NA 137 140  K 4.6 3.8  CL 102 104  CO2 22 27  GLUCOSE 339* 213*  BUN 21* 18  CREATININE 1.02* 0.85  CALCIUM 9.3 9.1   GFR: Estimated Creatinine Clearance: 62.6 mL/min (by C-G formula based on SCr of 0.85 mg/dL). Liver Function Tests:  Recent Labs Lab 12/19/16 0418  AST 23  ALT 36  ALKPHOS 69  BILITOT 0.5  PROT 6.7  ALBUMIN 3.4*   No results for input(s): LIPASE, AMYLASE in the last 168 hours. No results for input(s): AMMONIA in the  last 168 hours. Coagulation Profile: No results for input(s): INR, PROTIME in the last 168 hours. Cardiac Enzymes:  Recent Labs Lab 12/18/16 2344 12/19/16 0418 12/19/16 0903  TROPONINI <0.03 <0.03 <0.03   BNP (last 3 results) No results for input(s): PROBNP in the last 8760 hours. HbA1C: No results for input(s): HGBA1C in the last 72 hours. CBG:  Recent Labs Lab 12/19/16 1718 12/19/16 1748 12/19/16 2149 12/20/16 0727 12/20/16 1102  GLUCAP 180* 184* 294* 251* 353*   Lipid Profile: No results for input(s): CHOL, HDL, LDLCALC, TRIG, CHOLHDL, LDLDIRECT in the last 72 hours. Thyroid Function Tests:  Recent Labs  12/19/16 0903  TSH 0.877   Anemia Panel: No results for input(s): VITAMINB12, FOLATE, FERRITIN, TIBC, IRON, RETICCTPCT in the last 72 hours. Urine analysis:    Component Value Date/Time   COLORURINE YELLOW 12/18/2016 1852   APPEARANCEUR CLEAR 12/18/2016 1852   LABSPEC 1.023 12/18/2016 1852   PHURINE 6.0 12/18/2016 1852   GLUCOSEU >=500 (A) 12/18/2016 1852   HGBUR NEGATIVE 12/18/2016 1852   BILIRUBINUR NEGATIVE 12/18/2016 1852   KETONESUR NEGATIVE 12/18/2016 1852   PROTEINUR 100 (A) 12/18/2016 1852   UROBILINOGEN 0.2 02/10/2015 1820   NITRITE NEGATIVE 12/18/2016 1852   LEUKOCYTESUR TRACE (A) 12/18/2016 1852   Sepsis Labs: @LABRCNTIP (procalcitonin:4,lacticidven:4)  ) Recent Results (from the past 240 hour(s))  Urine culture     Status: Abnormal   Collection Time: 12/12/16  7:56 PM  Result Value Ref Range Status   Specimen Description URINE, CLEAN CATCH  Final   Special Requests NONE  Final   Culture MULTIPLE SPECIES PRESENT, SUGGEST RECOLLECTION (A)  Final   Report Status 12/14/2016 FINAL  Final  Culture, Urine     Status: Abnormal   Collection Time: 12/18/16  6:52 PM  Result Value Ref Range Status   Specimen Description URINE, RANDOM  Final   Special Requests Normal  Final   Culture MULTIPLE ORGANISMS PRESENT, NONE PREDOMINANT (A)  Final    Report Status 12/20/2016 FINAL  Final  Surgical pcr screen     Status: Abnormal   Collection Time: 12/19/16  2:44 PM  Result Value Ref Range Status   MRSA, PCR NEGATIVE NEGATIVE Final   Staphylococcus aureus POSITIVE (A) NEGATIVE Final    Comment:        The Xpert SA Assay (FDA approved for NASAL specimens in patients over 62 years of age), is one component of a comprehensive surveillance program.  Test performance has been validated by [facility] for patients greater than or equal to  64 year old. It is not intended to diagnose infection nor to guide or monitor treatment.       Anti-infectives    Start     Dose/Rate Route Frequency Ordered Stop   12/20/16 0900  ceFAZolin (ANCEF) IVPB 1 g/50 mL premix     1 g 100 mL/hr over 30 Minutes Intravenous Every 6 hours 12/20/16 0829 12/20/16 2059   12/19/16 1945  ceFAZolin (ANCEF) IVPB 1 g/50 mL premix  Status:  Discontinued     1 g 100 mL/hr over 30 Minutes Intravenous Every 6 hours 12/19/16 1657 12/19/16 1709   12/19/16 1500  gentamicin (GARAMYCIN) 80 mg in sodium chloride irrigation 0.9 % 500 mL irrigation     80 mg Irrigation On call 12/19/16 1447 12/19/16 1624   12/19/16 1500  ceFAZolin (ANCEF) IVPB 2g/100 mL premix     2 g 200 mL/hr over 30 Minutes Intravenous On call 12/19/16 1447 12/19/16 1605   12/18/16 2200  cefTRIAXone (ROCEPHIN) 1 g in dextrose 5 % 50 mL IVPB     1 g 100 mL/hr over 30 Minutes Intravenous Every 24 hours 12/18/16 2119         Radiology Studies: Dg Chest 2 View  Result Date: 12/20/2016 CLINICAL DATA:  Post pacemaker placement EXAM: CHEST  2 VIEW COMPARISON:  12/18/2016 FINDINGS: Left pacer has been placed with leads in the right atrium and right ventricle. No pneumothorax. Heart is borderline in size. Lungs are clear. No effusions. IMPRESSION: Left pacer placement with leads in the right atrium and right ventricle. No pneumothorax. Electronically Signed   By: Rolm Baptise M.D.   On: 12/20/2016 09:21    Dg Chest 2 View  Result Date: 12/18/2016 CLINICAL DATA:  Syncopal episode EXAM: CHEST  2 VIEW COMPARISON:  12/12/2016 FINDINGS: No focal pulmonary infiltrate, consolidation or effusion. Borderline cardiomegaly. No pneumothorax. Calcified granuloma in the left upper lobe. Left axillary surgical clips. IMPRESSION: Borderline cardiomegaly.  No edema or infiltrate. Electronically Signed   By: Donavan Foil M.D.   On: 12/18/2016 18:06   Mr Brain Wo Contrast  Result Date: 12/18/2016 CLINICAL DATA:  Initial evaluation for acute syncope. EXAM: MRI HEAD WITHOUT CONTRAST TECHNIQUE: Multiplanar, multiecho pulse sequences of the brain and surrounding structures were obtained without intravenous contrast. COMPARISON:  Prior CT from 12/12/2016. FINDINGS: Brain: Mild diffuse prominence of the CSF containing spaces is compatible with generalized cerebral atrophy. Minimal patchy T2/FLAIR hyperintensity within the supratentorial cerebral white matter noted, nonspecific, but felt to be within normal limits for age. No abnormal foci of restricted diffusion to suggest acute or subacute ischemia. Gray-white matter differentiation maintained. Few tiny remote left cerebellar infarcts noted. No other evidence for chronic infarction. No evidence for acute or chronic intracranial hemorrhage. No mass lesion, midline shift or mass effect. Ventricles normal size without evidence for hydrocephalus. No extra-axial fluid collection. Major dural sinuses are grossly patent. Pituitary gland suprasellar region within normal limits. Vascular: Major intracranial vascular flow voids are maintained. Left vertebral artery hypoplastic. Skull and upper cervical spine: Craniocervical junction within normal limits. Visualized upper cervical spine unremarkable. Bone marrow signal intensity within normal limits. No scalp soft tissue abnormality. Sinuses/Orbits: Globes and orbital soft tissues within normal limits. Paranasal sinuses are clear. No mastoid  effusion. Inner ear structures normal. IMPRESSION: 1. No acute intracranial process identified. 2. Few small remote left cerebellar infarcts. 3. Otherwise normal brain MRI for age. Electronically Signed   By: Jeannine Boga M.D.   On: 12/18/2016 23:04  Scheduled Meds: . amLODipine  5 mg Oral Daily  . aspirin EC  81 mg Oral Daily  . atorvastatin  40 mg Oral q1800  . exemestane  25 mg Oral QPC breakfast  . FLUoxetine  40 mg Oral Daily  . gabapentin  300 mg Oral TID  . insulin aspart  0-5 Units Subcutaneous QHS  . insulin aspart  0-9 Units Subcutaneous TID WC  . insulin glargine  60 Units Subcutaneous Q2200  . [START ON 12/21/2016] metFORMIN  1,000 mg Oral BID WC  . pantoprazole  40 mg Oral Daily  . sodium chloride flush  3 mL Intravenous Q12H   Continuous Infusions: .  ceFAZolin (ANCEF) IV Stopped (12/20/16 0915)  . cefTRIAXone (ROCEPHIN)  IV Stopped (12/19/16 2226)     LOS: 1 day    Time spent: 25 min    [place], DO [facility] Pager [phone]  If 7PM-7AM, please contact night-coverage [email or web address] Password [email or web address] 12/20/2016, 1:59 PM

## 2016-12-20 NOTE — Discharge Instructions (Signed)
° ° °  Supplemental Discharge Instructions for  Pacemaker/Defibrillator Patients  Activity No heavy lifting or vigorous activity with your left/right arm for 6 to 8 weeks.  Do not raise your left/right arm above your head for one week.  Gradually raise your affected arm as drawn below.             12/23/16                     12/24/16                    12/25/16                  12/26/16 __  NO DRIVING patient does not drive.  WOUND CARE - Keep the wound area clean and dry.  Do not get this area wet, no showers for 1 week; you may shower on 12/26/16. - The tape/steri-strips on your wound will fall off; do not pull them off.  No bandage is needed on the site.  DO  NOT apply any creams, oils, or ointments to the wound area. - If you notice any drainage or discharge from the wound, any swelling or bruising at the site, or you develop a fever > 101? F after you are discharged home, call the office at once.  Special Instructions - You are still able to use cellular telephones; use the ear opposite the side where you have your pacemaker/defibrillator.  Avoid carrying your cellular phone near your device. - When traveling through airports, show security personnel your identification card to avoid being screened in the metal detectors.  Ask the security personnel to use the hand wand. - Avoid arc welding equipment, MRI testing (magnetic resonance imaging), TENS units (transcutaneous nerve stimulators).  Call the office for questions about other devices. - Avoid electrical appliances that are in poor condition or are not properly grounded. - Microwave ovens are safe to be near or to operate.  Additional information for defibrillator patients should your device go off: - If your device goes off ONCE and you feel fine afterward, notify the device clinic nurses. - If your device goes off ONCE and you do not feel well afterward, call 911. - If your device goes off TWICE, call 911. - If your device goes off  THREE times in one day, call 911.  DO NOT DRIVE YOURSELF OR A FAMILY MEMBER WITH A DEFIBRILLATOR TO THE HOSPITAL--CALL 911.

## 2016-12-21 DIAGNOSIS — I10 Essential (primary) hypertension: Secondary | ICD-10-CM

## 2016-12-21 DIAGNOSIS — I452 Bifascicular block: Principal | ICD-10-CM

## 2016-12-21 LAB — BASIC METABOLIC PANEL
ANION GAP: 7 (ref 5–15)
BUN: 19 mg/dL (ref 6–20)
CALCIUM: 9.3 mg/dL (ref 8.9–10.3)
CO2: 27 mmol/L (ref 22–32)
CREATININE: 0.99 mg/dL (ref 0.44–1.00)
Chloride: 100 mmol/L — ABNORMAL LOW (ref 101–111)
GFR calc Af Amer: >60 mL/min (ref >60)
GFR, EST NON AFRICAN AMERICAN: 56 mL/min — AB (ref >60)
Glucose, Bld: 335 mg/dL — ABNORMAL HIGH (ref 65–99)
Potassium: 4.3 mmol/L (ref 3.5–5.1)
Sodium: 134 mmol/L — ABNORMAL LOW (ref 135–145)

## 2016-12-21 LAB — GLUCOSE, CAPILLARY
GLUCOSE-CAPILLARY: 313 mg/dL — AB (ref 65–99)
GLUCOSE-CAPILLARY: 454 mg/dL — AB (ref 65–99)
GLUCOSE-CAPILLARY: 390 mg/dL — AB (ref 65–99)
Glucose-Capillary: 304 mg/dL — ABNORMAL HIGH (ref 65–99)
Glucose-Capillary: 248 mg/dL — ABNORMAL HIGH (ref 65–99)
Glucose-Capillary: 248 mg/dL — ABNORMAL HIGH (ref 65–99)

## 2016-12-21 LAB — CBC
HCT: 40.8 % (ref 36.0–46.0)
Hemoglobin: 12.5 g/dL (ref 12.0–15.0)
MCH: 27.2 pg (ref 26.0–34.0)
MCHC: 30.6 g/dL (ref 30.0–36.0)
MCV: 88.9 fL (ref 78.0–100.0)
PLATELETS: 243 10*3/uL (ref 150–400)
RBC: 4.59 MIL/uL (ref 3.87–5.11)
RDW: 15.3 % (ref 11.5–15.5)
WBC: 8 10*3/uL (ref 4.0–10.5)

## 2016-12-21 MED ORDER — INSULIN ASPART 100 UNIT/ML ~~LOC~~ SOLN
10.0000 [IU] | Freq: Three times a day (TID) | SUBCUTANEOUS | Status: DC
  Administered 2016-12-21 – 2016-12-22 (×3): 10 [IU] via SUBCUTANEOUS

## 2016-12-21 MED ORDER — INSULIN ASPART 100 UNIT/ML ~~LOC~~ SOLN
0.0000 [IU] | Freq: Every day | SUBCUTANEOUS | Status: DC
  Administered 2016-12-21: 2 [IU] via SUBCUTANEOUS

## 2016-12-21 MED ORDER — INSULIN GLARGINE 100 UNIT/ML ~~LOC~~ SOLN
65.0000 [IU] | Freq: Every day | SUBCUTANEOUS | Status: DC
  Administered 2016-12-21: 65 [IU] via SUBCUTANEOUS
  Filled 2016-12-21 (×2): qty 0.65

## 2016-12-21 MED ORDER — INSULIN ASPART 100 UNIT/ML ~~LOC~~ SOLN
0.0000 [IU] | Freq: Three times a day (TID) | SUBCUTANEOUS | Status: DC
  Administered 2016-12-21: 20 [IU] via SUBCUTANEOUS
  Administered 2016-12-21 – 2016-12-22 (×2): 7 [IU] via SUBCUTANEOUS
  Administered 2016-12-22 (×2): 11 [IU] via SUBCUTANEOUS

## 2016-12-21 MED FILL — Cefazolin Sodium-Dextrose IV Solution 2 GM/100ML-4%: INTRAVENOUS | Qty: 100 | Status: AC

## 2016-12-21 NOTE — Progress Notes (Signed)
Inpatient Diabetes Program Recommendations  AACE/ADA: New Consensus Statement on Inpatient Glycemic Control (2015)  Target Ranges:  Prepandial:   less than 140 mg/dL      Peak postprandial:   less than 180 mg/dL (1-2 hours)      Critically ill patients:  140 - 180 mg/dL   Lab Results  Component Value Date   GLUCAP 454 (H) 12/21/2016   HGBA1C 9.8 (H) 10/31/2016    Review of Glycemic Control Results for Kristen [name], Kristen [name] (MRN [id number]) as of 12/21/2016 12:08  Ref. Range 12/20/2016 16:26 12/20/2016 21:09 12/20/2016 22:26 12/21/2016 08:02 12/21/2016 11:22  Glucose-Capillary Latest Ref Range: 65 - 99 mg/dL 327 (H) 313 (H) 296 (H) 304 (H) 454 (H)   Inpatient Diabetes Program Recommendations:  Elevated CBGs. Please consider - increase in Novolog meal coverage to 10 units tid - increase Lantus to 65 units  Thank you, Bethena Roys E. Timberlynn Kizziah, RN, MSN, CDE  Diabetes Coordinator Inpatient Glycemic Control Team Team Pager [phone] (8am-5pm) 12/21/2016 12:10 PM

## 2016-12-21 NOTE — Progress Notes (Signed)
   12/21/16 1405  Clinical Encounter Type  Visited With Patient  Visit Type Follow-up  Spiritual Encounters  Spiritual Needs Brochure  Stress Factors  Patient Stress Factors Health changes  Introduction to Pt. AD completed and notarized. Original to Pt and copy to chart.

## 2016-12-21 NOTE — Progress Notes (Signed)
PROGRESS NOTE    Kristen [name]  UVO:[id number] DOB: 1945-12-23 DOA: 12/18/2016 PCP: Alycia Rossetti, MD   Outpatient Specialists:     Brief Narrative:  Kristen [name]  is a 71 y.o. female, w CAD, Hypertension, Hyperlipidemia, Dm2, apparently c/o syncope. Pt was sitting in the car seat, in her friends driveway.  She started leaning forward in the passenger seat, started moaning and then passed out.  Maybe a couple of minutes.  No seizure activity noted, no incontinence, no tongue lac.  Pt afterwards c/o slight left sided headache and chest pain.  Pt states left sided, radiation to the left arm.  Pt notes that she has had chest pain for the past 1 week.  It occurs intermittently.  Nothing appears to make it better or worse.    Pt was recently admitted on 7/2 for similar complaint of syncope. Trop negative.  Cardiac echo EF wnl.  Aortic sclerosis.    In ED,  Pt noted to have blood sugar 339.  CT brain 7/8=> no acute process.   EKG nsr at 73, LAD , nl int, RBBB, no significant change from 7/5 EKG.  Trop negative Mild Dehydration Bun/Creat 21/1.02  Pt will be admitted for w/up of syncope.    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  Hga1c =9.8 (11/01/2016) Increase meal coverage and lantus -will watch overnight for better BS control  Diabetic neuropathy Cont gabapentin  Hx of breast cancer Continue aromasin  Anemia trend  CAD  Cont aspirin , Lipitor, amlodipine   DVT  prophylaxis:    Code Status: Full Code   Family Communication:   Disposition Plan:  Home in AM   Consultants:   Cards  EP    Subjective: Not feeling well-- has not walked-- blood sugar high  Objective: Vitals:   12/20/16 1300 12/20/16 1942 12/21/16 0655 12/21/16 1335  BP: 138/82 (!) 145/79 (!) 135/97 133/76  Pulse: 67  72 73  Resp: 15 16 16 18   Temp: 98.2 F (36.8 C)  97.8 F (36.6 C) 97.9 F (36.6 C)  TempSrc: Oral  Oral Oral  SpO2: 97% 98% 97% 94%  Weight:   87.4 kg (192 lb 9.6 oz)   Height:        Intake/Output Summary (Last 24 hours) at 12/21/16 1639 Last data filed at 12/21/16 1337  Gross per 24 hour  Intake              600 ml  Output             1650 ml  Net            -1050 ml   Filed Weights   12/18/16 2300 12/20/16 0300 12/21/16 0655  Weight: 88.8 kg (195 lb 11.2 oz) 88.1 kg (194 lb 3.2 oz) 87.4 kg (192 lb 9.6 oz)    Examination:  General exam: ill appearing- flush Respiratory system: Clear Cardiovascular system: rrr Gastrointestinal system: +BS, soft Central nervous system: Alert and oriented. No focal neurological deficits.  Data Reviewed: I have personally reviewed following labs and imaging studies  CBC:  Recent Labs Lab 12/18/16 1729 12/19/16 0418 12/21/16 0423  WBC 8.0 7.3 8.0  HGB 11.8* 11.9* 12.5  HCT 37.7 37.8 40.8  MCV 89.1 88.3 88.9  PLT 263 252 333   Basic Metabolic Panel:  Recent Labs Lab 12/18/16 1729 12/19/16 0418 12/21/16 0423  NA 137 140 134*  K 4.6 3.8 4.3  CL 102 104 100*  CO2 22 27 27   GLUCOSE 339* 213* 335*  BUN 21* 18 19  CREATININE 1.02* 0.85 0.99  CALCIUM 9.3 9.1 9.3   GFR: Estimated Creatinine Clearance: 53.5 mL/min (by C-G formula based on SCr of 0.99 mg/dL). Liver Function Tests:  Recent Labs Lab 12/19/16 0418  AST 23  ALT 36  ALKPHOS 69  BILITOT 0.5  PROT 6.7  ALBUMIN 3.4*   No results for input(s): LIPASE, AMYLASE in the last 168 hours. No results for input(s):  AMMONIA in the last 168 hours. Coagulation Profile: No results for input(s): INR, PROTIME in the last 168 hours. Cardiac Enzymes:  Recent Labs Lab 12/18/16 2344 12/19/16 0418 12/19/16 0903  TROPONINI <0.03 <0.03 <0.03   BNP (last 3 results) No results for input(s): PROBNP in the last 8760 hours. HbA1C: No results for input(s): HGBA1C in the last 72 hours. CBG:  Recent Labs Lab 12/20/16 2226 12/21/16 0802 12/21/16 1122 12/21/16 1246 12/21/16 1557  GLUCAP 296* 304* 454* 390* 248*   Lipid Profile: No results for input(s): CHOL, HDL, LDLCALC, TRIG, CHOLHDL, LDLDIRECT in the last 72 hours. Thyroid Function Tests:  Recent Labs  12/19/16 0903  TSH 0.877   Anemia Panel: No results for input(s): VITAMINB12, FOLATE, FERRITIN, TIBC, IRON, RETICCTPCT in the last 72 hours. Urine analysis:    Component Value Date/Time   COLORURINE YELLOW 12/18/2016 1852   APPEARANCEUR CLEAR 12/18/2016 1852   LABSPEC 1.023 12/18/2016 1852   PHURINE 6.0 12/18/2016 1852   GLUCOSEU >=500 (A) 12/18/2016 1852   HGBUR NEGATIVE 12/18/2016 1852   BILIRUBINUR NEGATIVE 12/18/2016 1852   KETONESUR NEGATIVE 12/18/2016 1852   PROTEINUR 100 (A) 12/18/2016 1852   UROBILINOGEN 0.2 02/10/2015 1820   NITRITE NEGATIVE 12/18/2016 1852   LEUKOCYTESUR TRACE (A) 12/18/2016 1852     ) Recent Results (from the past 240 hour(s))  Urine culture     Status: Abnormal   Collection Time: 12/12/16  7:56 PM  Result Value Ref Range Status   Specimen Description URINE, CLEAN CATCH  Final   Special Requests NONE  Final   Culture MULTIPLE SPECIES PRESENT, SUGGEST RECOLLECTION (A)  Final   Report Status 12/14/2016 FINAL  Final  Culture, Urine     Status: Abnormal   Collection Time: 12/18/16  6:52 PM  Result Value Ref Range Status   Specimen Description URINE, RANDOM  Final   Special Requests Normal  Final   Culture MULTIPLE ORGANISMS PRESENT, NONE PREDOMINANT (A)  Final   Report Status 12/20/2016 FINAL  Final    Surgical pcr screen     Status: Abnormal   Collection Time: 12/19/16  2:44 PM  Result Value Ref Range Status   MRSA, PCR NEGATIVE NEGATIVE Final   Staphylococcus aureus POSITIVE (A) NEGATIVE Final    Comment:        The Xpert SA Assay (FDA approved for NASAL specimens in patients over 44 years of age), is one component of a comprehensive surveillance program.  Test performance has been validated by [facility] for patients greater than or  equal to 57 year old. It is not intended to diagnose infection nor to guide or monitor treatment.       Anti-infectives    Start     Dose/Rate Route Frequency Ordered Stop   12/20/16 0900  ceFAZolin (ANCEF) IVPB 1 g/50 mL premix     1 g 100 mL/hr over 30 Minutes Intravenous Every 6 hours 12/20/16 0829 12/20/16 1519   12/19/16 1945  ceFAZolin (ANCEF) IVPB 1 g/50 mL premix  Status:  Discontinued     1 g 100 mL/hr over 30 Minutes Intravenous Every 6 hours 12/19/16 1657 12/19/16 1709   12/19/16 1500  gentamicin (GARAMYCIN) 80 mg in sodium chloride irrigation 0.9 % 500 mL irrigation     80 mg Irrigation On call 12/19/16 1447 12/19/16 1624   12/19/16 1500  ceFAZolin (ANCEF) IVPB 2g/100 mL premix     2 g 200 mL/hr over 30 Minutes Intravenous On call 12/19/16 1447 12/19/16 1605   12/18/16 2200  cefTRIAXone (ROCEPHIN) 1 g in dextrose 5 % 50 mL IVPB  Status:  Discontinued     1 g 100 mL/hr over 30 Minutes Intravenous Every 24 hours 12/18/16 2119 12/20/16 1403       Radiology Studies: Dg Chest 2 View  Result Date: 12/20/2016 CLINICAL DATA:  Post pacemaker placement EXAM: CHEST  2 VIEW COMPARISON:  12/18/2016 FINDINGS: Left pacer has been placed with leads in the right atrium and right ventricle. No pneumothorax. Heart is borderline in size. Lungs are clear. No effusions. IMPRESSION: Left pacer placement with leads in the right atrium and right ventricle. No pneumothorax. Electronically Signed   By: Rolm Baptise M.D.   On: 12/20/2016 09:21         Scheduled Meds: . amLODipine  5 mg Oral Daily  . aspirin EC  81 mg Oral Daily  . atorvastatin  40 mg Oral q1800  . exemestane  25 mg Oral QPC breakfast  . FLUoxetine  40 mg Oral Daily  . gabapentin  300 mg Oral TID  . insulin aspart  0-20 Units Subcutaneous TID WC  . insulin aspart  0-5 Units Subcutaneous QHS  . insulin aspart  10 Units Subcutaneous TID WC  . insulin glargine  60 Units Subcutaneous Q2200  . metFORMIN  1,000 mg Oral BID WC  . pantoprazole  40 mg Oral Daily  . sodium chloride flush  3 mL Intravenous Q12H   Continuous Infusions:    LOS: 2 days    Time spent: 25 min    [facility], DO [facility] Pager [phone]  If 7PM-7AM, please contact night-coverage [email or web address] Password [facility] 12/21/2016, 4:39 PM

## 2016-12-22 DIAGNOSIS — E1365 Other specified diabetes mellitus with hyperglycemia: Secondary | ICD-10-CM | POA: Diagnosis not present

## 2016-12-22 DIAGNOSIS — M6281 Muscle weakness (generalized): Secondary | ICD-10-CM | POA: Diagnosis not present

## 2016-12-22 DIAGNOSIS — I2510 Atherosclerotic heart disease of native coronary artery without angina pectoris: Secondary | ICD-10-CM | POA: Diagnosis not present

## 2016-12-22 DIAGNOSIS — I452 Bifascicular block: Secondary | ICD-10-CM | POA: Diagnosis not present

## 2016-12-22 DIAGNOSIS — R55 Syncope and collapse: Secondary | ICD-10-CM | POA: Diagnosis not present

## 2016-12-22 DIAGNOSIS — E119 Type 2 diabetes mellitus without complications: Secondary | ICD-10-CM | POA: Diagnosis not present

## 2016-12-22 DIAGNOSIS — I10 Essential (primary) hypertension: Secondary | ICD-10-CM | POA: Diagnosis not present

## 2016-12-22 DIAGNOSIS — E785 Hyperlipidemia, unspecified: Secondary | ICD-10-CM | POA: Diagnosis not present

## 2016-12-22 DIAGNOSIS — E0841 Diabetes mellitus due to underlying condition with diabetic mononeuropathy: Secondary | ICD-10-CM

## 2016-12-22 DIAGNOSIS — Z5189 Encounter for other specified aftercare: Secondary | ICD-10-CM | POA: Diagnosis not present

## 2016-12-22 DIAGNOSIS — I499 Cardiac arrhythmia, unspecified: Secondary | ICD-10-CM | POA: Diagnosis not present

## 2016-12-22 DIAGNOSIS — R2681 Unsteadiness on feet: Secondary | ICD-10-CM | POA: Diagnosis not present

## 2016-12-22 DIAGNOSIS — N3941 Urge incontinence: Secondary | ICD-10-CM | POA: Diagnosis not present

## 2016-12-22 DIAGNOSIS — G4730 Sleep apnea, unspecified: Secondary | ICD-10-CM | POA: Diagnosis not present

## 2016-12-22 DIAGNOSIS — Z741 Need for assistance with personal care: Secondary | ICD-10-CM | POA: Diagnosis not present

## 2016-12-22 DIAGNOSIS — Z959 Presence of cardiac and vascular implant and graft, unspecified: Secondary | ICD-10-CM | POA: Diagnosis not present

## 2016-12-22 DIAGNOSIS — I453 Trifascicular block: Secondary | ICD-10-CM | POA: Diagnosis not present

## 2016-12-22 DIAGNOSIS — Z950 Presence of cardiac pacemaker: Secondary | ICD-10-CM | POA: Diagnosis not present

## 2016-12-22 LAB — GLUCOSE, CAPILLARY
GLUCOSE-CAPILLARY: 243 mg/dL — AB (ref 65–99)
Glucose-Capillary: 265 mg/dL — ABNORMAL HIGH (ref 65–99)
Glucose-Capillary: 257 mg/dL — ABNORMAL HIGH (ref 65–99)

## 2016-12-22 LAB — BASIC METABOLIC PANEL
Anion gap: 9 (ref 5–15)
BUN: 22 mg/dL — AB (ref 6–20)
CALCIUM: 9.2 mg/dL (ref 8.9–10.3)
CO2: 27 mmol/L (ref 22–32)
CREATININE: 1.14 mg/dL — AB (ref 0.44–1.00)
Chloride: 99 mmol/L — ABNORMAL LOW (ref 101–111)
GFR calc Af Amer: 55 mL/min — ABNORMAL LOW (ref >60)
GFR calc non Af Amer: 47 mL/min — ABNORMAL LOW (ref >60)
GLUCOSE: 242 mg/dL — AB (ref 65–99)
Potassium: 4.1 mmol/L (ref 3.5–5.1)
Sodium: 135 mmol/L (ref 135–145)

## 2016-12-22 MED ORDER — CHLORHEXIDINE GLUCONATE CLOTH 2 % EX PADS: 6.0000 | MEDICATED_PAD | Freq: Every day | CUTANEOUS | Status: DC

## 2016-12-22 MED ORDER — INSULIN ASPART 100 UNIT/ML ~~LOC~~ SOLN
15.0000 [IU] | Freq: Three times a day (TID) | SUBCUTANEOUS | Status: DC
  Administered 2016-12-22: 15 [IU] via SUBCUTANEOUS

## 2016-12-22 MED ORDER — INSULIN GLARGINE 100 UNIT/ML ~~LOC~~ SOLN: 65.0000 [IU] | Freq: Every day | SUBCUTANEOUS | 11 refills | Status: DC

## 2016-12-22 MED ORDER — INSULIN ASPART 100 UNIT/ML ~~LOC~~ SOLN: 15.0000 [IU] | Freq: Three times a day (TID) | SUBCUTANEOUS | 11 refills | Status: DC

## 2016-12-22 MED ORDER — INSULIN ASPART 100 UNIT/ML ~~LOC~~ SOLN: 0.0000 [IU] | Freq: Three times a day (TID) | SUBCUTANEOUS | 11 refills | Status: DC

## 2016-12-22 MED ORDER — AMLODIPINE BESYLATE 5 MG PO TABS: 5.0000 mg | ORAL_TABLET | Freq: Every day | ORAL | Status: DC

## 2016-12-22 MED ORDER — INSULIN ASPART 100 UNIT/ML ~~LOC~~ SOLN: 0.0000 [IU] | Freq: Every day | SUBCUTANEOUS | 11 refills | Status: DC

## 2016-12-22 MED ORDER — MUPIROCIN 2 % EX OINT: 1.0000 "application " | TOPICAL_OINTMENT | Freq: Two times a day (BID) | CUTANEOUS | Status: DC

## 2016-12-22 MED ORDER — GABAPENTIN 300 MG PO CAPS: 300.0000 mg | ORAL_CAPSULE | Freq: Three times a day (TID) | ORAL | Status: DC | PRN

## 2016-12-22 NOTE — Clinical Social Work Note (Signed)
Clinical Social Work Assessment  Patient Details  Name: Kristen [name] MRN: [id number] Date of Birth: [birth year]  Date of referral:  12/22/16               Reason for consult:  Facility Placement                Permission sought to share information with:  Facility Sport and exercise psychologist, Family Supports Permission granted to share information::  Yes, Verbal Permission Granted  Name::     [name]::  SNFs  Relationship::  Daughter   Contact Information:  [phone]   Housing/Transportation Living arrangements for the past 2 months:  Mobile Home Source of Information:  Patient Patient Interpreter Needed:  None Criminal Activity/Legal Involvement Pertinent to Current Situation/Hospitalization:  No - Comment as needed Significant Relationships:  Adult Children Lives with:  Self Do you feel safe going back to the place where you live?  Yes Need for family participation in patient care:  No (Coment)  Care giving concerns: Patient is from home alone and daughter lives in the area. No family at bedside. Patient requested CSW call daughter. PT recommneds SNF.   Social Worker assessment / plan: CSW met with patient briefly at bedside. Patient is agreeable to SNF. CSW faxed out referrals and called facilities to find placement for patient. CSW will follow up with patient and family for bed choice. CSW will support with discharge.  Employment status:  Retired Forensic scientist:  Medicare PT Recommendations:  [place] / Referral to community resources:  [place]  Patient/Family's Response to care: Patient agreeable to SNF and appreciative of care.  Patient/Family's Understanding of and Emotional Response to Diagnosis, Current Treatment, and Prognosis: Patient understanding of medical condition and hopeful for mobility recovery at SNF.  Emotional Assessment Appearance:  Appears stated age Attitude/Demeanor/Rapport:  Other  (appropriate) Affect (typically observed):  Appropriate, Accepting Orientation:  Oriented to Self, Oriented to Place, Oriented to  Time, Oriented to Situation Alcohol / Substance use:  Not Applicable Psych involvement (Current and /or in the community):  No (Comment)  Discharge Needs  Concerns to be addressed:  Discharge Planning Concerns Readmission within the last 30 days:  Yes Current discharge risk:  Lives alone, Dependent with Mobility Barriers to Discharge:  No Barriers Identified   [name], LCSW 12/22/2016, 3:27 PM

## 2016-12-22 NOTE — Evaluation (Signed)
Physical Therapy Evaluation Patient Details Name: Kristen [name] MRN: [id number] DOB: March 05, 1946 Today's Date: 12/22/2016   History of Present Illness  71 yo female admitted with syncope s/p pacemaker placement 7/9. Hx of syncopal episodes with collapse, RBBB, CAD, DM, obesity, anxiety, Hypertension  Clinical Impression  Pt able to state Lt UE precautions post pacemaker insertion. Pt able to ambulate a fair distance today in hall with VCs for use of RW. Pt reports that when she is ambulating her vision is often blurry. She is not sure if this has caused some of her recent falls and reports she is not sure what the cause is of her recent syncopal episodes. Pt presents with deficits listed in PT problem list below and will benefit from continued acute therapy for mobilization, precaution reinforcement during functional activities, and stair training for safe d/c.   Vitals:  SpO2 98% on RA and HR 93 post ambulation with PT    Follow Up Recommendations SNF    Equipment Recommendations  None recommended by PT    Recommendations for Other Services       Precautions / Restrictions Precautions Precautions: Fall;ICD/Pacemaker Precaution Comments: Movement and WB precautions for pacemaker (limit ROM and WB); syncopal episodes Restrictions Weight Bearing Restrictions: Yes      Mobility  Bed Mobility Overal bed mobility: Modified Independent             General bed mobility comments: Mod I with use of bed rail and HOB elevated, increased time   Transfers Overall transfer level: Needs assistance   Transfers: Sit to/from Stand Sit to Stand: Min guard         General transfer comment: Min guard for safety. Pt requires VCs for hand placement on EOB rather than on RW.   Ambulation/Gait Ambulation/Gait assistance: Min guard Ambulation Distance (Feet): 75 Feet Assistive device: Rolling walker (2 wheeled) Gait Pattern/deviations: Step-through pattern;Decreased stride length Gait  velocity: slowed Gait velocity interpretation: Below normal speed for age/gender General Gait Details: pt with decreased stride length and VCs required to stay inside RW during ambulation.   Stairs            Wheelchair Mobility    Modified Rankin (Stroke Patients Only)       Balance Overall balance assessment: Needs assistance Sitting-balance support: Single extremity supported Sitting balance-Leahy Scale: Fair     Standing balance support: Bilateral upper extremity supported;During functional activity Standing balance-Leahy Scale: Poor Standing balance comment: Pt requires use of RW for Bil UE support while standing                              Pertinent Vitals/Pain Pain Assessment: 0-10 Pain Score: 4  Pain Location: Incision site  Pain Descriptors / Indicators: Sore Pain Intervention(s): Monitored during session;Repositioned    Home Living Family/patient expects to be discharged to:: Private residence Living Arrangements: Alone Available Help at Discharge: Other (Comment) (Pt reports she will not be able to have any assistance at d/c) Type of Home: Mobile home Home Access: Stairs to enter Entrance Stairs-Rails: Can reach both Entrance Stairs-Number of Steps: 3 Home Layout: One level Home Equipment:  Agricultural consultant)      Prior Function Level of Independence: Independent with assistive device(s)         Comments: uses rollator for ambulation outside of home     Hand Dominance        Extremity/Trunk Assessment   Upper Extremity Assessment Upper Extremity Assessment:  LUE deficits/detail RUE Deficits / Details: Lt UE limited ROM and use d/t pacemaker precautions     Lower Extremity Assessment Lower Extremity Assessment: Overall WFL for tasks assessed    Cervical / Trunk Assessment Cervical / Trunk Assessment: Normal  Communication   Communication: No difficulties  Cognition Arousal/Alertness: Awake/alert Behavior During Therapy: WFL  for tasks assessed/performed Overall Cognitive Status: Within Functional Limits for tasks assessed                                        General Comments      Exercises     Assessment/Plan    PT Assessment Patient needs continued PT services  PT Problem List Decreased activity tolerance;Decreased balance;Decreased mobility;Cardiopulmonary status limiting activity;Pain       PT Treatment Interventions DME instruction;Gait training;Stair training;Functional mobility training;Therapeutic activities;Therapeutic exercise;Balance training    PT Goals (Current goals can be found in the Care Plan section)  Acute Rehab PT Goals Patient Stated Goal: Walk further PT Goal Formulation: With patient Time For Goal Achievement: 01/05/17 Potential to Achieve Goals: Good    Frequency Min 3X/week   Barriers to discharge        Co-evaluation               AM-PAC PT "6 Clicks" Daily Activity  Outcome Measure Difficulty turning over in bed (including adjusting bedclothes, sheets and blankets)?: None Difficulty moving from lying on back to sitting on the side of the bed? : Total (Pt required use of bed rail ) Difficulty sitting down on and standing up from a chair with arms (e.g., wheelchair, bedside commode, etc,.)?: Total (Pt needs to push up from bed to stand up) Help needed moving to and from a bed to chair (including a wheelchair)?: A Little Help needed walking in hospital room?: A Little Help needed climbing 3-5 steps with a railing? : A Lot 6 Click Score: 14    End of Session Equipment Utilized During Treatment: Gait belt Activity Tolerance: Patient tolerated treatment well Patient left: in chair;with call bell/phone within reach   PT Visit Diagnosis: Unsteadiness on feet (R26.81);Other abnormalities of gait and mobility (R26.89);History of falling (Z91.81);Difficulty in walking, not elsewhere classified (R26.2);Pain Pain - Right/Left: Left Pain - part of  body: Shoulder    Time: 1345-1401 PT Time Calculation (min) (ACUTE ONLY): 16 min   Charges:   PT Evaluation $PT Eval Moderate Complexity: 1 Procedure     PT G CodesElberta Leatherwood, SPT Acute Rehab [place] 12/22/2016, 2:40 PM

## 2016-12-22 NOTE — Progress Notes (Signed)
Inpatient Diabetes Program Recommendations  AACE/ADA: New Consensus Statement on Inpatient Glycemic Control (2015)  Target Ranges:  Prepandial:   less than 140 mg/dL      Peak postprandial:   less than 180 mg/dL (1-2 hours)      Critically ill patients:  140 - 180 mg/dL   Lab Results  Component Value Date   GLUCAP 257 (H) 12/22/2016   HGBA1C 9.8 (H) 10/31/2016    Review of Glycemic Control  Diabetes history: DM2 Outpatient Diabetes medications: Tresiba 65 units + Nov 5-50 units tid + Met 1 gm bid Current orders for Inpatient glycemic control: Lantus 65 units + Novolog meal coverage 10 units tid +Novolog correction 0-9 units tid. + Met 1 gm bid  Inpatient Diabetes Program Recommendations:  Noted continued postprandial hyperglycemia.Please consider increase in meal coverage to Novolog 15 units if eats 50%.  Thank you, Judy E. Hanks, RN, MSN, CDE  Diabetes Coordinator Inpatient Glycemic Control Team Team Pager #[phone] (8am-5pm) 12/22/2016 11:37 AM    

## 2016-12-22 NOTE — NC FL2 (Signed)
[place] MEDICAID FL2 LEVEL OF CARE SCREENING TOOL     IDENTIFICATION  Patient Name: Kristen [name] Birthdate: February 20, 1946 Sex: female Admission Date (Current Location): 12/18/2016  [facility] and [place] Number:  Herbalist and Address:  [facility], [place] [address], [place], [place] [address]      Provider Number: [id number]  Attending Physician Name and Address:  Geradine Girt, DO  Relative Name and Phone Number:  Lajoyce Lauber, Daughter, [phone]     Current Level of Care: Hospital Recommended Level of Care: [place] Prior Approval Number:    Date Approved/Denied:   PASRR Number: [id number]  Discharge Plan: SNF    Current Diagnoses: Patient Active Problem List   Diagnosis Date Noted  . 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 Hypertension   . Hyperglycemia 12/18/2016  . Acute lower UTI 12/12/2016  . Personal history of colonic polyps 02/12/2016  . Hyperkalemia 02/11/2015  . Nasal bone fractures 02/11/2015  . DM (diabetes mellitus), secondary uncontrolled ([place]) 02/11/2015  . Syncope 02/11/2015  . AK (actinic keratosis) 10/21/2013  . Diabetic neuropathy ([place]) 07/24/2013  . Malignant neoplasm of upper-outer quadrant of left female breast ([place]) 11/30/2012  . Vitamin D deficiency 08/24/2012  . RBBB 02/20/2009  . Obesity 02/15/2009  . Anemia 02/15/2009  . DEPRESSION/ANXIETY 02/15/2009  . Essential hypertension 02/15/2009  . CAD (coronary artery disease) 02/15/2009  . GERD 02/15/2009  . Osteoarthritis 02/15/2009  . Hyperlipidemia 02/22/2007    Orientation RESPIRATION BLADDER Height & Weight     Self, Time, Situation, Place  Normal Continent Weight: 193 lb 11.2 oz (87.9 kg) Height:  5\' 2"  (157.5 cm)  BEHAVIORAL SYMPTOMS/MOOD NEUROLOGICAL BOWEL NUTRITION STATUS      Continent Diet (heart healthy/carb modified; please see DC summary)   AMBULATORY STATUS COMMUNICATION OF NEEDS Skin   Limited Assist Verbally Surgical wounds (incisiion left shoulder, adhesive bandage)                       Personal Care Assistance Level of Assistance  Bathing, Feeding, Dressing   Feeding assistance: Independent       Functional Limitations Info  Sight, Hearing, Speech Sight Info: Adequate Hearing Info: Adequate Speech Info: Adequate    SPECIAL CARE FACTORS FREQUENCY  PT (By licensed PT)     PT Frequency: 5x/week              Contractures      Additional Factors Info  Code Status, Allergies, Insulin Sliding Scale Code Status Info: Full Allergies Info: Sulfonamide Derivatives   Insulin Sliding Scale Info: insulin 3x/day       Current Medications (12/22/2016):  This is the current hospital active medication list Current Facility-Administered Medications  Medication Dose Route Frequency Provider Last Rate Last Dose  . acetaminophen (TYLENOL) tablet 650 mg  650 mg Oral Q6H PRN Jani Gravel, MD   650 mg at 12/22/16 8841   Or  . acetaminophen (TYLENOL) suppository 650 mg  650 mg Rectal Q6H PRN Jani Gravel, MD      . amLODipine (NORVASC) tablet 5 mg  5 mg Oral Daily Deboraha Sprang, MD   5 mg at 12/22/16 6606  . aspirin EC tablet 81 mg  81 mg Oral Daily Jani Gravel, MD   81 mg at 12/22/16 3016  . atorvastatin (LIPITOR) tablet 40 mg  40 mg Oral q1800 Jani Gravel, MD   40  mg at 12/21/16 1729  . Chlorhexidine Gluconate Cloth 2 % PADS 6 each  6 each Topical Daily Eulogio Bear U, DO      . exemestane (AROMASIN) tablet 25 mg  25 mg Oral QPC breakfast Jani Gravel, MD   25 mg at 12/22/16 0953  . FLUoxetine (PROZAC) capsule 40 mg  40 mg Oral Daily Jani Gravel, MD   40 mg at 12/22/16 0952  . gabapentin (NEURONTIN) capsule 300 mg  300 mg Oral TID Jani Gravel, MD   300 mg at 12/22/16 2482  . insulin aspart (novoLOG) injection 0-20 Units  0-20 Units Subcutaneous TID WC Vann, Jessica U, DO   11 Units at 12/22/16 1200  . insulin aspart  (novoLOG) injection 0-5 Units  0-5 Units Subcutaneous QHS Eulogio Bear U, DO   2 Units at 12/21/16 2152  . insulin aspart (novoLOG) injection 15 Units  15 Units Subcutaneous TID WC Vann, Jessica U, DO      . insulin glargine (LANTUS) injection 65 Units  65 Units Subcutaneous Q2200 Geradine Girt, DO   65 Units at 12/21/16 2152  . metFORMIN (GLUCOPHAGE) tablet 1,000 mg  1,000 mg Oral BID WC Jani Gravel, MD   1,000 mg at 12/22/16 0804  . mupirocin ointment (BACTROBAN) 2 % 1 application  1 application Nasal BID Eulogio Bear U, DO      . ondansetron (ZOFRAN) injection 4 mg  4 mg Intravenous Q6H PRN Deboraha Sprang, MD      . pantoprazole (PROTONIX) EC tablet 40 mg  40 mg Oral Daily Jani Gravel, MD   40 mg at 12/22/16 0952  . polyethylene glycol (MIRALAX / GLYCOLAX) packet 17 g  17 g Oral Daily PRN Jani Gravel, MD      . sodium chloride flush (NS) 0.9 % injection 3 mL  3 mL Intravenous Q12H Jani Gravel, MD   3 mL at 12/22/16 1000     Discharge Medications: Please see discharge summary for a list of discharge medications.  Relevant Imaging Results:  Relevant Lab Results:   Additional Information SSN: [id number]  [name], LCSW

## 2016-12-22 NOTE — Clinical Social Work Placement (Signed)
   CLINICAL SOCIAL WORK PLACEMENT  NOTE  Date:  12/22/2016  Patient Details  Name: Kristen [name] MRN: [id number] Date of Birth: 1945/11/01  Clinical Social Work is seeking post-discharge placement for this patient at the [place] level of care (*CSW will initial, date and re-position this form in  chart as items are completed):  Yes   Patient/family provided with [facility] Work Department's list of facilities offering this level of care within the geographic area requested by the patient (or if unable, by the patient's family).  Yes   Patient/family informed of their freedom to choose among providers that offer the needed level of care, that participate in Medicare, Medicaid or managed care program needed by the patient, have an available bed and are willing to accept the patient.  Yes   Patient/family informed of [facility]'s ownership interest in [facility] and [facility], as well as of the fact that they are under no obligation to receive care at these facilities.  PASRR submitted to EDS on       PASRR number received on       Existing PASRR number confirmed on 12/22/16     FL2 transmitted to all facilities in geographic area requested by pt/family on       FL2 transmitted to all facilities within larger geographic area on 12/22/16     Patient informed that his/her managed care company has contracts with or will negotiate with certain facilities, including the following:  [facility]     Yes   Patient/family informed of bed offers received.  Patient chooses bed at [facility]     Physician recommends and patient chooses bed at      Patient to be transferred to [facility] on 12/22/16.  Patient to be transferred to facility by [facility]     Patient family notified on 12/22/16 of transfer.  Name of family member notified:  Ursula Beath, daughter     PHYSICIAN       Additional Comment:     _______________________________________________ [name], LCSW 12/22/2016, 4:12 PM

## 2016-12-22 NOTE — Progress Notes (Signed)
Attempted to call report to [facility] at [phone] (pt going to room 1205).  Was forwarded to voicemail.   Transport arrived to take pt to SNF; report given to EMT.  Pt stable and in no acute distress.  AVS printed and given to EMT.

## 2016-12-22 NOTE — Progress Notes (Signed)
Patient will discharge to [facility] Anticipated discharge date: 12/22/16 Family notified: Ursula Beath, daughter Transportation by: [facility]   CSW signing off.  [name], [place]  Clinical Social Worker

## 2016-12-22 NOTE — Discharge Summary (Signed)
Physician Discharge Summary  Shantal Roan YIA:[id number] DOB: 10-28-1945 DOA: 12/18/2016  PCP: Alycia Rossetti, MD  Admit date: 12/18/2016 Discharge date: 12/22/2016   Recommendations for Outpatient Follow-Up:   1. Continue modification of insulin for tighter blood sugar control 2. 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  Diet recommendation: Low sodium, heart healthy.  Carbohydrate-modified.  Wound care: None.   History of Present Illness:    Kristen [name]  is a 71 y.o. female, w CAD, Hypertension, Hyperlipidemia, Dm2, apparently c/o syncope. Pt was sitting in the car seat, in her friends driveway.  She started leaning forward in the passenger seat, started moaning and then passed out.  Maybe a couple of minutes.  No seizure activity noted, no incontinence, no tongue lac.  Pt afterwards c/o slight left sided headache and chest pain.  Pt states left sided, radiation to the left arm.  Pt notes that she has had chest pain for the past 1 week.  It occurs intermittently.  Nothing appears to make it better or worse.    Pt was recently admitted on 7/2 for similar complaint of syncope. Trop negative.  Cardiac echo EF wnl.  Aortic sclerosis.   Hospital Course by Problem:   Syncope MRI brain: no acute process EEG: normal Cardiac echo 12/13/2016 S/p pacemaker for  Baseline conduction system disease with RBBB, LAFB Recommend outpatient evaluation for OSA  Cp Ce negative  Dehydration as evident by Bun>20 Hydrated  Urine culture with multiple species -d/c abx  Dm2 uncontrolled. Hga1c =9.8 (11/01/2016) Increase meal coverage and lantus  Diabetic  neuropathy Cont gabapentin- patient takes PRN  Hx of breast cancer Continue aromasin  Anemia trend  CAD  Cont aspirin , Lipitor, amlodipine    Medical Consultants:    Cards/EP   Discharge Exam:   Vitals:   12/22/16 1300 12/22/16 1410  BP: 128/88   Pulse: 72 93  Resp: 15   Temp: 98.3 F (36.8 C)    Vitals:   12/22/16 0500 12/22/16 0810 12/22/16 1300 12/22/16 1410  BP: 112/69 130/86 128/88   Pulse:   72 93  Resp: _0 Temp: 98 F (36.7 C)  98.3 F (36.8 C)   TempSrc: Oral  Oral   SpO2: 95% 96% 97% 98%  Weight:      Height:        Gen:  NAD- flat affect   The results of significant diagnostics from this hospitalization (including imaging, microbiology, ancillary and laboratory) are listed below for reference.     Procedures and Diagnostic Studies:   Dg Chest 2 View  Result Date: 12/18/2016 CLINICAL DATA:  Syncopal episode EXAM: CHEST  2 VIEW COMPARISON:  12/12/2016 FINDINGS: No focal pulmonary infiltrate, consolidation or effusion. Borderline cardiomegaly. No pneumothorax. Calcified granuloma in the left upper lobe. Left axillary surgical clips. IMPRESSION: Borderline cardiomegaly.  No edema or infiltrate. Electronically Signed   By: Donavan Foil M.D.   On: 12/18/2016 18:06   Mr Brain Wo Contrast  Result Date: 12/18/2016 CLINICAL DATA:  Initial evaluation for acute syncope. EXAM: MRI HEAD WITHOUT CONTRAST TECHNIQUE: Multiplanar, multiecho pulse sequences of the brain and surrounding structures were obtained without  intravenous contrast. COMPARISON:  Prior CT from 12/12/2016. FINDINGS: Brain: Mild diffuse prominence of the CSF containing spaces is compatible with generalized cerebral atrophy. Minimal patchy T2/FLAIR hyperintensity within the supratentorial cerebral white matter noted, nonspecific, but felt to be within normal limits for age. No abnormal foci of restricted diffusion to suggest acute or subacute ischemia. Gray-white matter differentiation  maintained. Few tiny remote left cerebellar infarcts noted. No other evidence for chronic infarction. No evidence for acute or chronic intracranial hemorrhage. No mass lesion, midline shift or mass effect. Ventricles normal size without evidence for hydrocephalus. No extra-axial fluid collection. Major dural sinuses are grossly patent. Pituitary gland suprasellar region within normal limits. Vascular: Major intracranial vascular flow voids are maintained. Left vertebral artery hypoplastic. Skull and upper cervical spine: Craniocervical junction within normal limits. Visualized upper cervical spine unremarkable. Bone marrow signal intensity within normal limits. No scalp soft tissue abnormality. Sinuses/Orbits: Globes and orbital soft tissues within normal limits. Paranasal sinuses are clear. No mastoid effusion. Inner ear structures normal. IMPRESSION: 1. No acute intracranial process identified. 2. Few small remote left cerebellar infarcts. 3. Otherwise normal brain MRI for age. Electronically Signed   By: Jeannine Boga M.D.   On: 12/18/2016 23:04     Labs:   Basic Metabolic Panel:  Recent Labs Lab 12/18/16 1729 12/19/16 0418 12/21/16 0423 12/22/16 0257  NA 137 140 134* 135  K 4.6 3.8 4.3 4.1  CL 102 104 100* 99*  CO2 _0 GLUCOSE 339* 213* 335* 242*  BUN 21* 18 19 22*  CREATININE 1.02* 0.85 0.99 1.14*  CALCIUM 9.3 9.1 9.3 9.2   GFR Estimated Creatinine Clearance: 46.6 mL/min (A) (by C-G formula based on SCr of 1.14 mg/dL (H)). Liver Function Tests:  Recent Labs Lab 12/19/16 0418  AST 23  ALT 36  ALKPHOS 69  BILITOT 0.5  PROT 6.7  ALBUMIN 3.4*   No results for input(s): LIPASE, AMYLASE in the last 168 hours. No results for input(s): AMMONIA in the last 168 hours. Coagulation profile No results for input(s): INR, PROTIME in the last 168 hours.  CBC:  Recent Labs Lab 12/18/16 1729 12/19/16 0418 12/21/16 0423  WBC 8.0 7.3 8.0  HGB 11.8* 11.9* 12.5  HCT  37.7 37.8 40.8  MCV 89.1 88.3 88.9  PLT 263 252 243   Cardiac Enzymes:  Recent Labs Lab 12/18/16 2344 12/19/16 0418 12/19/16 0903  TROPONINI <0.03 <0.03 <0.03   BNP: Invalid input(s): POCBNP CBG:  Recent Labs Lab 12/21/16 1246 12/21/16 1557 12/21/16 2111 12/22/16 0720 12/22/16 1105  GLUCAP 390* 248* 248* 265* 257*   D-Dimer No results for input(s): DDIMER in the last 72 hours. Hgb A1c No results for input(s): HGBA1C in the last 72 hours. Lipid Profile No results for input(s): CHOL, HDL, LDLCALC, TRIG, CHOLHDL, LDLDIRECT in the last 72 hours. Thyroid function studies No results for input(s): TSH, T4TOTAL, T3FREE, THYROIDAB in the last 72 hours.  Invalid input(s): FREET3 Anemia work up No results for input(s): VITAMINB12, FOLATE, FERRITIN, TIBC, IRON, RETICCTPCT in the last 72 hours. Microbiology Recent Results (from the past 240 hour(s))  Urine culture     Status: Abnormal   Collection Time: 12/12/16  7:56 PM  Result Value Ref Range Status   Specimen Description URINE, CLEAN CATCH  Final   Special Requests NONE  Final   Culture MULTIPLE SPECIES PRESENT, SUGGEST RECOLLECTION (A)  Final   Report Status 12/14/2016 FINAL  Final  Culture, Urine     Status:  Abnormal   Collection Time: 12/18/16  6:52 PM  Result Value Ref Range Status   Specimen Description URINE, RANDOM  Final   Special Requests Normal  Final   Culture MULTIPLE ORGANISMS PRESENT, NONE PREDOMINANT (A)  Final   Report Status 12/20/2016 FINAL  Final  Surgical pcr screen     Status: Abnormal   Collection Time: 12/19/16  2:44 PM  Result Value Ref Range Status   MRSA, PCR NEGATIVE NEGATIVE Final   Staphylococcus aureus POSITIVE (A) NEGATIVE Final    Comment:        The Xpert SA Assay (FDA approved for NASAL specimens in patients over 68 years of age), is one component of a comprehensive surveillance program.  Test performance has been validated by [facility] for patients greater than or equal to  34 year old. It is not intended to diagnose infection nor to guide or monitor treatment.      Discharge Instructions:   Discharge Instructions    Diet - low sodium heart healthy    Complete by:  As directed    Diet Carb Modified    Complete by:  As directed    Increase activity slowly    Complete by:  As directed      Allergies as of 12/22/2016      Reactions   Sulfonamide Derivatives Hives      Medication List    STOP taking these medications   insulin aspart 100 UNIT/ML FlexPen Commonly known as:  NOVOLOG FLEXPEN Replaced by:  insulin aspart 100 UNIT/ML injection   insulin degludec 100 UNIT/ML Sopn FlexTouch Pen Commonly known as:  TRESIBA FLEXTOUCH Replaced by:  insulin glargine 100 UNIT/ML injection   UNIFINE PENTIPS 31G X 6 MM Misc Generic drug:  Insulin Pen Needle     TAKE these medications   amLODipine 5 MG tablet Commonly known as:  NORVASC Take 1 tablet (5 mg total) by mouth daily. What changed:  medication strength  how much to take   aspirin EC 81 MG tablet Take 81 mg by mouth daily.   atorvastatin 40 MG tablet Commonly known as:  LIPITOR TAKE 1 TABLET BY MOUTH DAILY AT 6 PM What changed:  how much to take  how to take this  when to take this  additional instructions   blood glucose meter kit and supplies Kit Dispense based on patient and insurance preference. Use to monitor FSBS 3x daily. Dx: E11.41.   exemestane 25 MG tablet Commonly known as:  AROMASIN Take 1 tablet (25 mg total) by mouth daily after breakfast.   FLUoxetine 40 MG capsule Commonly known as:  PROZAC Take 1 capsule (40 mg total) by mouth daily.   gabapentin 300 MG capsule Commonly known as:  NEURONTIN Take 1 capsule (300 mg total) by mouth 3 (three) times daily as needed (nerve pain).   glucose blood test strip Dispense based on patient and insurance preference. Use to monitor FSBS 3x daily. Dx: E11.41.   insulin aspart 100 UNIT/ML injection Commonly known  as:  novoLOG Inject 0-20 Units into the skin 3 (three) times daily with meals. Replaces:  insulin aspart 100 UNIT/ML FlexPen   insulin aspart 100 UNIT/ML injection Commonly known as:  novoLOG Inject 0-5 Units into the skin at bedtime.   insulin aspart 100 UNIT/ML injection Commonly known as:  novoLOG Inject 15 Units into the skin 3 (three) times daily with meals.   insulin glargine 100 UNIT/ML injection Commonly known as:  LANTUS Inject 0.65 mLs (  65 Units total) into the skin daily at 10 pm. Replaces:  insulin degludec 100 UNIT/ML Sopn FlexTouch Pen   Lancets Misc Dispense based on patient and insurance preference. Use to monitor FSBS 3x daily. Dx: E11.41.   metFORMIN 1000 MG tablet Commonly known as:  GLUCOPHAGE TAKE 1 TABLET BY MOUTH 2 TIMES DAILY WITH A MEAL. What changed:  how much to take  how to take this  when to take this  additional instructions   omeprazole 20 MG capsule Commonly known as:  PRILOSEC TAKE 1 CAPSULE BY MOUTH 2 TIMES DAILY. What changed:  how much to take  how to take this  when to take this  additional instructions   oxybutynin 5 MG 24 hr tablet Commonly known as:  DITROPAN-XL Take 1 tablet (5 mg total) by mouth at bedtime.   polyethylene glycol packet Commonly known as:  MIRALAX / GLYCOLAX Take 17 g by mouth daily as needed for mild constipation. What changed:  additional instructions      Follow-up Information    [facility] Follow up on 01/02/2017.   Specialty:  Cardiology Why:  4:30pm, wound check Contact information: [address] [place]       Deboraha Sprang, MD Follow up on 03/31/2017.   Specialty:  Cardiology Why:  12:00PM (noon) Contact information: [address] [place] [address] [phone]            Time coordinating discharge: 35 min  Signed:  Zeya Balles Alison Stalling   [facility] 12/22/2016, 3:26  PM

## 2016-12-23 DIAGNOSIS — E785 Hyperlipidemia, unspecified: Secondary | ICD-10-CM | POA: Diagnosis not present

## 2016-12-23 DIAGNOSIS — R55 Syncope and collapse: Secondary | ICD-10-CM | POA: Diagnosis not present

## 2016-12-23 DIAGNOSIS — I2510 Atherosclerotic heart disease of native coronary artery without angina pectoris: Secondary | ICD-10-CM | POA: Diagnosis not present

## 2016-12-23 DIAGNOSIS — E119 Type 2 diabetes mellitus without complications: Secondary | ICD-10-CM | POA: Diagnosis not present

## 2016-12-29 DIAGNOSIS — E119 Type 2 diabetes mellitus without complications: Secondary | ICD-10-CM | POA: Diagnosis not present

## 2016-12-29 DIAGNOSIS — I2510 Atherosclerotic heart disease of native coronary artery without angina pectoris: Secondary | ICD-10-CM | POA: Diagnosis not present

## 2016-12-29 DIAGNOSIS — I10 Essential (primary) hypertension: Secondary | ICD-10-CM | POA: Diagnosis not present

## 2016-12-29 DIAGNOSIS — N3941 Urge incontinence: Secondary | ICD-10-CM | POA: Diagnosis not present

## 2016-12-30 ENCOUNTER — Encounter: Payer: Self-pay | Admitting: Family Medicine

## 2017-01-02 ENCOUNTER — Ambulatory Visit: Payer: Medicare Other

## 2017-01-03 ENCOUNTER — Encounter: Payer: Self-pay | Admitting: Family Medicine

## 2017-01-03 ENCOUNTER — Ambulatory Visit (INDEPENDENT_AMBULATORY_CARE_PROVIDER_SITE_OTHER): Payer: Medicare Other | Admitting: Family Medicine

## 2017-01-03 VITALS — BP 128/70 | HR 76 | Temp 98.3°F | Resp 14 | Ht 63.0 in | Wt 197.0 lb

## 2017-01-03 DIAGNOSIS — I2510 Atherosclerotic heart disease of native coronary artery without angina pectoris: Secondary | ICD-10-CM

## 2017-01-03 DIAGNOSIS — G4730 Sleep apnea, unspecified: Secondary | ICD-10-CM

## 2017-01-03 DIAGNOSIS — E1365 Other specified diabetes mellitus with hyperglycemia: Secondary | ICD-10-CM

## 2017-01-03 DIAGNOSIS — Z950 Presence of cardiac pacemaker: Secondary | ICD-10-CM

## 2017-01-03 DIAGNOSIS — IMO0002 Reserved for concepts with insufficient information to code with codable children: Secondary | ICD-10-CM

## 2017-01-03 HISTORY — DX: Presence of cardiac pacemaker: Z95.0

## 2017-01-03 NOTE — Assessment & Plan Note (Addendum)
Continue current regimen Tresiba 65 units, Novolog SSI with meals We called [name] they will fax me the blood sugar readings Continue metformin

## 2017-01-03 NOTE — Progress Notes (Signed)
   Subjective:    Patient ID: Kristen [name], female    DOB: 13-Jan-1946, 71 y.o.   MRN: [id number]  Patient presents for Hospital F/U (syncope- has had pacemaker inserted)   Pt here for Hospital follow up, still living at [facility] until the end of the month. 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 Was admitted seconary to Syncope, had pace maker Placed secondary to bundle branch block as well as fascicular block. She had a similar episode a couple months ago. Only other medication change was she was placed on sliding cell insulin as she was transitioned to the skilled nursing facility. She did have MRI EEG which were negative she had urine culture chest x-ray did not show any evidence of infection causing her syncopal event. She states that she feels well now. She is eating well at the nursing home she is getting up doing her activities. She is ready to transition back home. It was also noted that in the discharge summary to be evaluated for possible obstructive sleep apnea  DM- A1C 9.8%, Tresiba 65 units, Novolog with each meal sliding scale she states that her blood sugar was 174 before she had some crackers today but states that they have been good she's not had any hypoglycemia symptoms.  Had mild renal insuffiencey due for recheck on her renal function       Review Of Systems:  GEN- denies fatigue, fever, weight loss,weakness, recent illness HEENT- denies eye drainage, change in vision, nasal discharge, CVS- denies chest pain, palpitations RESP- denies SOB, cough, wheeze ABD- denies N/V, change in stools, abd pain GU- denies dysuria, hematuria, dribbling, incontinence MSK- denies joint pain, muscle aches, injury Neuro- denies headache, dizziness, syncope, seizure activity       Objective:    BP 128/70   Pulse 76   Temp 98.3 F (36.8 C) (Oral)   Resp 14   Ht 5\' 3"  (1.6 m)   Wt 197 lb (89.4 kg)   SpO2 97%   BMI 34.90 kg/m  GEN- NAD, alert and oriented x3 HEENT- PERRL, EOMI, non  injected sclera, pink conjunctiva, MMM, oropharynx clear Neck- Supple, no thyromegaly CVS- RRR, no murmur RESP-CTAB ABD-NABS,soft,NT,ND EXT- No edema Pulses- Radial, DP- 2+        Assessment & Plan:      Problem List Items Addressed This Visit    CAD (coronary artery disease) - Primary   Relevant Orders   Basic metabolic panel   S/P placement of cardiac pacemaker    F/u CARDIOLOGY Continue current medications No concerns today Check renal function      DM (diabetes mellitus), secondary uncontrolled (HCC)    Continue current regimen Tresiba 65 units, Novolog SSI with meals [facility] they will fax me the blood sugar readings Continue metformin       Other Visit Diagnoses    Sleep apnea, unspecified type       ? OSA, will have evaluated once discharged from SNF      Note: This dictation was prepared with Dragon dictation along with smaller phrase technology. Any transcriptional errors that result from this process are unintentional.

## 2017-01-03 NOTE — Patient Instructions (Addendum)
We will call with kidney levels Continue insulin 65 units Tresiba and sliding scale  APpt tomorrow with [facility]  At 4:30pm [phone]) F/U as previous in Sept

## 2017-01-03 NOTE — Assessment & Plan Note (Signed)
F/u CARDIOLOGY Continue current medications No concerns today Check renal function

## 2017-01-04 ENCOUNTER — Ambulatory Visit (INDEPENDENT_AMBULATORY_CARE_PROVIDER_SITE_OTHER): Payer: Medicare Other | Admitting: *Deleted

## 2017-01-04 DIAGNOSIS — R55 Syncope and collapse: Secondary | ICD-10-CM | POA: Diagnosis not present

## 2017-01-04 DIAGNOSIS — I453 Trifascicular block: Secondary | ICD-10-CM | POA: Diagnosis not present

## 2017-01-04 DIAGNOSIS — Z950 Presence of cardiac pacemaker: Secondary | ICD-10-CM | POA: Diagnosis not present

## 2017-01-04 LAB — BASIC METABOLIC PANEL
BUN: 24 mg/dL (ref 7–25)
CALCIUM: 9.7 mg/dL (ref 8.6–10.4)
CO2: 26 mmol/L (ref 20–31)
Chloride: 98 mmol/L (ref 98–110)
Creat: 0.89 mg/dL (ref 0.60–0.93)
GLUCOSE: 115 mg/dL — AB (ref 70–99)
Potassium: 4.9 mmol/L (ref 3.5–5.3)
Sodium: 137 mmol/L (ref 135–146)

## 2017-01-05 LAB — CUP PACEART INCLINIC DEVICE CHECK
Brady Statistic RA Percent Paced: 46 %
Implantable Lead Implant Date: 20180709
Implantable Lead Location: 753859
Implantable Pulse Generator Implant Date: 20180709
Lead Channel Pacing Threshold Amplitude: 0.8 V
Lead Channel Pacing Threshold Pulse Width: 0.4 ms
Lead Channel Pacing Threshold Pulse Width: 0.4 ms
Lead Channel Sensing Intrinsic Amplitude: 15.6 mV
Lead Channel Setting Pacing Amplitude: 3 V
Lead Channel Setting Pacing Amplitude: 3 V
Lead Channel Setting Pacing Pulse Width: 0.4 ms
MDC IDC LEAD IMPLANT DT: 20180709
MDC IDC LEAD LOCATION: 753860
MDC IDC MSMT LEADCHNL RA IMPEDANCE VALUE: 390 Ohm
MDC IDC MSMT LEADCHNL RA PACING THRESHOLD AMPLITUDE: 0.7 V
MDC IDC MSMT LEADCHNL RA SENSING INTR AMPL: 6.6 mV
MDC IDC MSMT LEADCHNL RV IMPEDANCE VALUE: 721 Ohm
MDC IDC SESS DTM: 20180725202700
MDC IDC STAT BRADY RV PERCENT PACED: 7 %
Pulse Gen Serial Number: 69106657

## 2017-01-05 NOTE — Progress Notes (Signed)
Wound check appointment. Steri-strips removed. Wound without redness or edema. Incision edges approximated, wound well healed. Normal device function. Thresholds, sensing, and impedances consistent with implant measurements. Device programmed at 3.0V for extra safety margin until 3 month visit. Histogram distribution appropriate for patient and level of activity. No mode switches or high ventricular rates noted. Patient educated about wound care, arm mobility, lifting restrictions, and home monitor. ROV with SK on 03/31/17.

## 2017-01-13 ENCOUNTER — Telehealth: Payer: Self-pay | Admitting: Family Medicine

## 2017-01-13 NOTE — Telephone Encounter (Signed)
Kristen [name] called to let us know pt has been discharged from assisted living and wanted to know if [facility] would approve for the pt to receive services for OT and PT from [facility].

## 2017-01-17 NOTE — Telephone Encounter (Signed)
Call placed to [facility]. VO given for [facility] OT/ PT to eval.

## 2017-01-17 NOTE — Telephone Encounter (Signed)
Call placed to contact number and noted it to be a disney vacation line.   Call placed to [facility] to inquire. [phone]~ telephone. Staff in meeting at this time, [facility].

## 2017-01-17 NOTE — Telephone Encounter (Signed)
Agree with above 

## 2017-01-18 DIAGNOSIS — I2510 Atherosclerotic heart disease of native coronary artery without angina pectoris: Secondary | ICD-10-CM | POA: Diagnosis not present

## 2017-01-18 DIAGNOSIS — C50912 Malignant neoplasm of unspecified site of left female breast: Secondary | ICD-10-CM | POA: Diagnosis not present

## 2017-01-18 DIAGNOSIS — E1165 Type 2 diabetes mellitus with hyperglycemia: Secondary | ICD-10-CM | POA: Diagnosis not present

## 2017-01-18 DIAGNOSIS — Z794 Long term (current) use of insulin: Secondary | ICD-10-CM | POA: Diagnosis not present

## 2017-01-18 DIAGNOSIS — Z950 Presence of cardiac pacemaker: Secondary | ICD-10-CM | POA: Diagnosis not present

## 2017-01-18 DIAGNOSIS — Z9012 Acquired absence of left breast and nipple: Secondary | ICD-10-CM | POA: Diagnosis not present

## 2017-01-18 DIAGNOSIS — E1141 Type 2 diabetes mellitus with diabetic mononeuropathy: Secondary | ICD-10-CM | POA: Diagnosis not present

## 2017-01-18 DIAGNOSIS — R531 Weakness: Secondary | ICD-10-CM | POA: Diagnosis not present

## 2017-01-18 DIAGNOSIS — Z48812 Encounter for surgical aftercare following surgery on the circulatory system: Secondary | ICD-10-CM | POA: Diagnosis not present

## 2017-01-18 DIAGNOSIS — Z79811 Long term (current) use of aromatase inhibitors: Secondary | ICD-10-CM | POA: Diagnosis not present

## 2017-01-24 ENCOUNTER — Telehealth: Payer: Self-pay | Admitting: Family Medicine

## 2017-01-24 DIAGNOSIS — R531 Weakness: Secondary | ICD-10-CM | POA: Diagnosis not present

## 2017-01-24 DIAGNOSIS — Z48812 Encounter for surgical aftercare following surgery on the circulatory system: Secondary | ICD-10-CM | POA: Diagnosis not present

## 2017-01-24 DIAGNOSIS — E1141 Type 2 diabetes mellitus with diabetic mononeuropathy: Secondary | ICD-10-CM | POA: Diagnosis not present

## 2017-01-24 DIAGNOSIS — I2510 Atherosclerotic heart disease of native coronary artery without angina pectoris: Secondary | ICD-10-CM | POA: Diagnosis not present

## 2017-01-24 DIAGNOSIS — Z950 Presence of cardiac pacemaker: Secondary | ICD-10-CM | POA: Diagnosis not present

## 2017-01-24 DIAGNOSIS — E1165 Type 2 diabetes mellitus with hyperglycemia: Secondary | ICD-10-CM | POA: Diagnosis not present

## 2017-01-24 NOTE — Telephone Encounter (Signed)
MD to be made aware.  

## 2017-01-24 NOTE — Telephone Encounter (Signed)
CB# [phone]  He states that she no longer needs Occupational Therapy after the evaluation. She is good with the EDL and doing around the house.  She will continue to see physical therapist.

## 2017-01-24 NOTE — Telephone Encounter (Signed)
noted 

## 2017-01-27 DIAGNOSIS — R531 Weakness: Secondary | ICD-10-CM | POA: Diagnosis not present

## 2017-01-27 DIAGNOSIS — E1165 Type 2 diabetes mellitus with hyperglycemia: Secondary | ICD-10-CM | POA: Diagnosis not present

## 2017-01-27 DIAGNOSIS — Z950 Presence of cardiac pacemaker: Secondary | ICD-10-CM | POA: Diagnosis not present

## 2017-01-27 DIAGNOSIS — E1141 Type 2 diabetes mellitus with diabetic mononeuropathy: Secondary | ICD-10-CM | POA: Diagnosis not present

## 2017-01-27 DIAGNOSIS — I2510 Atherosclerotic heart disease of native coronary artery without angina pectoris: Secondary | ICD-10-CM | POA: Diagnosis not present

## 2017-01-27 DIAGNOSIS — Z48812 Encounter for surgical aftercare following surgery on the circulatory system: Secondary | ICD-10-CM | POA: Diagnosis not present

## 2017-01-31 ENCOUNTER — Telehealth: Payer: Self-pay | Admitting: *Deleted

## 2017-01-31 DIAGNOSIS — Z950 Presence of cardiac pacemaker: Secondary | ICD-10-CM | POA: Diagnosis not present

## 2017-01-31 DIAGNOSIS — I2510 Atherosclerotic heart disease of native coronary artery without angina pectoris: Secondary | ICD-10-CM | POA: Diagnosis not present

## 2017-01-31 DIAGNOSIS — E1165 Type 2 diabetes mellitus with hyperglycemia: Secondary | ICD-10-CM | POA: Diagnosis not present

## 2017-01-31 DIAGNOSIS — Z48812 Encounter for surgical aftercare following surgery on the circulatory system: Secondary | ICD-10-CM | POA: Diagnosis not present

## 2017-01-31 DIAGNOSIS — R531 Weakness: Secondary | ICD-10-CM | POA: Diagnosis not present

## 2017-01-31 DIAGNOSIS — E1141 Type 2 diabetes mellitus with diabetic mononeuropathy: Secondary | ICD-10-CM | POA: Diagnosis not present

## 2017-01-31 NOTE — Telephone Encounter (Signed)
noted 

## 2017-01-31 NOTE — Telephone Encounter (Signed)
Received call from [place], [facility] PT with [facility] [phone] telephone.   Reports that patient has met all goals and will be D/C'ed from services.   MD to be made aware.

## 2017-02-01 ENCOUNTER — Other Ambulatory Visit: Payer: Self-pay | Admitting: *Deleted

## 2017-02-01 MED ORDER — OXYBUTYNIN CHLORIDE ER 5 MG PO TB24: 5.0000 mg | ORAL_TABLET | Freq: Every day | ORAL | 1 refills | Status: DC

## 2017-02-01 NOTE — Telephone Encounter (Signed)
Patient requested refill on Oxybutynin.   Prescription sent to pharmacy.

## 2017-02-06 ENCOUNTER — Telehealth: Payer: Self-pay | Admitting: Cardiology

## 2017-02-06 ENCOUNTER — Ambulatory Visit (INDEPENDENT_AMBULATORY_CARE_PROVIDER_SITE_OTHER): Payer: Self-pay | Admitting: *Deleted

## 2017-02-06 DIAGNOSIS — R55 Syncope and collapse: Secondary | ICD-10-CM

## 2017-02-06 DIAGNOSIS — Z950 Presence of cardiac pacemaker: Secondary | ICD-10-CM

## 2017-02-06 NOTE — Telephone Encounter (Signed)
Patient called and stated that she can feel some stiches poking out of her device site. After consulting w/ Device Tech RN informed pt the RN will call her back. Pt verbalized understanding.

## 2017-02-06 NOTE — Progress Notes (Signed)
Stitch removed from left lateral incision edge, otherwise incision well healed, no redness or edema. Antibiotic ointment and band-aid applied,

## 2017-02-06 NOTE — Telephone Encounter (Signed)
Spoke with pt, pt stated that it feels like something is pulling over her device site when she puts a shirt on, pt denied redness or swelling at device site, pt agreeable to appointment at 1130 to have device site assessed.

## 2017-02-08 ENCOUNTER — Other Ambulatory Visit: Payer: Self-pay | Admitting: *Deleted

## 2017-02-08 DIAGNOSIS — G4733 Obstructive sleep apnea (adult) (pediatric): Secondary | ICD-10-CM

## 2017-03-03 ENCOUNTER — Ambulatory Visit: Payer: Medicare Other | Admitting: Family Medicine

## 2017-03-08 ENCOUNTER — Encounter: Payer: Self-pay | Admitting: Family Medicine

## 2017-03-14 ENCOUNTER — Encounter: Payer: Self-pay | Admitting: Neurology

## 2017-03-14 ENCOUNTER — Other Ambulatory Visit: Payer: Self-pay | Admitting: Neurology

## 2017-03-14 ENCOUNTER — Ambulatory Visit (INDEPENDENT_AMBULATORY_CARE_PROVIDER_SITE_OTHER): Payer: Medicare Other | Admitting: Neurology

## 2017-03-14 VITALS — BP 123/71 | HR 71 | Ht 62.0 in | Wt 201.0 lb

## 2017-03-14 DIAGNOSIS — E6609 Other obesity due to excess calories: Secondary | ICD-10-CM

## 2017-03-14 DIAGNOSIS — R0683 Snoring: Secondary | ICD-10-CM | POA: Diagnosis not present

## 2017-03-14 DIAGNOSIS — Z6836 Body mass index (BMI) 36.0-36.9, adult: Secondary | ICD-10-CM

## 2017-03-14 DIAGNOSIS — K000 Anodontia: Secondary | ICD-10-CM | POA: Diagnosis not present

## 2017-03-14 DIAGNOSIS — G4733 Obstructive sleep apnea (adult) (pediatric): Secondary | ICD-10-CM | POA: Diagnosis not present

## 2017-03-14 DIAGNOSIS — K08109 Complete loss of teeth, unspecified cause, unspecified class: Secondary | ICD-10-CM

## 2017-03-14 NOTE — Progress Notes (Signed)
SLEEP MEDICINE CLINIC   Provider:  Larey Seat, [place] D  Primary Care Physician:  Alycia Rossetti, MD   Referring Provider: Alycia Rossetti, MD    Chief Complaint  Patient presents with  . New Patient (Initial Visit)    pt with daughter, rm 64, pt had a pacemaker placed and they are wanting to make sure she didnt have any underlying sleep issue.    HPI:  Kristen [name] is a 71 y.o. female , seen here as in a referral from Dr. Buelah Manis for evaluation of possible OSA ,  According to Dr. Dorian Heckle note Mrs. mask ends presented for a follow-up after hospitalization on 01/03/2017. The patient had been hospitalized after a syncope was found to have bradycardia and a pacemaker was inserted. By the time in late July she was still staying at [facility] for rehabilitation. 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 During the rehabilitation stay she was also placed on sliding scale insulin. Her appetite was good she seems to sleep well and rests well. She does not think that she snores but her family does agree. She was diagnosed with coronary artery disease, fascicular block, heart block, status post cardiac pacemaker placement, diabetes mellitus type 2 uncontrolled.  Chief complaint according to patient : no trouble falling asleep, but staying asleep- nocturia.   Sleep habits are as follows: The patient's usual bedtime is between 9:30 and 10 PM, she describes her bedroom is quiet, cool and dark. The evening usually concludes was watching TV. The patient sleeps on either side but reportedly does not tolerate supine sleep well. Since she's taking a fluid pill and she has no more bathroom breaks which interrupts her sleep. She sleeps on 2 pillows, she does not have a set rise time and reports waking up and getting up at anytime between 8:30 and 9:30. The patient  estimates that she gets 8 hours of nocturnal sleep and sometimes more. She does not report vivid dreams or disturbing dreams. She sometimes naps during the day, but not regularly.  Sleep medical history and family sleep history:  Father was a loud snorer. Father had CAD, died of MI. Mother had cancer - died at age 40. No history of sleep walking or talking.   Social history:  Widowed, adult children. One daughter living. No tobacco use, no ETOH use, but drinks soda and iced tea-caffeine 2 glasses at night.    Review of Systems: Out of a complete 14 system review, the patient complains of only the following symptoms, and all other reviewed systems are negative. Snoring, no apnea witnessed, nocturia. Daytime sleepiness - occassionally naps.  Epworth score  4 , Fatigue severity score 21 , depression score n/a   Social History   Social History  . Marital status: Widowed    Spouse name: N/A  . Number of children: 2  . Years of education: N/A   Occupational History  . Not on file.   Social History Main Topics  . Smoking status: Never Smoker  . Smokeless tobacco: Never Used  . Alcohol use No  . Drug use: No  . Sexual activity: Not on file   Other Topics Concern  . Not on file   Social History Narrative   Lives in [place] with daughter.   Retired    Family History  Problem Relation Age of  Onset  . Cancer Mother        mouth cancer  . Breast cancer Mother        possibly dx in her 3s  . Lung cancer Brother 55  . Lung cancer Brother   . Lung cancer Brother   . Heart attack Father 74  . Breast cancer Sister   . Brain cancer Brother   . Colon cancer Neg Hx   . Colon polyps Neg Hx   . Rectal cancer Neg Hx   . Stomach cancer Neg Hx   . Esophageal cancer Neg Hx     Past Medical History:  Diagnosis Date  . Allergy   . Anxiety   . Arthritis   . Bifascicular block   . Blood transfusion without reported diagnosis   . Breast cancer ([place])    breast-left   . Coronary  artery disease, non-occlusive    a. Left heart cath 01/2009 showed 40% LAD, 40% D2, 50% distal LAD, 20-30% AV cx, 20-30% luminal Cx, 30% prox RCA, 40-50% mRCA, LVEF normal. b. Intermediate risk nuc 2014 - ? breast attn versus small area of reversible ischemia, EF 74%.  . Depression   . Diabetes mellitus   . GERD (gastroesophageal reflux disease)   . Hemorrhoids   . Hyperlipidemia   . Hypertension   . Nephrolithiasis   . Obesity   . Overactive bladder   . Stroke [facility])    a. MRI 12/2016 - remote left cerebellar infarcts incidentally noted.  . Vitamin D deficiency     Past Surgical History:  Procedure Laterality Date  . APPENDECTOMY    . BACK SURGERY    . BREAST SURGERY    . CARDIAC CATHETERIZATION  01/2009   cath by Dr Lia Foyer revealed nonobstructive CAD  . CESAREAN SECTION    . CHOLECYSTECTOMY    . CHOLECYSTECTOMY, LAPAROSCOPIC    . COLONOSCOPY     last 2012- with polyps  . JOINT REPLACEMENT    . LITHOTRIPSY    . MASTECTOMY W/ SENTINEL NODE BIOPSY Left 12/24/2012   Procedure: LEFT MASTECTOMY WITH LEFT SENTINEL LYMPH NODE BIOPSY;  Surgeon: Rolm Bookbinder, MD;  Location: [place];  Service: General;  Laterality: Left;  . OOPHORECTOMY Right    1.5  ovaries rem  . PACEMAKER IMPLANT N/A 12/19/2016   Procedure: Pacemaker Implant;  Surgeon: Deboraha Sprang, MD;  Location: [place] [facility];  Service: Cardiovascular;  Laterality: N/A;  . PARTIAL HIP ARTHROPLASTY     left hip  . POLYPECTOMY    . REPLACEMENT TOTAL KNEE     both knees  . TONSILLECTOMY    . TOTAL MASTECTOMY Left 12/24/2012   Dr Donne Hazel    Current Outpatient Prescriptions  Medication Sig Dispense Refill  . amLODipine (NORVASC) 5 MG tablet Take 1 tablet (5 mg total) by mouth daily.    Marland Kitchen aspirin EC 81 MG tablet Take 81 mg by mouth daily.    Marland Kitchen atorvastatin (LIPITOR) 40 MG tablet TAKE 1 TABLET BY MOUTH DAILY AT 6 PM (Patient taking differently: Take 40 mg by mouth at bedtime. ) 90 tablet 2  . blood glucose meter kit and  supplies KIT Dispense based on patient and insurance preference. Use to monitor FSBS 3x daily. Dx: E11.41. 1 each 0  . exemestane (AROMASIN) 25 MG tablet Take 1 tablet (25 mg total) by mouth daily after breakfast. 90 tablet 3  . FLUoxetine (PROZAC) 40 MG capsule Take 1 capsule (40 mg total) by mouth daily.  90 capsule 2  . gabapentin (NEURONTIN) 300 MG capsule Take 1 capsule (300 mg total) by mouth 3 (three) times daily as needed (nerve pain).    Marland Kitchen glucose blood test strip Dispense based on patient and insurance preference. Use to monitor FSBS 3x daily. Dx: E11.41. 300 each 3  . insulin aspart (NOVOLOG) 100 UNIT/ML injection Inject 0-20 Units into the skin 3 (three) times daily with meals. 10 mL 11  . insulin aspart (NOVOLOG) 100 UNIT/ML injection Inject 0-5 Units into the skin at bedtime. 10 mL 11  . insulin aspart (NOVOLOG) 100 UNIT/ML injection Inject 15 Units into the skin 3 (three) times daily with meals. 10 mL 11  . insulin glargine (LANTUS) 100 UNIT/ML injection Inject 0.65 mLs (65 Units total) into the skin daily at 10 pm. 10 mL 11  . Lancets MISC Dispense based on patient and insurance preference. Use to monitor FSBS 3x daily. Dx: E11.41. 300 each 3  . metFORMIN (GLUCOPHAGE) 1000 MG tablet TAKE 1 TABLET BY MOUTH 2 TIMES DAILY WITH A MEAL. 180 tablet 2  . omeprazole (PRILOSEC) 20 MG capsule TAKE 1 CAPSULE BY MOUTH 2 TIMES DAILY. (Patient taking differently: Take 20 mg by mouth 2 (two) times daily before a meal. ) 180 capsule 2  . oxybutynin (DITROPAN-XL) 5 MG 24 hr tablet Take 1 tablet (5 mg total) by mouth at bedtime. 30 tablet 1  . polyethylene glycol (MIRALAX / GLYCOLAX) packet Take 17 g by mouth daily as needed for mild constipation. (Patient taking differently: Take 17 g by mouth daily as needed for mild constipation. Mix in 8 oz liquid and drink) 14 each 0   No current facility-administered medications for this visit.     Allergies as of 03/14/2017 - Review Complete 03/14/2017    Allergen Reaction Noted  . Sulfonamide derivatives Hives 02/22/2007    Vitals: BP 123/71   Pulse 71   Ht _0  (1.575 m)   Wt 201 lb (91.2 kg)   BMI 36.76 kg/m  Last Weight:  Wt Readings from Last 1 Encounters:  03/14/17 201 lb (91.2 kg)   JGG:EZMO mass index is 36.76 kg/m.     Last Height:   Ht Readings from Last 1 Encounters:  03/14/17 _1  (1.575 m)    Physical exam:  General: The patient is awake, alert and appears not in acute distress. The patient is well groomed. Head: Normocephalic, atraumatic. Neck is supple. Mallampati 5, edentulous. ,  neck circumference: 15.5 . Nasal airflow patent,  Retrognathia is seen.  Cardiovascular:  Regular rate and rhythm , without  murmurs or carotid bruit, and without distended neck veins. Respiratory: Lungs are clear to auscultation. Skin:  Without evidence of edema, or rash Trunk: BMI is 37- The patient's posture is stooped.   Neurologic exam : The patient is awake and alert, oriented to place and time.   Attention span & concentration ability appears normal.  Speech is fluent,  without dysarthria, dysphonia or aphasia.  Mood and affect are appropriate.  Cranial nerves: Pupils are equal and briskly reactive to light. Funduscopic exam deferred. . Extraocular movements  in vertical and horizontal planes intact and without nystagmus. Visual fields by finger perimetry are intact. Hearing to finger rub intact.   Facial sensation intact to fine touch.  Facial motor strength is symmetric and tongue and uvula move midline. Shoulder shrug was symmetrical.   Motor exam:   Normal tone, muscle bulk and symmetric strength in all extremities. Sensory:  Fine touch,  pinprick and vibration were tested in all extremities. Proprioception tested in the upper extremities was normal. Coordination: Rapid alternating movements in the fingers/hands was normal. Finger-to-nose maneuver  normal without evidence of ataxia, dysmetria or tremor. Gait and  station: Patient walks without assistive device and is able unassisted to climb up to the exam table. Strength within normal limits.  Stance is stable and normal.   Deep tendon reflexes: in the  upper and lower extremities are symmetric and intact. Babinski maneuver response is downgoing.    Assessment:  After physical and neurologic examination, review of laboratory studies,  Personal review of imaging studies, reports of other /same  Imaging studies, results of polysomnography and / or neurophysiology testing and pre-existing records as far as provided in visit., my assessment is   1) Mrs. napkins has a family history of coronary artery disease and developed a bundle branch and the circulatory block. This caused her to have irregular heartbeats. Dr. Buelah Manis would like for her to be evaluated for the presence of sleep apnea, and she has indeed multiple risk factors including body mass index, neck circumference, high-grade Mallampati, edentulous oral cavity and age. I would like for Mrs. Matkin's to be evaluated and attended sleep study by split-night protocol. The patient had recently MRIs and EEGs which were negative, it was felt that her syncope is likely related to her cardiac dysfunction and that now after the placement of a cardiac pacemaker she should be evaluated for possible obstructive sleep apnea.   The patient was advised of the nature of the diagnosed disorder , the treatment options and the  risks for general health and wellness arising from not treating the condition.   I spent more than 45 minutes of face to face time with the patient.  Greater than 50% of time was spent in counseling and coordination of care. We have discussed the diagnosis and differential and I answered the patient's questions.    Plan:  Treatment plan and additional workup :  Split night Protocol -  Have patient come early - first patient.   Larey Seat, MD 94/01/164, 5:37 PM  Certified in Neurology by  ABPN Certified in [place] by [facility] [address], [place], [place] [address]

## 2017-03-24 ENCOUNTER — Ambulatory Visit (HOSPITAL_BASED_OUTPATIENT_CLINIC_OR_DEPARTMENT_OTHER): Payer: Medicare Other | Admitting: Hematology and Oncology

## 2017-03-24 ENCOUNTER — Telehealth: Payer: Self-pay | Admitting: Hematology and Oncology

## 2017-03-24 DIAGNOSIS — C50412 Malignant neoplasm of upper-outer quadrant of left female breast: Secondary | ICD-10-CM | POA: Diagnosis not present

## 2017-03-24 DIAGNOSIS — I2510 Atherosclerotic heart disease of native coronary artery without angina pectoris: Secondary | ICD-10-CM | POA: Diagnosis not present

## 2017-03-24 DIAGNOSIS — Z170 Estrogen receptor positive status [ER+]: Secondary | ICD-10-CM | POA: Diagnosis not present

## 2017-03-24 MED ORDER — EXEMESTANE 25 MG PO TABS: 25.0000 mg | ORAL_TABLET | Freq: Every day | ORAL | 3 refills | Status: DC

## 2017-03-24 NOTE — Telephone Encounter (Signed)
Gave patient avs and calendar with appt per 10/12 los

## 2017-03-24 NOTE — Progress Notes (Signed)
Patient Care Team: [facility], [place] Nunnery, MD as PCP - General (Family Medicine)  DIAGNOSIS:  Encounter Diagnosis  Name Primary?  . Malignant neoplasm of upper-outer quadrant of left breast in female, estrogen receptor positive ([place])     SUMMARY OF ONCOLOGIC HISTORY:   Malignant neoplasm of upper-outer quadrant of left female breast ([place])   11/30/2012 Initial Diagnosis    Malignant neoplasm of upper-outer quadrant of left female breast: Left breast bloody nipple discharge that led to mammogram. Initial biopsy revealed DCIS ER/PR positive      12/24/2012 Surgery    Left mastectomy with sentinel lymph node biopsy. Invasive ductal carcinoma grade 2 with 2 foci each measuring less than 0.1 cm with high-grade DCIS 2.6 cm and 1.4 cm 6 sentinel nodes negative ER 100% PR 100% HER-2 negative Ki-67 14%      01/31/2013 -  Anti-estrogen oral therapy    Aromasin 25 mg once daily       CHIEF COMPLIANT: Follow-up on Aromasin  INTERVAL HISTORY: Kristen [name] is a 71 year old with above-mentioned history of left breast cancer treated with mastectomy and is currently on Aromasin. She is tolerating Aromasin extremely well. She reports no major problems or concerns. She has had problems with syncope for which she has had extensive testing including a brain MRI which was normal. She denies any lumps or nodules in the breasts. For her syncope she had a pacemaker put in and since then she has done extremely well  REVIEW OF SYSTEMS:   Constitutional: Denies fevers, chills or abnormal weight loss Eyes: Denies blurriness of vision Ears, nose, mouth, throat, and face: Denies mucositis or sore throat Respiratory: Denies cough, dyspnea or wheezes Cardiovascular: Denies palpitation, chest discomfort Gastrointestinal:  Denies nausea, heartburn or change in bowel habits Skin: Denies abnormal skin rashes Lymphatics: Denies new lymphadenopathy or easy bruising Neurological: Syncopal episodes resolve after pacemaker  was placed Behavioral/Psych: Mood is stable, no new changes  Extremities: No lower extremity edema Breast:  denies any pain or lumps or nodules in left chest wall or right breast All other systems were reviewed with the patient and are negative.  I have reviewed the past medical history, past surgical history, social history and family history with the patient and they are unchanged from previous note.  ALLERGIES:  is allergic to sulfonamide derivatives.  MEDICATIONS:  Current Outpatient Prescriptions  Medication Sig Dispense Refill  . amLODipine (NORVASC) 5 MG tablet Take 1 tablet (5 mg total) by mouth daily.    Marland Kitchen aspirin EC 81 MG tablet Take 81 mg by mouth daily.    Marland Kitchen atorvastatin (LIPITOR) 40 MG tablet TAKE 1 TABLET BY MOUTH DAILY AT 6 PM (Patient taking differently: Take 40 mg by mouth at bedtime. ) 90 tablet 2  . blood glucose meter kit and supplies KIT Dispense based on patient and insurance preference. Use to monitor FSBS 3x daily. Dx: E11.41. 1 each 0  . exemestane (AROMASIN) 25 MG tablet Take 1 tablet (25 mg total) by mouth daily after breakfast. 90 tablet 3  . FLUoxetine (PROZAC) 40 MG capsule Take 1 capsule (40 mg total) by mouth daily. 90 capsule 2  . gabapentin (NEURONTIN) 300 MG capsule Take 1 capsule (300 mg total) by mouth 3 (three) times daily as needed (nerve pain).    Marland Kitchen glucose blood test strip Dispense based on patient and insurance preference. Use to monitor FSBS 3x daily. Dx: E11.41. 300 each 3  . insulin aspart (NOVOLOG) 100 UNIT/ML injection Inject 0-20 Units  into the skin 3 (three) times daily with meals. 10 mL 11  . insulin aspart (NOVOLOG) 100 UNIT/ML injection Inject 0-5 Units into the skin at bedtime. 10 mL 11  . insulin aspart (NOVOLOG) 100 UNIT/ML injection Inject 15 Units into the skin 3 (three) times daily with meals. 10 mL 11  . insulin glargine (LANTUS) 100 UNIT/ML injection Inject 0.65 mLs (65 Units total) into the skin daily at 10 pm. 10 mL 11  . Lancets  MISC Dispense based on patient and insurance preference. Use to monitor FSBS 3x daily. Dx: E11.41. 300 each 3  . metFORMIN (GLUCOPHAGE) 1000 MG tablet TAKE 1 TABLET BY MOUTH 2 TIMES DAILY WITH A MEAL. 180 tablet 2  . omeprazole (PRILOSEC) 20 MG capsule TAKE 1 CAPSULE BY MOUTH 2 TIMES DAILY. (Patient taking differently: Take 20 mg by mouth 2 (two) times daily before a meal. ) 180 capsule 2  . oxybutynin (DITROPAN-XL) 5 MG 24 hr tablet Take 1 tablet (5 mg total) by mouth at bedtime. 30 tablet 1  . polyethylene glycol (MIRALAX / GLYCOLAX) packet Take 17 g by mouth daily as needed for mild constipation. (Patient taking differently: Take 17 g by mouth daily as needed for mild constipation. Mix in 8 oz liquid and drink) 14 each 0   No current facility-administered medications for this visit.     PHYSICAL EXAMINATION: ECOG PERFORMANCE STATUS: 1 - Symptomatic but completely ambulatory  There were no vitals filed for this visit. There were no vitals filed for this visit.  GENERAL:alert, no distress and comfortable SKIN: skin color, texture, turgor are normal, no rashes or significant lesions EYES: normal, Conjunctiva are pink and non-injected, sclera clear OROPHARYNX:no exudate, no erythema and lips, buccal mucosa, and tongue normal  NECK: supple, thyroid normal size, non-tender, without nodularity LYMPH:  no palpable lymphadenopathy in the cervical, axillary or inguinal LUNGS: clear to auscultation and percussion with normal breathing effort HEART: regular rate & rhythm and no murmurs and no lower extremity edema ABDOMEN:abdomen soft, non-tender and normal bowel sounds MUSCULOSKELETAL:no cyanosis of digits and no clubbing  NEURO: alert & oriented x 3 with fluent speech, no focal motor/sensory deficits EXTREMITIES: No lower extremity edema BREAST: No palpable masses or nodules in left chest wall or axilla, no palpable lumps in the right breast or axilla. (exam performed in the presence of a  chaperone)  LABORATORY DATA:  I have reviewed the data as listed   Chemistry      Component Value Date/Time   NA 137 01/03/2017 1421   NA 137 02/26/2015 1059   K 4.9 01/03/2017 1421   K 5.3 (H) 02/26/2015 1059   CL 98 01/03/2017 1421   CO2 26 01/03/2017 1421   CO2 25 02/26/2015 1059   BUN 24 01/03/2017 1421   BUN 25.8 02/26/2015 1059   CREATININE 0.89 01/03/2017 1421   CREATININE 1.4 (H) 02/26/2015 1059      Component Value Date/Time   CALCIUM 9.7 01/03/2017 1421   CALCIUM 9.6 02/26/2015 1059   ALKPHOS 69 12/19/2016 0418   ALKPHOS 92 02/26/2015 1059   AST 23 12/19/2016 0418   AST 12 02/26/2015 1059   ALT 36 12/19/2016 0418   ALT 19 02/26/2015 1059   BILITOT 0.5 12/19/2016 0418   BILITOT 0.47 02/26/2015 1059       Lab Results  Component Value Date   WBC 8.0 12/21/2016   HGB 12.5 12/21/2016   HCT 40.8 12/21/2016   MCV 88.9 12/21/2016   PLT 243  12/21/2016   NEUTROABS 5.7 12/12/2016    ASSESSMENT & PLAN:  Malignant neoplasm of upper-outer quadrant of left female breast Left breast invasive ductal carcinoma with DCIS T1, N0, M0 stage IA ER/PR positive HER-2 negative status post right mastectomy currently on adjuvant hormonal therapy with Aromasin since 01/11/2013.  Aromasin toxicities: She is tolerating it very well without any major problems or concerns. Denies any hot flashes, achiness, denies any vaginal bleeding or muscle aches or pains.  Breast cancerSurveillance:  1. Breast exam 03/24/2017 is normal 2. Mammogram and bone density were reviewed with the patient and they were normal done on 03/03/2014. Mammograms will need to be done. I sent an order for mammograms. 3. Bone density showed a T score of -0.9. Done on 07/02/2014. This will be repeated in 2018. Patient will call her primary care physician to get another bone density test  Prior hospitalization for syncope: Resolved since she had a pacemaker Brain MRI 72,018: No acute intracranial process, few  remote left cerebellar infarcts  I sent a new prescription for Aromasin today.  Return to clinic in 1 year for follow-up breast exams.   I spent 25 minutes talking to the patient of which more than half was spent in counseling and coordination of care.  No orders of the defined types were placed in this encounter.  The patient has a good understanding of the overall plan. she agrees with it. she will call with any problems that may develop before the next visit here.   Rulon Eisenmenger, MD 03/24/17

## 2017-03-24 NOTE — Assessment & Plan Note (Signed)
Left breast invasive ductal carcinoma with DCIS T1, N0, M0 stage IA ER/PR positive HER-2 negative status post right mastectomy currently on adjuvant hormonal therapy with Aromasin since 01/11/2013.  Aromasin toxicities: She is tolerating it very well without any major problems or concerns. Denies any hot flashes, achiness, denies any vaginal bleeding or muscle aches or pains.  Breast cancerSurveillance:  1. Breast exam 03/24/2017 is normal 2. Mammogram and bone density were reviewed with the patient and they were normal done on 03/03/2014. Mammograms will need to be done. I sent an order for mammograms. 3. Bone density showed a T score of -0.9. Done on 07/02/2014. This will be repeated in 2018.  Prior hospitalization for syncope: Felt to be related to blood pressure medications and UTI Brain MRI 72,018: No acute intracranial process, few remote left cerebellar infarcts  I sent a new prescription for Aromasin today.  Return to clinic in 1 year for follow-up breast exams.

## 2017-03-28 ENCOUNTER — Ambulatory Visit (INDEPENDENT_AMBULATORY_CARE_PROVIDER_SITE_OTHER): Payer: Medicare Other | Admitting: Internal Medicine

## 2017-03-28 ENCOUNTER — Encounter: Payer: Self-pay | Admitting: Internal Medicine

## 2017-03-28 DIAGNOSIS — I2510 Atherosclerotic heart disease of native coronary artery without angina pectoris: Secondary | ICD-10-CM

## 2017-03-28 DIAGNOSIS — Z950 Presence of cardiac pacemaker: Secondary | ICD-10-CM

## 2017-03-28 DIAGNOSIS — R55 Syncope and collapse: Secondary | ICD-10-CM | POA: Diagnosis not present

## 2017-03-28 NOTE — Progress Notes (Signed)
Patient Care Team: [facility], Modena Nunnery, MD as PCP - General (Family Medicine)   HPI  Kristen [name] is a 71 y.o. female een in follow-up for syncope in the context of bifascicular block.  She underwent pacing Biotronik CLS 7/18 and as had no interval episodes Prior to device implantation she was having them every 2 weeks.  Echo 7/18 EF  55%  Records and Results Reviewed   Past Medical History:  Diagnosis Date  . Allergy   . Anxiety   . Arthritis   . Bifascicular block   . Blood transfusion without reported diagnosis   . Breast cancer ([place])    breast-left   . Coronary artery disease, non-occlusive    a. Left heart cath 01/2009 showed 40% LAD, 40% D2, 50% distal LAD, 20-30% AV cx, 20-30% luminal Cx, 30% prox RCA, 40-50% mRCA, LVEF normal. b. Intermediate risk nuc 2014 - ? breast attn versus small area of reversible ischemia, EF 74%.  . Depression   . Diabetes mellitus   . GERD (gastroesophageal reflux disease)   . Hemorrhoids   . Hyperlipidemia   . Hypertension   . Nephrolithiasis   . Obesity   . Overactive bladder   . Stroke [facility])    a. MRI 12/2016 - remote left cerebellar infarcts incidentally noted.  . Vitamin D deficiency     Past Surgical History:  Procedure Laterality Date  . APPENDECTOMY    . BACK SURGERY    . BREAST SURGERY    . CARDIAC CATHETERIZATION  01/2009   cath by Dr Lia Foyer revealed nonobstructive CAD  . CESAREAN SECTION    . CHOLECYSTECTOMY    . CHOLECYSTECTOMY, LAPAROSCOPIC    . COLONOSCOPY     last 2012- with polyps  . JOINT REPLACEMENT    . LITHOTRIPSY    . MASTECTOMY W/ SENTINEL NODE BIOPSY Left 12/24/2012   Procedure: LEFT MASTECTOMY WITH LEFT SENTINEL LYMPH NODE BIOPSY;  Surgeon: Rolm Bookbinder, MD;  Location: [facility];  Service: General;  Laterality: Left;  . OOPHORECTOMY Right    1.5  ovaries rem  . PACEMAKER IMPLANT N/A 12/19/2016   Procedure: Pacemaker Implant;  Surgeon: Deboraha Sprang, MD;  Location: [facility];  Service:  Cardiovascular;  Laterality: N/A;  . PARTIAL HIP ARTHROPLASTY     left hip  . POLYPECTOMY    . REPLACEMENT TOTAL KNEE     both knees  . TONSILLECTOMY    . TOTAL MASTECTOMY Left 12/24/2012   Dr Donne Hazel    Current Outpatient Prescriptions  Medication Sig Dispense Refill  . amLODipine (NORVASC) 5 MG tablet Take 1 tablet (5 mg total) by mouth daily.    Marland Kitchen aspirin EC 81 MG tablet Take 81 mg by mouth daily.    Marland Kitchen atorvastatin (LIPITOR) 40 MG tablet TAKE 1 TABLET BY MOUTH DAILY AT 6 PM (Patient taking differently: Take 40 mg by mouth at bedtime. ) 90 tablet 2  . blood glucose meter kit and supplies KIT Dispense based on patient and insurance preference. Use to monitor FSBS 3x daily. Dx: E11.41. 1 each 0  . exemestane (AROMASIN) 25 MG tablet Take 1 tablet (25 mg total) by mouth daily after breakfast. 90 tablet 3  . FLUoxetine (PROZAC) 40 MG capsule Take 1 capsule (40 mg total) by mouth daily. 90 capsule 2  . gabapentin (NEURONTIN) 300 MG capsule Take 1 capsule (300 mg total) by mouth 3 (three) times daily as needed (nerve pain).    Marland Kitchen  glucose blood test strip Dispense based on patient and insurance preference. Use to monitor FSBS 3x daily. Dx: E11.41. 300 each 3  . insulin aspart (NOVOLOG) 100 UNIT/ML injection Inject 0-20 Units into the skin 3 (three) times daily with meals. 10 mL 11  . insulin aspart (NOVOLOG) 100 UNIT/ML injection Inject 0-5 Units into the skin at bedtime. 10 mL 11  . insulin aspart (NOVOLOG) 100 UNIT/ML injection Inject 15 Units into the skin 3 (three) times daily with meals. 10 mL 11  . insulin glargine (LANTUS) 100 UNIT/ML injection Inject 0.65 mLs (65 Units total) into the skin daily at 10 pm. 10 mL 11  . Lancets MISC Dispense based on patient and insurance preference. Use to monitor FSBS 3x daily. Dx: E11.41. 300 each 3  . metFORMIN (GLUCOPHAGE) 1000 MG tablet TAKE 1 TABLET BY MOUTH 2 TIMES DAILY WITH A MEAL. 180 tablet 2  . omeprazole (PRILOSEC) 20 MG capsule TAKE 1  CAPSULE BY MOUTH 2 TIMES DAILY. (Patient taking differently: Take 20 mg by mouth 2 (two) times daily before a meal. ) 180 capsule 2  . oxybutynin (DITROPAN-XL) 5 MG 24 hr tablet Take 1 tablet (5 mg total) by mouth at bedtime. 30 tablet 1  . polyethylene glycol (MIRALAX / GLYCOLAX) packet Take 17 g by mouth daily as needed for mild constipation. (Patient taking differently: Take 17 g by mouth daily as needed for mild constipation. Mix in 8 oz liquid and drink) 14 each 0   No current facility-administered medications for this visit.     Allergies  Allergen Reactions  . Sulfonamide Derivatives Hives      Review of Systems negative except from HPI and PMH  Physical Exam BP 118/70   Pulse 71   Ht 5' 2"  (1.575 m)   Wt 201 lb (91.2 kg)   SpO2 96%   BMI 36.76 kg/m  Well developed and well nourished in no acute distress HENT normal E scleral and icterus clear Neck Supple JVP flat; carotids brisk and full Clear to ausculation Device pocket well healed; without hematoma or erythema.  There is no tethering Regular rate and rhythm, no murmurs gallops or rub Soft with active bowel sounds No clubbing cyanosis  Edema Alert and oriented, grossly normal motor and sensory function Skin Warm and Dry  ECG demonstrates A pacing  intervals 22/14/41  Right bundle LAFB  Assessment and  Plan Syncope  Bifascicular Block  Pacemaker  Biotronik  The patient's device was interrogated and the information was fully reviewed.  The device was reprogrammed to maximize longevity  Hypertension  Diabetes poorly controlled Hgb A1 c >9   No interval syncope  Encouraged to work on diabetes    Current medicines are reviewed at length with the patient today .  The patient does not  have concerns regarding medicines.

## 2017-03-28 NOTE — Patient Instructions (Signed)
Medication Instructions:  Your physician recommends that you continue on your current medications as directed. Please refer to the Current Medication list given to you today.  -- If you need a refill on your cardiac medications before your next appointment, please call your pharmacy. --  Labwork: None ordered  Testing/Procedures: None ordered  Follow-Up: Your physician wants you to follow-up in: 9 months with Tommye Standard PA.  You will receive a reminder letter in the mail two months in advance. If you don't receive a letter, please call our office to schedule the follow-up appointment.  Remote monitoring is used to monitor your Pacemaker  from home. This monitoring reduces the number of office visits required to check your device to one time per year. It allows [place] to keep an eye on the functioning of your device to ensure it is working properly. You are scheduled for a device check from home on 06/27/2017. You may send your transmission at any time that day. If you have a wireless device, the transmission will be sent automatically. After your physician reviews your transmission, you will receive a postcard with your next transmission date.   Thank you for choosing [facility]!!   Frederik Schmidt, RN [phone]  Any Other Special Instructions Will Be Listed Below (If Applicable).

## 2017-03-29 LAB — CUP PACEART INCLINIC DEVICE CHECK
Brady Statistic RA Percent Paced: 50 %
Brady Statistic RV Percent Paced: 6 %
Date Time Interrogation Session: 20181016191600
Implantable Lead Implant Date: 20180709
Implantable Lead Location: 753859
Lead Channel Impedance Value: 702 Ohm
Lead Channel Pacing Threshold Amplitude: 0.5 V
Lead Channel Pacing Threshold Pulse Width: 0.4 ms
Lead Channel Sensing Intrinsic Amplitude: 16.2 mV
Lead Channel Sensing Intrinsic Amplitude: 16.5 mV
Lead Channel Sensing Intrinsic Amplitude: 6.3 mV
Lead Channel Sensing Intrinsic Amplitude: 6.4 mV
Lead Channel Setting Pacing Pulse Width: 0.4 ms
MDC IDC LEAD IMPLANT DT: 20180709
MDC IDC LEAD LOCATION: 753860
MDC IDC MSMT LEADCHNL RA IMPEDANCE VALUE: 390 Ohm
MDC IDC MSMT LEADCHNL RA PACING THRESHOLD AMPLITUDE: 0.7 V
MDC IDC MSMT LEADCHNL RA PACING THRESHOLD AMPLITUDE: 0.7 V
MDC IDC MSMT LEADCHNL RA PACING THRESHOLD AMPLITUDE: 0.7 V
MDC IDC MSMT LEADCHNL RA PACING THRESHOLD PULSEWIDTH: 0.4 ms
MDC IDC MSMT LEADCHNL RA PACING THRESHOLD PULSEWIDTH: 0.4 ms
MDC IDC MSMT LEADCHNL RV PACING THRESHOLD AMPLITUDE: 0.5 V
MDC IDC MSMT LEADCHNL RV PACING THRESHOLD AMPLITUDE: 0.6 V
MDC IDC MSMT LEADCHNL RV PACING THRESHOLD PULSEWIDTH: 0.4 ms
MDC IDC MSMT LEADCHNL RV PACING THRESHOLD PULSEWIDTH: 0.4 ms
MDC IDC MSMT LEADCHNL RV PACING THRESHOLD PULSEWIDTH: 0.4 ms
MDC IDC PG IMPLANT DT: 20180709
MDC IDC SET LEADCHNL RA PACING AMPLITUDE: 2 V
MDC IDC SET LEADCHNL RV PACING AMPLITUDE: 2.4 V
Pulse Gen Model: 407145
Pulse Gen Serial Number: 69106657

## 2017-03-31 ENCOUNTER — Encounter: Payer: Medicare Other | Admitting: Internal Medicine

## 2017-04-09 ENCOUNTER — Ambulatory Visit (INDEPENDENT_AMBULATORY_CARE_PROVIDER_SITE_OTHER): Payer: Medicare Other | Admitting: Neurology

## 2017-04-09 DIAGNOSIS — G4733 Obstructive sleep apnea (adult) (pediatric): Secondary | ICD-10-CM | POA: Diagnosis not present

## 2017-04-09 DIAGNOSIS — I2510 Atherosclerotic heart disease of native coronary artery without angina pectoris: Secondary | ICD-10-CM

## 2017-04-13 NOTE — Procedures (Signed)
No note

## 2017-04-13 NOTE — Progress Notes (Signed)
PATIENT'S NAME:  Kristen [name], Kristen [name] DOB:      [birth year]      MR#:    [id number]     DATE OF RECORDING: 04/09/2017 REFERRING M.D.:  [place] Nunnery. Buelah Manis, M.D. Study Performed:   Baseline Polysomnogram HISTORY: Rule out apnea/ contributing to cardiac arrhythmia   This patient had been hospitalized following a syncope, was diagnosed with bradycardia, had a pacemaker inserted.  By late July 2018 she had been diagnosed with a bundle branch block as well as a fascicular heart block and had some preceding episodes of near fainting spells. During the following rehabilitation stay she was also placed on sliding scale insulin. Her appetite was good she seems to sleep well and rests well. She does not think that she snores, but her family disagrees. Dx: coronary artery disease, fascicular block, heart block, status post cardiac pacemaker placement, diabetes mellitus type 2 uncontrolled. The patient endorsed the Epworth Sleepiness Scale at 4/24 points.   The patient's weight 201 pounds with a height of 62 (inches), resulting in a BMI of 36.9 kg/m2. The patient's neck circumference measured 15.5 inches.  CURRENT MEDICATIONS: see consult    PROCEDURE:  This is a multichannel digital polysomnogram utilizing the Somnostar 11.2 system.  Electrodes and sensors were applied and monitored per AASM Specifications.   EEG, EOG, Chin and Limb EMG, were sampled at 200 Hz.  ECG, Snore and Nasal Pressure, Thermal Airflow, Respiratory Effort, CPAP Flow and Pressure, Oximetry was sampled at 50 Hz. Digital video and audio were recorded.      BASELINE STUDY Lights Out was at 21:10 and Lights On at 05:14.  Total recording time (TRT) was 484.5 minutes, with a total sleep time (TST) of  266 minutes.   The patient's sleep latency was 60 minutes.  REM latency was 409.5 minutes.  The sleep efficiency was 54.9 %.     SLEEP ARCHITECTURE: WASO (Wake after sleep onset) was 158.5 minutes.  There were 17 minutes in Stage N1, 241 minutes Stage  N2, 0 minutes Stage N3 and 8 minutes in Stage REM.  The percentage of Stage N1 was 6.4%, Stage N2 was 90.6%, Stage N3 was 0% and Stage R (REM sleep) was 3.%.   RESPIRATORY ANALYSIS:  There were 0 apneas and 25 hypopneas with a hypopnea index of 5.6 /hour. The patient also had 0 respiratory event related arousals (RERAs).     The total APNEA/HYPOPNEA INDEX (AHI) was 5.6/hour and the total RESPIRATORY DISTURBANCE INDEX was 5.6 /hour.  1 events occurred in REM sleep and 48 events in NREM. The REM AHI was 7.5 /hour, versus a non-REM AHI of 5.6. The patient spent 12 minutes of total sleep time in the supine position and 254 minutes in non-supine. The supine AHI was 15.0 versus a non-supine AHI of 5.2.  OXYGEN SATURATION & C02:  The Wake baseline 02 saturation was 94%, with the lowest being 88%. Time spent below 89% saturation equaled 5 minutes.    PERIODIC LIMB MOVEMENTS:  The patient had a total of 150 Periodic Limb Movements.  The Periodic Limb Movement (PLM) index was 33.8/hr.  and the PLM Arousal index was 9.0/hour.  The arousals were noted as: 36 were spontaneous, 40 were associated with PLMs, and 25 were associated with respiratory events. Audio and video analysis did not show any abnormal or unusual movements, behaviors, phonations or vocalizations.  The patient took two bathroom breaks. Snoring was noted in supine.  Post-study, the patient indicated that sleep was the same as  usual.   IMPRESSION: Mildest form of Obstructive Sleep Apnea (OSA), only manifesting as Hypopnea.  1.  Moderate- severe Periodic Limb Movement Disorder (PLMD) only manifesting in NREM sleep. 2. Snoring  RECOMMENDATIONS: Many periodic limb movements of sleep (PLMS) with associated sleep disruption were recorded.  Consider treating the PLMS primarily.  Avoid caffeine-containing beverages and chocolate. 1. Correlate clinically for a history consistent with regarding restless legs syndrome (RLS).    Consider secondary restless  legs syndrome.  Pharmacotherapy may be warranted.  Obtain a serum ferritin level if the clinical history is consistent with RLS.  Consider iron therapy and evaluation for iron deficiency anemia if the serum ferritin level < 50 ng/ml.  Certain medications or substances may aggravate RLS and common offenders may include the following:  nicotine, caffeine, SSRIs, TCAs, phenothiazine, dopamine antagonists, diphenhydramine, and alcohol.  2. Positional therapy is advised to decrease/ alleviate snoring.  3. A follow up appointment will be scheduled in the Sleep Clinic at [facility]. The referring provider will be notified of the results.     I certify that I have reviewed the entire raw data recording prior to the issuance of this report in accordance with the Standards of Accreditation of the American Academy of Sleep Medicine (AASM)    Larey Seat, MD   04-13-2017 Diplomat, American Board of Psychiatry and Neurology  Diplomat, American Board of [place] Director, [facility] [employer]

## 2017-04-14 ENCOUNTER — Telehealth: Payer: Self-pay | Admitting: Neurology

## 2017-04-14 NOTE — Telephone Encounter (Signed)
Pt verbalized understanding on following up with PCP. Pt had no questions at this time but was encouraged to call back if questions arise.

## 2017-04-14 NOTE — Telephone Encounter (Signed)
----- Message from Larey Seat, MD sent at 04/13/2017 10:46 AM EDT ----- PATIENT'S NAME:  Kristen [name], Kristen [name] DOB:      06/17/1945      MR#:    [id number]     DATE OF RECORDING: 04/09/2017 REFERRING M.D.:  [place] Nunnery. Buelah Manis, M.D. Study Performed:   Baseline Polysomnogram HISTORY: Rule out apnea/ contributing to cardiac arrhythmia   This patient had been hospitalized following a syncope, was diagnosed with bradycardia, had a pacemaker inserted.  By late July 2018 she had been diagnosed with a bundle branch block as well as a fascicular heart block and had some preceding episodes of near fainting spells. During the following rehabilitation stay she was also placed on sliding scale insulin. Her appetite was good she seems to sleep well and rests well. She does not think that she snores, but her family disagrees. Dx: coronary artery disease, fascicular block, heart block, status post cardiac pacemaker placement, diabetes mellitus type 2 uncontrolled. The patient endorsed the Epworth Sleepiness Scale at 4/24 points.   The patient's weight 201 pounds with a height of 62 (inches), resulting in a BMI of 36.9 kg/m2. The patient's neck circumference measured 15.5 inches.  CURRENT MEDICATIONS: see consult    PROCEDURE:  This is a multichannel digital polysomnogram utilizing the Somnostar 11.2 system.  Electrodes and sensors were applied and monitored per AASM Specifications.   EEG, EOG, Chin and Limb EMG, were sampled at 200 Hz.  ECG, Snore and Nasal Pressure, Thermal Airflow, Respiratory Effort, CPAP Flow and Pressure, Oximetry was sampled at 50 Hz. Digital video and audio were recorded.      BASELINE STUDY Lights Out was at 21:10 and Lights On at 05:14.  Total recording time (TRT) was 484.5 minutes, with a total sleep time (TST) of  266 minutes.   The patient's sleep latency was 60 minutes.  REM latency was 409.5 minutes.  The sleep efficiency was 54.9 %.     SLEEP ARCHITECTURE: WASO (Wake after sleep  onset) was 158.5 minutes.  There were 17 minutes in Stage N1, 241 minutes Stage N2, 0 minutes Stage N3 and 8 minutes in Stage REM.  The percentage of Stage N1 was 6.4%, Stage N2 was 90.6%, Stage N3 was 0% and Stage R (REM sleep) was 3.%.   RESPIRATORY ANALYSIS:  There were 0 apneas and 25 hypopneas with a hypopnea index of 5.6 /hour. The patient also had 0 respiratory event related arousals (RERAs).     The total APNEA/HYPOPNEA INDEX (AHI) was 5.6/hour and the total RESPIRATORY DISTURBANCE INDEX was 5.6 /hour.  1 events occurred in REM sleep and 48 events in NREM. The REM AHI was 7.5 /hour, versus a non-REM AHI of 5.6. The patient spent 12 minutes of total sleep time in the supine position and 254 minutes in non-supine. The supine AHI was 15.0 versus a non-supine AHI of 5.2.  OXYGEN SATURATION & C02:  The Wake baseline 02 saturation was 94%, with the lowest being 88%. Time spent below 89% saturation equaled 5 minutes.    PERIODIC LIMB MOVEMENTS:  The patient had a total of 150 Periodic Limb Movements.  The Periodic Limb Movement (PLM) index was 33.8/hr.  and the PLM Arousal index was 9.0/hour.  The arousals were noted as: 36 were spontaneous, 40 were associated with PLMs, and 25 were associated with respiratory events. Audio and video analysis did not show any abnormal or unusual movements, behaviors, phonations or vocalizations.  The patient took two bathroom breaks. Snoring was noted  in supine.  Post-study, the patient indicated that sleep was the same as usual.   IMPRESSION: Mildest form of Obstructive Sleep Apnea (OSA), only manifesting as Hypopnea.  1.  Moderate- severe Periodic Limb Movement Disorder (PLMD) only manifesting in NREM sleep. 2. Snoring  RECOMMENDATIONS: Many periodic limb movements of sleep (PLMS) with associated sleep disruption were recorded.  Consider treating the PLMS primarily.  Avoid caffeine-containing beverages and chocolate. 1. Correlate clinically for a history  consistent with regarding restless legs syndrome (RLS).    Consider secondary restless legs syndrome.  Pharmacotherapy may be warranted.  Obtain a serum ferritin level if the clinical history is consistent with RLS.  Consider iron therapy and evaluation for iron deficiency anemia if the serum ferritin level < 50 ng/ml.  Certain medications or substances may aggravate RLS and common offenders may include the following:  nicotine, caffeine, SSRIs, TCAs, phenothiazine, dopamine antagonists, diphenhydramine, and alcohol.  2. Positional therapy is advised to decrease/ alleviate snoring.  3. A follow up appointment will be scheduled in the Sleep Clinic at [facility]. The referring provider will be notified of the results.     I certify that I have reviewed the entire raw data recording prior to the issuance of this report in accordance with the Standards of Accreditation of the American Academy of Sleep Medicine (AASM)    Larey Seat, MD   04-13-2017 Diplomat, American Board of Psychiatry and Neurology  Diplomat, American Board of Sleep Medicine Medical Director, [place] [facility]   I will evaluate Mrs. Scarber for PLMs if desired- she did not have apnea and neither signifcant hypoxemia. CD

## 2017-04-14 NOTE — Telephone Encounter (Signed)
Called and spoke with the patient about her sleep study results. Pt had mildest form of sleep apnea not requiring any treatment. The patient did have mod- severe PLMD. I have instructed the patient to follow up with PCP

## 2017-04-24 ENCOUNTER — Other Ambulatory Visit: Payer: Self-pay | Admitting: Family Medicine

## 2017-04-24 DIAGNOSIS — E0841 Diabetes mellitus due to underlying condition with diabetic mononeuropathy: Secondary | ICD-10-CM

## 2017-05-18 ENCOUNTER — Other Ambulatory Visit: Payer: Self-pay | Admitting: Family Medicine

## 2017-05-29 ENCOUNTER — Other Ambulatory Visit: Payer: Self-pay | Admitting: Family Medicine

## 2017-06-23 ENCOUNTER — Other Ambulatory Visit: Payer: Self-pay | Admitting: Hematology and Oncology

## 2017-06-23 DIAGNOSIS — Z139 Encounter for screening, unspecified: Secondary | ICD-10-CM

## 2017-06-27 ENCOUNTER — Ambulatory Visit (INDEPENDENT_AMBULATORY_CARE_PROVIDER_SITE_OTHER): Payer: Medicare Other | Admitting: *Deleted

## 2017-06-27 DIAGNOSIS — R55 Syncope and collapse: Secondary | ICD-10-CM | POA: Diagnosis not present

## 2017-06-28 NOTE — Progress Notes (Signed)
Remote pacemaker transmission.   

## 2017-06-30 ENCOUNTER — Encounter: Payer: Self-pay | Admitting: Cardiology

## 2017-07-03 LAB — CUP PACEART REMOTE DEVICE CHECK
Date Time Interrogation Session: 20190121153123
Implantable Lead Implant Date: 20180709
Implantable Lead Implant Date: 20180709
Implantable Lead Location: 753859
Implantable Lead Model: 5076
Implantable Lead Model: 5076
Implantable Pulse Generator Implant Date: 20180709
MDC IDC LEAD LOCATION: 753860
MDC IDC PG SERIAL: 69106657
Pulse Gen Model: 407145

## 2017-07-10 ENCOUNTER — Other Ambulatory Visit: Payer: Self-pay | Admitting: *Deleted

## 2017-07-10 MED ORDER — METFORMIN HCL 1000 MG PO TABS: ORAL_TABLET | ORAL | 2 refills | Status: DC

## 2017-07-10 MED ORDER — ATORVASTATIN CALCIUM 40 MG PO TABS: 40.0000 mg | ORAL_TABLET | Freq: Every day | ORAL | 1 refills | Status: DC

## 2017-07-28 ENCOUNTER — Other Ambulatory Visit: Payer: Self-pay | Admitting: Family Medicine

## 2017-08-09 ENCOUNTER — Other Ambulatory Visit: Payer: Self-pay | Admitting: Family Medicine

## 2017-08-11 ENCOUNTER — Telehealth: Payer: Self-pay | Admitting: *Deleted

## 2017-08-11 MED ORDER — TRESIBA FLEXTOUCH 100 UNIT/ML ~~LOC~~ SOPN: 60.0000 [IU] | PEN_INJECTOR | Freq: Every day | SUBCUTANEOUS | 0 refills | Status: DC

## 2017-08-11 MED ORDER — INSULIN ASPART 100 UNIT/ML FLEXPEN: 5.0000 [IU] | PEN_INJECTOR | Freq: Three times a day (TID) | SUBCUTANEOUS | 0 refills | Status: DC

## 2017-08-11 NOTE — Telephone Encounter (Signed)
Received call from pharmacy.   Reports that patient is requesting refill on Tresiba and Novolog. Call placed to patient to verify dosage. Prescription sent to pharmacy.   Advised OV required.

## 2017-08-15 ENCOUNTER — Encounter: Payer: Self-pay | Admitting: Family Medicine

## 2017-08-15 ENCOUNTER — Ambulatory Visit (INDEPENDENT_AMBULATORY_CARE_PROVIDER_SITE_OTHER): Payer: Medicare Other | Admitting: Family Medicine

## 2017-08-15 ENCOUNTER — Other Ambulatory Visit: Payer: Self-pay

## 2017-08-15 VITALS — BP 122/70 | HR 86 | Temp 98.3°F | Resp 14 | Ht 62.0 in | Wt 204.0 lb

## 2017-08-15 DIAGNOSIS — E1365 Other specified diabetes mellitus with hyperglycemia: Secondary | ICD-10-CM | POA: Diagnosis not present

## 2017-08-15 DIAGNOSIS — I10 Essential (primary) hypertension: Secondary | ICD-10-CM

## 2017-08-15 DIAGNOSIS — Z23 Encounter for immunization: Secondary | ICD-10-CM

## 2017-08-15 DIAGNOSIS — I2510 Atherosclerotic heart disease of native coronary artery without angina pectoris: Secondary | ICD-10-CM | POA: Diagnosis not present

## 2017-08-15 DIAGNOSIS — E782 Mixed hyperlipidemia: Secondary | ICD-10-CM

## 2017-08-15 DIAGNOSIS — E0841 Diabetes mellitus due to underlying condition with diabetic mononeuropathy: Secondary | ICD-10-CM

## 2017-08-15 DIAGNOSIS — IMO0002 Reserved for concepts with insufficient information to code with codable children: Secondary | ICD-10-CM

## 2017-08-15 MED ORDER — EZETIMIBE 10 MG PO TABS: 10.0000 mg | ORAL_TABLET | Freq: Every day | ORAL | 2 refills | Status: DC

## 2017-08-15 MED ORDER — OXYBUTYNIN CHLORIDE ER 5 MG PO TB24: 5.0000 mg | ORAL_TABLET | Freq: Every day | ORAL | 2 refills | Status: DC

## 2017-08-15 MED ORDER — AMLODIPINE BESYLATE 5 MG PO TABS: 5.0000 mg | ORAL_TABLET | Freq: Every day | ORAL | 2 refills | Status: DC

## 2017-08-15 MED ORDER — GABAPENTIN 300 MG PO CAPS
300.0000 mg | ORAL_CAPSULE | Freq: Two times a day (BID) | ORAL | 2 refills | Status: DC
Start: 2017-08-15 — End: 2018-03-06

## 2017-08-15 NOTE — Progress Notes (Signed)
   Subjective:    Patient ID: Kristen [name], female    DOB: [birth year], 72 y.o.   MRN: [id number]  Patient presents for Medication Review (was in [place] days after pacemaker placed and has not F/U with [place] since)   Pt here with her niece  DM- last A1C 9.8%, on Tresiba 60 units, Novolog 5 units    Metformin , did not bring meter   out of gabapentin  Hyperlipidemia- on lipitor   HTN-  Out of norvasc   Breast cancer history- in Oct will be on Aromasin for 5 years   Has had pacemaker placed back in the fall, currently foloowed by pacer clinic  - had sleep evaluation no CPAP was needed     OAB- out of oxybutin    Review Of Systems:  GEN- denies fatigue, fever, weight loss,weakness, recent illness HEENT- denies eye drainage, change in vision, nasal discharge, CVS- denies chest pain, palpitations RESP- denies SOB, cough, wheeze ABD- denies N/V, change in stools, abd pain GU- denies dysuria, hematuria, dribbling, incontinence MSK- denies joint pain, muscle aches, injury Neuro- denies headache, dizziness, syncope, seizure activity       Objective:    BP 122/70   Pulse 86   Temp 98.3 F (36.8 C) (Oral)   Resp 14   Ht 5\' 2"  (1.575 m)   Wt 204 lb (92.5 kg)   SpO2 97%   BMI 37.31 kg/m  GEN- NAD, alert and oriented x3 HEENT- PERRL, EOMI, non injected sclera, pink conjunctiva, MMM, oropharynx clear Neck- Supple, no thyromegaly CVS- RRR, no murmur RESP-CTAB ABD-NABS,soft,NT,ND EXT- No edema Pulses- Radial, DP- 2+        Assessment & Plan:      Problem List Items Addressed This Visit      Unprioritized   Diabetic neuropathy (HCC)   Relevant Medications   gabapentin (NEURONTIN) 300 MG capsule   Hyperlipidemia   Relevant Medications   amLODipine (NORVASC) 5 MG tablet   ezetimibe (ZETIA) 10 MG tablet   Other Relevant Orders   Lipid panel (Completed)   Essential hypertension - Primary    Blood pressure controlled      Relevant Medications   amLODipine (NORVASC) 5 MG tablet   ezetimibe (ZETIA) 10 MG tablet   Other Relevant Orders   CBC with Differential/Platelet (Completed)   Comprehensive metabolic panel (Completed)   DM (diabetes mellitus), secondary uncontrolled ([place])    Diabetes has been chronically uncontrolled.  Her goal is A1c around 8% Increase her NovoLog to 8 units with each meal continue the Lantus at 60 units A1c resulted at 9.2%      Relevant Orders   Hemoglobin A1c (Completed)   CAD (coronary artery disease)    Followed by cardiology she is on statin drug.  Blood pressure is controlled now has pacemaker      Relevant Medications   amLODipine (NORVASC) 5 MG tablet   ezetimibe (ZETIA) 10 MG tablet    Other Visit Diagnoses    Flu vaccine need       Relevant Orders   Flu vaccine HIGH DOSE PF (Fluzone High dose) (Completed)      Note: This dictation was prepared with Dragon dictation along with smaller phrase technology. Any transcriptional errors that result from this process are unintentional.

## 2017-08-15 NOTE — Patient Instructions (Addendum)
Mammogram of right breast to be done in [place]  We will call with lab results Medications  F/U 3 months

## 2017-08-16 ENCOUNTER — Encounter: Payer: Self-pay | Admitting: Family Medicine

## 2017-08-16 LAB — LIPID PANEL
CHOL/HDL RATIO: 3.3 (calc) (ref <5.0)
CHOLESTEROL: 111 mg/dL (ref <200)
HDL: 34 mg/dL — AB (ref >50)
LDL CHOLESTEROL (CALC): 56 mg/dL
NON-HDL CHOLESTEROL (CALC): 77 mg/dL (ref <130)
Triglycerides: 131 mg/dL (ref <150)

## 2017-08-16 LAB — COMPREHENSIVE METABOLIC PANEL
AG Ratio: 1.7 (calc) (ref 1.0–2.5)
ALBUMIN MSPROF: 4.4 g/dL (ref 3.6–5.1)
ALT: 25 U/L (ref 6–29)
AST: 16 U/L (ref 10–35)
Alkaline phosphatase (APISO): 67 U/L (ref 33–130)
BUN/Creatinine Ratio: 22 (calc) (ref 6–22)
BUN: 23 mg/dL (ref 7–25)
CHLORIDE: 106 mmol/L (ref 98–110)
CO2: 30 mmol/L (ref 20–32)
CREATININE: 1.03 mg/dL — AB (ref 0.60–0.93)
Calcium: 9.7 mg/dL (ref 8.6–10.4)
GLOBULIN: 2.6 g/dL (ref 1.9–3.7)
Glucose, Bld: 145 mg/dL — ABNORMAL HIGH (ref 65–99)
POTASSIUM: 5.1 mmol/L (ref 3.5–5.3)
SODIUM: 144 mmol/L (ref 135–146)
TOTAL PROTEIN: 7 g/dL (ref 6.1–8.1)
Total Bilirubin: 0.4 mg/dL (ref 0.2–1.2)

## 2017-08-16 LAB — CBC WITH DIFFERENTIAL/PLATELET
BASOS PCT: 0.5 %
Basophils Absolute: 39 cells/uL (ref 0–200)
EOS PCT: 4.1 %
Eosinophils Absolute: 320 cells/uL (ref 15–500)
HEMATOCRIT: 37.5 % (ref 35.0–45.0)
Hemoglobin: 12.3 g/dL (ref 11.7–15.5)
Lymphs Abs: 2824 cells/uL (ref 850–3900)
MCH: 27.6 pg (ref 27.0–33.0)
MCHC: 32.8 g/dL (ref 32.0–36.0)
MCV: 84.1 fL (ref 80.0–100.0)
MPV: 10.7 fL (ref 7.5–12.5)
Monocytes Relative: 10.7 %
NEUTROS ABS: 3783 {cells}/uL (ref 1500–7800)
NEUTROS PCT: 48.5 %
PLATELETS: 278 10*3/uL (ref 140–400)
RBC: 4.46 10*6/uL (ref 3.80–5.10)
RDW: 14.1 % (ref 11.0–15.0)
Total Lymphocyte: 36.2 %
WBC mixed population: 835 cells/uL (ref 200–950)
WBC: 7.8 10*3/uL (ref 3.8–10.8)

## 2017-08-16 LAB — HEMOGLOBIN A1C
Hgb A1c MFr Bld: 9.2 % of total Hgb — ABNORMAL HIGH (ref <5.7)
Mean Plasma Glucose: 217 (calc)
eAG (mmol/L): 12 (calc)

## 2017-08-16 MED ORDER — INSULIN ASPART 100 UNIT/ML FLEXPEN: 8.0000 [IU] | PEN_INJECTOR | Freq: Three times a day (TID) | SUBCUTANEOUS | 2 refills | Status: DC

## 2017-08-16 NOTE — Assessment & Plan Note (Signed)
Followed by cardiology she is on statin drug.  Blood pressure is controlled now has pacemaker

## 2017-08-16 NOTE — Assessment & Plan Note (Signed)
Diabetes has been chronically uncontrolled.  Her goal is A1c around 8% Increase her NovoLog to 8 units with each meal continue the Lantus at 60 units A1c resulted at 9.2%

## 2017-08-16 NOTE — Assessment & Plan Note (Signed)
Blood pressure controlled. 

## 2017-08-18 ENCOUNTER — Other Ambulatory Visit: Payer: Self-pay | Admitting: *Deleted

## 2017-09-11 ENCOUNTER — Other Ambulatory Visit: Payer: Self-pay | Admitting: Family Medicine

## 2017-09-18 ENCOUNTER — Other Ambulatory Visit: Payer: Self-pay | Admitting: *Deleted

## 2017-09-18 MED ORDER — TRESIBA FLEXTOUCH 100 UNIT/ML ~~LOC~~ SOPN: PEN_INJECTOR | SUBCUTANEOUS | 0 refills | Status: DC

## 2017-09-18 MED ORDER — INSULIN PEN NEEDLE 32G X 4 MM MISC: 1 refills | Status: DC

## 2017-09-26 ENCOUNTER — Ambulatory Visit (INDEPENDENT_AMBULATORY_CARE_PROVIDER_SITE_OTHER): Payer: Medicare Other | Admitting: *Deleted

## 2017-09-26 DIAGNOSIS — R55 Syncope and collapse: Secondary | ICD-10-CM

## 2017-09-26 NOTE — Progress Notes (Signed)
Remote pacemaker transmission.   

## 2017-09-28 ENCOUNTER — Encounter: Payer: Self-pay | Admitting: Cardiology

## 2017-10-10 LAB — CUP PACEART REMOTE DEVICE CHECK
Battery Remaining Percentage: 90 %
Brady Statistic AS VS Percent: 18 %
Date Time Interrogation Session: 20190430042729
Implantable Lead Implant Date: 20180709
Implantable Lead Implant Date: 20180709
Implantable Lead Location: 753859
Implantable Lead Location: 753860
Implantable Pulse Generator Implant Date: 20180709
Lead Channel Impedance Value: 675 Ohm
Lead Channel Pacing Threshold Amplitude: 0.5 V
Lead Channel Setting Pacing Amplitude: 2 V
Lead Channel Setting Pacing Amplitude: 2.4 V
Lead Channel Setting Pacing Pulse Width: 0.4 ms
MDC IDC MSMT LEADCHNL RA IMPEDANCE VALUE: 390 Ohm
MDC IDC MSMT LEADCHNL RA PACING THRESHOLD AMPLITUDE: 0.8 V
MDC IDC MSMT LEADCHNL RA PACING THRESHOLD PULSEWIDTH: 0.4 ms
MDC IDC MSMT LEADCHNL RV PACING THRESHOLD PULSEWIDTH: 0.4 ms
MDC IDC STAT BRADY AP VP PERCENT: 11 %
MDC IDC STAT BRADY AP VS PERCENT: 66 %
MDC IDC STAT BRADY AS VP PERCENT: 4 %
MDC IDC STAT BRADY RA PERCENT PACED: 76 %
MDC IDC STAT BRADY RV PERCENT PACED: 16 %
Pulse Gen Model: 407145
Pulse Gen Serial Number: 69106657

## 2017-10-19 ENCOUNTER — Other Ambulatory Visit: Payer: Self-pay | Admitting: Family Medicine

## 2017-11-16 ENCOUNTER — Other Ambulatory Visit: Payer: Self-pay | Admitting: Family Medicine

## 2017-12-12 ENCOUNTER — Other Ambulatory Visit: Payer: Self-pay | Admitting: Family Medicine

## 2017-12-26 ENCOUNTER — Ambulatory Visit (INDEPENDENT_AMBULATORY_CARE_PROVIDER_SITE_OTHER): Payer: Medicare Other | Admitting: *Deleted

## 2017-12-26 DIAGNOSIS — R55 Syncope and collapse: Secondary | ICD-10-CM

## 2017-12-26 NOTE — Progress Notes (Signed)
Remote pacemaker transmission.   

## 2017-12-27 ENCOUNTER — Encounter: Payer: Self-pay | Admitting: Cardiology

## 2017-12-29 LAB — CUP PACEART REMOTE DEVICE CHECK
Battery Remaining Percentage: 90 %
Brady Statistic AP VP Percent: 11 %
Brady Statistic AP VS Percent: 60 %
Brady Statistic AS VP Percent: 4 %
Brady Statistic AS VS Percent: 25 %
Brady Statistic RA Percent Paced: 69 %
Brady Statistic RV Percent Paced: 15 %
Implantable Lead Implant Date: 20180709
Implantable Lead Implant Date: 20180709
Implantable Lead Location: 753860
Implantable Lead Model: 5076
Implantable Lead Model: 5076
Lead Channel Pacing Threshold Amplitude: 0.5 V
Lead Channel Pacing Threshold Amplitude: 0.8 V
Lead Channel Pacing Threshold Pulse Width: 0.4 ms
Lead Channel Setting Pacing Amplitude: 2.4 V
MDC IDC LEAD LOCATION: 753859
MDC IDC MSMT LEADCHNL RA IMPEDANCE VALUE: 390 Ohm
MDC IDC MSMT LEADCHNL RV IMPEDANCE VALUE: 680 Ohm
MDC IDC MSMT LEADCHNL RV PACING THRESHOLD PULSEWIDTH: 0.4 ms
MDC IDC PG IMPLANT DT: 20180709
MDC IDC PG SERIAL: 69106657
MDC IDC SESS DTM: 20190719035645
MDC IDC SET LEADCHNL RA PACING AMPLITUDE: 2 V
MDC IDC SET LEADCHNL RV PACING PULSEWIDTH: 0.4 ms

## 2018-01-09 ENCOUNTER — Other Ambulatory Visit: Payer: Self-pay | Admitting: Family Medicine

## 2018-02-02 ENCOUNTER — Encounter: Payer: Medicare Other | Admitting: Physician Assistant

## 2018-02-09 ENCOUNTER — Other Ambulatory Visit: Payer: Self-pay | Admitting: Family Medicine

## 2018-02-16 ENCOUNTER — Ambulatory Visit (INDEPENDENT_AMBULATORY_CARE_PROVIDER_SITE_OTHER): Payer: Medicare Other | Admitting: Physician Assistant

## 2018-02-16 VITALS — BP 112/62 | HR 60 | Ht 62.0 in | Wt 198.0 lb

## 2018-02-16 DIAGNOSIS — I2510 Atherosclerotic heart disease of native coronary artery without angina pectoris: Secondary | ICD-10-CM

## 2018-02-16 DIAGNOSIS — Z950 Presence of cardiac pacemaker: Secondary | ICD-10-CM

## 2018-02-16 DIAGNOSIS — I10 Essential (primary) hypertension: Secondary | ICD-10-CM | POA: Diagnosis not present

## 2018-02-16 NOTE — Patient Instructions (Addendum)
Medication Instructions:   Your physician recommends that you continue on your current medications as directed. Please refer to the Current Medication list given to you today.    If you need a refill on your cardiac medications before your next appointment, please call your pharmacy.  Labwork: NONE ORDERED  TODAY    Testing/Procedures: NONE ORDERED  TODAY    Follow-Up: Your physician wants you to follow-up in: [place] will receive a reminder letter in the mail two months in advance. If you don't receive a letter, please call our office to schedule the follow-up appointment.    Remote monitoring is used to monitor your Pacemaker of ICD from home. This monitoring reduces the number of office visits required to check your device to one time per year. It allows [place] to keep an eye on the functioning of your device to ensure it is working properly. You are scheduled for a device check from home on .10-15-19You may send your transmission at any time that day. If you have a wireless device, the transmission will be sent automatically. After your physician reviews your transmission, you will receive a postcard with your next transmission date.     Any Other Special Instructions Will Be Listed Below (If Applicable).

## 2018-02-16 NOTE — Progress Notes (Signed)
Cardiology Office Note Date:  02/16/2018  Patient ID:  Kristen [name], Kristen [name] 22-Mar-1946, MRN [id number] PCP:  Alycia Rossetti, MD  Electrophysiologist:  Dr. Caryl Comes   Chief Complaint: routine EP/device visit  History of Present Illness: Kristen [name] is a 72 y.o. female with history of HTN, HLD, anxiety/deporession, arthritis, an incidental findings of old strokes by imaging, breast ca treated surgically, on Aromasin, DM, scattered mild-moderate nonobstructive CAD by cath 2010, showing 40% LAD, 40% D2, 50% distal LAD, 20-30% AV cx, 20-30% luminal Cx, 30% prox RCA, 40-50% mRCA, LVEF normal. Nuc 12/2012 was intermediate risk - defect of the apex that appears to be mostly due to breast attenuation but also a very small area of reversible ischemia, EF normal at 74% - (this was done for cardiac clearance for breast surgery and telephone notes indicate she had been cleared based on this study), and last year developed syncope with conduction system disease and had PPM implanted.  She comes in today to be seen for Dr. Caryl Comes.  She last saw him on October at her 57m[facility]ost PPM visit.  At that time she was feeling well, had not had any syncope, no changes were made to her  tx  She is accompanied by her daughter who is an EMT.  The patient has been doing very well.  No near syncope or syncope sine her pacer implant.  Just the other day after being out with errands/grocery shopping bringing in the items, she got a little lightheaded, sat and it resolved,  No CP or palpitations, no SOB.   Device information: Biotronik dual chamber PPM (CLS) implanted 12/19/16   Past Medical History:  Diagnosis Date  . Allergy   . Anemia 02/15/2009   Qualifier: Diagnosis of  By: FOrville GovernCMA, CArbie Cookey   . Anxiety   . Arthritis   . Bifascicular block   . Blood transfusion without reported diagnosis   . Breast cancer ([facility]    breast-left   . CAD (coronary artery disease) 02/15/2009   Qualifier: Diagnosis of  By: FOrville GovernCMA, Carol     . Coronary artery disease, non-occlusive    a. Left heart cath 01/2009 showed 40% LAD, 40% D2, 50% distal LAD, 20-30% AV cx, 20-30% luminal Cx, 30% prox RCA, 40-50% mRCA, LVEF normal. b. Intermediate risk nuc 2014 - ? breast attn versus small area of reversible ischemia, EF 74%.  . Depression   . Diabetes mellitus   . Diabetic neuropathy ([facility] 07/24/2013  . DM (diabetes mellitus), secondary uncontrolled ([facility] 02/11/2015  . Essential hypertension 02/15/2009   Qualifier: Diagnosis of  By: FOrville GovernCMA, CArbie Cookey   . GERD (gastroesophageal reflux disease)   . Hemorrhoids   . Hyperkalemia 02/11/2015  . Hyperlipidemia   . Hypertension   . Malignant neoplasm of upper-outer quadrant of left female breast ([facility] 11/30/2012  . Nephrolithiasis   . Obesity   . Overactive bladder   . Personal history of colonic polyps 02/12/2016   09/2010 - 2 diminutive adenomas 02/12/2016 5 mm descending polyp - prolapse type polyp (not precancerous) no recall needed given hx, findings and age   . RBBB 02/20/2009   Qualifier: Diagnosis of  By: WVerl Blalock MD, FDelanna AhmadiRight bundle blanch block, anterior fascicular block and incomplete posterior fascicular block 12/19/2016  . S/P placement of cardiac pacemaker 01/03/2017  . Stroke ([facility]    a. MRI 12/2016 - remote left cerebellar infarcts incidentally noted.  . Syncope 02/11/2015  . Vitamin  D deficiency     Past Surgical History:  Procedure Laterality Date  . APPENDECTOMY    . BACK SURGERY    . BREAST SURGERY    . CARDIAC CATHETERIZATION  01/2009   cath by Dr Lia Foyer revealed nonobstructive CAD  . CESAREAN SECTION    . CHOLECYSTECTOMY    . CHOLECYSTECTOMY, LAPAROSCOPIC    . COLONOSCOPY     last 2012- with polyps  . JOINT REPLACEMENT    . LITHOTRIPSY    . MASTECTOMY W/ SENTINEL NODE BIOPSY Left 12/24/2012   Procedure: LEFT MASTECTOMY WITH LEFT SENTINEL LYMPH NODE BIOPSY;  Surgeon: Rolm Bookbinder, MD;  Location: [facility];  Service: General;  Laterality: Left;  .  OOPHORECTOMY Right    1.5  ovaries rem  . PACEMAKER IMPLANT N/A 12/19/2016   Procedure: Pacemaker Implant;  Surgeon: Deboraha Sprang, MD;  Location: [facility];  Service: Cardiovascular;  Laterality: N/A;  . PARTIAL HIP ARTHROPLASTY     left hip  . POLYPECTOMY    . REPLACEMENT TOTAL KNEE     both knees  . TONSILLECTOMY    . TOTAL MASTECTOMY Left 12/24/2012   Dr Donne Hazel    Current Outpatient Medications  Medication Sig Dispense Refill  . amLODipine (NORVASC) 5 MG tablet Take 1 tablet (5 mg total) by mouth daily. 90 tablet 2  . aspirin EC 81 MG tablet Take 81 mg by mouth daily.    Marland Kitchen atorvastatin (LIPITOR) 40 MG tablet TAKE ONE TABLET BY MOUTH AT BEDTIME 90 tablet 0  . blood glucose meter kit and supplies KIT Dispense based on patient and insurance preference. Use to monitor FSBS 3x daily. Dx: E11.41. 1 each 0  . exemestane (AROMASIN) 25 MG tablet Take 1 tablet (25 mg total) by mouth daily after breakfast. 90 tablet 3  . ezetimibe (ZETIA) 10 MG tablet Take 1 tablet (10 mg total) by mouth daily. 90 tablet 2  . FLUoxetine (PROZAC) 40 MG capsule Take 1 capsule (40 mg total) by mouth daily. 90 capsule 2  . gabapentin (NEURONTIN) 300 MG capsule Take 1 capsule (300 mg total) by mouth 2 (two) times daily. 180 capsule 2  . glucose blood test strip Dispense based on patient and insurance preference. Use to monitor FSBS 3x daily. Dx: E11.41. 300 each 3  . insulin aspart (NOVOLOG FLEXPEN) 100 UNIT/ML FlexPen Inject 8 Units into the skin 3 (three) times daily with meals. Requires office visit before any further refills can be given. 15 mL 2  . Insulin Pen Needle (BD PEN NEEDLE NANO U/F) 32G X 4 MM MISC Use as directed to inject Tresiba SQ QD. 100 each 1  . Lancets MISC Dispense based on patient and insurance preference. Use to monitor FSBS 3x daily. Dx: E11.41. 300 each 3  . metFORMIN (GLUCOPHAGE) 1000 MG tablet TAKE 1 TABLET BY MOUTH 2 TIMES DAILY WITH A MEAL. 180 tablet 2  . omeprazole  (PRILOSEC) 20 MG capsule TAKE 1 CAPSULE BY MOUTH 2 TIMES DAILY. (Patient taking differently: Take 20 mg by mouth 2 (two) times daily before a meal. ) 180 capsule 2  . oxybutynin (DITROPAN-XL) 5 MG 24 hr tablet Take 1 tablet (5 mg total) by mouth at bedtime. 90 tablet 2  . polyethylene glycol (MIRALAX / GLYCOLAX) packet Take 17 g by mouth daily as needed for mild constipation. (Patient taking differently: Take 17 g by mouth daily as needed for mild constipation. Mix in 8 oz liquid and drink) 14 each 0  .  TRESIBA FLEXTOUCH 100 UNIT/ML SOPN FlexTouch Pen INJECT 60 UNITS INTO THE SKIN EVERY DAY AT 10 PM. NEED OFFICE VISIT FOR MORE REFILLS 15 mL 0   No current facility-administered medications for this visit.     Allergies:   Sulfonamide derivatives   Social History:  The patient  reports that she has never smoked. She has never used smokeless tobacco. She reports that she does not drink alcohol or use drugs.   Family History:  The patient's family history includes Brain cancer in her brother; Breast cancer in her mother and sister; Cancer in her mother; Heart attack (age of onset: 31) in her father; Lung cancer in her brother and brother; Lung cancer (age of onset: 76) in her brother.  ROS:  Please see the history of present illness.   All other systems are reviewed and otherwise negative.   PHYSICAL EXAM:  VS:  BP 112/62   Pulse 60   Ht 5' 2"  (1.575 m)   Wt 198 lb (89.8 kg)   BMI 36.21 kg/m  BMI: Body mass index is 36.21 kg/m. Well nourished, well developed, in no acute distress  HEENT: normocephalic, atraumatic  Neck: no JVD, carotid bruits or masses Cardiac:  RRR; no significant murmurs, no rubs, or gallops Lungs:  CTA b/l, no wheezing, rhonchi or rales  Abd: soft, nontender MS: no deformity or atrophy Ext:  no edema  Skin: warm and dry, no rash Neuro:  No gross deficits appreciated Psych: euthymic mood, full affect  PPM site is stable, no tethering or discomfort   EKG:  Not  done today PPM interrogation done today and reviewed by myself: battery and lead measurements are good.  She has had CLS driven A pacing.  Also noted 192 mode switch episodes though not long enough to trigger a recording   12/13/16: TTE Study Conclusions - Left ventricle: The cavity size was normal. Wall thickness was increased in a pattern of moderate LVH. Systolic function was normal. The estimated ejection fraction was in the range of 55% to 60%. Normal GLPSS at -21%. Wall motion was normal; there were no regional wall motion abnormalities. Doppler parameters are consistent with abnormal left ventricular relaxation (grade 1 diastolic dysfunction). The E/e&' ratio is between 8-15, suggesting indeterminate LV filling pressure. - Aortic valve: Trileaflet. Sclerosis without stenosis. There was no regurgitation. - Tricuspid valve: There was mild regurgitation. - Pulmonary arteries: PA peak pressure: 34 mm Hg (S). - Inferior vena cava: The vessel was normal in size. The respirophasic diameter changes were in the normal range (>= 50%), consistent with normal central venous pressure. Impressions: - Compared to a prior echo in 2016, there are no significant changes.   12/20/12: Impression Exercise Capacity: Lexiscan with no exercise. BP Response: Normal blood pressure response. Clinical Symptoms: No significant symptoms noted. ECG Impression: No significant ST segment change suggestive of ischemia. Comparison with Prior Nuclear Study: No images to compare Overall Impression: Intermediate risk stress nuclear study There is a defect of the apex that appears to be mostly due to breast attenuation but also a very small area of reversible ischemia. . LV Ejection Fraction: 74%. LV Wall Motion: NL LV Function; NL Wall Motion. The apex contracts especially well.  Recent Labs: 08/15/2017: ALT 25; BUN 23; Creat 1.03; Hemoglobin 12.3; Platelets 278; Potassium 5.1;  Sodium 144  08/15/2017: Cholesterol 111; HDL 34; LDL Cholesterol (Calc) 56; Total CHOL/HDL Ratio 3.3; Triglycerides 131   CrCl cannot be calculated (Patient's most recent lab result is older than  the maximum 21 days allowed.).   Wt Readings from Last 3 Encounters:  02/16/18 198 lb (89.8 kg)  08/15/17 204 lb (92.5 kg)  03/28/17 201 lb (91.2 kg)     Other studies reviewed: Additional studies/records reviewed today include: summarized above  ASSESSMENT AND PLAN:  1. PPM     Intact function     No syncope or near syncope since implant, noting some CLS driven A pcaing.  She has had several AMS episodes.  No EGMs.  In d/w rep, this would be any high A rates of 8-32 beats, no recordings until surpasses 32 beats. Given record notes an incidental finding of cva by a historical scan, device was reprogrammed to make EGM with shorter duration.  WE can reprogram this back to nominal if noted not to be AF.  I am not certain of management changes given she has not had any events of any prolonged duration to date  2. HTN     Looks OK, no changes  Orthostatic BP today note minimal dip in BP upon standing that normalized at her 3 minute BP, not symptoms Encouraged to keep adequately hydrated, no other changes given such a marked improvement in symptoms over this last year.   3. CAD     No anginal complaints, she reports her PMD is managing her lipids, and they are working hard on her DM as well.      On ASA, statin/zetia    Disposition: F/u with every 3 month remotes.  The patient has moved to [place], and inquire if they would be able to move her in-clinic follow-up to there.  We will have her see Dr. Rayann Heman in [place] next year, sooner if needed.    Current medicines are reviewed at length with the patient today.  The patient did not have any concerns regarding medicines.  Venetia Night, PA-C 02/16/2018 11:47 AM     [facility] [place]  [phone] (office)  [phone] (fax)

## 2018-03-06 ENCOUNTER — Other Ambulatory Visit: Payer: Self-pay | Admitting: Family Medicine

## 2018-03-06 DIAGNOSIS — E0841 Diabetes mellitus due to underlying condition with diabetic mononeuropathy: Secondary | ICD-10-CM

## 2018-03-15 ENCOUNTER — Other Ambulatory Visit: Payer: Self-pay | Admitting: Family Medicine

## 2018-03-27 ENCOUNTER — Inpatient Hospital Stay: Payer: Medicare Other | Attending: Hematology and Oncology | Admitting: Hematology and Oncology

## 2018-03-27 ENCOUNTER — Ambulatory Visit (INDEPENDENT_AMBULATORY_CARE_PROVIDER_SITE_OTHER): Payer: Medicare Other | Admitting: *Deleted

## 2018-03-27 DIAGNOSIS — C50412 Malignant neoplasm of upper-outer quadrant of left female breast: Secondary | ICD-10-CM | POA: Diagnosis not present

## 2018-03-27 DIAGNOSIS — Z950 Presence of cardiac pacemaker: Secondary | ICD-10-CM

## 2018-03-27 DIAGNOSIS — Z7982 Long term (current) use of aspirin: Secondary | ICD-10-CM | POA: Insufficient documentation

## 2018-03-27 DIAGNOSIS — Z79811 Long term (current) use of aromatase inhibitors: Secondary | ICD-10-CM | POA: Diagnosis not present

## 2018-03-27 DIAGNOSIS — Z794 Long term (current) use of insulin: Secondary | ICD-10-CM | POA: Insufficient documentation

## 2018-03-27 DIAGNOSIS — Z79899 Other long term (current) drug therapy: Secondary | ICD-10-CM

## 2018-03-27 DIAGNOSIS — R55 Syncope and collapse: Secondary | ICD-10-CM

## 2018-03-27 DIAGNOSIS — Z882 Allergy status to sulfonamides status: Secondary | ICD-10-CM | POA: Diagnosis not present

## 2018-03-27 DIAGNOSIS — Z9012 Acquired absence of left breast and nipple: Secondary | ICD-10-CM | POA: Insufficient documentation

## 2018-03-27 DIAGNOSIS — Z170 Estrogen receptor positive status [ER+]: Secondary | ICD-10-CM | POA: Insufficient documentation

## 2018-03-27 NOTE — Assessment & Plan Note (Signed)
Left breast invasive ductal carcinoma with DCIS T1, N0, M0 stage IA ER/PR positive HER-2 negative status post right mastectomy currently on adjuvant hormonal therapy with Aromasin since 01/11/2013.  Aromasin toxicities: She is tolerating it very well without any major problems or concerns. Denies any hot flashes,achiness,denies any vaginal bleeding or muscle aches or pains.  Breast cancerSurveillance:  1. Breast exam 10/15/2019is normal 2. Mammogram: need to be done. I sent an order for mammograms. 3. Bone density showed a T score of -0.9. Done on 07/02/2014. This will be repeated in 2018. Patient will call her primary care physician to get another bone density test  Priorhospitalization for syncope: Resolved since she had a pacemaker Brain MRI 72,018: No acute intracranial process, few remote left cerebellar infarcts  I sent a new prescription for Aromasin today.  Return to clinic in 1 year for follow-up breast exams.

## 2018-03-27 NOTE — Progress Notes (Signed)
Patient Care Team: [facility], Modena Nunnery, MD as PCP - General (Family Medicine)  DIAGNOSIS:  Encounter Diagnosis  Name Primary?  . Malignant neoplasm of upper-outer quadrant of left breast in female, estrogen receptor positive ([place])     SUMMARY OF ONCOLOGIC HISTORY:   Malignant neoplasm of upper-outer quadrant of left female breast ([place])   11/30/2012 Initial Diagnosis    Malignant neoplasm of upper-outer quadrant of left female breast: Left breast bloody nipple discharge that led to mammogram. Initial biopsy revealed DCIS ER/PR positive    12/24/2012 Surgery    Left mastectomy with sentinel lymph node biopsy. Invasive ductal carcinoma grade 2 with 2 foci each measuring less than 0.1 cm with high-grade DCIS 2.6 cm and 1.4 cm 6 sentinel nodes negative ER 100% PR 100% HER-2 negative Ki-67 14%    01/31/2013 - 03/27/2018 Anti-estrogen oral therapy    Aromasin 25 mg once daily     CHIEF COMPLIANT: Follow-up on Aromasin therapy INTERVAL HISTORY: Kristen [name] is a 72 year old with above-mentioned his left breast cancer treated with mastectomy followed by antiestrogen therapy with Aromasin.  She is completed 5 years of therapy.  She is tolerating Aromasin extremely well.  She denies any lumps or nodules in the breast.  REVIEW OF SYSTEMS:   Constitutional: Denies fevers, chills or abnormal weight loss Eyes: Denies blurriness of vision Ears, nose, mouth, throat, and face: Denies mucositis or sore throat Respiratory: Denies cough, dyspnea or wheezes Cardiovascular: Denies palpitation, chest discomfort Gastrointestinal:  Denies nausea, heartburn or change in bowel habits Skin: Denies abnormal skin rashes Lymphatics: Denies new lymphadenopathy or easy bruising Neurological:Denies numbness, tingling or new weaknesses Behavioral/Psych: Mood is stable, no new changes  Extremities: No lower extremity edema Breast:  denies any pain or lumps or nodules  All other systems were reviewed with the  patient and are negative.  I have reviewed the past medical history, past surgical history, social history and family history with the patient and they are unchanged from previous note.  ALLERGIES:  is allergic to sulfonamide derivatives.  MEDICATIONS:  Current Outpatient Medications  Medication Sig Dispense Refill  . amLODipine (NORVASC) 5 MG tablet TAKE ONE TABLET BY MOUTH EVERY DAY 90 tablet 0  . aspirin EC 81 MG tablet Take 81 mg by mouth daily.    Marland Kitchen atorvastatin (LIPITOR) 40 MG tablet TAKE ONE TABLET BY MOUTH EVERY NIGHT AT BEDTIME 90 tablet 0  . blood glucose meter kit and supplies KIT Dispense based on patient and insurance preference. Use to monitor FSBS 3x daily. Dx: E11.41. 1 each 0  . ezetimibe (ZETIA) 10 MG tablet TAKE ONE TABLET BY MOUTH EVERY DAY 90 tablet 0  . FLUoxetine (PROZAC) 40 MG capsule Take 1 capsule (40 mg total) by mouth daily. 90 capsule 2  . gabapentin (NEURONTIN) 300 MG capsule TAKE ONE CAPSULE BY MOUTH 2 TIMES A DAY 180 capsule 0  . glucose blood test strip Dispense based on patient and insurance preference. Use to monitor FSBS 3x daily. Dx: E11.41. 300 each 3  . insulin aspart (NOVOLOG FLEXPEN) 100 UNIT/ML FlexPen Inject 8 Units into the skin 3 (three) times daily with meals. Requires office visit before any further refills can be given. 15 mL 2  . Insulin Pen Needle (BD PEN NEEDLE NANO U/F) 32G X 4 MM MISC Use as directed to inject Tresiba SQ QD. 100 each 1  . Lancets MISC Dispense based on patient and insurance preference. Use to monitor FSBS 3x daily. Dx: E11.41. 300 each  3  . metFORMIN (GLUCOPHAGE) 1000 MG tablet TAKE 1 TABLET BY MOUTH 2 TIMES DAILY WITH A MEAL. 180 tablet 0  . omeprazole (PRILOSEC) 20 MG capsule TAKE 1 CAPSULE BY MOUTH 2 TIMES DAILY. (Patient taking differently: Take 20 mg by mouth 2 (two) times daily before a meal. ) 180 capsule 2  . oxybutynin (DITROPAN-XL) 5 MG 24 hr tablet TAKE ONE TABLET BY MOUTH EVERY NIGHT AT BEDTIME 90 tablet 0  .  polyethylene glycol (MIRALAX / GLYCOLAX) packet Take 17 g by mouth daily as needed for mild constipation. (Patient taking differently: Take 17 g by mouth daily as needed for mild constipation. Mix in 8 oz liquid and drink) 14 each 0  . TRESIBA FLEXTOUCH 100 UNIT/ML SOPN FlexTouch Pen INJECT 60 UNITS INTO THE SKIN EVERY DAY AT 10 PM. NEED OFFICE VISIT FOR MORE REFILLS 15 mL 0   No current facility-administered medications for this visit.     PHYSICAL EXAMINATION: ECOG PERFORMANCE STATUS: 1 - Symptomatic but completely ambulatory  Vitals:   03/27/18 1446  BP: 112/67  Pulse: (!) 102  Resp: 17  Temp: 98.6 F (37 C)  SpO2: 97%   Filed Weights   03/27/18 1446  Weight: 196 lb 8 oz (89.1 kg)    GENERAL:alert, no distress and comfortable SKIN: skin color, texture, turgor are normal, no rashes or significant lesions EYES: normal, Conjunctiva are pink and non-injected, sclera clear OROPHARYNX:no exudate, no erythema and lips, buccal mucosa, and tongue normal  NECK: supple, thyroid normal size, non-tender, without nodularity LYMPH:  no palpable lymphadenopathy in the cervical, axillary or inguinal LUNGS: clear to auscultation and percussion with normal breathing effort HEART: regular rate & rhythm and no murmurs and no lower extremity edema ABDOMEN:abdomen soft, non-tender and normal bowel sounds MUSCULOSKELETAL:no cyanosis of digits and no clubbing  NEURO: alert & oriented x 3 with fluent speech, no focal motor/sensory deficits EXTREMITIES: No lower extremity edema BREAST: Left mastectomy no palpable lumps or nodules ingestible axilla.  Right breast no lumps or nodules.  (exam performed in the presence of a chaperone)  LABORATORY DATA:  I have reviewed the data as listed CMP Latest Ref Rng & Units 08/15/2017 01/03/2017 12/22/2016  Glucose 65 - 99 mg/dL 145(H) 115(H) 242(H)  BUN 7 - 25 mg/dL 23 24 22(H)  Creatinine 0.60 - 0.93 mg/dL 1.03(H) 0.89 1.14(H)  Sodium 135 - 146 mmol/L 144 137 135   Potassium 3.5 - 5.3 mmol/L 5.1 4.9 4.1  Chloride 98 - 110 mmol/L 106 98 99(L)  CO2 20 - 32 mmol/L _0 Calcium 8.6 - 10.4 mg/dL 9.7 9.7 9.2  Total Protein 6.1 - 8.1 g/dL 7.0 - -  Total Bilirubin 0.2 - 1.2 mg/dL 0.4 - -  Alkaline Phos 38 - 126 U/L - - -  AST 10 - 35 U/L 16 - -  ALT 6 - 29 U/L 25 - -    Lab Results  Component Value Date   WBC 7.8 08/15/2017   HGB 12.3 08/15/2017   HCT 37.5 08/15/2017   MCV 84.1 08/15/2017   PLT 278 08/15/2017   NEUTROABS 3,783 08/15/2017    ASSESSMENT & PLAN:  Malignant neoplasm of upper-outer quadrant of left female breast Left breast invasive ductal carcinoma with DCIS T1, N0, M0 stage IA ER/PR positive HER-2 negative status post right mastectomy currently on adjuvant hormonal therapy with Aromasin since 01/11/2013.  Aromasin toxicities: She is tolerating it very well without any major problems or concerns. Denies any hot  flashes,achiness,denies any vaginal bleeding or muscle aches or pains. Since the patient completed 5 years of therapy and given the fact that she has very favorable breast cancer, I recommended that she can now discontinue her antiestrogen therapy.   Breast cancerSurveillance:  1. Breast exam 10/15/2019is normal 2. Mammogram: need to be done. I sent an order for mammograms. 3. Bone density showed a T score of -0.9. Done on 07/02/2014. This will be repeated in 2018. Patient will call her primary care physician to get another bone density test  Priorhospitalization for syncope: Resolved since she had a pacemaker Brain MRI 72,018: No acute intracranial process, few remote left cerebellar infarcts  Return to clinic in 1 year for follow-up breast exams in the long-term survivorship clinic.    No orders of the defined types were placed in this encounter.  The patient has a good understanding of the overall plan. she agrees with it. she will call with any problems that may develop before the next visit here.    Harriette Ohara, MD 03/27/18

## 2018-03-28 NOTE — Progress Notes (Signed)
Remote pacemaker transmission.   

## 2018-03-29 ENCOUNTER — Other Ambulatory Visit: Payer: Self-pay | Admitting: Family Medicine

## 2018-04-10 ENCOUNTER — Other Ambulatory Visit: Payer: Self-pay | Admitting: Family Medicine

## 2018-04-23 ENCOUNTER — Other Ambulatory Visit: Payer: Self-pay | Admitting: Family Medicine

## 2018-05-21 ENCOUNTER — Other Ambulatory Visit: Payer: Self-pay | Admitting: *Deleted

## 2018-05-21 MED ORDER — TRESIBA FLEXTOUCH 100 UNIT/ML ~~LOC~~ SOPN: PEN_INJECTOR | SUBCUTANEOUS | 0 refills | Status: DC

## 2018-05-23 ENCOUNTER — Other Ambulatory Visit: Payer: Self-pay | Admitting: *Deleted

## 2018-05-23 MED ORDER — FLUTICASONE PROPIONATE 50 MCG/ACT NA SUSP: 2.0000 | Freq: Every day | NASAL | 6 refills | Status: DC

## 2018-05-30 ENCOUNTER — Other Ambulatory Visit: Payer: Self-pay | Admitting: *Deleted

## 2018-05-30 MED ORDER — FLUTICASONE PROPIONATE 50 MCG/ACT NA SUSP: 2.0000 | Freq: Every day | NASAL | 3 refills | Status: DC

## 2018-06-02 LAB — CUP PACEART REMOTE DEVICE CHECK
Date Time Interrogation Session: 20191221201442
Implantable Lead Implant Date: 20180709
Implantable Lead Implant Date: 20180709
Implantable Lead Location: 753859
Implantable Lead Location: 753860
Implantable Lead Model: 5076
Implantable Lead Model: 5076
MDC IDC PG IMPLANT DT: 20180709
Pulse Gen Model: 407145
Pulse Gen Serial Number: 69106657

## 2018-06-21 ENCOUNTER — Telehealth: Payer: Self-pay | Admitting: Family Medicine

## 2018-06-21 NOTE — Telephone Encounter (Signed)
Patient calling to say that she would like any prescriptions to be sent to [facility]  Phone [phone] Fax [phone]

## 2018-06-21 NOTE — Telephone Encounter (Signed)
Noted  

## 2018-06-26 ENCOUNTER — Other Ambulatory Visit: Payer: Self-pay | Admitting: *Deleted

## 2018-06-26 ENCOUNTER — Ambulatory Visit (INDEPENDENT_AMBULATORY_CARE_PROVIDER_SITE_OTHER): Payer: Medicare HMO

## 2018-06-26 DIAGNOSIS — R55 Syncope and collapse: Secondary | ICD-10-CM | POA: Diagnosis not present

## 2018-06-26 DIAGNOSIS — E0841 Diabetes mellitus due to underlying condition with diabetic mononeuropathy: Secondary | ICD-10-CM

## 2018-06-26 MED ORDER — GLUCOSE BLOOD VI STRP: ORAL_STRIP | 3 refills | Status: DC

## 2018-06-26 MED ORDER — METFORMIN HCL 1000 MG PO TABS: 500.0000 mg | ORAL_TABLET | Freq: Two times a day (BID) | ORAL | 3 refills | Status: DC

## 2018-06-26 MED ORDER — FLUTICASONE PROPIONATE 50 MCG/ACT NA SUSP: 2.0000 | Freq: Every day | NASAL | 3 refills | Status: DC

## 2018-06-26 MED ORDER — GABAPENTIN 300 MG PO CAPS: ORAL_CAPSULE | ORAL | 3 refills | Status: DC

## 2018-06-26 MED ORDER — OXYBUTYNIN CHLORIDE ER 5 MG PO TB24: 5.0000 mg | ORAL_TABLET | Freq: Every day | ORAL | 3 refills | Status: DC

## 2018-06-26 MED ORDER — TRESIBA FLEXTOUCH 100 UNIT/ML ~~LOC~~ SOPN: PEN_INJECTOR | SUBCUTANEOUS | 3 refills | Status: DC

## 2018-06-26 MED ORDER — ATORVASTATIN CALCIUM 40 MG PO TABS: 40.0000 mg | ORAL_TABLET | Freq: Every day | ORAL | 3 refills | Status: DC

## 2018-06-26 MED ORDER — BLOOD GLUCOSE SYSTEM PAK KIT: PACK | 1 refills | Status: DC

## 2018-06-26 MED ORDER — INSULIN ASPART 100 UNIT/ML FLEXPEN: 8.0000 [IU] | PEN_INJECTOR | Freq: Three times a day (TID) | SUBCUTANEOUS | 2 refills | Status: DC

## 2018-06-27 NOTE — Progress Notes (Signed)
Remote pacemaker transmission.   

## 2018-06-29 LAB — CUP PACEART REMOTE DEVICE CHECK
Date Time Interrogation Session: 20200117202724
Implantable Lead Implant Date: 20180709
Implantable Lead Implant Date: 20180709
Implantable Lead Location: 753859
Implantable Lead Location: 753860
Implantable Lead Model: 5076
Implantable Lead Model: 5076
MDC IDC PG IMPLANT DT: 20180709
Pulse Gen Model: 407145
Pulse Gen Serial Number: 69106657

## 2018-07-10 ENCOUNTER — Telehealth: Payer: Self-pay | Admitting: Family Medicine

## 2018-07-10 MED ORDER — AMLODIPINE BESYLATE 5 MG PO TABS: 5.0000 mg | ORAL_TABLET | Freq: Every day | ORAL | 0 refills | Status: DC

## 2018-07-10 NOTE — Telephone Encounter (Signed)
Prescription sent to pharmacy.

## 2018-07-10 NOTE — Telephone Encounter (Signed)
Refill on amlodipine to [facility] order

## 2018-07-23 ENCOUNTER — Other Ambulatory Visit: Payer: Self-pay | Admitting: *Deleted

## 2018-07-23 DIAGNOSIS — E0841 Diabetes mellitus due to underlying condition with diabetic mononeuropathy: Secondary | ICD-10-CM

## 2018-07-23 MED ORDER — ATORVASTATIN CALCIUM 40 MG PO TABS: 40.0000 mg | ORAL_TABLET | Freq: Every day | ORAL | 3 refills | Status: DC

## 2018-07-23 MED ORDER — INSULIN PEN NEEDLE 32G X 4 MM MISC: 0 refills | Status: DC

## 2018-07-23 MED ORDER — METFORMIN HCL 1000 MG PO TABS: 500.0000 mg | ORAL_TABLET | Freq: Two times a day (BID) | ORAL | 3 refills | Status: DC

## 2018-07-23 MED ORDER — GABAPENTIN 300 MG PO CAPS: ORAL_CAPSULE | ORAL | 3 refills | Status: DC

## 2018-08-22 ENCOUNTER — Other Ambulatory Visit: Payer: Self-pay | Admitting: *Deleted

## 2018-08-22 MED ORDER — EZETIMIBE 10 MG PO TABS: 10.0000 mg | ORAL_TABLET | Freq: Every day | ORAL | 0 refills | Status: DC

## 2018-09-03 ENCOUNTER — Other Ambulatory Visit: Payer: Self-pay | Admitting: *Deleted

## 2018-09-03 MED ORDER — METFORMIN HCL 1000 MG PO TABS: 1000.0000 mg | ORAL_TABLET | Freq: Two times a day (BID) | ORAL | 0 refills | Status: DC

## 2018-09-07 ENCOUNTER — Other Ambulatory Visit: Payer: Self-pay | Admitting: Family Medicine

## 2018-09-20 ENCOUNTER — Other Ambulatory Visit: Payer: Self-pay | Admitting: Family Medicine

## 2018-09-25 ENCOUNTER — Other Ambulatory Visit: Payer: Self-pay

## 2018-09-25 ENCOUNTER — Ambulatory Visit (INDEPENDENT_AMBULATORY_CARE_PROVIDER_SITE_OTHER): Payer: Medicare HMO | Admitting: *Deleted

## 2018-09-25 DIAGNOSIS — R55 Syncope and collapse: Secondary | ICD-10-CM | POA: Diagnosis not present

## 2018-09-25 LAB — CUP PACEART REMOTE DEVICE CHECK
Date Time Interrogation Session: 20200414205236
Implantable Lead Implant Date: 20180709
Implantable Lead Implant Date: 20180709
Implantable Lead Location: 753859
Implantable Lead Location: 753860
Implantable Lead Model: 5076
Implantable Lead Model: 5076
Implantable Pulse Generator Implant Date: 20180709
Pulse Gen Model: 407145
Pulse Gen Serial Number: 69106657

## 2018-09-27 ENCOUNTER — Other Ambulatory Visit: Payer: Self-pay | Admitting: *Deleted

## 2018-09-27 MED ORDER — LANCETS MISC: 3 refills | Status: DC

## 2018-10-02 ENCOUNTER — Encounter: Payer: Self-pay | Admitting: Cardiology

## 2018-10-02 NOTE — Progress Notes (Signed)
Remote pacemaker transmission.   

## 2018-10-06 DIAGNOSIS — M79602 Pain in left arm: Secondary | ICD-10-CM | POA: Diagnosis not present

## 2018-10-06 DIAGNOSIS — E119 Type 2 diabetes mellitus without complications: Secondary | ICD-10-CM | POA: Diagnosis not present

## 2018-10-06 DIAGNOSIS — Z79899 Other long term (current) drug therapy: Secondary | ICD-10-CM | POA: Diagnosis not present

## 2018-10-06 DIAGNOSIS — Z950 Presence of cardiac pacemaker: Secondary | ICD-10-CM | POA: Diagnosis not present

## 2018-10-06 DIAGNOSIS — I200 Unstable angina: Secondary | ICD-10-CM | POA: Diagnosis not present

## 2018-10-06 DIAGNOSIS — R079 Chest pain, unspecified: Secondary | ICD-10-CM | POA: Diagnosis not present

## 2018-10-06 DIAGNOSIS — Z7982 Long term (current) use of aspirin: Secondary | ICD-10-CM | POA: Diagnosis not present

## 2018-10-06 DIAGNOSIS — I10 Essential (primary) hypertension: Secondary | ICD-10-CM | POA: Diagnosis not present

## 2018-10-07 DIAGNOSIS — E119 Type 2 diabetes mellitus without complications: Secondary | ICD-10-CM | POA: Diagnosis not present

## 2018-10-07 DIAGNOSIS — I10 Essential (primary) hypertension: Secondary | ICD-10-CM | POA: Diagnosis not present

## 2018-10-07 DIAGNOSIS — R079 Chest pain, unspecified: Secondary | ICD-10-CM | POA: Diagnosis not present

## 2018-10-10 ENCOUNTER — Telehealth: Payer: Self-pay | Admitting: Internal Medicine

## 2018-10-10 NOTE — Telephone Encounter (Signed)
I had a message to make fu appt after d/c from [facility]. However she has a  hospital fu with [facility] for Cardiology hospital fu 10-16-18. Can she have this appt and they refer her if needed, or do want to hae an appt with you as well? Pls advise

## 2018-10-12 ENCOUNTER — Telehealth: Payer: Self-pay

## 2018-10-12 ENCOUNTER — Other Ambulatory Visit: Payer: Self-pay | Admitting: Family Medicine

## 2018-10-12 ENCOUNTER — Telehealth (INDEPENDENT_AMBULATORY_CARE_PROVIDER_SITE_OTHER): Payer: Medicare HMO | Admitting: Internal Medicine

## 2018-10-12 DIAGNOSIS — I441 Atrioventricular block, second degree: Secondary | ICD-10-CM

## 2018-10-12 DIAGNOSIS — Z950 Presence of cardiac pacemaker: Secondary | ICD-10-CM

## 2018-10-12 DIAGNOSIS — R0789 Other chest pain: Secondary | ICD-10-CM | POA: Diagnosis not present

## 2018-10-12 DIAGNOSIS — I2510 Atherosclerotic heart disease of native coronary artery without angina pectoris: Secondary | ICD-10-CM

## 2018-10-12 DIAGNOSIS — R079 Chest pain, unspecified: Secondary | ICD-10-CM

## 2018-10-12 DIAGNOSIS — I10 Essential (primary) hypertension: Secondary | ICD-10-CM

## 2018-10-12 NOTE — Progress Notes (Signed)
Electrophysiology TeleHealth Note   Due to national recommendations of social distancing due to [place] 19, an audio telehealth visit is felt to be most appropriate for this patient at this time.  Verbal consent obtained from the patient today.  She does not have a smart phone or internet technology and therefore cannot perform virtual visit today.   Date:  10/12/2018   ID:  Kristen [name], DOB 11/05/1945, MRN [id number]  Location: patient's home  Provider location: [address], [place]  Evaluation Performed: Follow-up visit  PCP:  Alycia Rossetti, MD   Electrophysiologist:  Dr Caryl Comes  Chief Complaint:  Chest pain  History of Present Illness:    Kristen [name] is a 73 y.o. female who presents via audio conferencing for a telehealth visit today.  She has previously been followed by Dr Caryl Comes.  She reports that this past Saturday that she developed substernal sharp chest pain.  She went to [facility] and was evaluated.  She was discharged to outpatient follow-up.  She has done well since that time. Today, she denies symptoms of palpitations, exertional chest pain, shortness of breath,  lower extremity edema, dizziness, presyncope, or syncope.  The patient is otherwise without complaint today.  The patient denies symptoms of fevers, chills, cough, or new SOB worrisome for COVID 19.  Past Medical History:  Diagnosis Date   Allergy    Anemia 02/15/2009   Qualifier: Diagnosis of  By: Orville Govern, CMA, Carol     Anxiety    Arthritis    Bifascicular block    Blood transfusion without reported diagnosis    Breast cancer ([place])    breast-left    CAD (coronary artery disease) 02/15/2009   Qualifier: Diagnosis of  By: Orville Govern, CMA, Carol     Coronary artery disease, non-occlusive    a. Left heart cath 01/2009 showed 40% LAD, 40% D2, 50% distal LAD, 20-30% AV cx, 20-30% luminal Cx, 30% prox RCA, 40-50% mRCA, LVEF normal. b. Intermediate risk nuc 2014 - ? breast attn versus small  area of reversible ischemia, EF 74%.   Depression    Diabetes mellitus    Diabetic neuropathy ([place]) 07/24/2013   DM (diabetes mellitus), secondary uncontrolled ([place]) 02/11/2015   Essential hypertension 02/15/2009   Qualifier: Diagnosis of  By: Orville Govern CMA, Carol     GERD (gastroesophageal reflux disease)    Hemorrhoids    Hyperkalemia 02/11/2015   Hyperlipidemia    Hypertension    Malignant neoplasm of upper-outer quadrant of left female breast ([place]) 11/30/2012   Nephrolithiasis    Obesity    Overactive bladder    Personal history of colonic polyps 02/12/2016   09/2010 - 2 diminutive adenomas 02/12/2016 5 mm descending polyp - prolapse type polyp (not precancerous) no recall needed given hx, findings and age    RBBB 02/20/2009   Qualifier: Diagnosis of  By: Verl Blalock, MD, Estevan Oaks C    Right bundle blanch block, 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 MRI 12/2016 - remote left cerebellar infarcts incidentally noted.   Syncope 02/11/2015   Vitamin D deficiency     Past Surgical History:  Procedure Laterality Date   APPENDECTOMY     BACK SURGERY     BREAST SURGERY     CARDIAC CATHETERIZATION  01/2009   cath by Dr Lia Foyer revealed nonobstructive CAD   CESAREAN SECTION  CHOLECYSTECTOMY     CHOLECYSTECTOMY, LAPAROSCOPIC     COLONOSCOPY     last 2012- with polyps   JOINT REPLACEMENT     LITHOTRIPSY     MASTECTOMY W/ SENTINEL NODE BIOPSY Left 12/24/2012   Procedure: LEFT MASTECTOMY WITH LEFT SENTINEL LYMPH NODE BIOPSY;  Surgeon: Rolm Bookbinder, MD;  Location: [facility];  Service: General;  Laterality: Left;   OOPHORECTOMY Right    1.5  ovaries rem   PACEMAKER IMPLANT N/A 12/19/2016   Procedure: Pacemaker Implant;  Surgeon: Deboraha Sprang, MD;  Location: Home CV LAB;  Service: Cardiovascular;  Laterality: N/A;   PARTIAL HIP ARTHROPLASTY     left  hip   POLYPECTOMY     REPLACEMENT TOTAL KNEE     both knees   TONSILLECTOMY     TOTAL MASTECTOMY Left 12/24/2012   Dr Donne Hazel    Current Outpatient Medications  Medication Sig Dispense Refill   amLODipine (NORVASC) 5 MG tablet TAKE 1 TABLET EVERY DAY 90 tablet 0   aspirin EC 81 MG tablet Take 81 mg by mouth daily.     atorvastatin (LIPITOR) 40 MG tablet Take 1 tablet (40 mg total) by mouth at bedtime. 90 tablet 3   blood glucose meter kit and supplies KIT Dispense based on patient and insurance preference. Use to monitor FSBS 3x daily. Dx: E11.41. 1 each 0   Blood Glucose Monitoring Suppl (BLOOD GLUCOSE SYSTEM PAK) KIT Dispense based on patient and insurance preference. (Accu-Chek Aviva Plus) Use to monitor FSBS 3x daily. Dx: E11.41. 1 each 1   ezetimibe (ZETIA) 10 MG tablet Take 1 tablet (10 mg total) by mouth daily. 90 tablet 0   FLUoxetine (PROZAC) 40 MG capsule Take 1 capsule (40 mg total) by mouth daily. 90 capsule 2   fluticasone (FLONASE) 50 MCG/ACT nasal spray Place 2 sprays into both nostrils daily. 48 g 3   gabapentin (NEURONTIN) 300 MG capsule TAKE ONE CAPSULE BY MOUTH 2 TIMES A DAY 180 capsule 3   glucose blood test strip Dispense based on patient and insurance preference. (Accu-Chek Aviva Plus) Use to monitor FSBS 3x daily. Dx: E11.41. 300 each 3   Insulin Pen Needle (UNIFINE PENTIPS) 32G X 4 MM MISC USE TO INJECT TRESIBA AND NOVOLOG EVERY DAY AS DIRECTED 100 each 0   isosorbide mononitrate (IMDUR) 30 MG 24 hr tablet Take 1 tablet by mouth daily.     Lancets MISC Dispense based on patient and insurance preference. Use to monitor FSBS 3x daily. Dx: E11.41. 300 each 3   metFORMIN (GLUCOPHAGE) 1000 MG tablet Take 1 tablet (1,000 mg total) by mouth 2 (two) times daily with a meal. 360 tablet 0   NOVOLOG FLEXPEN 100 UNIT/ML FlexPen INJECT 8 UNITS SUBCUTANEOUSLY THREE TIMES DAILY WITH MEALS ( NEED MD APPOINTMENT FOR REFILLS ) 30 mL 0   omeprazole (PRILOSEC) 20  MG capsule TAKE 1 CAPSULE BY MOUTH 2 TIMES DAILY. (Patient taking differently: Take 20 mg by mouth 2 (two) times daily before a meal. ) 180 capsule 2   oxybutynin (DITROPAN-XL) 5 MG 24 hr tablet Take 1 tablet (5 mg total) by mouth at bedtime. 90 tablet 3   TRESIBA FLEXTOUCH 100 UNIT/ML SOPN FlexTouch Pen INJECT 60 UNITS SUBCUTANEOUSLY EVERY DAY ( NEED MD APPOINTMENT FOR REFILLS ) 60 mL 0   No current facility-administered medications for this visit.     Allergies:   Sulfonamide derivatives   Social History:  The patient  reports that she has never  smoked. She has never used smokeless tobacco. She reports that she does not drink alcohol or use drugs.   Family History:  The patient's family history includes Brain cancer in her brother; Breast cancer in her mother and sister; Cancer in her mother; Heart attack (age of onset: 64) in her father; Lung cancer in her brother and brother; Lung cancer (age of onset: 26) in her brother.   ROS:  Please see the history of present illness.   All other systems are personally reviewed and negative.    Exam:    Vital Signs:  There were no vitals taken for this visit.  Well sounding   Labs/Other Tests and Data Reviewed:    Recent Labs: No results found for requested labs within last 8760 hours.   Wt Readings from Last 3 Encounters:  03/27/18 196 lb 8 oz (89.1 kg)  02/16/18 198 lb (89.8 kg)  08/15/17 204 lb (92.5 kg)     Other studies personally reviewed: Additional studies/ records that were reviewed today include: Renee's prior notes, Dr Aquilla Hacker notse,   Review of the above records today demonstrates: as above  She has a h/o mild-moderate nonobstructive CAD by cath 2010,showing 40% LAD, 40% D2, 50% distal LAD, 20-30% AV cx, 20-30% luminal Cx, 30% prox RCA, 40-50% mRCA, LVEF normal. Nuc 12/2012 was intermediate risk - defect of the apex that appears to be mostly due to breast attenuation but also a very small area of reversible ischemia, EF  normal at 74%  Last device remote is reviewed from [place] PDF dated 09/25/2018 which reveals normal device function, no arrhythmias  ekg from 10/06/2018 UNCr revealed sinus with V pacing  ASSESSMENT & PLAN:    1.  Chest pain Both typical and atypical features She has a h/o mild nonobstructive CAD For now, in the setting of COVID 19, we will manage conservatively.  She has had no further symptoms since discharge. Continue ASA and statin Consider further CV risk stratification if her symptoms return Follow-up with me for reassessment in 2 months if symptoms do not return.  2. Second degree AV block She has bifascicular block and h/o syncope and is s/p BTK ppm by Dr Sherril Croon reviewed and are uptodate Normal pacemaker function  3. HTN She does not check her BP at home Note from EP PA in September reveals some orthostatic symptoms in the past I would therefore not advise agressive medical therapy No change required today  4. COVID 19 screen The patient denies symptoms of COVID 19 at this time.  The importance of social distancing was discussed today.  Follow-up:  2 months with me to reassess her chest pain Next remote: 12/2018  Current medicines are reviewed at length with the patient today.   The patient does not have concerns regarding her medicines.  The following changes were made today:  none  Labs/ tests ordered today include:  No orders of the defined types were placed in this encounter.  Patient Risk:  after full review of this patients clinical status, I feel that they are at moderate risk at this time.  Today, I have spent 22 minutes with the patient with telehealth technology discussing chest pain and recent admission to [facility] .    SignedThompson Grayer, MD  10/12/2018 3:41 PM     [address] [place] [address] [phone] (office) [phone] (fax)

## 2018-10-12 NOTE — Telephone Encounter (Signed)
Spoke with pt regarding appt on 10/12/18. Pt stated she does not have access to a smart device and can not check vitals. Pt was advise to keep phone visit. Pt concerns were address.

## 2018-10-12 NOTE — Telephone Encounter (Signed)
Melissa  thx  I can not find records from [facility] in [place]   Do you know where I might find them?  thx  S

## 2018-10-16 ENCOUNTER — Other Ambulatory Visit: Payer: Self-pay

## 2018-10-16 ENCOUNTER — Encounter: Payer: Self-pay | Admitting: Family Medicine

## 2018-10-16 ENCOUNTER — Ambulatory Visit (INDEPENDENT_AMBULATORY_CARE_PROVIDER_SITE_OTHER): Payer: Medicare HMO | Admitting: Family Medicine

## 2018-10-16 VITALS — BP 116/68 | HR 84 | Temp 98.5°F | Resp 16 | Ht 62.0 in | Wt 199.0 lb

## 2018-10-16 DIAGNOSIS — I10 Essential (primary) hypertension: Secondary | ICD-10-CM

## 2018-10-16 DIAGNOSIS — E782 Mixed hyperlipidemia: Secondary | ICD-10-CM | POA: Diagnosis not present

## 2018-10-16 DIAGNOSIS — E1365 Other specified diabetes mellitus with hyperglycemia: Secondary | ICD-10-CM

## 2018-10-16 DIAGNOSIS — IMO0002 Reserved for concepts with insufficient information to code with codable children: Secondary | ICD-10-CM

## 2018-10-16 DIAGNOSIS — I2510 Atherosclerotic heart disease of native coronary artery without angina pectoris: Secondary | ICD-10-CM

## 2018-10-16 DIAGNOSIS — Z1159 Encounter for screening for other viral diseases: Secondary | ICD-10-CM | POA: Diagnosis not present

## 2018-10-16 DIAGNOSIS — F341 Dysthymic disorder: Secondary | ICD-10-CM | POA: Diagnosis not present

## 2018-10-16 DIAGNOSIS — E1143 Type 2 diabetes mellitus with diabetic autonomic (poly)neuropathy: Secondary | ICD-10-CM

## 2018-10-16 NOTE — Progress Notes (Signed)
Subjective:    Patient ID: Kristen [name], female    DOB: 1945-12-09, 73 y.o.   MRN: [id number]  Patient presents for Hosptial f/U (chest pain)  Patient here for hospital follow-up and to follow-up her diabetes mellitus.  Her last visit was in March 2019.  She has not had her A1c checked since then.  She states that her blood sugars have been good.  She did have her meter with her today but states is the new meter there was only 26 readings for the past 90 days. Currently taking Tresiba 60 units and NovoLog 8 units with each meal.     CBG 117  AVG- 179   14 day 182   Admitted to the hospital secondary to anginal chest pain.  Her troponins were negative she was discharged home with a new prescription for Imdur 15 mg but also stated to be used for headache.  The past she was on nitroglycerin this was discontinued secondary concern for blood pressures per her daughter.  They were confused that if this is something that she should continue on.  She did have a tele-visit with her cardiologist however her daughter was not available at that time and Ms. Graffeo does not know her medications.  States that she feels well.  I reviewed all her medications with her daughter at the bedside.  She is due for repeat fasting labs which they are prepared for today.  Review Of Systems:  GEN- denies fatigue, fever, weight loss,weakness, recent illness HEENT- denies eye drainage, change in vision, nasal discharge, CVS- denies chest pain, palpitations RESP- denies SOB, cough, wheeze ABD- denies N/V, change in stools, abd pain GU- denies dysuria, hematuria, dribbling, incontinence MSK- denies joint pain, muscle aches, injury Neuro- denies headache, dizziness, syncope, seizure activity       Objective:    BP 116/68   Pulse 84   Temp 98.5 F (36.9 C) (Oral)   Resp 16   Ht 5\' 2"  (1.575 m)   Wt 199 lb (90.3 kg)   SpO2 97%   BMI 36.40 kg/m  GEN- NAD, alert and oriented x3 HEENT- PERRL, EOMI, non  injected sclera, pink conjunctiva, MMM, oropharynx clear Neck- Supple, no thyromegaly CVS- RRR, no murmur RESP-CTAB ABD-NABS,soft,NT,ND EXT- No edema Pulses- Radial, DP- 2+        Assessment & Plan:      Problem List Items Addressed This Visit      Unprioritized   CAD (coronary artery disease) - Primary    Will reach out to her cardiologist about the Imdur 15 mg.  She will continue for now.  She denies any chest pain or shortness of breath.  Her blood pressures controlled.  I am checking her metabolic panel as well as her lipid panel.      Relevant Orders   CBC with Differential/Platelet   Comprehensive metabolic panel   DEPRESSION/ANXIETY    Continue Prozac.  Per her family member she is doing well with this medication.  She is sleeping good.      Diabetic neuropathy (HCC)    Continue gabapentin.      DM (diabetes mellitus), secondary uncontrolled ([place])    She is at longstanding history of uncontrolled diabetes mellitus and poor follow-up.  Goal is to get her A1c below 8%.  Her fasting blood sugars that she has shown me actually looks fairly good for her.  For now we will continue the Tresiba 60 units and NovoLog 8 units with each meals and  the Januvia      Relevant Orders   Hemoglobin A1c   Lipid panel   Microalbumin / creatinine urine ratio   HM DIABETES FOOT EXAM (Completed)   Essential hypertension   Hyperlipidemia   Relevant Orders   Lipid panel    Other Visit Diagnoses    Need for hepatitis C screening test       Relevant Orders   Hepatitis C antibody      Note: This dictation was prepared with Dragon dictation along with smaller phrase technology. Any transcriptional errors that result from this process are unintentional.

## 2018-10-16 NOTE — Assessment & Plan Note (Signed)
Will reach out to her cardiologist about the Imdur 15 mg.  She will continue for now.  She denies any chest pain or shortness of breath.  Her blood pressures controlled.  I am checking her metabolic panel as well as her lipid panel.

## 2018-10-16 NOTE — Assessment & Plan Note (Signed)
Continue gabapentin.

## 2018-10-16 NOTE — Assessment & Plan Note (Signed)
Continue Prozac.  Per her family member she is doing well with this medication.  She is sleeping good.

## 2018-10-16 NOTE — Patient Instructions (Signed)
F/U 4 months for wellness exam  We will call with lab results

## 2018-10-16 NOTE — Assessment & Plan Note (Signed)
She is at longstanding history of uncontrolled diabetes mellitus and poor follow-up.  Goal is to get her A1c below 8%.  Her fasting blood sugars that she has shown me actually looks fairly good for her.  For now we will continue the Tresiba 60 units and NovoLog 8 units with each meals and the Januvia

## 2018-10-17 ENCOUNTER — Encounter: Payer: Self-pay | Admitting: *Deleted

## 2018-10-17 LAB — COMPREHENSIVE METABOLIC PANEL
AG Ratio: 1.4 (calc) (ref 1.0–2.5)
ALT: 21 U/L (ref 6–29)
AST: 15 U/L (ref 10–35)
Albumin: 4.2 g/dL (ref 3.6–5.1)
Alkaline phosphatase (APISO): 70 U/L (ref 37–153)
BUN: 16 mg/dL (ref 7–25)
CO2: 25 mmol/L (ref 20–32)
Calcium: 9.6 mg/dL (ref 8.6–10.4)
Chloride: 104 mmol/L (ref 98–110)
Creat: 0.85 mg/dL (ref 0.60–0.93)
Globulin: 2.9 g/dL (calc) (ref 1.9–3.7)
Glucose, Bld: 130 mg/dL — ABNORMAL HIGH (ref 65–99)
Potassium: 5.3 mmol/L (ref 3.5–5.3)
Sodium: 142 mmol/L (ref 135–146)
Total Bilirubin: 0.3 mg/dL (ref 0.2–1.2)
Total Protein: 7.1 g/dL (ref 6.1–8.1)

## 2018-10-17 LAB — CBC WITH DIFFERENTIAL/PLATELET
Absolute Monocytes: 677 cells/uL (ref 200–950)
Basophils Absolute: 43 cells/uL (ref 0–200)
Basophils Relative: 0.7 %
Eosinophils Absolute: 281 cells/uL (ref 15–500)
Eosinophils Relative: 4.6 %
HCT: 40.5 % (ref 35.0–45.0)
Hemoglobin: 13.3 g/dL (ref 11.7–15.5)
Lymphs Abs: 2410 cells/uL (ref 850–3900)
MCH: 30 pg (ref 27.0–33.0)
MCHC: 32.8 g/dL (ref 32.0–36.0)
MCV: 91.4 fL (ref 80.0–100.0)
MPV: 10.5 fL (ref 7.5–12.5)
Monocytes Relative: 11.1 %
Neutro Abs: 2690 cells/uL (ref 1500–7800)
Neutrophils Relative %: 44.1 %
Platelets: 272 10*3/uL (ref 140–400)
RBC: 4.43 10*6/uL (ref 3.80–5.10)
RDW: 13.8 % (ref 11.0–15.0)
Total Lymphocyte: 39.5 %
WBC: 6.1 10*3/uL (ref 3.8–10.8)

## 2018-10-17 LAB — HEPATITIS C ANTIBODY
Hepatitis C Ab: NONREACTIVE
SIGNAL TO CUT-OFF: 0.01 (ref <1.00)

## 2018-10-17 LAB — HEMOGLOBIN A1C
Hgb A1c MFr Bld: 8.9 % of total Hgb — ABNORMAL HIGH (ref <5.7)
Mean Plasma Glucose: 209 (calc)
eAG (mmol/L): 11.6 (calc)

## 2018-10-17 LAB — LIPID PANEL
Cholesterol: 116 mg/dL (ref <200)
HDL: 36 mg/dL — ABNORMAL LOW (ref >=50)
LDL Cholesterol (Calc): 56 mg/dL (calc)
Non-HDL Cholesterol (Calc): 80 mg/dL (calc) (ref <130)
Total CHOL/HDL Ratio: 3.2 (calc) (ref <5.0)
Triglycerides: 159 mg/dL — ABNORMAL HIGH (ref <150)

## 2018-10-17 LAB — MICROALBUMIN / CREATININE URINE RATIO
Creatinine, Urine: 57 mg/dL (ref 20–275)
Microalb Creat Ratio: 833 mcg/mg creat — ABNORMAL HIGH (ref <30)
Microalb, Ur: 47.5 mg/dL

## 2018-10-18 ENCOUNTER — Other Ambulatory Visit: Payer: Self-pay | Admitting: *Deleted

## 2018-10-18 MED ORDER — INSULIN ASPART 100 UNIT/ML FLEXPEN: 10.0000 [IU] | PEN_INJECTOR | Freq: Three times a day (TID) | SUBCUTANEOUS | 0 refills | Status: DC

## 2018-11-02 ENCOUNTER — Other Ambulatory Visit: Payer: Self-pay | Admitting: Family Medicine

## 2018-11-28 ENCOUNTER — Other Ambulatory Visit: Payer: Self-pay | Admitting: Family Medicine

## 2018-12-05 ENCOUNTER — Other Ambulatory Visit: Payer: Self-pay | Admitting: Internal Medicine

## 2018-12-05 MED ORDER — ISOSORBIDE MONONITRATE ER 30 MG PO TB24: 30.0000 mg | ORAL_TABLET | Freq: Every day | ORAL | 3 refills | Status: DC

## 2018-12-25 ENCOUNTER — Ambulatory Visit (INDEPENDENT_AMBULATORY_CARE_PROVIDER_SITE_OTHER): Payer: Medicare HMO | Admitting: *Deleted

## 2018-12-25 DIAGNOSIS — R55 Syncope and collapse: Secondary | ICD-10-CM

## 2018-12-26 LAB — CUP PACEART REMOTE DEVICE CHECK
Date Time Interrogation Session: 20200715182538
Implantable Lead Implant Date: 20180709
Implantable Lead Implant Date: 20180709
Implantable Lead Location: 753859
Implantable Lead Location: 753860
Implantable Lead Model: 5076
Implantable Lead Model: 5076
Implantable Pulse Generator Implant Date: 20180709
Pulse Gen Model: 407145
Pulse Gen Serial Number: 69106657

## 2019-01-05 ENCOUNTER — Other Ambulatory Visit: Payer: Self-pay | Admitting: Family Medicine

## 2019-01-08 ENCOUNTER — Telehealth: Payer: Self-pay | Admitting: *Deleted

## 2019-01-08 NOTE — Telephone Encounter (Signed)
The patient verbally consented for a telehealth phone visit with [facility] and understands that his/her insurance company will be billed for the encounter.  No medication changes.    Will have daughter check vitals for her.    [facility]

## 2019-01-09 NOTE — Progress Notes (Signed)
Remote pacemaker transmission.   

## 2019-01-11 ENCOUNTER — Telehealth (INDEPENDENT_AMBULATORY_CARE_PROVIDER_SITE_OTHER): Payer: Medicare HMO | Admitting: Internal Medicine

## 2019-01-11 DIAGNOSIS — Z01812 Encounter for preprocedural laboratory examination: Secondary | ICD-10-CM

## 2019-01-11 DIAGNOSIS — I10 Essential (primary) hypertension: Secondary | ICD-10-CM

## 2019-01-11 DIAGNOSIS — I2510 Atherosclerotic heart disease of native coronary artery without angina pectoris: Secondary | ICD-10-CM

## 2019-01-11 DIAGNOSIS — R079 Chest pain, unspecified: Secondary | ICD-10-CM | POA: Diagnosis not present

## 2019-01-11 DIAGNOSIS — I441 Atrioventricular block, second degree: Secondary | ICD-10-CM

## 2019-01-11 NOTE — Progress Notes (Signed)
Electrophysiology TeleHealth Note   Due to national recommendations of social distancing due to [place] 19, an audio telehealth visit is felt to be most appropriate for this patient at this time.  Verbal consent was obtained by me for the telehealth visit today.  The patient does not have capability for a virtual visit.  A phone visit is therefore required today.   Date:  01/11/2019   ID:  Kristen [name], DOB 15-Feb-1946, MRN [id number]  Location: patient's home  Provider location:  [facility]  Evaluation Performed: Follow-up visit  PCP:  Alycia Rossetti, MD   Electrophysiologist:  Dr Rayann Heman  Chief Complaint:  pacemaker follow up  History of Present Illness:    Kristen [name] is a 73 y.o. female who presents via telehealth conferencing today.  Since last being seen in our clinic, the patient reports doing very well.  Today, she denies symptoms of palpitations, chest pain, shortness of breath,  lower extremity edema, dizziness, presyncope, or syncope.  The patient is otherwise without complaint today.  The patient denies symptoms of fevers, chills, cough, or new SOB worrisome for COVID 19.  Past Medical History:  Diagnosis Date  . Allergy   . Anemia 02/15/2009   Qualifier: Diagnosis of  By: Orville Govern CMA, Arbie Cookey    . Anxiety   . Arthritis   . Bifascicular block   . Blood transfusion without reported diagnosis   . Breast cancer ([place])    breast-left   . CAD (coronary artery disease) 02/15/2009   Qualifier: Diagnosis of  By: Orville Govern CMA, Carol    . Coronary artery disease, non-occlusive    a. Left heart cath 01/2009 showed 40% LAD, 40% D2, 50% distal LAD, 20-30% AV cx, 20-30% luminal Cx, 30% prox RCA, 40-50% mRCA, LVEF normal. b. Intermediate risk nuc 2014 - ? breast attn versus small area of reversible ischemia, EF 74%.  . Depression   . Diabetes mellitus   . Diabetic neuropathy ([place]) 07/24/2013  . DM (diabetes mellitus), secondary uncontrolled ([place]) 02/11/2015  . Essential  hypertension 02/15/2009   Qualifier: Diagnosis of  By: Orville Govern CMA, Arbie Cookey    . GERD (gastroesophageal reflux disease)   . Hemorrhoids   . Hyperkalemia 02/11/2015  . Hyperlipidemia   . Hypertension   . Malignant neoplasm of upper-outer quadrant of left female breast ([place]) 11/30/2012  . Nephrolithiasis   . Obesity   . Overactive bladder   . Personal history of colonic polyps 02/12/2016   09/2010 - 2 diminutive adenomas 02/12/2016 5 mm descending polyp - prolapse type polyp (not precancerous) no recall needed given hx, findings and age   . 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 S/P placement of cardiac pacemaker 01/03/2017  . Stroke [facility])    a. MRI 12/2016 - remote left cerebellar infarcts incidentally noted.  . Syncope 02/11/2015  . Vitamin D deficiency     Past Surgical History:  Procedure Laterality Date  . APPENDECTOMY    . BACK SURGERY    . BREAST SURGERY    . CARDIAC CATHETERIZATION  01/2009   cath by Dr Lia Foyer revealed nonobstructive CAD  . CESAREAN SECTION    . CHOLECYSTECTOMY    . CHOLECYSTECTOMY, LAPAROSCOPIC    . COLONOSCOPY     last 2012- with polyps  . JOINT REPLACEMENT    . LITHOTRIPSY    .  MASTECTOMY W/ SENTINEL NODE BIOPSY Left 12/24/2012   Procedure: LEFT MASTECTOMY WITH LEFT SENTINEL LYMPH NODE BIOPSY;  Surgeon: Rolm Bookbinder, MD;  Location: [facility];  Service: General;  Laterality: Left;  . OOPHORECTOMY Right    1.5  ovaries rem  . PACEMAKER IMPLANT N/A 12/19/2016   Procedure: Pacemaker Implant;  Surgeon: Deboraha Sprang, MD;  Location: [place] CV [facility];  Service: Cardiovascular;  Laterality: N/A;  . PARTIAL HIP ARTHROPLASTY     left hip  . POLYPECTOMY    . REPLACEMENT TOTAL KNEE     both knees  . TONSILLECTOMY    . TOTAL MASTECTOMY Left 12/24/2012   Dr Donne Hazel    Current Outpatient Medications  Medication Sig  Dispense Refill  . amLODipine (NORVASC) 5 MG tablet TAKE 1 TABLET EVERY DAY 90 tablet 3  . aspirin EC 81 MG tablet Take 81 mg by mouth daily.    Marland Kitchen atorvastatin (LIPITOR) 40 MG tablet Take 1 tablet (40 mg total) by mouth at bedtime. 90 tablet 3  . blood glucose meter kit and supplies KIT Dispense based on patient and insurance preference. Use to monitor FSBS 3x daily. Dx: E11.41. 1 each 0  . Blood Glucose Monitoring Suppl (BLOOD GLUCOSE SYSTEM PAK) KIT Dispense based on patient and insurance preference. (Accu-Chek Aviva Plus) Use to monitor FSBS 3x daily. Dx: E11.41. 1 each 1  . ezetimibe (ZETIA) 10 MG tablet TAKE 1 TABLET (10 MG TOTAL) BY MOUTH DAILY. 90 tablet 2  . FLUoxetine (PROZAC) 40 MG capsule Take 1 capsule (40 mg total) by mouth daily. 90 capsule 2  . fluticasone (FLONASE) 50 MCG/ACT nasal spray Place 2 sprays into both nostrils daily. 48 g 3  . gabapentin (NEURONTIN) 300 MG capsule TAKE ONE CAPSULE BY MOUTH 2 TIMES A DAY 180 capsule 3  . glucose blood test strip Dispense based on patient and insurance preference. (Accu-Chek Aviva Plus) Use to monitor FSBS 3x daily. Dx: E11.41. 300 each 3  . Insulin Pen Needle (UNIFINE PENTIPS) 32G X 4 MM MISC USE TO INJECT TRESIBA AND NOVOLOG EVERY DAY AS DIRECTED 100 each 0  . isosorbide mononitrate (IMDUR) 30 MG 24 hr tablet Take 1 tablet (30 mg total) by mouth daily. 90 tablet 3  . Lancets MISC Dispense based on patient and insurance preference. Use to monitor FSBS 3x daily. Dx: E11.41. 300 each 3  . metFORMIN (GLUCOPHAGE) 1000 MG tablet Take 1 tablet (1,000 mg total) by mouth 2 (two) times daily with a meal. 360 tablet 0  . NOVOLOG FLEXPEN 100 UNIT/ML FlexPen INJECT 8 UNITS SUBCUTANEOUSLY THREE TIMES DAILY WITH MEALS (NEED MD APPOINTMENT FOR REFILLS) 30 mL 0  . omeprazole (PRILOSEC) 20 MG capsule TAKE 1 CAPSULE BY MOUTH 2 TIMES DAILY. (Patient taking differently: Take 20 mg by mouth 2 (two) times daily before a meal. ) 180 capsule 2  . oxybutynin  (DITROPAN-XL) 5 MG 24 hr tablet Take 1 tablet (5 mg total) by mouth at bedtime. 90 tablet 3  . TRESIBA FLEXTOUCH 100 UNIT/ML SOPN FlexTouch Pen INJECT 60 UNITS SUBCUTANEOUSLY EVERY DAY (NEED MD APPOINTMENT FOR REFILLS) 60 mL 0   No current facility-administered medications for this visit.     Allergies:   Sulfonamide derivatives   Social History:  The patient  reports that she has never smoked. She has never used smokeless tobacco. She reports that she does not drink alcohol or use drugs.   Family History:  The patient's family history includes Brain cancer in her brother;  Breast cancer in her mother and sister; Cancer in her mother; Heart attack (age of onset: 34) in her father; Lung cancer in her brother and brother; Lung cancer (age of onset: 30) in her brother.   ROS:  Please see the history of present illness.   All other systems are personally reviewed and negative.    Exam:    Vital Signs:  There were no vitals taken for this visit.  Well sounding    Labs/Other Tests and Data Reviewed:    Recent Labs: 10/16/2018: ALT 21; BUN 16; Creat 0.85; Hemoglobin 13.3; Platelets 272; Potassium 5.3; Sodium 142   Wt Readings from Last 3 Encounters:  10/16/18 199 lb (90.3 kg)  03/27/18 196 lb 8 oz (89.1 kg)  02/16/18 198 lb (89.8 kg)     Last device remote is reviewed from [place] [place] PDF which reveals normal device function, no arrhythmias    ASSESSMENT & PLAN:    1.  Second degree AV block Normal pacemaker function by recent remote See [facility]ace[facility] report  2.  Chest pain She has some ongoing chest pain with both atypical and typical features Continue ASA and statin Will pursue cardiac CT scan   3.  HTN Stable No change required today     Follow-up:  With remote monitoring, me in 1 year    Patient Risk:  after full review of this patients clinical status, I feel that they are at moderate risk at this time.  Today, I have spent 15 minutes with the patient with telehealth  technology discussing arrhythmia management .    Army Fossa, MD  01/11/2019 8:59 AM     [facility] [address] [place] [address] [phone] (office) [phone] (fax)

## 2019-01-15 MED ORDER — METOPROLOL TARTRATE 100 MG PO TABS: 100.0000 mg | ORAL_TABLET | Freq: Once | ORAL | 0 refills | Status: DC

## 2019-01-15 NOTE — Patient Instructions (Addendum)
Medication Instructions:  Continue all current medications.  Labwork:  BMET - order enclosed.  Please do just prior to Coronary CT.  Testing/Procedures:  Coronary CT - done at [facility] will contact with results via phone or letter.    Follow-Up: Your physician wants you to follow up in:  1 year.  You will receive a reminder letter in the mail one-two months in advance.  If you don't receive a letter, please call our office to schedule the follow up appointment - Dr. Rayann Heman.    Any Other Special Instructions Will Be Listed Below (If Applicable).  If you need a refill on your cardiac medications before your next appointment, please call your pharmacy.   --------------------------------------------------------------------------------------------------------------------------------------------------------   Your cardiac CT will be scheduled at one of the below locations:   [facility] [address] [place], [place] [address] [phone]  [place] [address] [place], [place] [address] [phone]  Please arrive at the [facility] main entrance of [facility] [place] [facility] 30-45 minutes prior to test start time. Proceed to the [facility] Radiology Department (first floor) to check-in and test prep.  Please follow these instructions carefully (unless otherwise directed):  Hold all erectile dysfunction medications at least 48 hours prior to test.  On the Night Before the Test: . Be sure to Drink plenty of water. . Do not consume any caffeinated/decaffeinated beverages or chocolate 12 hours prior to your test. . Do not take any antihistamines 12 hours prior to your test. . If you take Metformin do not take 24 hours prior to test. (due to contrast) . If the patient has contrast allergy: ? Patient will need a prescription for Prednisone and very clear instructions (as follows): 1. Prednisone 50 mg  - take 13 hours prior to test 2. Take another Prednisone 50 mg 7 hours prior to test 3. Take another Prednisone 50 mg 1 hour prior to test 4. Take Benadryl 50 mg 1 hour prior to test . Patient must complete all four doses of above prophylactic medications. . Patient will need a ride after test due to Benadryl.  On the Day of the Test: . Drink plenty of water. Do not drink any water within one hour of the test. . Do not eat any food 4 hours prior to the test. - please hold your diabetes medications due to not eating . You may take your regular medications prior to the test.  . Take metoprolol (Lopressor) two hours prior to test.  (Lopressor 100mg  x 1 dose) - sent to [facility] today.  Marland Kitchen HOLD Furosemide/Hydrochlorothiazide morning of the test. . FEMALES- please wear underwire-free bra if available    After the Test: . Drink plenty of water. . After receiving IV contrast, you may experience a mild flushed feeling. This is normal. . On occasion, you may experience a mild rash up to 24 hours after the test. This is not dangerous. If this occurs, you can take Benadryl 25 mg and increase your fluid intake. . If you experience trouble breathing, this can be serious. If it is severe call [phone] IMMEDIATELY. If it is mild, please call our office. . If you take any of these medications: Glipizide/Metformin, Avandament, Glucavance, please do not take 48 hours after completing test.   Please contact the cardiac imaging nurse navigator should you have any questions/concerns Marchia Bond, RN Navigator Cardiac [place] [facility] [phone] Office  [phone] Cell

## 2019-01-15 NOTE — Addendum Note (Signed)
Addended by: Laurine Blazer on: 01/15/2019 05:50 PM   Modules accepted: Orders

## 2019-01-16 ENCOUNTER — Other Ambulatory Visit: Payer: Self-pay | Admitting: Family Medicine

## 2019-01-18 ENCOUNTER — Other Ambulatory Visit: Payer: Self-pay | Admitting: Family Medicine

## 2019-01-25 ENCOUNTER — Other Ambulatory Visit (HOSPITAL_COMMUNITY)
Admission: RE | Admit: 2019-01-25 | Discharge: 2019-01-25 | Disposition: A | Discharge status: Home / Self Care | Payer: Medicare HMO | Source: Ambulatory Visit | Attending: Internal Medicine | Admitting: Internal Medicine

## 2019-01-25 ENCOUNTER — Other Ambulatory Visit: Payer: Self-pay

## 2019-01-25 DIAGNOSIS — Z01812 Encounter for preprocedural laboratory examination: Secondary | ICD-10-CM | POA: Diagnosis not present

## 2019-01-25 DIAGNOSIS — R079 Chest pain, unspecified: Secondary | ICD-10-CM | POA: Diagnosis not present

## 2019-01-25 LAB — BASIC METABOLIC PANEL
Anion gap: 9 (ref 5–15)
BUN: 19 mg/dL (ref 8–23)
CO2: 27 mmol/L (ref 22–32)
Calcium: 9.3 mg/dL (ref 8.9–10.3)
Chloride: 103 mmol/L (ref 98–111)
Creatinine, Ser: 0.83 mg/dL (ref 0.44–1.00)
GFR calc Af Amer: >60 mL/min (ref >60)
GFR calc non Af Amer: >60 mL/min (ref >60)
Glucose, Bld: 172 mg/dL — ABNORMAL HIGH (ref 70–99)
Potassium: 4.7 mmol/L (ref 3.5–5.1)
Sodium: 139 mmol/L (ref 135–145)

## 2019-02-14 ENCOUNTER — Telehealth (HOSPITAL_COMMUNITY): Payer: Self-pay | Admitting: Emergency Medicine

## 2019-02-14 NOTE — Telephone Encounter (Signed)
Reaching out to patient to offer assistance regarding upcoming cardiac imaging study; pt verbalizes understanding of appt date/time, parking situation and where to check in, pre-test NPO status and medications ordered, and verified current allergies; name and call back number provided for further questions should they arise Quashawn Jewkes RN Navigator Cardiac [facility] [phone] office [phone] cell 

## 2019-02-15 ENCOUNTER — Other Ambulatory Visit: Payer: Self-pay

## 2019-02-15 ENCOUNTER — Ambulatory Visit (HOSPITAL_COMMUNITY)
Admission: RE | Admit: 2019-02-15 | Discharge: 2019-02-15 | Disposition: A | Discharge status: Home / Self Care | Payer: Medicare HMO | Source: Ambulatory Visit | Attending: Internal Medicine | Admitting: Internal Medicine

## 2019-02-15 DIAGNOSIS — R079 Chest pain, unspecified: Secondary | ICD-10-CM | POA: Insufficient documentation

## 2019-02-15 DIAGNOSIS — I2510 Atherosclerotic heart disease of native coronary artery without angina pectoris: Secondary | ICD-10-CM | POA: Insufficient documentation

## 2019-02-15 MED ORDER — NITROGLYCERIN 0.4 MG SL SUBL
SUBLINGUAL_TABLET | SUBLINGUAL | Status: AC
  Filled 2019-02-15: qty 2

## 2019-02-15 MED ORDER — IOHEXOL 350 MG/ML SOLN
80.0000 mL | Freq: Once | INTRAVENOUS | Status: AC | PRN
  Administered 2019-02-15: 14:00:00 100 mL via INTRAVENOUS

## 2019-02-15 MED ORDER — NITROGLYCERIN 0.4 MG SL SUBL
0.8000 mg | SUBLINGUAL_TABLET | Freq: Once | SUBLINGUAL | Status: AC
  Administered 2019-02-15: 0.8 mg via SUBLINGUAL
  Filled 2019-02-15: qty 25

## 2019-02-18 DIAGNOSIS — I2510 Atherosclerotic heart disease of native coronary artery without angina pectoris: Secondary | ICD-10-CM | POA: Diagnosis not present

## 2019-02-19 ENCOUNTER — Telehealth: Payer: Self-pay

## 2019-02-19 NOTE — Telephone Encounter (Signed)
Spoke with pt regarding appt on 02/20/19. Pt stated she will have her daughter check her vitals prior to her appt and did not have any questions at this time.

## 2019-02-20 ENCOUNTER — Telehealth: Payer: Self-pay

## 2019-02-20 ENCOUNTER — Other Ambulatory Visit: Payer: Self-pay

## 2019-02-20 ENCOUNTER — Encounter: Payer: Self-pay | Admitting: Internal Medicine

## 2019-02-20 ENCOUNTER — Telehealth (INDEPENDENT_AMBULATORY_CARE_PROVIDER_SITE_OTHER): Payer: Medicare HMO | Admitting: Internal Medicine

## 2019-02-20 VITALS — BP 130/70 | Ht 62.0 in | Wt 196.0 lb

## 2019-02-20 DIAGNOSIS — I441 Atrioventricular block, second degree: Secondary | ICD-10-CM | POA: Diagnosis not present

## 2019-02-20 DIAGNOSIS — I10 Essential (primary) hypertension: Secondary | ICD-10-CM | POA: Diagnosis not present

## 2019-02-20 DIAGNOSIS — R079 Chest pain, unspecified: Secondary | ICD-10-CM

## 2019-02-20 NOTE — Progress Notes (Signed)
Electrophysiology TeleHealth Note   Due to national recommendations of social distancing due to [place] 19, an audio telehealth visit is felt to be most appropriate for this patient at this time.  Verbal consent was obtained by me for the telehealth visit today.  The patient does not have capability for a virtual visit.  A phone visit is therefore required today.   Date:  02/20/2019   ID:  Kristen [name], DOB [birth year], MRN [id number]  Location: patient's home  Provider location:  [facility]  Evaluation Performed: Follow-up visit  PCP:  Alycia Rossetti, MD   Electrophysiologist:  Dr Rayann Heman  Chief Complaint:  CT scan follow up  History of Present Illness:    Kristen [name] is a 73 y.o. female who presents via telehealth conferencing today.  Since last being seen in our clinic, the patient reports doing reasonably well.  Ct scan results are reviewed which show severe stenosis in the mid LAD with cardiac catheterization recommended.  Today, she denies symptoms of palpitations, chest pain, shortness of breath,  lower extremity edema, dizziness, presyncope, or syncope.  The patient is otherwise without complaint today.  The patient denies symptoms of fevers, chills, cough, or new SOB worrisome for COVID 19.  Past Medical History:  Diagnosis Date  . Allergy   . Anemia 02/15/2009   Qualifier: Diagnosis of  By: Orville Govern CMA, Arbie Cookey    . Anxiety   . Arthritis   . Bifascicular block   . Blood transfusion without reported diagnosis   . Breast cancer ([place])    breast-left   . CAD (coronary artery disease) 02/15/2009   Qualifier: Diagnosis of  By: Orville Govern CMA, Carol    . Coronary artery disease, non-occlusive    a. Left heart cath 01/2009 showed 40% LAD, 40% D2, 50% distal LAD, 20-30% AV cx, 20-30% luminal Cx, 30% prox RCA, 40-50% mRCA, LVEF normal. b. Intermediate risk nuc 2014 - ? breast attn versus small area of reversible ischemia, EF 74%.  . Depression   . Diabetes mellitus   . Diabetic  neuropathy ([place]) 07/24/2013  . DM (diabetes mellitus), secondary uncontrolled ([place]) 02/11/2015  . Essential hypertension 02/15/2009   Qualifier: Diagnosis of  By: Orville Govern CMA, Arbie Cookey    . GERD (gastroesophageal reflux disease)   . Hemorrhoids   . Hyperkalemia 02/11/2015  . Hyperlipidemia   . Hypertension   . Malignant neoplasm of upper-outer quadrant of left female breast ([place]) 11/30/2012  . Nephrolithiasis   . Obesity   . Overactive bladder   . Personal history of colonic polyps 02/12/2016   09/2010 - 2 diminutive adenomas 02/12/2016 5 mm descending polyp - prolapse type polyp (not precancerous) no recall needed given hx, findings and age   . 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 S/P placement of cardiac pacemaker 01/03/2017  . Stroke [facility])    a. MRI 12/2016 - remote left cerebellar infarcts incidentally noted.  . Syncope 02/11/2015  . Vitamin D deficiency     Past Surgical History:  Procedure Laterality Date  . APPENDECTOMY    . BACK SURGERY    . BREAST SURGERY    . CARDIAC CATHETERIZATION  01/2009   cath by Dr Lia Foyer revealed nonobstructive CAD  . CESAREAN SECTION    . CHOLECYSTECTOMY    . CHOLECYSTECTOMY, LAPAROSCOPIC    . COLONOSCOPY  last 2012- with polyps  . JOINT REPLACEMENT    . LITHOTRIPSY    . MASTECTOMY W/ SENTINEL NODE BIOPSY Left 12/24/2012   Procedure: LEFT MASTECTOMY WITH LEFT SENTINEL LYMPH NODE BIOPSY;  Surgeon: Rolm Bookbinder, MD;  Location: [facility];  Service: General;  Laterality: Left;  . OOPHORECTOMY Right    1.5  ovaries rem  . PACEMAKER IMPLANT N/A 12/19/2016   Procedure: Pacemaker Implant;  Surgeon: Deboraha Sprang, MD;  Location: [facility];  Service: Cardiovascular;  Laterality: N/A;  . PARTIAL HIP ARTHROPLASTY     left hip  . POLYPECTOMY    . REPLACEMENT TOTAL KNEE     both knees  . TONSILLECTOMY    .  TOTAL MASTECTOMY Left 12/24/2012   Dr Donne Hazel    Current Outpatient Medications  Medication Sig Dispense Refill  . amLODipine (NORVASC) 5 MG tablet TAKE 1 TABLET EVERY DAY 90 tablet 3  . aspirin EC 81 MG tablet Take 81 mg by mouth daily.    Marland Kitchen atorvastatin (LIPITOR) 40 MG tablet Take 1 tablet (40 mg total) by mouth at bedtime. 90 tablet 3  . blood glucose meter kit and supplies KIT Dispense based on patient and insurance preference. Use to monitor FSBS 3x daily. Dx: E11.41. 1 each 0  . Blood Glucose Monitoring Suppl (BLOOD GLUCOSE SYSTEM PAK) KIT Dispense based on patient and insurance preference. (Accu-Chek Aviva Plus) Use to monitor FSBS 3x daily. Dx: E11.41. 1 each 1  . ezetimibe (ZETIA) 10 MG tablet TAKE 1 TABLET (10 MG TOTAL) BY MOUTH DAILY. 90 tablet 2  . FLUoxetine (PROZAC) 40 MG capsule Take 1 capsule (40 mg total) by mouth daily. 90 capsule 2  . fluticasone (FLONASE) 50 MCG/ACT nasal spray Place 2 sprays into both nostrils daily. 48 g 3  . gabapentin (NEURONTIN) 300 MG capsule TAKE ONE CAPSULE BY MOUTH 2 TIMES A DAY 180 capsule 3  . glucose blood test strip Dispense based on patient and insurance preference. (Accu-Chek Aviva Plus) Use to monitor FSBS 3x daily. Dx: E11.41. 300 each 3  . Insulin Pen Needle (UNIFINE PENTIPS) 32G X 4 MM MISC USE TO INJECT TRESIBA AND NOVOLOG EVERY DAY AS DIRECTED 100 each 0  . isosorbide mononitrate (IMDUR) 30 MG 24 hr tablet Take 1 tablet (30 mg total) by mouth daily. 90 tablet 3  . Lancets MISC Dispense based on patient and insurance preference. Use to monitor FSBS 3x daily. Dx: E11.41. 300 each 3  . metFORMIN (GLUCOPHAGE) 1000 MG tablet TAKE 1 TABLET TWICE DAILY WITH MEALS 180 tablet 3  . NOVOLOG FLEXPEN 100 UNIT/ML FlexPen INJECT 8 UNITS SUBCUTANEOUSLY THREE TIMES DAILY WITH MEALS (NEED MD APPOINTMENT FOR REFILLS) 30 mL 0  . omeprazole (PRILOSEC) 20 MG capsule TAKE 1 CAPSULE BY MOUTH 2 TIMES DAILY. (Patient taking differently: Take 20 mg by mouth 2  (two) times daily before a meal. ) 180 capsule 2  . oxybutynin (DITROPAN-XL) 5 MG 24 hr tablet Take 1 tablet (5 mg total) by mouth at bedtime. 90 tablet 3  . TRESIBA FLEXTOUCH 100 UNIT/ML SOPN FlexTouch Pen INJECT 60 UNITS SUBCUTANEOUSLY EVERY DAY (NEED MD APPOINTMENT FOR REFILLS) 60 mL 0  . metoprolol tartrate (LOPRESSOR) 100 MG tablet Take 1 tablet (100 mg total) by mouth once for 1 dose. 1 tablet 0   No current facility-administered medications for this visit.     Allergies:   Sulfonamide derivatives   Social History:  The patient  reports that she has never smoked.  She has never used smokeless tobacco. She reports that she does not drink alcohol or use drugs.   Family History:  The patient's  family history includes Brain cancer in her brother; Breast cancer in her mother and sister; Cancer in her mother; Heart attack (age of onset: 81) in her father; Lung cancer in her brother and brother; Lung cancer (age of onset: 21) in her brother.   ROS:  Please see the history of present illness.   All other systems are personally reviewed and negative.    Exam:    Vital Signs:  BP 130/70   Ht _0  (1.575 m)   Wt 196 lb (88.9 kg)   BMI 35.85 kg/m   Well sounding and appearing, alert and conversant, regular work of breathing   Labs/Other Tests and Data Reviewed:    Recent Labs: 10/16/2018: ALT 21; Hemoglobin 13.3; Platelets 272 01/25/2019: BUN 19; Creatinine, Ser 0.83; Potassium 4.7; Sodium 139   Wt Readings from Last 3 Encounters:  02/20/19 196 lb (88.9 kg)  10/16/18 199 lb (90.3 kg)  03/27/18 196 lb 8 oz (89.1 kg)       ASSESSMENT & PLAN:    1.  Chest pain Ct scan results as above.  Results discussed with patient. I would therefore recommend left heart catheterization with possible PCI.  Discussed the cath with the patient. The patient understands that risks included but are not limited to stroke (1 in 1000), death (1 in 38), kidney failure [usually temporary] (1 in 500),  bleeding (1 in 200), allergic reaction [possibly serious] (1 in 200). The patient understands and agrees to proceed.   2.  Second degree AV block Normal pacemaker function per last remote See PaceArt report  3.  HTN Stable No change required today   Follow-up:  After heart cath    Patient Risk:  after full review of this patients clinical status, I feel that they are at moderate risk at this time.  Today, I have spent 15 minutes with the patient with telehealth technology discussing arrhythmia management .    Army Fossa, MD  02/20/2019 1:30 PM     [place] [address] [phone] (office) [phone] (fax)

## 2019-02-20 NOTE — Telephone Encounter (Signed)
Call placed to Pt/daughter.  Per Dr. Doreatha Massed Pt for left heart cath for chest pain and abnormal cardiac CT.  Pt scheduled for February 26, 2019 at 7:30 am with Dr. Irish Lack.  Pt will get labs/covid test at [facility].  Advised Pt to go to [place] office on February 22, 2019 and pick up instruction letter and lab orders prior to going to [facility] for labs/covid test.  All work up complete.  Will forward to [facility] office so that labs and instruction letter can be printed for Pt pickup.

## 2019-02-20 NOTE — H&P (View-Only) (Signed)
Electrophysiology TeleHealth Note   Due to national recommendations of social distancing due to [place] 19, an audio telehealth visit is felt to be most appropriate for this patient at this time.  Verbal consent was obtained by me for the telehealth visit today.  The patient does not have capability for a virtual visit.  A phone visit is therefore required today.   Date:  02/20/2019   ID:  Kristen [name], DOB [birth year], MRN [id number]  Location: patient's home  Provider location:  [facility]  Evaluation Performed: Follow-up visit  PCP:  Alycia Rossetti, MD   Electrophysiologist:  Dr Rayann Heman  Chief Complaint:  CT scan follow up  History of Present Illness:    Kristen [name] is a 73 y.o. female who presents via telehealth conferencing today.  Since last being seen in our clinic, the patient reports doing reasonably well.  Ct scan results are reviewed which show severe stenosis in the mid LAD with cardiac catheterization recommended.  Today, she denies symptoms of palpitations, chest pain, shortness of breath,  lower extremity edema, dizziness, presyncope, or syncope.  The patient is otherwise without complaint today.  The patient denies symptoms of fevers, chills, cough, or new SOB worrisome for COVID 19.  Past Medical History:  Diagnosis Date  . Allergy   . Anemia 02/15/2009   Qualifier: Diagnosis of  By: Orville Govern CMA, Arbie Cookey    . Anxiety   . Arthritis   . Bifascicular block   . Blood transfusion without reported diagnosis   . Breast cancer ([place])    breast-left   . CAD (coronary artery disease) 02/15/2009   Qualifier: Diagnosis of  By: Orville Govern CMA, Carol    . Coronary artery disease, non-occlusive    a. Left heart cath 01/2009 showed 40% LAD, 40% D2, 50% distal LAD, 20-30% AV cx, 20-30% luminal Cx, 30% prox RCA, 40-50% mRCA, LVEF normal. b. Intermediate risk nuc 2014 - ? breast attn versus small area of reversible ischemia, EF 74%.  . Depression   . Diabetes mellitus   . Diabetic  neuropathy ([place]) 07/24/2013  . DM (diabetes mellitus), secondary uncontrolled ([place]) 02/11/2015  . Essential hypertension 02/15/2009   Qualifier: Diagnosis of  By: Orville Govern CMA, Arbie Cookey    . GERD (gastroesophageal reflux disease)   . Hemorrhoids   . Hyperkalemia 02/11/2015  . Hyperlipidemia   . Hypertension   . Malignant neoplasm of upper-outer quadrant of left female breast ([place]) 11/30/2012  . Nephrolithiasis   . Obesity   . Overactive bladder   . Personal history of colonic polyps 02/12/2016   09/2010 - 2 diminutive adenomas 02/12/2016 5 mm descending polyp - prolapse type polyp (not precancerous) no recall needed given hx, findings and age   . 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 S/P placement of cardiac pacemaker 01/03/2017  . Stroke [facility])    a. MRI 12/2016 - remote left cerebellar infarcts incidentally noted.  . Syncope 02/11/2015  . Vitamin D deficiency     Past Surgical History:  Procedure Laterality Date  . APPENDECTOMY    . BACK SURGERY    . BREAST SURGERY    . CARDIAC CATHETERIZATION  01/2009   cath by Dr Lia Foyer revealed nonobstructive CAD  . CESAREAN SECTION    . CHOLECYSTECTOMY    . CHOLECYSTECTOMY, LAPAROSCOPIC    . COLONOSCOPY  last 2012- with polyps  . JOINT REPLACEMENT    . LITHOTRIPSY    . MASTECTOMY W/ SENTINEL NODE BIOPSY Left 12/24/2012   Procedure: LEFT MASTECTOMY WITH LEFT SENTINEL LYMPH NODE BIOPSY;  Surgeon: Rolm Bookbinder, MD;  Location: [facility];  Service: General;  Laterality: Left;  . OOPHORECTOMY Right    1.5  ovaries rem  . PACEMAKER IMPLANT N/A 12/19/2016   Procedure: Pacemaker Implant;  Surgeon: Deboraha Sprang, MD;  Location: [facility];  Service: Cardiovascular;  Laterality: N/A;  . PARTIAL HIP ARTHROPLASTY     left hip  . POLYPECTOMY    . REPLACEMENT TOTAL KNEE     both knees  . TONSILLECTOMY    .  TOTAL MASTECTOMY Left 12/24/2012   Dr Donne Hazel    Current Outpatient Medications  Medication Sig Dispense Refill  . amLODipine (NORVASC) 5 MG tablet TAKE 1 TABLET EVERY DAY 90 tablet 3  . aspirin EC 81 MG tablet Take 81 mg by mouth daily.    Marland Kitchen atorvastatin (LIPITOR) 40 MG tablet Take 1 tablet (40 mg total) by mouth at bedtime. 90 tablet 3  . blood glucose meter kit and supplies KIT Dispense based on patient and insurance preference. Use to monitor FSBS 3x daily. Dx: E11.41. 1 each 0  . Blood Glucose Monitoring Suppl (BLOOD GLUCOSE SYSTEM PAK) KIT Dispense based on patient and insurance preference. (Accu-Chek Aviva Plus) Use to monitor FSBS 3x daily. Dx: E11.41. 1 each 1  . ezetimibe (ZETIA) 10 MG tablet TAKE 1 TABLET (10 MG TOTAL) BY MOUTH DAILY. 90 tablet 2  . FLUoxetine (PROZAC) 40 MG capsule Take 1 capsule (40 mg total) by mouth daily. 90 capsule 2  . fluticasone (FLONASE) 50 MCG/ACT nasal spray Place 2 sprays into both nostrils daily. 48 g 3  . gabapentin (NEURONTIN) 300 MG capsule TAKE ONE CAPSULE BY MOUTH 2 TIMES A DAY 180 capsule 3  . glucose blood test strip Dispense based on patient and insurance preference. (Accu-Chek Aviva Plus) Use to monitor FSBS 3x daily. Dx: E11.41. 300 each 3  . Insulin Pen Needle (UNIFINE PENTIPS) 32G X 4 MM MISC USE TO INJECT TRESIBA AND NOVOLOG EVERY DAY AS DIRECTED 100 each 0  . isosorbide mononitrate (IMDUR) 30 MG 24 hr tablet Take 1 tablet (30 mg total) by mouth daily. 90 tablet 3  . Lancets MISC Dispense based on patient and insurance preference. Use to monitor FSBS 3x daily. Dx: E11.41. 300 each 3  . metFORMIN (GLUCOPHAGE) 1000 MG tablet TAKE 1 TABLET TWICE DAILY WITH MEALS 180 tablet 3  . NOVOLOG FLEXPEN 100 UNIT/ML FlexPen INJECT 8 UNITS SUBCUTANEOUSLY THREE TIMES DAILY WITH MEALS (NEED MD APPOINTMENT FOR REFILLS) 30 mL 0  . omeprazole (PRILOSEC) 20 MG capsule TAKE 1 CAPSULE BY MOUTH 2 TIMES DAILY. (Patient taking differently: Take 20 mg by mouth 2  (two) times daily before a meal. ) 180 capsule 2  . oxybutynin (DITROPAN-XL) 5 MG 24 hr tablet Take 1 tablet (5 mg total) by mouth at bedtime. 90 tablet 3  . TRESIBA FLEXTOUCH 100 UNIT/ML SOPN FlexTouch Pen INJECT 60 UNITS SUBCUTANEOUSLY EVERY DAY (NEED MD APPOINTMENT FOR REFILLS) 60 mL 0  . metoprolol tartrate (LOPRESSOR) 100 MG tablet Take 1 tablet (100 mg total) by mouth once for 1 dose. 1 tablet 0   No current facility-administered medications for this visit.     Allergies:   Sulfonamide derivatives   Social History:  The patient  reports that she has never smoked.  She has never used smokeless tobacco. She reports that she does not drink alcohol or use drugs.   Family History:  The patient's  family history includes Brain cancer in her brother; Breast cancer in her mother and sister; Cancer in her mother; Heart attack (age of onset: 81) in her father; Lung cancer in her brother and brother; Lung cancer (age of onset: 21) in her brother.   ROS:  Please see the history of present illness.   All other systems are personally reviewed and negative.    Exam:    Vital Signs:  BP 130/70   Ht _0  (1.575 m)   Wt 196 lb (88.9 kg)   BMI 35.85 kg/m   Well sounding and appearing, alert and conversant, regular work of breathing   Labs/Other Tests and Data Reviewed:    Recent Labs: 10/16/2018: ALT 21; Hemoglobin 13.3; Platelets 272 01/25/2019: BUN 19; Creatinine, Ser 0.83; Potassium 4.7; Sodium 139   Wt Readings from Last 3 Encounters:  02/20/19 196 lb (88.9 kg)  10/16/18 199 lb (90.3 kg)  03/27/18 196 lb 8 oz (89.1 kg)       ASSESSMENT & PLAN:    1.  Chest pain Ct scan results as above.  Results discussed with patient. I would therefore recommend left heart catheterization with possible PCI.  Discussed the cath with the patient. The patient understands that risks included but are not limited to stroke (1 in 1000), death (1 in 38), kidney failure [usually temporary] (1 in 500),  bleeding (1 in 200), allergic reaction [possibly serious] (1 in 200). The patient understands and agrees to proceed.   2.  Second degree AV block Normal pacemaker function per last remote See PaceArt report  3.  HTN Stable No change required today   Follow-up:  After heart cath    Patient Risk:  after full review of this patients clinical status, I feel that they are at moderate risk at this time.  Today, I have spent 15 minutes with the patient with telehealth technology discussing arrhythmia management .    Army Fossa, MD  02/20/2019 1:30 PM     [place] [address] [phone] (office) [phone] (fax)

## 2019-02-20 NOTE — Telephone Encounter (Signed)
Done.  Patient made aware to call office when she arrives to parking lot & nurse will bring out to her.  She verbalized understanding.

## 2019-02-22 ENCOUNTER — Other Ambulatory Visit: Payer: Self-pay

## 2019-02-22 ENCOUNTER — Telehealth: Payer: Self-pay | Admitting: Internal Medicine

## 2019-02-22 ENCOUNTER — Other Ambulatory Visit (HOSPITAL_COMMUNITY)
Admission: RE | Admit: 2019-02-22 | Discharge: 2019-02-22 | Disposition: A | Discharge status: Home / Self Care | Payer: Medicare HMO | Source: Ambulatory Visit | Attending: Interventional Cardiology | Admitting: Interventional Cardiology

## 2019-02-22 ENCOUNTER — Other Ambulatory Visit (HOSPITAL_COMMUNITY)
Admission: RE | Admit: 2019-02-22 | Discharge: 2019-02-22 | Disposition: A | Discharge status: Home / Self Care | Payer: Medicare HMO | Source: Ambulatory Visit | Attending: Internal Medicine | Admitting: Internal Medicine

## 2019-02-22 DIAGNOSIS — Z01812 Encounter for preprocedural laboratory examination: Secondary | ICD-10-CM | POA: Diagnosis not present

## 2019-02-22 DIAGNOSIS — Z20828 Contact with and (suspected) exposure to other viral communicable diseases: Secondary | ICD-10-CM | POA: Insufficient documentation

## 2019-02-22 LAB — CBC WITH DIFFERENTIAL/PLATELET
Abs Immature Granulocytes: 0.03 10*3/uL (ref 0.00–0.07)
Basophils Absolute: 0 10*3/uL (ref 0.0–0.1)
Basophils Relative: 0 %
Eosinophils Absolute: 0.2 10*3/uL (ref 0.0–0.5)
Eosinophils Relative: 3 %
HCT: 41.3 % (ref 36.0–46.0)
Hemoglobin: 12.8 g/dL (ref 12.0–15.0)
Immature Granulocytes: 0 %
Lymphocytes Relative: 19 %
Lymphs Abs: 1.3 10*3/uL (ref 0.7–4.0)
MCH: 30.3 pg (ref 26.0–34.0)
MCHC: 31 g/dL (ref 30.0–36.0)
MCV: 97.9 fL (ref 80.0–100.0)
Monocytes Absolute: 0.8 10*3/uL (ref 0.1–1.0)
Monocytes Relative: 11 %
Neutro Abs: 4.7 10*3/uL (ref 1.7–7.7)
Neutrophils Relative %: 67 %
Platelets: 221 10*3/uL (ref 150–400)
RBC: 4.22 MIL/uL (ref 3.87–5.11)
RDW: 14.6 % (ref 11.5–15.5)
WBC: 7 10*3/uL (ref 4.0–10.5)
nRBC: 0 % (ref 0.0–0.2)

## 2019-02-22 LAB — BASIC METABOLIC PANEL
Anion gap: 9 (ref 5–15)
BUN: 19 mg/dL (ref 8–23)
CO2: 26 mmol/L (ref 22–32)
Calcium: 9.2 mg/dL (ref 8.9–10.3)
Chloride: 101 mmol/L (ref 98–111)
Creatinine, Ser: 0.92 mg/dL (ref 0.44–1.00)
GFR calc Af Amer: >60 mL/min (ref >60)
GFR calc non Af Amer: >60 mL/min (ref >60)
Glucose, Bld: 211 mg/dL — ABNORMAL HIGH (ref 70–99)
Potassium: 4.6 mmol/L (ref 3.5–5.1)
Sodium: 136 mmol/L (ref 135–145)

## 2019-02-22 LAB — SARS CORONAVIRUS 2 (TAT 6-24 HRS): SARS Coronavirus 2: NEGATIVE

## 2019-02-22 NOTE — Telephone Encounter (Signed)
Pre-cert Verification for the following procedure    catheterization scheduled for 9-15--2020 at [facility] .

## 2019-02-25 ENCOUNTER — Telehealth: Payer: Self-pay | Admitting: *Deleted

## 2019-02-25 NOTE — Telephone Encounter (Signed)
Pt contacted pre-catheterization scheduled at [facility] for a left heart catheterization on 02/26/2019 with Dr. Irish Lack.  Verified arrival time and place: [facility]) at 5:30 am.    No solid food after midnight prior to cath, clear liquids until 5 AM day of procedure. Contrast allergy: None  Patient is taking diabetes medications including metformin, tresiba, and novolog. Patient advised to hold metformin, tresiba, and novolog tomorrow morning. Patient will wait 48 hours before resuming metformin. Informed patient she can resume tresiba and novolog normally after her procedure. Patient will only take half of her tresiba and novolog doses the night before her catheterization.   AM meds can be  taken pre-cath with sip of water including: ASA 81 mg   Confirmed patient has responsible person to drive home post procedure and observe 24 hours after arriving home: yes  Currently, due to Covid-19 pandemic, only one support person will be allowed with patient. Must be the same support person for that patient's entire stay, will be screened and required to wear a mask. They will be asked to wait in the waiting room for the duration of the patient's stay.  Patients are required to wear a mask when they enter the hospital.        COVID-19 Pre-Screening Questions:  . In the past 7 to 10 days have you had a cough,  shortness of breath, headache, congestion, fever (100 or greater) body aches, chills, sore throat, or sudden loss of taste or sense of smell? No . Have you been around anyone with known Covid 19? No . Have you been around anyone who is awaiting Covid 19 test results in the past 7 to 10 days? No . Have you been around anyone who has been exposed to Covid 19, or has mentioned symptoms of Covid 19 within the past 7 to 10 days? No

## 2019-02-26 ENCOUNTER — Ambulatory Visit (HOSPITAL_COMMUNITY)
Admission: RE | Admit: 2019-02-26 | Discharge: 2019-02-27 | Disposition: A | Discharge status: Home / Self Care | Payer: Medicare HMO | Attending: Interventional Cardiology | Admitting: Interventional Cardiology

## 2019-02-26 ENCOUNTER — Ambulatory Visit (HOSPITAL_COMMUNITY)
Admission: RE | Disposition: A | Discharge status: Home / Self Care | Payer: Medicare HMO | Source: Home / Self Care | Attending: Interventional Cardiology

## 2019-02-26 ENCOUNTER — Other Ambulatory Visit: Payer: Self-pay

## 2019-02-26 DIAGNOSIS — Z853 Personal history of malignant neoplasm of breast: Secondary | ICD-10-CM | POA: Diagnosis not present

## 2019-02-26 DIAGNOSIS — E669 Obesity, unspecified: Secondary | ICD-10-CM | POA: Diagnosis not present

## 2019-02-26 DIAGNOSIS — K219 Gastro-esophageal reflux disease without esophagitis: Secondary | ICD-10-CM | POA: Insufficient documentation

## 2019-02-26 DIAGNOSIS — Z882 Allergy status to sulfonamides status: Secondary | ICD-10-CM | POA: Diagnosis not present

## 2019-02-26 DIAGNOSIS — Z794 Long term (current) use of insulin: Secondary | ICD-10-CM | POA: Diagnosis not present

## 2019-02-26 DIAGNOSIS — Z8673 Personal history of transient ischemic attack (TIA), and cerebral infarction without residual deficits: Secondary | ICD-10-CM | POA: Insufficient documentation

## 2019-02-26 DIAGNOSIS — I209 Angina pectoris, unspecified: Secondary | ICD-10-CM | POA: Diagnosis present

## 2019-02-26 DIAGNOSIS — M1990 Unspecified osteoarthritis, unspecified site: Secondary | ICD-10-CM | POA: Diagnosis not present

## 2019-02-26 DIAGNOSIS — E785 Hyperlipidemia, unspecified: Secondary | ICD-10-CM | POA: Diagnosis not present

## 2019-02-26 DIAGNOSIS — Z6835 Body mass index (BMI) 35.0-35.9, adult: Secondary | ICD-10-CM | POA: Diagnosis not present

## 2019-02-26 DIAGNOSIS — Z23 Encounter for immunization: Secondary | ICD-10-CM | POA: Insufficient documentation

## 2019-02-26 DIAGNOSIS — F329 Major depressive disorder, single episode, unspecified: Secondary | ICD-10-CM | POA: Insufficient documentation

## 2019-02-26 DIAGNOSIS — Z7982 Long term (current) use of aspirin: Secondary | ICD-10-CM | POA: Insufficient documentation

## 2019-02-26 DIAGNOSIS — Z79899 Other long term (current) drug therapy: Secondary | ICD-10-CM | POA: Diagnosis not present

## 2019-02-26 DIAGNOSIS — Z950 Presence of cardiac pacemaker: Secondary | ICD-10-CM | POA: Insufficient documentation

## 2019-02-26 DIAGNOSIS — E1140 Type 2 diabetes mellitus with diabetic neuropathy, unspecified: Secondary | ICD-10-CM | POA: Diagnosis not present

## 2019-02-26 DIAGNOSIS — I10 Essential (primary) hypertension: Secondary | ICD-10-CM | POA: Diagnosis not present

## 2019-02-26 DIAGNOSIS — I25119 Atherosclerotic heart disease of native coronary artery with unspecified angina pectoris: Secondary | ICD-10-CM | POA: Diagnosis not present

## 2019-02-26 DIAGNOSIS — Z955 Presence of coronary angioplasty implant and graft: Secondary | ICD-10-CM

## 2019-02-26 DIAGNOSIS — F419 Anxiety disorder, unspecified: Secondary | ICD-10-CM | POA: Diagnosis not present

## 2019-02-26 DIAGNOSIS — I441 Atrioventricular block, second degree: Secondary | ICD-10-CM | POA: Diagnosis not present

## 2019-02-26 HISTORY — PX: CORONARY STENT INTERVENTION: CATH118234

## 2019-02-26 HISTORY — PX: INTRAVASCULAR PRESSURE WIRE/FFR STUDY: CATH118243

## 2019-02-26 HISTORY — PX: LEFT HEART CATH AND CORONARY ANGIOGRAPHY: CATH118249

## 2019-02-26 LAB — GLUCOSE, CAPILLARY
Glucose-Capillary: 354 mg/dL — ABNORMAL HIGH (ref 70–99)
Glucose-Capillary: 282 mg/dL — ABNORMAL HIGH (ref 70–99)
Glucose-Capillary: 247 mg/dL — ABNORMAL HIGH (ref 70–99)
Glucose-Capillary: 377 mg/dL — ABNORMAL HIGH (ref 70–99)
Glucose-Capillary: 337 mg/dL — ABNORMAL HIGH (ref 70–99)

## 2019-02-26 LAB — POCT ACTIVATED CLOTTING TIME
Activated Clotting Time: 285 seconds
Activated Clotting Time: 472 seconds

## 2019-02-26 SURGERY — LEFT HEART CATH AND CORONARY ANGIOGRAPHY
Anesthesia: LOCAL

## 2019-02-26 MED ORDER — IOHEXOL 350 MG/ML SOLN
INTRAVENOUS | Status: DC | PRN
  Administered 2019-02-26: 150 mL via INTRA_ARTERIAL

## 2019-02-26 MED ORDER — LIDOCAINE HCL (PF) 1 % IJ SOLN
INTRAMUSCULAR | Status: DC | PRN
  Administered 2019-02-26: 2 mL via INTRADERMAL

## 2019-02-26 MED ORDER — VERAPAMIL HCL 2.5 MG/ML IV SOLN
INTRAVENOUS | Status: DC | PRN
  Administered 2019-02-26: 10 mL via INTRA_ARTERIAL

## 2019-02-26 MED ORDER — SODIUM CHLORIDE 0.9% FLUSH: 3.0000 mL | INTRAVENOUS | Status: DC | PRN

## 2019-02-26 MED ORDER — ATORVASTATIN CALCIUM 40 MG PO TABS
40.0000 mg | ORAL_TABLET | Freq: Every day | ORAL | Status: DC
  Administered 2019-02-26: 40 mg via ORAL
  Filled 2019-02-26: qty 1

## 2019-02-26 MED ORDER — INSULIN GLARGINE 100 UNIT/ML ~~LOC~~ SOLN
60.0000 [IU] | Freq: Every day | SUBCUTANEOUS | Status: DC
  Administered 2019-02-26: 60 [IU] via SUBCUTANEOUS
  Filled 2019-02-26 (×3): qty 0.6

## 2019-02-26 MED ORDER — CLOPIDOGREL BISULFATE 300 MG PO TABS
ORAL_TABLET | ORAL | Status: DC | PRN
  Administered 2019-02-26: 600 mg via ORAL

## 2019-02-26 MED ORDER — TIROFIBAN (AGGRASTAT) BOLUS VIA INFUSION
INTRAVENOUS | Status: DC | PRN
  Administered 2019-02-26: 2222.5 ug via INTRAVENOUS

## 2019-02-26 MED ORDER — INSULIN DEGLUDEC 100 UNIT/ML ~~LOC~~ SOPN: 60.0000 [IU] | PEN_INJECTOR | Freq: Every day | SUBCUTANEOUS | Status: DC

## 2019-02-26 MED ORDER — HEPARIN SODIUM (PORCINE) 1000 UNIT/ML IJ SOLN
INTRAMUSCULAR | Status: AC
  Filled 2019-02-26: qty 1

## 2019-02-26 MED ORDER — FLUTICASONE PROPIONATE 50 MCG/ACT NA SUSP
2.0000 | Freq: Every day | NASAL | Status: DC
  Administered 2019-02-27: 2 via NASAL
  Filled 2019-02-26: qty 16

## 2019-02-26 MED ORDER — HYDRALAZINE HCL 20 MG/ML IJ SOLN
INTRAMUSCULAR | Status: DC | PRN
  Administered 2019-02-26: 10 mg via INTRAVENOUS

## 2019-02-26 MED ORDER — NITROGLYCERIN 1 MG/10 ML FOR IR/CATH LAB
INTRA_ARTERIAL | Status: DC | PRN
  Administered 2019-02-26: 400 ug via INTRA_ARTERIAL
  Administered 2019-02-26: 200 ug via INTRACORONARY

## 2019-02-26 MED ORDER — INSULIN ASPART 100 UNIT/ML ~~LOC~~ SOLN
8.0000 [IU] | Freq: Three times a day (TID) | SUBCUTANEOUS | Status: DC
  Administered 2019-02-26 – 2019-02-27 (×2): 8 [IU] via SUBCUTANEOUS

## 2019-02-26 MED ORDER — HEPARIN SODIUM (PORCINE) 1000 UNIT/ML IJ SOLN
INTRAMUSCULAR | Status: DC | PRN
  Administered 2019-02-26: 4500 [IU] via INTRAVENOUS
  Administered 2019-02-26: 5500 [IU] via INTRAVENOUS
  Administered 2019-02-26: 6000 [IU] via INTRAVENOUS

## 2019-02-26 MED ORDER — OXYBUTYNIN CHLORIDE ER 5 MG PO TB24
5.0000 mg | ORAL_TABLET | Freq: Every day | ORAL | Status: DC
  Administered 2019-02-26: 5 mg via ORAL
  Filled 2019-02-26: qty 1

## 2019-02-26 MED ORDER — EZETIMIBE 10 MG PO TABS
10.0000 mg | ORAL_TABLET | Freq: Every day | ORAL | Status: DC
  Administered 2019-02-27: 10 mg via ORAL
  Filled 2019-02-26: qty 1

## 2019-02-26 MED ORDER — LABETALOL HCL 5 MG/ML IV SOLN
INTRAVENOUS | Status: AC
  Filled 2019-02-26: qty 4

## 2019-02-26 MED ORDER — ACETAMINOPHEN 500 MG PO TABS
1000.0000 mg | ORAL_TABLET | Freq: Four times a day (QID) | ORAL | Status: DC | PRN
  Administered 2019-02-26: 1000 mg via ORAL

## 2019-02-26 MED ORDER — TIROFIBAN HCL IN NACL 5-0.9 MG/100ML-% IV SOLN: INTRAVENOUS | Status: DC | PRN

## 2019-02-26 MED ORDER — SODIUM CHLORIDE 0.9% FLUSH
3.0000 mL | Freq: Two times a day (BID) | INTRAVENOUS | Status: DC
  Administered 2019-02-26: 3 mL via INTRAVENOUS

## 2019-02-26 MED ORDER — HEPARIN (PORCINE) IN NACL 1000-0.9 UT/500ML-% IV SOLN
INTRAVENOUS | Status: AC
  Filled 2019-02-26: qty 1000

## 2019-02-26 MED ORDER — NITROGLYCERIN IN D5W 200-5 MCG/ML-% IV SOLN
INTRAVENOUS | Status: AC
  Filled 2019-02-26: qty 250

## 2019-02-26 MED ORDER — ASPIRIN EC 81 MG PO TBEC: 81.0000 mg | DELAYED_RELEASE_TABLET | Freq: Every day | ORAL | Status: DC

## 2019-02-26 MED ORDER — MIDAZOLAM HCL 2 MG/2ML IJ SOLN
INTRAMUSCULAR | Status: AC
  Filled 2019-02-26: qty 2

## 2019-02-26 MED ORDER — PANTOPRAZOLE SODIUM 40 MG PO TBEC
40.0000 mg | DELAYED_RELEASE_TABLET | Freq: Every day | ORAL | Status: DC
  Administered 2019-02-27: 40 mg via ORAL
  Filled 2019-02-26: qty 1

## 2019-02-26 MED ORDER — AMLODIPINE BESYLATE 5 MG PO TABS
5.0000 mg | ORAL_TABLET | Freq: Every day | ORAL | Status: DC
  Administered 2019-02-26: 5 mg via ORAL
  Filled 2019-02-26: qty 1

## 2019-02-26 MED ORDER — METOPROLOL TARTRATE 100 MG PO TABS
100.0000 mg | ORAL_TABLET | Freq: Once | ORAL | Status: AC
  Administered 2019-02-26: 100 mg via ORAL
  Filled 2019-02-26: qty 1

## 2019-02-26 MED ORDER — ADENOSINE 12 MG/4ML IV SOLN
INTRAVENOUS | Status: AC
  Filled 2019-02-26: qty 16

## 2019-02-26 MED ORDER — FENTANYL CITRATE (PF) 100 MCG/2ML IJ SOLN
INTRAMUSCULAR | Status: AC
  Filled 2019-02-26: qty 2

## 2019-02-26 MED ORDER — HEPARIN (PORCINE) IN NACL 1000-0.9 UT/500ML-% IV SOLN
INTRAVENOUS | Status: AC
  Filled 2019-02-26: qty 500

## 2019-02-26 MED ORDER — GABAPENTIN 300 MG PO CAPS
300.0000 mg | ORAL_CAPSULE | Freq: Two times a day (BID) | ORAL | Status: DC
  Administered 2019-02-26 – 2019-02-27 (×2): 300 mg via ORAL
  Filled 2019-02-26 (×2): qty 1

## 2019-02-26 MED ORDER — INSULIN ASPART 100 UNIT/ML FLEXPEN: 8.0000 [IU] | PEN_INJECTOR | Freq: Three times a day (TID) | SUBCUTANEOUS | Status: DC

## 2019-02-26 MED ORDER — SODIUM CHLORIDE 0.9 % IV SOLN
INTRAVENOUS | Status: AC
  Administered 2019-02-26: 16:00:00 via INTRAVENOUS

## 2019-02-26 MED ORDER — LABETALOL HCL 5 MG/ML IV SOLN
10.0000 mg | INTRAVENOUS | Status: AC | PRN
  Administered 2019-02-26: 10 mg via INTRAVENOUS

## 2019-02-26 MED ORDER — INSULIN ASPART 100 UNIT/ML ~~LOC~~ SOLN
0.0000 [IU] | Freq: Three times a day (TID) | SUBCUTANEOUS | Status: DC
  Administered 2019-02-26: 15 [IU] via SUBCUTANEOUS
  Administered 2019-02-26: 5 [IU] via SUBCUTANEOUS
  Administered 2019-02-26: 15 [IU] via SUBCUTANEOUS
  Administered 2019-02-27: 11 [IU] via SUBCUTANEOUS
  Filled 2019-02-26 (×2): qty 1

## 2019-02-26 MED ORDER — ACETAMINOPHEN 325 MG PO TABS: 650.0000 mg | ORAL_TABLET | ORAL | Status: DC | PRN

## 2019-02-26 MED ORDER — FENTANYL CITRATE (PF) 100 MCG/2ML IJ SOLN
INTRAMUSCULAR | Status: DC | PRN
  Administered 2019-02-26 (×4): 25 ug via INTRAVENOUS

## 2019-02-26 MED ORDER — SODIUM CHLORIDE 0.9 % IV SOLN: 250.0000 mL | INTRAVENOUS | Status: DC | PRN

## 2019-02-26 MED ORDER — ONDANSETRON HCL 4 MG/2ML IJ SOLN: 4.0000 mg | Freq: Four times a day (QID) | INTRAMUSCULAR | Status: DC | PRN

## 2019-02-26 MED ORDER — ACETAMINOPHEN 500 MG PO TABS
ORAL_TABLET | ORAL | Status: AC
  Filled 2019-02-26: qty 2

## 2019-02-26 MED ORDER — HYDRALAZINE HCL 20 MG/ML IJ SOLN: 10.0000 mg | INTRAMUSCULAR | Status: AC | PRN

## 2019-02-26 MED ORDER — FAMOTIDINE IN NACL 20-0.9 MG/50ML-% IV SOLN
INTRAVENOUS | Status: AC | PRN
  Administered 2019-02-26: 20 mg via INTRAVENOUS

## 2019-02-26 MED ORDER — SODIUM CHLORIDE 0.9 % WEIGHT BASED INFUSION
3.0000 mL/kg/h | INTRAVENOUS | Status: DC
  Administered 2019-02-26: 3 mL/kg/h via INTRAVENOUS

## 2019-02-26 MED ORDER — TIROFIBAN HCL IN NACL 5-0.9 MG/100ML-% IV SOLN
INTRAVENOUS | Status: AC
  Filled 2019-02-26: qty 100

## 2019-02-26 MED ORDER — SODIUM CHLORIDE 0.9 % WEIGHT BASED INFUSION: 1.0000 mL/kg/h | INTRAVENOUS | Status: DC

## 2019-02-26 MED ORDER — CLOPIDOGREL BISULFATE 300 MG PO TABS
ORAL_TABLET | ORAL | Status: AC
  Filled 2019-02-26: qty 2

## 2019-02-26 MED ORDER — FLUOXETINE HCL 20 MG PO CAPS
40.0000 mg | ORAL_CAPSULE | Freq: Every day | ORAL | Status: DC
  Administered 2019-02-27: 40 mg via ORAL
  Filled 2019-02-26: qty 2

## 2019-02-26 MED ORDER — HYDRALAZINE HCL 20 MG/ML IJ SOLN
INTRAMUSCULAR | Status: AC
  Filled 2019-02-26: qty 1

## 2019-02-26 MED ORDER — LIDOCAINE HCL (PF) 1 % IJ SOLN
INTRAMUSCULAR | Status: AC
  Filled 2019-02-26: qty 30

## 2019-02-26 MED ORDER — SODIUM CHLORIDE 0.9 % IV SOLN
INTRAVENOUS | Status: AC
  Administered 2019-02-26: 14:00:00 via INTRAVENOUS

## 2019-02-26 MED ORDER — MIDAZOLAM HCL 2 MG/2ML IJ SOLN
INTRAMUSCULAR | Status: DC | PRN
  Administered 2019-02-26: 2 mg via INTRAVENOUS
  Administered 2019-02-26 (×2): 1 mg via INTRAVENOUS

## 2019-02-26 MED ORDER — ASPIRIN 81 MG PO CHEW
81.0000 mg | CHEWABLE_TABLET | Freq: Every day | ORAL | Status: DC
  Administered 2019-02-27: 81 mg via ORAL
  Filled 2019-02-26: qty 1

## 2019-02-26 MED ORDER — ASPIRIN 81 MG PO CHEW: 81.0000 mg | CHEWABLE_TABLET | ORAL | Status: DC

## 2019-02-26 MED ORDER — HEPARIN (PORCINE) IN NACL 1000-0.9 UT/500ML-% IV SOLN
INTRAVENOUS | Status: DC | PRN
  Administered 2019-02-26 (×3): 500 mL

## 2019-02-26 MED ORDER — CLOPIDOGREL BISULFATE 75 MG PO TABS
75.0000 mg | ORAL_TABLET | Freq: Every day | ORAL | Status: DC
  Administered 2019-02-27: 75 mg via ORAL
  Filled 2019-02-26: qty 1

## 2019-02-26 MED ORDER — ADENOSINE (DIAGNOSTIC) 140MCG/KG/MIN: INTRAVENOUS | Status: DC | PRN

## 2019-02-26 SURGICAL SUPPLY — 23 items
BALLN SAPPHIRE 2.5X12 (BALLOONS) ×2
BALLN SAPPHIRE ~~LOC~~ 3.0X15 (BALLOONS) ×1 IMPLANT
BALLOON SAPPHIRE 2.5X12 (BALLOONS) IMPLANT
CATH 5FR JL3.5 JR4 ANG PIG MP (CATHETERS) ×1 IMPLANT
CATH LAUNCHER 6FR AL1 (CATHETERS) IMPLANT
CATH LAUNCHER 6FR EBU3.5 (CATHETERS) ×1 IMPLANT
CATHETER LAUNCHER 6FR AL1 (CATHETERS) ×2
DEVICE RAD COMP TR BAND LRG (VASCULAR PRODUCTS) ×1 IMPLANT
GLIDESHEATH SLEND SS 6F .021 (SHEATH) ×1 IMPLANT
GUIDEWIRE INQWIRE 1.5J.035X260 (WIRE) IMPLANT
GUIDEWIRE PRESSURE COMET II (WIRE) ×1 IMPLANT
INQWIRE 1.5J .035X260CM (WIRE) ×2
KIT ENCORE 26 ADVANTAGE (KITS) ×1 IMPLANT
KIT HEART LEFT (KITS) ×2 IMPLANT
KIT HEMO VALVE WATCHDOG (MISCELLANEOUS) ×1 IMPLANT
PACK CARDIAC CATHETERIZATION (CUSTOM PROCEDURE TRAY) ×2 IMPLANT
SHEATH PROBE COVER 6X72 (BAG) ×1 IMPLANT
STENT SYNERGY DES 2.75X24 (Permanent Stent) ×1 IMPLANT
SYR MEDRAD MARK 7 150ML (SYRINGE) ×2 IMPLANT
TRANSDUCER W/STOPCOCK (MISCELLANEOUS) ×2 IMPLANT
TUBING CIL FLEX 10 FLL-RA (TUBING) ×2 IMPLANT
WIRE ASAHI PROWATER 180CM (WIRE) ×1 IMPLANT
WIRE HI TORQ BMW 190CM (WIRE) ×1 IMPLANT

## 2019-02-26 NOTE — Interval H&P Note (Signed)
Cath Lab Visit (complete for each Cath Lab visit)  Clinical Evaluation Leading to the Procedure:   ACS: No.  Non-ACS:    Anginal Classification: CCS III  Anti-ischemic medical therapy: Minimal Therapy (1 class of medications)  Non-Invasive Test Results: High-risk stress test findings: cardiac mortality >3%/year  Prior CABG: No previous CABG      History and Physical Interval Note:  02/26/2019 7:37 AM  Kristen [name]  has presented today for surgery, with the diagnosis of Chest pain.  The various methods of treatment have been discussed with the patient and family. After consideration of risks, benefits and other options for treatment, the patient has consented to  Procedure(s): LEFT HEART CATH AND CORONARY ANGIOGRAPHY (N/A) as a surgical intervention.  The patient's history has been reviewed, patient examined, no change in status, stable for surgery.  I have reviewed the patient's chart and labs.  Questions were answered to the patient's satisfaction.     Larae Grooms

## 2019-02-26 NOTE — Plan of Care (Signed)

## 2019-02-27 ENCOUNTER — Encounter (HOSPITAL_COMMUNITY): Payer: Self-pay | Admitting: Interventional Cardiology

## 2019-02-27 DIAGNOSIS — F329 Major depressive disorder, single episode, unspecified: Secondary | ICD-10-CM | POA: Diagnosis not present

## 2019-02-27 DIAGNOSIS — I25119 Atherosclerotic heart disease of native coronary artery with unspecified angina pectoris: Secondary | ICD-10-CM | POA: Diagnosis not present

## 2019-02-27 DIAGNOSIS — I10 Essential (primary) hypertension: Secondary | ICD-10-CM | POA: Diagnosis not present

## 2019-02-27 DIAGNOSIS — F419 Anxiety disorder, unspecified: Secondary | ICD-10-CM | POA: Diagnosis not present

## 2019-02-27 DIAGNOSIS — E785 Hyperlipidemia, unspecified: Secondary | ICD-10-CM | POA: Diagnosis not present

## 2019-02-27 DIAGNOSIS — Z882 Allergy status to sulfonamides status: Secondary | ICD-10-CM | POA: Diagnosis not present

## 2019-02-27 DIAGNOSIS — I209 Angina pectoris, unspecified: Secondary | ICD-10-CM | POA: Diagnosis not present

## 2019-02-27 DIAGNOSIS — M1990 Unspecified osteoarthritis, unspecified site: Secondary | ICD-10-CM | POA: Diagnosis not present

## 2019-02-27 DIAGNOSIS — E669 Obesity, unspecified: Secondary | ICD-10-CM | POA: Diagnosis not present

## 2019-02-27 DIAGNOSIS — Z23 Encounter for immunization: Secondary | ICD-10-CM | POA: Diagnosis not present

## 2019-02-27 LAB — BASIC METABOLIC PANEL
Anion gap: 12 (ref 5–15)
BUN: 18 mg/dL (ref 8–23)
CO2: 26 mmol/L (ref 22–32)
Calcium: 8.8 mg/dL — ABNORMAL LOW (ref 8.9–10.3)
Chloride: 99 mmol/L (ref 98–111)
Creatinine, Ser: 1.08 mg/dL — ABNORMAL HIGH (ref 0.44–1.00)
GFR calc Af Amer: 59 mL/min — ABNORMAL LOW (ref >60)
GFR calc non Af Amer: 51 mL/min — ABNORMAL LOW (ref >60)
Glucose, Bld: 305 mg/dL — ABNORMAL HIGH (ref 70–99)
Potassium: 4.7 mmol/L (ref 3.5–5.1)
Sodium: 137 mmol/L (ref 135–145)

## 2019-02-27 LAB — CBC
HCT: 42.9 % (ref 36.0–46.0)
Hemoglobin: 13.5 g/dL (ref 12.0–15.0)
MCH: 30.2 pg (ref 26.0–34.0)
MCHC: 31.5 g/dL (ref 30.0–36.0)
MCV: 96 fL (ref 80.0–100.0)
Platelets: 261 10*3/uL (ref 150–400)
RBC: 4.47 MIL/uL (ref 3.87–5.11)
RDW: 14.5 % (ref 11.5–15.5)
WBC: 8.3 10*3/uL (ref 4.0–10.5)
nRBC: 0 % (ref 0.0–0.2)

## 2019-02-27 LAB — GLUCOSE, CAPILLARY: Glucose-Capillary: 324 mg/dL — ABNORMAL HIGH (ref 70–99)

## 2019-02-27 MED ORDER — NITROGLYCERIN 0.4 MG SL SUBL: 0.4000 mg | SUBLINGUAL_TABLET | SUBLINGUAL | 12 refills | Status: DC | PRN

## 2019-02-27 MED ORDER — INFLUENZA VAC A&B SA ADJ QUAD 0.5 ML IM PRSY
0.5000 mL | PREFILLED_SYRINGE | Freq: Once | INTRAMUSCULAR | Status: AC
  Administered 2019-02-27: 0.5 mL via INTRAMUSCULAR
  Filled 2019-02-27: qty 0.5

## 2019-02-27 MED ORDER — PANTOPRAZOLE SODIUM 40 MG PO TBEC: 40.0000 mg | DELAYED_RELEASE_TABLET | Freq: Every day | ORAL | 2 refills | Status: DC

## 2019-02-27 MED ORDER — CLOPIDOGREL BISULFATE 75 MG PO TABS: 75.0000 mg | ORAL_TABLET | Freq: Every day | ORAL | 11 refills | Status: DC

## 2019-02-27 MED FILL — NITROGLYCERIN 0.4 MG TAB SL: 0.4 | 8 days supply | Qty: 25 | Fill #0

## 2019-02-27 MED FILL — CLOPIDOGREL 75 MG TABLET: 75 | 30 days supply | Qty: 30 | Fill #0

## 2019-02-27 MED FILL — PANTOPRAZOLE SOD DR 40 MG T: 40 | 30 days supply | Qty: 30 | Fill #0

## 2019-02-27 MED FILL — Tirofiban HCl in NaCl 0.9% IV Soln 5 MG/100ML (Base Equiv): INTRAVENOUS | Qty: 100 | Status: AC

## 2019-02-27 NOTE — Discharge Summary (Addendum)
Discharge Summary    Patient ID: Kristen [name] MRN: [id number]; DOB: Apr 19, 1946  Admit date: 02/26/2019 Discharge date: 02/27/2019  Primary Care Provider: Alycia Rossetti, MD  Primary Cardiologist: Kathlen Brunswick be follow at [facility] Electrophysiologist:  Dr. Rayann Heman   Discharge Diagnoses    Active Problems:   Angina pectoris ([place])    CAD    HTN   HLD  Allergies Allergies  Allergen Reactions   Sulfonamide Derivatives Hives    Diagnostic Studies/Procedures   CORONARY STENT INTERVENTION  INTRAVASCULAR PRESSURE WIRE/FFR STUDY  LEFT HEART CATH AND CORONARY ANGIOGRAPHY  Conclusion    Mid LAD lesion is 90% stenosed.  A drug-eluting stent was successfully placed using a STENT SYNERGY DES 2.75X24.  Post intervention, there is a 0% residual stenosis.  Prox RCA-1 lesion is 50% stenosed. Nonsignificant by FFR.  Prox RCA-2 lesion is 25% stenosed.  Mid Cx lesion is 25% stenosed.  The left ventricular systolic function is normal.  LV end diastolic pressure is normal.  The left ventricular ejection fraction is 55-65% by visual estimate.  There is no aortic valve stenosis.   COntiue dual antiplatelet therapy and aggressive secondary prevention.    She did not have someone who could stay with her so she will be watched overnight.    Diagnostic Dominance: Right  Intervention      History of Present Illness     Kristen [name] is a 73 y.o. female with a hx of Breast CA, DM, HTN, obesity, HLD, recurrent syncope with bifascicular block s/p PPM 12/2016,  anxiety/depression, arthritis and CVA presented for outpatient cath.   Prior history includes left heart cath 01/2009 showing 40% LAD, 40% D2, 50% distal LAD, 20-30% AV cx, 20-30% luminal Cx, 30% prox RCA, 40-50% mRCA, LVEF normal. Nuc 12/2012 was intermediate risk - defect of the apex that appears to be mostly due to breast attenuation but also a very small area of reversible ischemia, EF normal at 74% - this was done for  cardiac clearance for breast surgery.   Prior hx of recurrent syncope dating back to 2016 which felt like orthostatic. Baseline conduction system disease with RBBB, LAFB. She had sudden syncope without warning and underwent pacemaker 12/2016. Echo 7/18 EF  55%.   She was admitted [facility] with substernal sharp chest pain early spring 2020. Discharged with outpatient evaluation. She was seen by Dr. Rayann Heman and managed medically given COVID 19 pandemic. She eventually had coronary CT 02/2019 due to ongoing symptoms that showed severe stenosis of mLAD and ostial diagonal (both with positive FFR). Recommended cath.   Hospital Course     Consultants: None  Cath showed 90% mLAD stenosis s/p synergy DES. Non significant FFR RCA 50% lesion. Observed overnight due to social issue. No complications. Renal function stable.  Ambulated well. DAPT with ASA and plavix. LDL 56. Continue Lipitor 42m. [facility] [place].   The patient been seen by Dr. CSallyanne Kustertoday and deemed ready for discharge home. All follow-up appointments have been scheduled. Discharge medications are listed below.   Some studies suggest Prilosec/Omeprazole interacts with Plavix. We changed your Prilosec/Omeprazole to Protonix for less chance of interaction.   Discharge Vitals Blood pressure 130/70, pulse 62, temperature 98.2 F (36.8 C), temperature source Oral, resp. rate 18, height _0  (1.6 m), weight 91.8 kg, SpO2 95 %.  Filed Weights   02/26/19 0542 02/27/19 0541  Weight: 88.9 kg 91.8 kg   Physical Exam  Constitutional: She  is oriented to person, place, and time and well-developed, well-nourished, and in no distress.  HENT:  Head: Normocephalic and atraumatic.  Eyes: Pupils are equal, round, and reactive to light. Conjunctivae are normal.  Neck: Normal range of motion. Neck supple.  Cardiovascular: Normal rate and regular rhythm.  Radial cath site without hematoma    Pulmonary/Chest: Effort normal and breath sounds normal.  Abdominal: Soft. Bowel sounds are normal.  Musculoskeletal: Normal range of motion.  Neurological: She is alert and oriented to person, place, and time.  Skin: Skin is warm and dry.  Psychiatric: Affect normal.   Labs & Radiologic Studies    CBC Recent Labs    02/27/19 0402  WBC 8.3  HGB 13.5  HCT 42.9  MCV 96.0  PLT 448   Basic Metabolic Panel Recent Labs    02/27/19 0402  NA 137  K 4.7  CL 99  CO2 26  GLUCOSE 305*  BUN 18  CREATININE 1.08*  CALCIUM 8.8*  _____________  Ct Coronary Morph W/cta Cor W/score W/ca W/cm &/or Wo/cm  Addendum Date: 02/18/2019   ADDENDUM REPORT: 02/18/2019 21:00 CLINICAL DATA:  73 year old female with h/o AVB, s/p PM placement and chest pain. EXAM: Cardiac/Coronary  CTA TECHNIQUE: The patient was scanned on a [employer]. FINDINGS: A 100 kV prospective scan was triggered in the descending thoracic aorta at 111 HU's. Axial non-contrast 3 mm slices were carried out through the heart. The data set was analyzed on a dedicated work station and scored using the [place]. Gantry rotation speed was 250 msecs and collimation was .6 mm. No beta blockade and 0.8 mg of sl NTG was given. The 3D data set was reconstructed in 5% intervals of the 67-82 % of the R-R cycle. Diastolic phases were analyzed on a dedicated work station using MPR, MIP and VRT modes. The patient received 80 cc of contrast. Aorta:  Normal size.  No calcifications.  No dissection. Aortic Valve:  Trileaflet.  No calcifications. Coronary Arteries:  Normal coronary origin.  Right dominance. RCA is a large dominant artery that gives rise to PDA and PLA. There is moderate predominantly calcified plaque in the proximal RCA with stenosis 50-69%. Mid and distal RCA have mild diffuse plaque with maximum stenosis 25-49%. PDA/PLA have only mild diffuse plaque. Left main is a large artery that gives rise to LAD, ramus intermedius and  LCX arteries. Left main has no plaque. LAD is a large vessel that has moderate circumferential calcified plaque in the proximal segment with stenosis 50-69%. This is followed by a focal, severe, mixed plaque with stenosis > 70%. Mid to distal LAD has mild diffuse predominantly calcified plaque. D1 is very small. D2 has a severe focal calcified plaque in the ostial portion, however vessel lumen is very small (1.8 mm). Ramus intermedius is a medium caliber artery with only minimal non-calcified plaque with stenosis 0-24%. LCX is a non-dominant artery that has minimal diffuse non-calcified plaque with stenosis 0-24%. Other findings: Normal pulmonary vein drainage into the left atrium. Normal left atrial appendage without a thrombus. Normal size of the pulmonary artery. IMPRESSION: 1. Coronary calcium score of 0. This was 0 percentile for age and sex matched control. 2. Normal coronary origin with right dominance. 3. CAD-RADS 4. Severe stenosis in the mid LAD and in the ostial portion of a very small 2. diagonal artery (not amenable to intervention). CT FFR will be submitted. Electronically Signed   By: Ena Dawley   On: 02/18/2019 21:00  Result Date: 02/18/2019 EXAM: OVER-READ INTERPRETATION  CT CHEST The following report is an over-read performed by radiologist Dr. Suzy Bouchard of [facility], [place] on 02/15/2019. This over-read does not include interpretation of cardiac or coronary anatomy or pathology. The coronary CTA interpretation by the cardiologist is attached. COMPARISON:  None. FINDINGS: Limited view of the lung parenchyma demonstrates no suspicious nodularity. Airways are normal. Limited view of the mediastinum demonstrates no adenopathy. Esophagus normal. Limited view of the upper abdomen unremarkable. Limited view of the skeleton and chest wall is unremarkable. IMPRESSION: No significant extracardiac findings Electronically Signed: By: Suzy Bouchard M.D. On: 02/15/2019 16:51   Ct  Coronary Fractional Flow Reserve Data Prep  Result Date: 02/18/2019 EXAM: CT FFR ANALYSIS CLINICAL DATA:  73 year old female with chest pain. FINDINGS: FFRct analysis was performed on the original cardiac CT angiogram dataset. Diagrammatic representation of the FFRct analysis is provided in a separate PDF document in PACS. This dictation was created using the PDF document and an interactive 3D model of the results. 3D model is not available in the EMR/PACS. Normal FFR range is >0.80. 1. Left Main: 1.0. 2. LAD: Proximal: 0.98, mid: 0.52. 3. LCX: 0.95. 4. Ramus intermedius: 0.96 5. RCA: Proximal: 0.98, mid: 0.84, distal: 0.81. IMPRESSION: 1. CT FFR analysis showed severe stenosis in the mid LAD and in the ostial portion of a very small 2. diagonal artery (not amenable to intervention). A cardiac catheterization is recommended. Electronically Signed   By: Ena Dawley   On: 02/18/2019 21:02   Disposition   Pt is being discharged home today in good condition.  Follow-up Plans & Appointments    Follow-up Information    Satira Sark, MD. Go on 03/21/2019.   Specialty: Cardiology Why: _0 :20pm for hospital follow up  Contact information: [place] [address] [phone]          Discharge Instructions    Amb Referral to Cardiac Rehabilitation   Complete by: As directed    Diagnosis: Coronary Stents   After initial evaluation and assessments completed: Virtual Based Care may be provided alone or in conjunction with Phase 2 Cardiac Rehab based on patient barriers.: Yes   Diet - low sodium heart healthy   Complete by: As directed    Discharge instructions   Complete by: As directed    No driving for 48 hours . No lifting over 5 lbs for 1 week. No sexual activity for 1 week. Keep procedure site clean & dry. If you notice increased pain, swelling, bleeding or pus, call/return!  You may shower, but no soaking baths/hot tubs/pools for 1 week.   Hold metformin 2 days.  Resume 03/01/2019.  Some studies suggest Prilosec/Omeprazole interacts with Plavix. We changed your Prilosec/Omeprazole to Protonix for less chance of interaction.   Increase activity slowly   Complete by: As directed       Discharge Medications   Allergies as of 02/27/2019      Reactions   Sulfonamide Derivatives Hives      Medication List    STOP taking these medications   acetaminophen 500 MG tablet Commonly known as: TYLENOL   omeprazole 20 MG capsule Commonly known as: PRILOSEC     TAKE these medications   amLODipine 5 MG tablet Commonly known as: NORVASC TAKE 1 TABLET EVERY DAY What changed: when to take this   aspirin EC 81 MG tablet Take 81 mg by mouth daily.   atorvastatin 40 MG tablet Commonly known  as: LIPITOR Take 1 tablet (40 mg total) by mouth at bedtime.   blood glucose meter kit and supplies Kit Dispense based on patient and insurance preference. Use to monitor FSBS 3x daily. Dx: E11.41.   Blood Glucose System Pak Kit Dispense based on patient and insurance preference. (Accu-Chek Aviva Plus) Use to monitor FSBS 3x daily. Dx: E11.41.   clopidogrel 75 MG tablet Commonly known as: PLAVIX Take 1 tablet (75 mg total) by mouth daily with breakfast. Start taking on: February 28, 2019   ezetimibe 10 MG tablet Commonly known as: ZETIA TAKE 1 TABLET (10 MG TOTAL) BY MOUTH DAILY.   FLUoxetine 40 MG capsule Commonly known as: PROZAC Take 1 capsule (40 mg total) by mouth daily.   fluticasone 50 MCG/ACT nasal spray Commonly known as: FLONASE Place 2 sprays into both nostrils daily. What changed:   when to take this  reasons to take this   gabapentin 300 MG capsule Commonly known as: NEURONTIN TAKE ONE CAPSULE BY MOUTH 2 TIMES A DAY What changed:   how much to take  how to take this  when to take this   glucose blood test strip Dispense based on patient and insurance preference. (Accu-Chek Aviva Plus) Use to monitor FSBS 3x daily. Dx:  E11.41.   Insulin Pen Needle 32G X 4 MM Misc Commonly known as: Unifine Pentips USE TO INJECT TRESIBA AND NOVOLOG EVERY DAY AS DIRECTED   isosorbide mononitrate 30 MG 24 hr tablet Commonly known as: IMDUR Take 1 tablet (30 mg total) by mouth daily.   Lancets Misc Dispense based on patient and insurance preference. Use to monitor FSBS 3x daily. Dx: E11.41.   metFORMIN 1000 MG tablet Commonly known as: GLUCOPHAGE TAKE 1 TABLET TWICE DAILY WITH MEALS What changed: when to take this   metoprolol tartrate 100 MG tablet Commonly known as: LOPRESSOR Take 1 tablet (100 mg total) by mouth once for 1 dose.   nitroGLYCERIN 0.4 MG SL tablet Commonly known as: Nitrostat Place 1 tablet (0.4 mg total) under the tongue every 5 (five) minutes as needed.   NovoLOG FlexPen 100 UNIT/ML FlexPen Generic drug: insulin aspart INJECT 8 UNITS SUBCUTANEOUSLY THREE TIMES DAILY WITH MEALS (NEED MD APPOINTMENT FOR REFILLS) What changed: See the new instructions.   oxybutynin 5 MG 24 hr tablet Commonly known as: DITROPAN-XL Take 1 tablet (5 mg total) by mouth at bedtime.   pantoprazole 40 MG tablet Commonly known as: PROTONIX Take 1 tablet (40 mg total) by mouth daily at 6 (six) AM. Further refills by PCP Start taking on: February 28, 2019   Tyler Aas FlexTouch 100 UNIT/ML Sopn FlexTouch Pen Generic drug: insulin degludec INJECT 60 UNITS SUBCUTANEOUSLY EVERY DAY (NEED MD APPOINTMENT FOR REFILLS) What changed: See the new instructions.        Acute coronary syndrome (MI, NSTEMI, STEMI, etc) this admission?: No.    Outstanding Labs/Studies   None  Duration of Discharge Encounter   Greater than 30 minutes including physician time.  SignedLeanor Kail, PA 02/27/2019, 9:36 AM  I have seen and examined the patient along with Leanor Kail, PA.  I have reviewed the chart, notes and new data.  I agree with PA/NP's note.  Key new complaints: no angina, no access site  complaints Key examination changes: healthy radial access, normal CV exam Key new findings / data: angios reviewed  PLAN: Reinforced mandatory compliance with DAPT and role of lipid lowering therapy  Sanda Klein, MD, [facility] [phone] 02/27/2019, 10:15 AM

## 2019-02-27 NOTE — Progress Notes (Signed)
CARDIAC REHAB PHASE I   PRE:  Rate/Rhythm: 70 paced  BP:  Sitting: 122/72      SaO2: 98 RA  MODE:  Ambulation: 230 ft   POST:  Rate/Rhythm: 90 paced with PVCs  BP:  Sitting: 117/66    SaO2: 96 RA  Pt ambulated 247ft in hallway standby assist with front wheel walker. Pt denies CP, SOB, or dizziness. Pt educated on importance of ASA, Plavix, and NTG. Pt given heart healthy and diabetic diets. Reviewed restrictions, site care, and exercise guidelines. Will refer to CRP II Andrew.  [id number] Rufina Falco, RN BSN 02/27/2019 9:33 AM

## 2019-02-27 NOTE — Progress Notes (Signed)
Prentiss Bells, RN gave pt discharge instructions and she stated understanding. Went over site care and medication list as well as follow appointments. Gave pt additional packets to read over about angina, stents and CAD. Pt daughter at bedside. TOC pharmacy dropped off medications, belongings packed no additional questions. IV removed

## 2019-02-28 ENCOUNTER — Encounter (HOSPITAL_COMMUNITY): Payer: Self-pay | Admitting: Interventional Cardiology

## 2019-02-28 MED FILL — Adenosine IV Soln 12 MG/4ML: INTRAVENOUS | Qty: 16 | Status: AC

## 2019-03-19 ENCOUNTER — Telehealth: Payer: Self-pay | Admitting: Adult Health

## 2019-03-19 NOTE — Telephone Encounter (Signed)
Returned patient's phone call regarding rescheduling an appointment, left a voicemail. 

## 2019-03-20 ENCOUNTER — Telehealth: Payer: Self-pay | Admitting: Adult Health

## 2019-03-20 NOTE — Telephone Encounter (Signed)
R/s appt per 10/6 sch message - pt daughter is aware of appt date and time

## 2019-03-21 ENCOUNTER — Ambulatory Visit (INDEPENDENT_AMBULATORY_CARE_PROVIDER_SITE_OTHER): Payer: Medicare HMO | Admitting: Cardiology

## 2019-03-21 ENCOUNTER — Telehealth: Payer: Self-pay

## 2019-03-21 ENCOUNTER — Other Ambulatory Visit: Payer: Self-pay

## 2019-03-21 ENCOUNTER — Encounter: Payer: Self-pay | Admitting: Cardiology

## 2019-03-21 VITALS — BP 104/63 | HR 72 | Ht 62.0 in | Wt 204.2 lb

## 2019-03-21 DIAGNOSIS — I25119 Atherosclerotic heart disease of native coronary artery with unspecified angina pectoris: Secondary | ICD-10-CM

## 2019-03-21 DIAGNOSIS — Z950 Presence of cardiac pacemaker: Secondary | ICD-10-CM

## 2019-03-21 DIAGNOSIS — E1165 Type 2 diabetes mellitus with hyperglycemia: Secondary | ICD-10-CM | POA: Diagnosis not present

## 2019-03-21 DIAGNOSIS — I10 Essential (primary) hypertension: Secondary | ICD-10-CM

## 2019-03-21 DIAGNOSIS — E782 Mixed hyperlipidemia: Secondary | ICD-10-CM | POA: Diagnosis not present

## 2019-03-21 NOTE — Progress Notes (Signed)
° ° °Cardiology Office Note ° °Date: 03/21/2019  ° °ID: Kristen [name], DOB 08/15/1945, MRN [id number] ° °PCP:  [place], Kawanta F, MD  °Evaluating Cardiologist:  Samuel McDowell, MD °Electrophysiologist:  None  ° °Chief Complaint  °Patient presents with  °• Cardiac follow-up  ° ° °History of Present Illness: °Kristen [name] is a 73 y.o. female that I am meeting for the first time in clinic today.  I reviewed extensive records and updated the chart.  It looks like she has been followed most recently by Dr. Allred and Dr. Klein.  She has a history of symptomatic second-degree heart block status post pacemaker in July 2018.  With chest pain symptoms she was referred for a cardiac CTA by Dr. Allred in September which demonstrated evidence of severe mid LAD stenosis also involving the ostial diagonal by FFR and she was referred for cardiac catheterization.  Procedure was performed by Dr. Varanasi on September 15 demonstrating a 90% mid LAD stenosis that was managed with DES.  She otherwise had mild to moderate nonobstructive disease with insignificant DFR/FFR evaluation of the RCA.  LVEF was 55 to 65%. ° °She has a Biotronik pacemaker in place, followed by Dr. Allred at this point.  Device interrogation in July showed normal function.  She is due to follow-up for device interrogation tomorrow in [place]. ° °Today we discussed her recent testing.  She states that she has been taking her medications regularly.  We discussed the importance of dual antiplatelet therapy.  She is on Lipitor and Zetia with recent LDL 56. ° °Past Medical History:  °Diagnosis Date  °• Anemia   °• Anxiety   °• Arthritis   °• Bifascicular block   °• Blood transfusion without reported diagnosis   °• CAD (coronary artery disease)   ° DES to LAD 02/2019  °• Depression   °• Diabetic neuropathy (HCC)   °• Essential hypertension   °• GERD (gastroesophageal reflux disease)   °• Hemorrhoids   °• History of stroke   ° a. MRI 12/2016 - remote left cerebellar  infarcts incidentally noted.  °• Hyperlipidemia   °• Malignant neoplasm of upper-outer quadrant of left female breast (HCC) 11/30/2012  °• Nephrolithiasis   °• Obesity   °• Overactive bladder   °• Personal history of colonic polyps   ° 09/2010 - 2 diminutive adenomas 02/12/2016 5 mm descending polyp - prolapse type polyp (not precancerous) no recall needed given hx, findings and age   °• S/P placement of cardiac pacemaker 01/03/2017  °• Type 2 diabetes mellitus (HCC)   °• Vitamin D deficiency   ° ° °Past Surgical History:  °Procedure Laterality Date  °• APPENDECTOMY    °• BACK SURGERY    °• BREAST SURGERY    °• CARDIAC CATHETERIZATION  01/2009  ° cath by Dr Stuckey revealed nonobstructive CAD  °• CESAREAN SECTION    °• CHOLECYSTECTOMY    °• CHOLECYSTECTOMY, LAPAROSCOPIC    °• COLONOSCOPY    ° last 2012- with polyps  °• CORONARY STENT INTERVENTION N/A 02/26/2019  ° Procedure: CORONARY STENT INTERVENTION;  Surgeon: Varanasi, Jayadeep S, MD;  [facility]: [facility];  Service: Cardiovascular;  Laterality: N/A;  °• INTRAVASCULAR PRESSURE WIRE/FFR STUDY N/A 02/26/2019  ° Procedure: INTRAVASCULAR PRESSURE WIRE/FFR STUDY;  Surgeon: Varanasi, Jayadeep S, MD;  Location: [facility];  Service: Cardiovascular;  Laterality: N/A;  °• JOINT REPLACEMENT    °• LEFT HEART CATH AND CORONARY ANGIOGRAPHY N/A 02/26/2019  ° Procedure: LEFT HEART CATH   CATH AND CORONARY ANGIOGRAPHY;  Surgeon: Jettie Booze, MD;  Location: [place];  Service: Cardiovascular;  Laterality: N/A;   LITHOTRIPSY     MASTECTOMY W/ SENTINEL NODE BIOPSY Left 12/24/2012   Procedure: LEFT MASTECTOMY WITH LEFT SENTINEL LYMPH NODE BIOPSY;  Surgeon: Rolm Bookbinder, MD;  Location: [place];  Service: General;  Laterality: Left;   OOPHORECTOMY Right    1.5  ovaries rem   PACEMAKER IMPLANT N/A 12/19/2016   Procedure: Pacemaker Implant;  Surgeon: Deboraha Sprang, MD;  Location: [facility];  Service: Cardiovascular;  Laterality: N/A;   PARTIAL  HIP ARTHROPLASTY     left hip   POLYPECTOMY     REPLACEMENT TOTAL KNEE     both knees   TONSILLECTOMY     TOTAL MASTECTOMY Left 12/24/2012   Dr Donne Hazel    Current Outpatient Medications  Medication Sig Dispense Refill   amLODipine (NORVASC) 5 MG tablet TAKE 1 TABLET EVERY DAY (Patient taking differently: Take 5 mg by mouth at bedtime. ) 90 tablet 3   aspirin EC 81 MG tablet Take 81 mg by mouth daily.     atorvastatin (LIPITOR) 40 MG tablet Take 1 tablet (40 mg total) by mouth at bedtime. 90 tablet 3   blood glucose meter kit and supplies KIT Dispense based on patient and insurance preference. Use to monitor FSBS 3x daily. Dx: E11.41. 1 each 0   Blood Glucose Monitoring Suppl (BLOOD GLUCOSE SYSTEM PAK) KIT Dispense based on patient and insurance preference. (Accu-Chek Aviva Plus) Use to monitor FSBS 3x daily. Dx: E11.41. 1 each 1   clopidogrel (PLAVIX) 75 MG tablet Take 1 tablet (75 mg total) by mouth daily with breakfast. 30 tablet 11   ezetimibe (ZETIA) 10 MG tablet TAKE 1 TABLET (10 MG TOTAL) BY MOUTH DAILY. 90 tablet 2   FLUoxetine (PROZAC) 40 MG capsule Take 1 capsule (40 mg total) by mouth daily. 90 capsule 2   fluticasone (FLONASE) 50 MCG/ACT nasal spray Place 2 sprays into both nostrils daily. (Patient taking differently: Place 2 sprays into both nostrils daily as needed (sinus issues). ) 48 g 3   gabapentin (NEURONTIN) 300 MG capsule TAKE ONE CAPSULE BY MOUTH 2 TIMES A DAY (Patient taking differently: Take 300 mg by mouth 2 (two) times daily. TAKE ONE CAPSULE BY MOUTH 2 TIMES A DAY) 180 capsule 3   glucose blood test strip Dispense based on patient and insurance preference. (Accu-Chek Aviva Plus) Use to monitor FSBS 3x daily. Dx: E11.41. 300 each 3   Insulin Pen Needle (UNIFINE PENTIPS) 32G X 4 MM MISC USE TO INJECT TRESIBA AND NOVOLOG EVERY DAY AS DIRECTED 100 each 0   isosorbide mononitrate (IMDUR) 30 MG 24 hr tablet Take 1 tablet (30 mg total) by mouth daily. 90  tablet 3   Lancets MISC Dispense based on patient and insurance preference. Use to monitor FSBS 3x daily. Dx: E11.41. 300 each 3   metFORMIN (GLUCOPHAGE) 1000 MG tablet TAKE 1 TABLET TWICE DAILY WITH MEALS (Patient taking differently: Take 1,000 mg by mouth 2 (two) times daily. ) 180 tablet 3   nitroGLYCERIN (NITROSTAT) 0.4 MG SL tablet Place 1 tablet (0.4 mg total) under the tongue every 5 (five) minutes as needed. 25 tablet 12   NOVOLOG FLEXPEN 100 UNIT/ML FlexPen INJECT 8 UNITS SUBCUTANEOUSLY THREE TIMES DAILY WITH MEALS (NEED MD APPOINTMENT FOR REFILLS) (Patient taking differently: Inject 8 Units into the skin 3 (three) times daily with meals. ) 30 mL  0   oxybutynin (DITROPAN-XL) 5 MG 24 hr tablet Take 1 tablet (5 mg total) by mouth at bedtime. 90 tablet 3   pantoprazole (PROTONIX) 40 MG tablet Take 1 tablet (40 mg total) by mouth daily at 6 (six) AM. Further refills by PCP 30 tablet 2   TRESIBA FLEXTOUCH 100 UNIT/ML SOPN FlexTouch Pen INJECT 60 UNITS SUBCUTANEOUSLY EVERY DAY (NEED MD APPOINTMENT FOR REFILLS) (Patient taking differently: Inject 60 Units into the skin at bedtime. ) 60 mL 0   No current facility-administered medications for this visit.    Allergies:  Sulfonamide derivatives   Social History: The patient  reports that she has never smoked. She has never used smokeless tobacco. She reports that she does not drink alcohol or use drugs.   ROS:  Please see the history of present illness. Otherwise, complete review of systems is positive for arthritic pains.  All other systems are reviewed and negative.   Physical Exam: VS:  BP 104/63    Pulse 72    Ht 5' 2" (1.575 m)    Wt 204 lb 3.2 oz (92.6 kg)    SpO2 97%    BMI 37.35 kg/m , BMI Body mass index is 37.35 kg/m.  Wt Readings from Last 3 Encounters:  03/21/19 204 lb 3.2 oz (92.6 kg)  02/27/19 202 lb 4.8 oz (91.8 kg)  02/20/19 196 lb (88.9 kg)    General: Elderly woman, appears comfortable at rest. HEENT: Conjunctiva  and lids normal, wearing a mask. Neck: Supple, no elevated JVP or carotid bruits, no thyromegaly. Lungs: Creased breath sounds without wheezing, nonlabored breathing at rest. Cardiac: Regular rate and rhythm, no S3, soft systolic murmur. Abdomen: Soft, nontender, bowel sounds present. Extremities: Trace ankle edema, distal pulses 2+. Skin: Warm and dry. Musculoskeletal: No kyphosis. Neuropsychiatric: Alert and oriented x3, affect grossly appropriate.  ECG:  An ECG dated 02/27/2019 was personally reviewed today and demonstrated:  Dual-chamber paced rhythm.  Recent Labwork: 10/16/2018: ALT 21; AST 15 02/27/2019: BUN 18; Creatinine, Ser 1.08; Hemoglobin 13.5; Platelets 261; Potassium 4.7; Sodium 137     Component Value Date/Time   CHOL 116 10/16/2018 1232   TRIG 159 (H) 10/16/2018 1232   HDL 36 (L) 10/16/2018 1232   CHOLHDL 3.2 10/16/2018 1232   VLDL 23 10/31/2016 1151   LDLCALC 56 10/16/2018 1232    Other Studies Reviewed Today:  Cardiac catheterization 02/26/2019:  Mid LAD lesion is 90% stenosed.  A drug-eluting stent was successfully placed using a STENT SYNERGY DES 2.75X24.  Post intervention, there is a 0% residual stenosis.  Prox RCA-1 lesion is 50% stenosed. Nonsignificant by DFR/FFR.  Prox RCA-2 lesion is 25% stenosed.  Mid Cx lesion is 25% stenosed.  The left ventricular systolic function is normal.  LV end diastolic pressure is normal.  The left ventricular ejection fraction is 55-65% by visual estimate.  There is no aortic valve stenosis.   Contiue dual antiplatelet therapy and aggressive secondary prevention.    Echocardiogram 12/13/2016: Study Conclusions  - Left ventricle: The cavity size was normal. Wall thickness was   increased in a pattern of moderate LVH. Systolic function was   normal. The estimated ejection fraction was in the range of 55%   to 60%. Normal GLPSS at -21%. Wall motion was normal; there were   no regional wall motion abnormalities.  Doppler parameters are   consistent with abnormal left ventricular relaxation (grade 1   diastolic dysfunction). The E/e&' ratio is between 8-15,   suggesting indeterminate  LV filling pressure. - Aortic valve: Trileaflet. Sclerosis without stenosis. There was   no regurgitation. - Tricuspid valve: There was mild regurgitation. - Pulmonary arteries: PA peak pressure: 34 mm Hg (S). - Inferior vena cava: The vessel was normal in size. The   respirophasic diameter changes were in the normal range (>= 50%),   consistent with normal central venous pressure.  Impressions:  - Compared to a prior echo in 2016, there are no significant   changes.  Assessment and Plan:  1.  CAD status post recent DES intervention to the mid LAD as outlined above, otherwise mild to moderate nonobstructive disease to be managed medically.  Plan is to continue aspirin and Plavix, also statin therapy.  2.  History of symptomatic second-degree heart block and syncope status post Biotronik pacemaker with follow-up by Dr. Rayann Heman.  3.  Hyperlipidemia, on Lipitor and Zetia with recent LDL 56.  4.  Essential hypertension, on Norvasc.  5.  Uncontrolled type 2 diabetes mellitus, on Glucophage and insulin with follow-up by Dr. Buelah Manis.  Last hemoglobin A1c 8.9%.  Medication Adjustments/Labs and Tests Ordered: Current medicines are reviewed at length with the patient today.  Concerns regarding medicines are outlined above.   Tests Ordered: No orders of the defined types were placed in this encounter.   Medication Changes: No orders of the defined types were placed in this encounter.   Disposition:  Follow up 6 months in the [place] office.  Signed, Satira Sark, MD, [facility] 03/21/2019 3:03 PM    [facility] [place], [place], [place] [address] Phone: [phone]; Fax: [phone]

## 2019-03-21 NOTE — Telephone Encounter (Signed)
Spoke with pt regarding her appt on 03/22/19. Pt stated she does not have access to MyChart and would like a phonecall for her visit. Pt questions were address.

## 2019-03-21 NOTE — Patient Instructions (Addendum)

## 2019-03-22 ENCOUNTER — Telehealth (INDEPENDENT_AMBULATORY_CARE_PROVIDER_SITE_OTHER): Payer: Medicare HMO | Admitting: Internal Medicine

## 2019-03-22 ENCOUNTER — Other Ambulatory Visit: Payer: Self-pay

## 2019-03-22 ENCOUNTER — Encounter: Payer: Self-pay | Admitting: Internal Medicine

## 2019-03-22 VITALS — Ht 62.0 in | Wt 200.0 lb

## 2019-03-22 DIAGNOSIS — I441 Atrioventricular block, second degree: Secondary | ICD-10-CM | POA: Diagnosis not present

## 2019-03-22 DIAGNOSIS — I25119 Atherosclerotic heart disease of native coronary artery with unspecified angina pectoris: Secondary | ICD-10-CM

## 2019-03-22 DIAGNOSIS — I10 Essential (primary) hypertension: Secondary | ICD-10-CM

## 2019-03-22 NOTE — Progress Notes (Signed)
Electrophysiology TeleHealth Note  Due to national recommendations of social distancing due to [place] 19, an audio telehealth visit is felt to be most appropriate for this patient at this time.  Verbal consent was obtained by me for the telehealth visit today.  The patient does not have capability for a virtual visit.  A phone visit is therefore required today.   Date:  03/22/2019   ID:  Kristen [name], DOB March 21, 1946, MRN [id number]  Location: patient's home  Provider location:  [facility]  Evaluation Performed: Follow-up visit  PCP:  Alycia Rossetti, MD   Electrophysiologist:  Dr Rayann Heman  Chief Complaint:  SOB  History of Present Illness:    Kristen [name] is a 73 y.o. female who presents via telehealth conferencing today.  Since lher recent cath, the patient reports doing very well.  Her SOB has improved s/p PCI of the mid LAD.  Today, she denies symptoms of palpitations, chest pain,  lower extremity edema, dizziness, presyncope, or syncope.  The patient is otherwise without complaint today.   Past Medical History:  Diagnosis Date  . Anemia   . Anxiety   . Arthritis   . Bifascicular block   . Blood transfusion without reported diagnosis   . CAD (coronary artery disease)    DES to LAD 02/2019  . Depression   . Diabetic neuropathy ([place])   . Essential hypertension   . GERD (gastroesophageal reflux disease)   . Hemorrhoids   . History of stroke    a. MRI 12/2016 - remote left cerebellar infarcts incidentally noted.  . Hyperlipidemia   . Malignant neoplasm of upper-outer quadrant of left female breast ([place]) 11/30/2012  . Nephrolithiasis   . Obesity   . Overactive bladder   . Personal history of colonic polyps    09/2010 - 2 diminutive adenomas 02/12/2016 5 mm descending polyp - prolapse type polyp (not precancerous) no recall needed given hx, findings and age   . S/P placement of cardiac pacemaker 01/03/2017  . Type 2 diabetes mellitus ([place])   . Vitamin D deficiency      Past Surgical History:  Procedure Laterality Date  . APPENDECTOMY    . BACK SURGERY    . BREAST SURGERY    . CARDIAC CATHETERIZATION  01/2009   cath by Dr Lia Foyer revealed nonobstructive CAD  . CESAREAN SECTION    . CHOLECYSTECTOMY    . CHOLECYSTECTOMY, LAPAROSCOPIC    . COLONOSCOPY     last 2012- with polyps  . CORONARY STENT INTERVENTION N/A 02/26/2019   Procedure: CORONARY STENT INTERVENTION;  Surgeon: Jettie Booze, MD;  Location: [facility];  Service: Cardiovascular;  Laterality: N/A;  . INTRAVASCULAR PRESSURE WIRE/FFR STUDY N/A 02/26/2019   Procedure: INTRAVASCULAR PRESSURE WIRE/FFR STUDY;  Surgeon: Jettie Booze, MD;  Location: [place] [facility];  Service: Cardiovascular;  Laterality: N/A;  . JOINT REPLACEMENT    . LEFT HEART CATH AND CORONARY ANGIOGRAPHY N/A 02/26/2019   Procedure: LEFT HEART CATH AND CORONARY ANGIOGRAPHY;  Surgeon: Jettie Booze, MD;  Location: [facility];  Service: Cardiovascular;  Laterality: N/A;  . LITHOTRIPSY    . MASTECTOMY W/ SENTINEL NODE BIOPSY Left 12/24/2012   Procedure: LEFT MASTECTOMY WITH LEFT SENTINEL LYMPH NODE BIOPSY;  Surgeon: Rolm Bookbinder, MD;  Location: [facility];  Service: General;  Laterality: Left;  . OOPHORECTOMY Right    1.5  ovaries rem  . PACEMAKER IMPLANT N/A 12/19/2016   Procedure: Pacemaker Implant;  Surgeon:  Deboraha Sprang, MD;  Location: [place] [facility];  Service: Cardiovascular;  Laterality: N/A;  . PARTIAL HIP ARTHROPLASTY     left hip  . POLYPECTOMY    . REPLACEMENT TOTAL KNEE     both knees  . TONSILLECTOMY    . TOTAL MASTECTOMY Left 12/24/2012   Dr Donne Hazel    Current Outpatient Medications  Medication Sig Dispense Refill  . amLODipine (NORVASC) 5 MG tablet TAKE 1 TABLET EVERY DAY (Patient taking differently: Take 5 mg by mouth at bedtime. ) 90 tablet 3  . aspirin EC 81 MG tablet Take 81 mg by mouth daily.    Marland Kitchen atorvastatin (LIPITOR) 40 MG tablet Take 1 tablet (40 mg total) by  mouth at bedtime. 90 tablet 3  . blood glucose meter kit and supplies KIT Dispense based on patient and insurance preference. Use to monitor FSBS 3x daily. Dx: E11.41. 1 each 0  . Blood Glucose Monitoring Suppl (BLOOD GLUCOSE SYSTEM PAK) KIT Dispense based on patient and insurance preference. (Accu-Chek Aviva Plus) Use to monitor FSBS 3x daily. Dx: E11.41. 1 each 1  . clopidogrel (PLAVIX) 75 MG tablet Take 1 tablet (75 mg total) by mouth daily with breakfast. 30 tablet 11  . ezetimibe (ZETIA) 10 MG tablet TAKE 1 TABLET (10 MG TOTAL) BY MOUTH DAILY. 90 tablet 2  . FLUoxetine (PROZAC) 40 MG capsule Take 1 capsule (40 mg total) by mouth daily. 90 capsule 2  . fluticasone (FLONASE) 50 MCG/ACT nasal spray Place 2 sprays into both nostrils daily. (Patient taking differently: Place 2 sprays into both nostrils daily as needed (sinus issues). ) 48 g 3  . gabapentin (NEURONTIN) 300 MG capsule TAKE ONE CAPSULE BY MOUTH 2 TIMES A DAY (Patient taking differently: Take 300 mg by mouth 2 (two) times daily. TAKE ONE CAPSULE BY MOUTH 2 TIMES A DAY) 180 capsule 3  . glucose blood test strip Dispense based on patient and insurance preference. (Accu-Chek Aviva Plus) Use to monitor FSBS 3x daily. Dx: E11.41. 300 each 3  . Insulin Pen Needle (UNIFINE PENTIPS) 32G X 4 MM MISC USE TO INJECT TRESIBA AND NOVOLOG EVERY DAY AS DIRECTED 100 each 0  . isosorbide mononitrate (IMDUR) 30 MG 24 hr tablet Take 1 tablet (30 mg total) by mouth daily. 90 tablet 3  . Lancets MISC Dispense based on patient and insurance preference. Use to monitor FSBS 3x daily. Dx: E11.41. 300 each 3  . metFORMIN (GLUCOPHAGE) 1000 MG tablet TAKE 1 TABLET TWICE DAILY WITH MEALS (Patient taking differently: Take 1,000 mg by mouth 2 (two) times daily. ) 180 tablet 3  . nitroGLYCERIN (NITROSTAT) 0.4 MG SL tablet Place 1 tablet (0.4 mg total) under the tongue every 5 (five) minutes as needed. 25 tablet 12  . NOVOLOG FLEXPEN 100 UNIT/ML FlexPen INJECT 8 UNITS  SUBCUTANEOUSLY THREE TIMES DAILY WITH MEALS (NEED MD APPOINTMENT FOR REFILLS) (Patient taking differently: Inject 8 Units into the skin 3 (three) times daily with meals. ) 30 mL 0  . oxybutynin (DITROPAN-XL) 5 MG 24 hr tablet Take 1 tablet (5 mg total) by mouth at bedtime. 90 tablet 3  . pantoprazole (PROTONIX) 40 MG tablet Take 1 tablet (40 mg total) by mouth daily at 6 (six) AM. Further refills by PCP 30 tablet 2  . TRESIBA FLEXTOUCH 100 UNIT/ML SOPN FlexTouch Pen INJECT 60 UNITS SUBCUTANEOUSLY EVERY DAY (NEED MD APPOINTMENT FOR REFILLS) (Patient taking differently: Inject 60 Units into the skin at bedtime. ) 60 mL 0  No current facility-administered medications for this visit.     Allergies:   Sulfonamide derivatives   Social History:  The patient  reports that she has never smoked. She has never used smokeless tobacco. She reports that she does not drink alcohol or use drugs.   Family History:  The patient's family history includes Brain cancer in her brother; Breast cancer in her mother and sister; Cancer in her mother; Heart attack (age of onset: 79) in her father; Lung cancer in her brother and brother; Lung cancer (age of onset: 88) in her brother.   ROS:  Please see the history of present illness.   All other systems are personally reviewed and negative.    Exam:    Vital Signs:  Ht _0  (1.575 m)   Wt 200 lb (90.7 kg)   BMI 36.58 kg/m   Well sounding, alert and conversant   Labs/Other Tests and Data Reviewed:    Recent Labs: 10/16/2018: ALT 21 02/27/2019: BUN 18; Creatinine, Ser 1.08; Hemoglobin 13.5; Platelets 261; Potassium 4.7; Sodium 137   Wt Readings from Last 3 Encounters:  03/22/19 200 lb (90.7 kg)  03/21/19 204 lb 3.2 oz (92.6 kg)  02/27/19 202 lb 4.8 oz (91.8 kg)     Last device remote is reviewed from [facility] PDF which reveals normal device function, no arrhythmias   ASSESSMENT & PLAN:    1.  Second degree AV block Remotes are uptodate No changes  2.  CAD  chest pain/ SOB Resolved s/p PCI of LAD Follow with Dr Domenic Polite as scheduled  3, HTN Stable No change required today  Follow-up:  Return to see me in [place] in a year   Patient Risk:  after full review of this patients clinical status, I feel that they are at moderate risk at this time.  Today, I have spent 15 minutes with the patient with telehealth technology discussing arrhythmia management .    SignedThompson Grayer, MD  03/22/2019 2:40 PM     [place] [address] [address] [phone] (office) [phone] (fax)

## 2019-03-27 ENCOUNTER — Ambulatory Visit (INDEPENDENT_AMBULATORY_CARE_PROVIDER_SITE_OTHER): Payer: Medicare HMO | Admitting: *Deleted

## 2019-03-27 DIAGNOSIS — R55 Syncope and collapse: Secondary | ICD-10-CM

## 2019-03-27 DIAGNOSIS — I4510 Unspecified right bundle-branch block: Secondary | ICD-10-CM | POA: Diagnosis not present

## 2019-03-27 LAB — CUP PACEART REMOTE DEVICE CHECK
Date Time Interrogation Session: 20201014212748
Implantable Lead Implant Date: 20180709
Implantable Lead Implant Date: 20180709
Implantable Lead Location: 753859
Implantable Lead Location: 753860
Implantable Lead Model: 5076
Implantable Lead Model: 5076
Implantable Pulse Generator Implant Date: 20180709
Pulse Gen Model: 407145
Pulse Gen Serial Number: 69106657

## 2019-03-29 ENCOUNTER — Encounter: Payer: Medicare Other | Admitting: Adult Health

## 2019-04-01 ENCOUNTER — Encounter: Payer: Medicare HMO | Admitting: Internal Medicine

## 2019-04-02 ENCOUNTER — Other Ambulatory Visit: Payer: Self-pay | Admitting: Family Medicine

## 2019-04-09 NOTE — Progress Notes (Signed)
Remote pacemaker transmission.   

## 2019-04-26 ENCOUNTER — Other Ambulatory Visit: Payer: Self-pay | Admitting: Family Medicine

## 2019-04-30 ENCOUNTER — Inpatient Hospital Stay: Payer: Medicare HMO | Attending: Adult Health | Admitting: Adult Health

## 2019-04-30 ENCOUNTER — Other Ambulatory Visit: Payer: Self-pay

## 2019-04-30 ENCOUNTER — Encounter: Payer: Self-pay | Admitting: Adult Health

## 2019-04-30 VITALS — BP 135/69 | HR 80 | Temp 97.8°F | Resp 17 | Ht 62.0 in | Wt 206.7 lb

## 2019-04-30 DIAGNOSIS — Z79811 Long term (current) use of aromatase inhibitors: Secondary | ICD-10-CM | POA: Diagnosis not present

## 2019-04-30 DIAGNOSIS — Z8673 Personal history of transient ischemic attack (TIA), and cerebral infarction without residual deficits: Secondary | ICD-10-CM | POA: Diagnosis not present

## 2019-04-30 DIAGNOSIS — E1140 Type 2 diabetes mellitus with diabetic neuropathy, unspecified: Secondary | ICD-10-CM | POA: Diagnosis not present

## 2019-04-30 DIAGNOSIS — Z79899 Other long term (current) drug therapy: Secondary | ICD-10-CM | POA: Insufficient documentation

## 2019-04-30 DIAGNOSIS — F329 Major depressive disorder, single episode, unspecified: Secondary | ICD-10-CM | POA: Diagnosis not present

## 2019-04-30 DIAGNOSIS — F419 Anxiety disorder, unspecified: Secondary | ICD-10-CM | POA: Insufficient documentation

## 2019-04-30 DIAGNOSIS — Z90721 Acquired absence of ovaries, unilateral: Secondary | ICD-10-CM | POA: Diagnosis not present

## 2019-04-30 DIAGNOSIS — E2839 Other primary ovarian failure: Secondary | ICD-10-CM | POA: Diagnosis not present

## 2019-04-30 DIAGNOSIS — Z9012 Acquired absence of left breast and nipple: Secondary | ICD-10-CM | POA: Insufficient documentation

## 2019-04-30 DIAGNOSIS — Z1231 Encounter for screening mammogram for malignant neoplasm of breast: Secondary | ICD-10-CM

## 2019-04-30 DIAGNOSIS — E785 Hyperlipidemia, unspecified: Secondary | ICD-10-CM | POA: Insufficient documentation

## 2019-04-30 DIAGNOSIS — Z803 Family history of malignant neoplasm of breast: Secondary | ICD-10-CM | POA: Insufficient documentation

## 2019-04-30 DIAGNOSIS — Z170 Estrogen receptor positive status [ER+]: Secondary | ICD-10-CM | POA: Diagnosis not present

## 2019-04-30 DIAGNOSIS — Z801 Family history of malignant neoplasm of trachea, bronchus and lung: Secondary | ICD-10-CM | POA: Insufficient documentation

## 2019-04-30 DIAGNOSIS — Z8249 Family history of ischemic heart disease and other diseases of the circulatory system: Secondary | ICD-10-CM | POA: Insufficient documentation

## 2019-04-30 DIAGNOSIS — I10 Essential (primary) hypertension: Secondary | ICD-10-CM | POA: Diagnosis not present

## 2019-04-30 DIAGNOSIS — Z7982 Long term (current) use of aspirin: Secondary | ICD-10-CM | POA: Insufficient documentation

## 2019-04-30 DIAGNOSIS — C50412 Malignant neoplasm of upper-outer quadrant of left female breast: Secondary | ICD-10-CM | POA: Insufficient documentation

## 2019-04-30 DIAGNOSIS — Z794 Long term (current) use of insulin: Secondary | ICD-10-CM | POA: Insufficient documentation

## 2019-04-30 NOTE — Progress Notes (Signed)
CLINIC:  Survivorship   REASON FOR VISIT:  Routine follow-up for history of breast cancer.   BRIEF ONCOLOGIC HISTORY:  Oncology History  Malignant neoplasm of upper-outer quadrant of left female breast ([place])  11/30/2012 Initial Diagnosis   Malignant neoplasm of upper-outer quadrant of left female breast: Left breast bloody nipple discharge that led to mammogram. Initial biopsy revealed DCIS ER/PR positive   12/24/2012 Surgery   Left mastectomy with sentinel lymph node biopsy. Invasive ductal carcinoma grade 2 with 2 foci each measuring less than 0.1 cm with high-grade DCIS 2.6 cm and 1.4 cm 6 sentinel nodes negative ER 100% PR 100% HER-2 negative Ki-67 14%   01/31/2013 - 03/27/2018 Anti-estrogen oral therapy   Aromasin 25 mg once daily      INTERVAL HISTORY:  Ms. Kristen [name] presents to the [facility] today for routine follow-up for her history of breast cancer.  Overall, she reports feeling quite well.   She notes she has intermittent pain under her left arm that happens briefly about once per week.  This is chronic.  Her weight is up, and she notes that this typically happens during the cold months.  She also notes she has a hard time with controlling her appetite sometimes.  She has diabetes and notes her sugars have been running normal recently.    She sees Dr. Buelah Manis, her PCP regularly.  She is s/p left mastectomy, and continues to have mammograms, but she cannot recall the location.  Her last mammogram she cannot remember the date but states it has been a while.  Her last mammogram we have on file was on 04/15/2016 and showed no evidence of malignancy and breast density category A.  Her last bone density we have on record was in 2016 and was normal.  She is a never smoker.  Kristen [name] has graduated from colonoscopies.  Her last colonoscopy was in 02/2016.  She doesn't have regular skin checks.    REVIEW OF SYSTEMS:  Review of Systems  Constitutional: Negative for appetite change,  chills, fatigue, fever and unexpected weight change.  HENT:   Negative for hearing loss, lump/mass and trouble swallowing.   Eyes: Negative for eye problems and icterus.  Respiratory: Negative for chest tightness, cough and shortness of breath.   Cardiovascular: Negative for chest pain, leg swelling and palpitations.  Gastrointestinal: Negative for abdominal distention, abdominal pain, constipation, diarrhea, nausea and vomiting.  Endocrine: Negative for hot flashes.  Genitourinary: Negative for difficulty urinating.   Musculoskeletal: Negative for arthralgias.  Skin: Negative for itching and rash.  Neurological: Negative for dizziness, extremity weakness, headaches and numbness.  Hematological: Negative for adenopathy. Does not bruise/bleed easily.  Psychiatric/Behavioral: Negative for depression. The patient is not nervous/anxious.   Breast: Denies any new nodularity, masses, tenderness, nipple changes, or nipple discharge.       PAST MEDICAL/SURGICAL HISTORY:  Past Medical History:  Diagnosis Date  . Anemia   . Anxiety   . Arthritis   . Bifascicular block   . Blood transfusion without reported diagnosis   . CAD (coronary artery disease)    DES to LAD 02/2019  . Depression   . Diabetic neuropathy ([place])   . Essential hypertension   . GERD (gastroesophageal reflux disease)   . Hemorrhoids   . History of stroke    a. MRI 12/2016 - remote left cerebellar infarcts incidentally noted.  . Hyperlipidemia   . Malignant neoplasm of upper-outer quadrant of left female breast ([place]) 11/30/2012  . Nephrolithiasis   .  Obesity   . Overactive bladder   . Personal history of colonic polyps    09/2010 - 2 diminutive adenomas 02/12/2016 5 mm descending polyp - prolapse type polyp (not precancerous) no recall needed given hx, findings and age   . S/P placement of cardiac pacemaker 01/03/2017  . Type 2 diabetes mellitus ([place])   . Vitamin D deficiency    Past Surgical History:  Procedure  Laterality Date  . APPENDECTOMY    . BACK SURGERY    . BREAST SURGERY    . CARDIAC CATHETERIZATION  01/2009   cath by Dr Lia Foyer revealed nonobstructive CAD  . CESAREAN SECTION    . CHOLECYSTECTOMY    . CHOLECYSTECTOMY, LAPAROSCOPIC    . COLONOSCOPY     last 2012- with polyps  . CORONARY STENT INTERVENTION N/A 02/26/2019   Procedure: CORONARY STENT INTERVENTION;  Surgeon: Jettie Booze, MD;  Location: [facility];  Service: Cardiovascular;  Laterality: N/A;  . INTRAVASCULAR PRESSURE WIRE/FFR STUDY N/A 02/26/2019   Procedure: INTRAVASCULAR PRESSURE WIRE/FFR STUDY;  Surgeon: Jettie Booze, MD;  Location: [facility];  Service: Cardiovascular;  Laterality: N/A;  . JOINT REPLACEMENT    . LEFT HEART CATH AND CORONARY ANGIOGRAPHY N/A 02/26/2019   Procedure: LEFT HEART CATH AND CORONARY ANGIOGRAPHY;  Surgeon: Jettie Booze, MD;  Location: [facility];  Service: Cardiovascular;  Laterality: N/A;  . LITHOTRIPSY    . MASTECTOMY W/ SENTINEL NODE BIOPSY Left 12/24/2012   Procedure: LEFT MASTECTOMY WITH LEFT SENTINEL LYMPH NODE BIOPSY;  Surgeon: Rolm Bookbinder, MD;  Location: [facility];  Service: General;  Laterality: Left;  . OOPHORECTOMY Right    1.5  ovaries rem  . PACEMAKER IMPLANT N/A 12/19/2016   Procedure: Pacemaker Implant;  Surgeon: Deboraha Sprang, MD;  Location: [facility];  Service: Cardiovascular;  Laterality: N/A;  . PARTIAL HIP ARTHROPLASTY     left hip  . POLYPECTOMY    . REPLACEMENT TOTAL KNEE     both knees  . TONSILLECTOMY    . TOTAL MASTECTOMY Left 12/24/2012   Dr Donne Hazel     ALLERGIES:  Allergies  Allergen Reactions  . Sulfonamide Derivatives Hives     CURRENT MEDICATIONS:  Outpatient Encounter Medications as of 04/30/2019  Medication Sig  . ACCU-CHEK AVIVA PLUS test strip TEST FINGERSTICK BLOOD SUGAR THREE TIMES DAILY  . amLODipine (NORVASC) 5 MG tablet TAKE 1 TABLET EVERY DAY (Patient taking differently: Take 5 mg by  mouth at bedtime. )  . aspirin EC 81 MG tablet Take 81 mg by mouth daily.  Marland Kitchen atorvastatin (LIPITOR) 40 MG tablet Take 1 tablet (40 mg total) by mouth at bedtime.  . blood glucose meter kit and supplies KIT Dispense based on patient and insurance preference. Use to monitor FSBS 3x daily. Dx: E11.41.  Marland Kitchen Blood Glucose Monitoring Suppl (BLOOD GLUCOSE SYSTEM PAK) KIT Dispense based on patient and insurance preference. (Accu-Chek Aviva Plus) Use to monitor FSBS 3x daily. Dx: E11.41.  . clopidogrel (PLAVIX) 75 MG tablet Take 1 tablet (75 mg total) by mouth daily with breakfast.  . ezetimibe (ZETIA) 10 MG tablet TAKE 1 TABLET (10 MG TOTAL) BY MOUTH DAILY.  Marland Kitchen FLUoxetine (PROZAC) 40 MG capsule Take 1 capsule (40 mg total) by mouth daily.  . fluticasone (FLONASE) 50 MCG/ACT nasal spray Place 2 sprays into both nostrils daily. (Patient taking differently: Place 2 sprays into both nostrils daily as needed (sinus issues). )  . gabapentin (NEURONTIN) 300  MG capsule TAKE ONE CAPSULE BY MOUTH 2 TIMES A DAY (Patient taking differently: Take 300 mg by mouth 2 (two) times daily. TAKE ONE CAPSULE BY MOUTH 2 TIMES A DAY)  . insulin aspart (NOVOLOG FLEXPEN) 100 UNIT/ML FlexPen Inject 8 Units into the skin 3 (three) times daily with meals.  . Insulin Pen Needle (UNIFINE PENTIPS) 32G X 4 MM MISC USE TO INJECT TRESIBA AND NOVOLOG EVERY DAY AS DIRECTED  . isosorbide mononitrate (IMDUR) 30 MG 24 hr tablet Take 1 tablet (30 mg total) by mouth daily.  . Lancets MISC Dispense based on patient and insurance preference. Use to monitor FSBS 3x daily. Dx: E11.41.  . metFORMIN (GLUCOPHAGE) 1000 MG tablet TAKE 1 TABLET TWICE DAILY WITH MEALS (Patient taking differently: Take 1,000 mg by mouth 2 (two) times daily. )  . nitroGLYCERIN (NITROSTAT) 0.4 MG SL tablet Place 1 tablet (0.4 mg total) under the tongue every 5 (five) minutes as needed.  Marland Kitchen oxybutynin (DITROPAN-XL) 5 MG 24 hr tablet TAKE 1 TABLET AT BEDTIME  . pantoprazole (PROTONIX)  40 MG tablet Take 1 tablet (40 mg total) by mouth daily at 6 (six) AM. Further refills by PCP  . TRESIBA FLEXTOUCH 100 UNIT/ML SOPN FlexTouch Pen Inject 0.6 mLs (60 Units total) into the skin at bedtime.   No facility-administered encounter medications on file as of 04/30/2019.      ONCOLOGIC FAMILY HISTORY:  Family History  Problem Relation Age of Onset  . Cancer Mother        mouth cancer  . Breast cancer Mother        possibly dx in her 68s  . Lung cancer Brother 5  . Lung cancer Brother   . Lung cancer Brother   . Heart attack Father 103  . Breast cancer Sister   . Brain cancer Brother   . Colon cancer Neg Hx   . Colon polyps Neg Hx   . Rectal cancer Neg Hx   . Stomach cancer Neg Hx   . Esophageal cancer Neg Hx     GENETIC COUNSELING/TESTING: Not at this time  SOCIAL HISTORY:  Social History   Socioeconomic History  . Marital status: Widowed    Spouse name: Not on file  . Number of children: 2  . Years of education: Not on file  . Highest education level: Not on file  Occupational History  . Not on file  Social Needs  . Financial resource strain: Not on file  . Food insecurity    Worry: Not on file    Inability: Not on file  . Transportation needs    Medical: Not on file    Non-medical: Not on file  Tobacco Use  . Smoking status: Never Smoker  . Smokeless tobacco: Never Used  Substance and Sexual Activity  . Alcohol use: No  . Drug use: No  . Sexual activity: Not on file  Lifestyle  . Physical activity    Days per week: Not on file    Minutes per session: Not on file  . Stress: Not on file  Relationships  . Social Herbalist on phone: Not on file    Gets together: Not on file    Attends religious service: Not on file    Active member of club or organization: Not on file    Attends meetings of clubs or organizations: Not on file    Relationship status: Not on file  . Intimate partner violence    Fear  of current or ex partner: Not on  file    Emotionally abused: Not on file    Physically abused: Not on file    Forced sexual activity: Not on file  Other Topics Concern  . Not on file  Social History Narrative   Lives in [place] with daughter.   Retired      PHYSICAL EXAMINATION:  Vital Signs: Vitals:   04/30/19 1103  BP: 135/69  Pulse: 80  Resp: 17  Temp: 97.8 F (36.6 C)  SpO2: 97%   Filed Weights   04/30/19 1103  Weight: 206 lb 11.2 oz (93.8 kg)   General: Well-nourished, well-appearing female in no acute distress.  Unaccompanied today.   HEENT: Head is normocephalic.  Pupils equal and reactive to light. Conjunctivae clear without exudate.  Sclerae anicteric. Oral mucosa is pink, moist.  Oropharynx is pink without lesions or erythema.  Lymph: No cervical, supraclavicular, or infraclavicular lymphadenopathy noted on palpation.  Cardiovascular: Regular rate and rhythm.Marland Kitchen Respiratory: Clear to auscultation bilaterally. Chest expansion symmetric; breathing non-labored.  Breast Exam:  -Left breast: s/p mastectomy, no sign of local recurrence -Right breast: No appreciable masses on palpation. No skin redness, thickening, or peau d'orange appearance; no nipple retraction or nipple discharge. -Axilla: No axillary adenopathy bilaterally.  GI: Abdomen soft and round; non-tender, non-distended. Bowel sounds normoactive. No hepatosplenomegaly.   GU: Deferred.  Neuro: No focal deficits. Steady gait.  Psych: Mood and affect normal and appropriate for situation.  MSK: No focal spinal tenderness to palpation, full range of motion in bilateral upper extremities Extremities: No edema. Skin: Warm and dry.  LABORATORY DATA:  None for this visit   DIAGNOSTIC IMAGING:  Most recent mammogram: overdue by 3 years    ASSESSMENT AND PLAN:  Ms.. Kristen [name] is a pleasant 73 y.o. female with history of Stage IA left breast invasive ductal carcinoma, ER+/PR+/HER2-, diagnosed in 2014, treated with mastectomy, and  anti-estrogen therapy with Aromasin x 5 years completing therapy in 03/2018.  [facility] for surveillance and routine follow-up.   1. History of breast cancer:  Ms. Kristen [name] is currently clinically and radiographically without evidence of disease or recurrence of breast cancer. Kristen [name] has not undergone right breast screening mammogram in 3 years based on my chart review, and her history.  I let her know that this is against recommendations.  I placed orders for her to have a mammogram in the next 1-2 weeks at the [facility], and that she should have annual mammograms.  She understands this.  She will return in 1 year for follow up.  I encouraged her to call me with any questions or concerns before her next visit at the cancer center, and I would be happy to see her sooner, if needed.    2. Bone health:  Given Ms. Kristen [name]'s age, history of breast cancer, and her previous anti-estrogen therapy with Aromasin, she is at risk for bone demineralization. Her last DEXA scan was in 2016 and was normal.  She is due for a repeat bone density test and I placed orders for this today.  In the meantime, she was encouraged to increase her consumption of foods rich in calcium, as well as increase her weight-bearing activities.  She was given education on specific food and activities to promote bone health.  3. Cancer screening:  Due to Ms. Kristen [name]'s history and her age, she should receive screening for skin cancers. She was encouraged to follow-up with her PCP for appropriate cancer  screenings.   4. Health maintenance and wellness promotion: Ms. Kristen [name] was encouraged to consume 5-7 servings of fruits and vegetables per day. She was also encouraged to engage in moderate to vigorous exercise for 30 minutes per day most days of the week. She was instructed to limit her alcohol consumption and continue to abstain from tobacco use.    Dispo:  -Return to cancer center in one year for LTS follow  up -Mammogram and bone density overdue   A total of (30) minutes of face-to-face time was spent with this patient with greater than 50% of that time in counseling and care-coordination.   Gardenia Phlegm, NP Survivorship Program [facility] [phone]   Note: PRIMARY CARE PROVIDER [place], [place] [phone]

## 2019-05-01 ENCOUNTER — Telehealth: Payer: Self-pay | Admitting: Adult Health

## 2019-05-01 NOTE — Telephone Encounter (Signed)
I talk with patient regarding schedule  

## 2019-05-20 ENCOUNTER — Other Ambulatory Visit: Payer: Self-pay | Admitting: Family Medicine

## 2019-05-28 ENCOUNTER — Other Ambulatory Visit: Payer: Self-pay

## 2019-05-28 MED ORDER — CLOPIDOGREL BISULFATE 75 MG PO TABS: 75.0000 mg | ORAL_TABLET | Freq: Every day | ORAL | 3 refills | Status: DC

## 2019-05-28 MED ORDER — PANTOPRAZOLE SODIUM 40 MG PO TBEC: 40.0000 mg | DELAYED_RELEASE_TABLET | Freq: Every day | ORAL | 3 refills | Status: DC

## 2019-06-05 ENCOUNTER — Other Ambulatory Visit: Payer: Self-pay | Admitting: Family Medicine

## 2019-06-20 ENCOUNTER — Other Ambulatory Visit: Payer: Self-pay | Admitting: Family Medicine

## 2019-06-26 ENCOUNTER — Other Ambulatory Visit: Payer: Self-pay | Admitting: Family Medicine

## 2019-06-26 ENCOUNTER — Ambulatory Visit (INDEPENDENT_AMBULATORY_CARE_PROVIDER_SITE_OTHER): Payer: Medicare HMO | Admitting: *Deleted

## 2019-06-26 DIAGNOSIS — I4510 Unspecified right bundle-branch block: Secondary | ICD-10-CM | POA: Diagnosis not present

## 2019-06-26 LAB — CUP PACEART REMOTE DEVICE CHECK
Battery Remaining Percentage: 80 %
Brady Statistic RA Percent Paced: 52 %
Brady Statistic RV Percent Paced: 18 %
Date Time Interrogation Session: 20210113173858
Implantable Lead Implant Date: 20180709
Implantable Lead Implant Date: 20180709
Implantable Lead Location: 753859
Implantable Lead Location: 753860
Implantable Lead Model: 5076
Implantable Lead Model: 5076
Implantable Pulse Generator Implant Date: 20180709
Lead Channel Impedance Value: 390 Ohm
Lead Channel Impedance Value: 741 Ohm
Lead Channel Pacing Threshold Amplitude: 0.5 V
Lead Channel Pacing Threshold Amplitude: 0.6 V
Lead Channel Pacing Threshold Pulse Width: 0.4 ms
Lead Channel Pacing Threshold Pulse Width: 0.4 ms
Lead Channel Sensing Intrinsic Amplitude: 14.3 mV
Lead Channel Sensing Intrinsic Amplitude: 3.8 mV
Lead Channel Setting Pacing Amplitude: 2 V
Lead Channel Setting Pacing Amplitude: 2.4 V
Lead Channel Setting Pacing Pulse Width: 0.4 ms
Pulse Gen Model: 407145
Pulse Gen Serial Number: 69106657

## 2019-07-24 ENCOUNTER — Other Ambulatory Visit: Payer: Self-pay | Admitting: Family Medicine

## 2019-07-29 ENCOUNTER — Other Ambulatory Visit: Payer: Self-pay | Admitting: Family Medicine

## 2019-08-09 ENCOUNTER — Other Ambulatory Visit: Payer: Self-pay | Admitting: Family Medicine

## 2019-08-16 ENCOUNTER — Ambulatory Visit
Admission: RE | Admit: 2019-08-16 | Discharge: 2019-08-16 | Disposition: A | Discharge status: Home / Self Care | Payer: Medicare HMO | Source: Ambulatory Visit | Attending: Adult Health | Admitting: Adult Health

## 2019-08-16 ENCOUNTER — Other Ambulatory Visit: Payer: Self-pay | Admitting: Adult Health

## 2019-08-16 ENCOUNTER — Other Ambulatory Visit: Payer: Self-pay

## 2019-08-16 DIAGNOSIS — Z1231 Encounter for screening mammogram for malignant neoplasm of breast: Secondary | ICD-10-CM

## 2019-08-16 DIAGNOSIS — N63 Unspecified lump in unspecified breast: Secondary | ICD-10-CM

## 2019-08-16 DIAGNOSIS — Z170 Estrogen receptor positive status [ER+]: Secondary | ICD-10-CM

## 2019-08-16 DIAGNOSIS — E2839 Other primary ovarian failure: Secondary | ICD-10-CM

## 2019-08-16 DIAGNOSIS — Z780 Asymptomatic menopausal state: Secondary | ICD-10-CM | POA: Diagnosis not present

## 2019-08-16 DIAGNOSIS — C50412 Malignant neoplasm of upper-outer quadrant of left female breast: Secondary | ICD-10-CM

## 2019-08-19 ENCOUNTER — Other Ambulatory Visit: Payer: Self-pay | Admitting: Family Medicine

## 2019-08-21 ENCOUNTER — Other Ambulatory Visit: Payer: Self-pay | Admitting: Family Medicine

## 2019-08-26 ENCOUNTER — Other Ambulatory Visit: Payer: Self-pay | Admitting: Internal Medicine

## 2019-09-03 ENCOUNTER — Ambulatory Visit
Admission: RE | Admit: 2019-09-03 | Discharge: 2019-09-03 | Disposition: A | Discharge status: Home / Self Care | Payer: Medicare HMO | Source: Ambulatory Visit | Attending: Adult Health | Admitting: Adult Health

## 2019-09-03 ENCOUNTER — Other Ambulatory Visit: Payer: Self-pay

## 2019-09-03 DIAGNOSIS — N63 Unspecified lump in unspecified breast: Secondary | ICD-10-CM

## 2019-09-03 DIAGNOSIS — N6011 Diffuse cystic mastopathy of right breast: Secondary | ICD-10-CM | POA: Diagnosis not present

## 2019-09-03 DIAGNOSIS — R928 Other abnormal and inconclusive findings on diagnostic imaging of breast: Secondary | ICD-10-CM | POA: Diagnosis not present

## 2019-09-04 ENCOUNTER — Other Ambulatory Visit: Payer: Self-pay | Admitting: *Deleted

## 2019-09-04 MED ORDER — ACCU-CHEK GUIDE VI STRP: ORAL_STRIP | 3 refills | Status: DC

## 2019-09-13 ENCOUNTER — Other Ambulatory Visit: Payer: Self-pay | Admitting: Family Medicine

## 2019-09-16 MED ORDER — ACCU-CHEK GUIDE VI STRP: ORAL_STRIP | 3 refills | Status: DC

## 2019-09-17 ENCOUNTER — Telehealth: Payer: Self-pay | Admitting: Family Medicine

## 2019-09-17 MED ORDER — EZETIMIBE 10 MG PO TABS: 10.0000 mg | ORAL_TABLET | Freq: Every day | ORAL | 0 refills | Status: DC

## 2019-09-17 NOTE — Telephone Encounter (Signed)
Patient says she needs a refill on her ezetimibe  [facility]

## 2019-09-24 ENCOUNTER — Encounter: Payer: Self-pay | Admitting: Family Medicine

## 2019-09-24 ENCOUNTER — Ambulatory Visit (INDEPENDENT_AMBULATORY_CARE_PROVIDER_SITE_OTHER): Payer: Medicare HMO | Admitting: Family Medicine

## 2019-09-24 ENCOUNTER — Other Ambulatory Visit: Payer: Self-pay

## 2019-09-24 VITALS — BP 122/68 | HR 86 | Temp 98.7°F | Resp 16 | Ht 62.0 in | Wt 202.0 lb

## 2019-09-24 DIAGNOSIS — M8949 Other hypertrophic osteoarthropathy, multiple sites: Secondary | ICD-10-CM

## 2019-09-24 DIAGNOSIS — I2510 Atherosclerotic heart disease of native coronary artery without angina pectoris: Secondary | ICD-10-CM

## 2019-09-24 DIAGNOSIS — E559 Vitamin D deficiency, unspecified: Secondary | ICD-10-CM | POA: Diagnosis not present

## 2019-09-24 DIAGNOSIS — I10 Essential (primary) hypertension: Secondary | ICD-10-CM

## 2019-09-24 DIAGNOSIS — R2681 Unsteadiness on feet: Secondary | ICD-10-CM | POA: Diagnosis not present

## 2019-09-24 DIAGNOSIS — E1365 Other specified diabetes mellitus with hyperglycemia: Secondary | ICD-10-CM | POA: Diagnosis not present

## 2019-09-24 DIAGNOSIS — IMO0002 Reserved for concepts with insufficient information to code with codable children: Secondary | ICD-10-CM

## 2019-09-24 DIAGNOSIS — M159 Polyosteoarthritis, unspecified: Secondary | ICD-10-CM

## 2019-09-24 DIAGNOSIS — Z6836 Body mass index (BMI) 36.0-36.9, adult: Secondary | ICD-10-CM

## 2019-09-24 DIAGNOSIS — E1143 Type 2 diabetes mellitus with diabetic autonomic (poly)neuropathy: Secondary | ICD-10-CM | POA: Diagnosis not present

## 2019-09-24 NOTE — Progress Notes (Signed)
Subjective:    Patient ID: Kristen [name], female    DOB: [birth year], 74 y.o.   MRN: [id number]  Patient presents for Follow-up (is fasting)   She has had recurrent falls,  Difficulty walking more than usual. Just gets weak. Often requires scooter in stores or just has to sit down She needs some help with ADL's in home such as cooking and cleaning . Family request motorized scooter to help with function     Yesterday walked out to the trash can and fell, states her back started hurting and her legs just gave out, like they usually do     Mammogram UTD    Dm- uncontrolled, CBG 164 this morining , last A1C in may 2020  8.9%, Tresiba  60 units, Novlog 10 units with meals , no hypoglycemia symptoms   Meds reviewed    Review Of Systems:  GEN- denies fatigue, fever, weight loss,weakness, recent illness HEENT- denies eye drainage, change in vision, nasal discharge, CVS- denies chest pain, palpitations RESP- denies SOB, cough, wheeze ABD- denies N/V, change in stools, abd pain GU- denies dysuria, hematuria, dribbling, incontinence MSK- + joint pain, muscle aches, injury Neuro- denies headache, dizziness, syncope, seizure activity       Objective:    BP 122/68   Pulse 86   Temp 98.7 F (37.1 C) (Temporal)   Resp 16   Ht 5\' 2"  (1.575 m)   Wt 202 lb (91.6 kg)   SpO2 98%   BMI 36.95 kg/m  GEN- NAD, alert and oriented x3 HEENT- PERRL, EOMI, non injected sclera, pink conjunctiva, MMM, oropharynx clear Neck- Supple, no thyromegaly CVS- RRR, no murmur RESP-CTAB ABD-NABS,soft,NT,ND MSK decreased ROM spine,Spine NT, decreased ROM hips, knees, walks with assistance , no knee effusions Decreased ROM upper ext Neuro- normal tone LE, decresed monofilament in bilat feet  fair grasp bilat, strength 4+/5 Upper and lower ext  EXT- No edema Pulses- Radial, DP- 2+        Assessment & Plan:      Problem List Items Addressed This Visit      Unprioritized   CAD (coronary artery  disease)   Relevant Orders   Lipid panel (Completed)   TSH (Completed)   Diabetic neuropathy (HCC)    Controlled on gabapentin Also contribute to gait issues       DM (diabetes mellitus), secondary uncontrolled (HCC)    Uncontrolled goal A1C less than 8% Continue Tresiba, MTF, Novolog      Relevant Orders   Hemoglobin A1c (Completed)   Lipid panel (Completed)   Microalbumin / creatinine urine ratio   Essential hypertension - Primary    Blood pressure controlled, no changes in meds      Relevant Orders   CBC with Differential/Platelet (Completed)   Comprehensive metabolic panel (Completed)   Gait instability    Pt unable to use manual wheelchair as unable to self propel herself She is able to use tiller system on power scooter Cane/ walker are not sufficient for any distance walking due to recurrent falls.  Pt home is able to accomodate power scooter. Scooter will allow pt to be more independent with ADL's  She is willing and able to use power scooter        Obesity   Osteoarthritis    Known OA, chronic back, knee pain       Vitamin D deficiency   Relevant Orders   Vitamin D, 25-hydroxy (Completed)      Note: This dictation was prepared  with Dragon dictation along with smaller phrase technology. Any transcriptional errors that result from this process are unintentional.

## 2019-09-24 NOTE — Patient Instructions (Addendum)
Call her insurance about the motorized scooter  F/u 4 months

## 2019-09-25 ENCOUNTER — Ambulatory Visit (INDEPENDENT_AMBULATORY_CARE_PROVIDER_SITE_OTHER): Payer: Medicare HMO | Admitting: *Deleted

## 2019-09-25 ENCOUNTER — Encounter: Payer: Self-pay | Admitting: Family Medicine

## 2019-09-25 DIAGNOSIS — I4510 Unspecified right bundle-branch block: Secondary | ICD-10-CM

## 2019-09-25 DIAGNOSIS — R2681 Unsteadiness on feet: Secondary | ICD-10-CM | POA: Insufficient documentation

## 2019-09-25 LAB — CUP PACEART REMOTE DEVICE CHECK
Date Time Interrogation Session: 20210414063205
Implantable Lead Implant Date: 20180709
Implantable Lead Implant Date: 20180709
Implantable Lead Location: 753859
Implantable Lead Location: 753860
Implantable Lead Model: 5076
Implantable Lead Model: 5076
Implantable Pulse Generator Implant Date: 20180709
Pulse Gen Model: 407145
Pulse Gen Serial Number: 69106657

## 2019-09-25 LAB — COMPREHENSIVE METABOLIC PANEL
AG Ratio: 1.4 (calc) (ref 1.0–2.5)
ALT: 21 U/L (ref 6–29)
AST: 25 U/L (ref 10–35)
Albumin: 4.1 g/dL (ref 3.6–5.1)
Alkaline phosphatase (APISO): 83 U/L (ref 37–153)
BUN/Creatinine Ratio: 20 (calc) (ref 6–22)
BUN: 19 mg/dL (ref 7–25)
CO2: 27 mmol/L (ref 20–32)
Calcium: 9.3 mg/dL (ref 8.6–10.4)
Chloride: 102 mmol/L (ref 98–110)
Creat: 0.97 mg/dL — ABNORMAL HIGH (ref 0.60–0.93)
Globulin: 3 g/dL (calc) (ref 1.9–3.7)
Glucose, Bld: 205 mg/dL — ABNORMAL HIGH (ref 65–99)
Potassium: 4.8 mmol/L (ref 3.5–5.3)
Sodium: 142 mmol/L (ref 135–146)
Total Bilirubin: 0.4 mg/dL (ref 0.2–1.2)
Total Protein: 7.1 g/dL (ref 6.1–8.1)

## 2019-09-25 LAB — CBC WITH DIFFERENTIAL/PLATELET
Absolute Monocytes: 604 cells/uL (ref 200–950)
Basophils Absolute: 31 cells/uL (ref 0–200)
Basophils Relative: 0.5 %
Eosinophils Absolute: 207 cells/uL (ref 15–500)
Eosinophils Relative: 3.4 %
HCT: 43.2 % (ref 35.0–45.0)
Hemoglobin: 13.7 g/dL (ref 11.7–15.5)
Lymphs Abs: 2019 cells/uL (ref 850–3900)
MCH: 29.3 pg (ref 27.0–33.0)
MCHC: 31.7 g/dL — ABNORMAL LOW (ref 32.0–36.0)
MCV: 92.3 fL (ref 80.0–100.0)
MPV: 10.1 fL (ref 7.5–12.5)
Monocytes Relative: 9.9 %
Neutro Abs: 3239 cells/uL (ref 1500–7800)
Neutrophils Relative %: 53.1 %
Platelets: 279 10*3/uL (ref 140–400)
RBC: 4.68 10*6/uL (ref 3.80–5.10)
RDW: 14.3 % (ref 11.0–15.0)
Total Lymphocyte: 33.1 %
WBC: 6.1 10*3/uL (ref 3.8–10.8)

## 2019-09-25 LAB — LIPID PANEL
Cholesterol: 134 mg/dL (ref <200)
HDL: 36 mg/dL — ABNORMAL LOW (ref >=50)
LDL Cholesterol (Calc): 71 mg/dL (calc)
Non-HDL Cholesterol (Calc): 98 mg/dL (calc) (ref <130)
Total CHOL/HDL Ratio: 3.7 (calc) (ref <5.0)
Triglycerides: 197 mg/dL — ABNORMAL HIGH (ref <150)

## 2019-09-25 LAB — HEMOGLOBIN A1C
Hgb A1c MFr Bld: 9.7 % of total Hgb — ABNORMAL HIGH (ref <5.7)
Mean Plasma Glucose: 232 (calc)
eAG (mmol/L): 12.8 (calc)

## 2019-09-25 LAB — TSH: TSH: 1.24 mIU/L (ref 0.40–4.50)

## 2019-09-25 LAB — VITAMIN D 25 HYDROXY (VIT D DEFICIENCY, FRACTURES): Vit D, 25-Hydroxy: 17 ng/mL — ABNORMAL LOW (ref 30–100)

## 2019-09-25 NOTE — Assessment & Plan Note (Signed)
Controlled on gabapentin Also contribute to gait issues

## 2019-09-25 NOTE — Progress Notes (Signed)
PPM Remote  

## 2019-09-25 NOTE — Assessment & Plan Note (Signed)
Known OA, chronic back, knee pain

## 2019-09-25 NOTE — Assessment & Plan Note (Signed)
Uncontrolled goal A1C less than 8% Continue Tyler Aas, MTF, Novolog

## 2019-09-25 NOTE — Assessment & Plan Note (Signed)
Pt unable to use manual wheelchair as unable to self propel herself She is able to use tiller system on power scooter Cane/ walker are not sufficient for any distance walking due to recurrent falls.  Pt home is able to accomodate power scooter. Scooter will allow pt to be more independent with ADL's  She is willing and able to use power scooter      

## 2019-09-25 NOTE — Assessment & Plan Note (Signed)
Blood pressure controlled, no changes in meds

## 2019-09-26 ENCOUNTER — Other Ambulatory Visit: Payer: Self-pay | Admitting: *Deleted

## 2019-09-26 MED ORDER — VITAMIN D (ERGOCALCIFEROL) 1.25 MG (50000 UNIT) PO CAPS: 50000.0000 [IU] | ORAL_CAPSULE | ORAL | 0 refills | Status: DC

## 2019-09-26 MED ORDER — TRESIBA FLEXTOUCH 100 UNIT/ML ~~LOC~~ SOPN: PEN_INJECTOR | SUBCUTANEOUS | 3 refills | Status: DC

## 2019-09-27 ENCOUNTER — Other Ambulatory Visit: Payer: Self-pay | Admitting: Family Medicine

## 2019-09-30 NOTE — Progress Notes (Signed)
Virtual Visit via Telephone Note   This visit type was conducted due to national recommendations for restrictions regarding the COVID-19 Pandemic (e.g. social distancing) in an effort to limit this patient's exposure and mitigate transmission in our community.  Due to her co-morbid illnesses, this patient is at least at moderate risk for complications without adequate follow up.  This format is felt to be most appropriate for this patient at this time.  The patient did not have access to video technology/had technical difficulties with video requiring transitioning to audio format only (telephone).  All issues noted in this document were discussed and addressed.  No physical exam could be performed with this format.  Please refer to the patient's chart for her  consent to telehealth for [facility].   The patient was identified using 2 identifiers.  Date:  10/01/2019   ID:  Kristen [name], DOB Oct 21, 1945, MRN [id number]  Patient Location: Home Provider Location: Office  PCP:  Alycia Rossetti, MD  Cardiologist:  Rozann Lesches, MD Electrophysiologist:  None   Evaluation Performed:  Follow-Up Visit  Chief Complaint:  Cardiac follow-up  History of Present Illness:    Kristen [name] is a 74 y.o. female last seen in October 2020.  We spoke by phone today.  She tells me that she has been doing well overall from a cardiac perspective, no angina symptoms or nitroglycerin use.  She sees Dr. Rayann Heman, Biotronik pacemaker in place.  Recent device check indicated normal function.  No dizziness or syncope.  I reviewed her recent lab work as outlined below.  LDL was 71.  She is on combination of Lipitor and Zetia.  I went over the remainder of her cardiac regimen.  No bleeding problems on aspirin and Plavix.  The patient does not have symptoms concerning for COVID-19 infection (fever, chills, cough, or new shortness of breath).  States that she is hesitant to get the vaccine.   Past Medical  History:  Diagnosis Date  . Anemia   . Anxiety   . Arthritis   . Bifascicular block   . Blood transfusion without reported diagnosis   . CAD (coronary artery disease)    DES to LAD 02/2019  . Depression   . Diabetic neuropathy ([place])   . Essential hypertension   . GERD (gastroesophageal reflux disease)   . Hemorrhoids   . History of stroke    a. MRI 12/2016 - remote left cerebellar infarcts incidentally noted.  . Hyperlipidemia   . Malignant neoplasm of upper-outer quadrant of left female breast ([place]) 11/30/2012  . Nephrolithiasis   . Obesity   . Overactive bladder   . Personal history of colonic polyps    09/2010 - 2 diminutive adenomas 02/12/2016 5 mm descending polyp - prolapse type polyp (not precancerous) no recall needed given hx, findings and age   . S/P placement of cardiac pacemaker 01/03/2017  . Type 2 diabetes mellitus ([place])   . Vitamin D deficiency    Past Surgical History:  Procedure Laterality Date  . APPENDECTOMY    . BACK SURGERY    . BREAST SURGERY    . CARDIAC CATHETERIZATION  01/2009   cath by Dr Lia Foyer revealed nonobstructive CAD  . CESAREAN SECTION    . CHOLECYSTECTOMY    . CHOLECYSTECTOMY, LAPAROSCOPIC    . COLONOSCOPY     last 2012- with polyps  . CORONARY STENT INTERVENTION N/A 02/26/2019   Procedure: CORONARY STENT INTERVENTION;  Surgeon: Jettie Booze, MD;  Location: [place]  [facility];  Service: Cardiovascular;  Laterality: N/A;  . INTRAVASCULAR PRESSURE WIRE/FFR STUDY N/A 02/26/2019   Procedure: INTRAVASCULAR PRESSURE WIRE/FFR STUDY;  Surgeon: Jettie Booze, MD;  Location: [place] [facility];  Service: Cardiovascular;  Laterality: N/A;  . JOINT REPLACEMENT    . LEFT HEART CATH AND CORONARY ANGIOGRAPHY N/A 02/26/2019   Procedure: LEFT HEART CATH AND CORONARY ANGIOGRAPHY;  Surgeon: Jettie Booze, MD;  Location: [place] [facility];  Service: Cardiovascular;  Laterality: N/A;  . LITHOTRIPSY    . MASTECTOMY Left 2014  . MASTECTOMY W/  SENTINEL NODE BIOPSY Left 12/24/2012   Procedure: LEFT MASTECTOMY WITH LEFT SENTINEL LYMPH NODE BIOPSY;  Surgeon: Rolm Bookbinder, MD;  Location: [place];  Service: General;  Laterality: Left;  . OOPHORECTOMY Right    1.5  ovaries rem  . PACEMAKER IMPLANT N/A 12/19/2016   Procedure: Pacemaker Implant;  Surgeon: Deboraha Sprang, MD;  Location: [place] [facility];  Service: Cardiovascular;  Laterality: N/A;  . PARTIAL HIP ARTHROPLASTY     left hip  . POLYPECTOMY    . REPLACEMENT TOTAL KNEE     both knees  . TONSILLECTOMY    . TOTAL MASTECTOMY Left 12/24/2012   Dr Donne Hazel     Current Meds  Medication Sig  . Accu-Chek Softclix Lancets lancets TEST BLOOD SUGAR THREE TIMES DAILY, NEED APPT  . amLODipine (NORVASC) 5 MG tablet TAKE 1 TABLET EVERY DAY  . aspirin EC 81 MG tablet Take 81 mg by mouth daily.  Marland Kitchen atorvastatin (LIPITOR) 40 MG tablet TAKE 1 TABLET AT BEDTIME  . clopidogrel (PLAVIX) 75 MG tablet Take 1 tablet (75 mg total) by mouth daily with breakfast.  . ezetimibe (ZETIA) 10 MG tablet Take 1 tablet (10 mg total) by mouth daily.  Marland Kitchen FLUoxetine (PROZAC) 40 MG capsule Take 1 capsule (40 mg total) by mouth daily.  . fluticasone (FLONASE) 50 MCG/ACT nasal spray USE 2 SPRAYS IN EACH NOSTRIL EVERY DAY AS NEEDED (SINUS ISSUES) (NEED TO CALL THE CLINIC TO SCHEDULE AN APPT FOR REFILLS)     . gabapentin (NEURONTIN) 300 MG capsule TAKE ONE CAPSULE BY MOUTH 2 TIMES A DAY (Patient taking differently: Take 300 mg by mouth 2 (two) times daily. TAKE ONE CAPSULE BY MOUTH 2 TIMES A DAY)  . glucose blood (ACCU-CHEK GUIDE) test strip Use as instructed to monitor FSBS 3x daily. Dx: E11.41.  Marland Kitchen Insulin Pen Needle (UNIFINE PENTIPS) 32G X 4 MM MISC USE TO INJECT TRESIBA AND NOVOLOG EVERY DAY AS DIRECTED  . isosorbide mononitrate (IMDUR) 30 MG 24 hr tablet TAKE 1 TABLET (30 MG TOTAL) BY MOUTH DAILY.  . metFORMIN (GLUCOPHAGE) 1000 MG tablet TAKE 1 TABLET TWICE DAILY WITH MEALS (Patient taking differently: Take  1,000 mg by mouth 2 (two) times daily. )  . nitroGLYCERIN (NITROSTAT) 0.4 MG SL tablet Place 1 tablet (0.4 mg total) under the tongue every 5 (five) minutes as needed.  Marland Kitchen NOVOLOG FLEXPEN 100 UNIT/ML FlexPen INJECT 8 UNITS INTO THE SKIN THREE TIMES DAILY WITH MEALS   . oxybutynin (DITROPAN-XL) 5 MG 24 hr tablet TAKE 1 TABLET AT BEDTIME  . pantoprazole (PROTONIX) 40 MG tablet Take 1 tablet (40 mg total) by mouth daily at 6 (six) AM.  . TRESIBA FLEXTOUCH 100 UNIT/ML FlexTouch Pen INJECT 62 UNITS INTO THE SKIN AT BEDTIME  . Vitamin D, Ergocalciferol, (DRISDOL) 1.25 MG (50000 UNIT) CAPS capsule Take 1 capsule (50,000 Units total) by mouth every 7 (seven) days. x12 weeks, then D/C  Allergies:   Sulfonamide derivatives   ROS:   Recent mechanical fall, states that knee "buckled." No syncope.  Prior CV studies:   The following studies were reviewed today:  Cardiac catheterization 02/26/2019:  Mid LAD lesion is 90% stenosed.  A drug-eluting stent was successfully placed using a STENT SYNERGY DES 2.75X24.  Post intervention, there is a 0% residual stenosis.  Prox RCA-1 lesion is 50% stenosed. Nonsignificant by DFR/FFR.  Prox RCA-2 lesion is 25% stenosed.  Mid Cx lesion is 25% stenosed.  The left ventricular systolic function is normal.  LV end diastolic pressure is normal.  The left ventricular ejection fraction is 55-65% by visual estimate.  There is no aortic valve stenosis.  Contiue dual antiplatelet therapy and aggressive secondary prevention.   Echocardiogram 12/13/2016: Study Conclusions  - Left ventricle: The cavity size was normal. Wall thickness was increased in a pattern of moderate LVH. Systolic function was normal. The estimated ejection fraction was in the range of 55% to 60%. Normal GLPSS at -21%. Wall motion was normal; there were no regional wall motion abnormalities. Doppler parameters are consistent with abnormal left ventricular relaxation (grade  1 diastolic dysfunction). The E/e&' ratio is between 8-15, suggesting indeterminate LV filling pressure. - Aortic valve: Trileaflet. Sclerosis without stenosis. There was no regurgitation. - Tricuspid valve: There was mild regurgitation. - Pulmonary arteries: PA peak pressure: 34 mm Hg (S). - Inferior vena cava: The vessel was normal in size. The respirophasic diameter changes were in the normal range (>= 50%), consistent with normal central venous pressure.  Impressions:  - Compared to a prior echo in 2016, there are no significant changes.  Labs/Other Tests and Data Reviewed:    EKG:  An ECG dated 01/27/2019 was personally reviewed today and demonstrated:  Dual-chamber paced rhythm.  Recent Labs: 09/24/2019: ALT 21; BUN 19; Creat 0.97; Hemoglobin 13.7; Platelets 279; Potassium 4.8; Sodium 142; TSH 1.24   Recent Lipid Panel Lab Results  Component Value Date/Time   CHOL 134 09/24/2019 02:02 PM   TRIG 197 (H) 09/24/2019 02:02 PM   HDL 36 (L) 09/24/2019 02:02 PM   CHOLHDL 3.7 09/24/2019 02:02 PM   LDLCALC 71 09/24/2019 02:02 PM    Wt Readings from Last 3 Encounters:  10/01/19 202 lb (91.6 kg)  09/24/19 202 lb (91.6 kg)  04/30/19 206 lb 11.2 oz (93.8 kg)     Objective:    Vital Signs:  Ht 5\' 2"  (1.575 m)   Wt 202 lb (91.6 kg)   BMI 36.95 kg/m    Patient spoke in full sentences, not short of breath. No wheezing or coughing.  ASSESSMENT & PLAN:    1.  CAD status post DES to the mid LAD in September 2020.  She does not report any active angina on medical therapy and is doing well on dual antiplatelet regimen.  Continue aspirin, Plavix, Lipitor, Zetia, Norvasc, and Imdur.  2.  Mixed hyperlipidemia, recent LDL 71 on Lipitor and Zetia.  3.  History of symptomatic second-degree heart block and syncope status post Biotronik pacemaker.  She continues to follow with Dr. Rayann Heman.  COVID-19 Education: The signs and symptoms of COVID-19 were discussed with the  patient and how to seek care for testing (follow up with PCP or arrange E-visit).  The importance of social distancing was discussed today.  Time:   Today, I have spent 5 minutes with the patient with telehealth technology discussing the above problems.     Medication Adjustments/Labs and Tests Ordered: Current medicines  are reviewed at length with the patient today.  Concerns regarding medicines are outlined above.   Tests Ordered: No orders of the defined types were placed in this encounter.   Medication Changes: No orders of the defined types were placed in this encounter.   Follow Up:  In Person 6 months in the [place] office.  Signed, Rozann Lesches, MD  10/01/2019 9:31 AM    [place]

## 2019-10-01 ENCOUNTER — Telehealth (INDEPENDENT_AMBULATORY_CARE_PROVIDER_SITE_OTHER): Payer: Medicare HMO | Admitting: Cardiology

## 2019-10-01 ENCOUNTER — Encounter: Payer: Self-pay | Admitting: Cardiology

## 2019-10-01 VITALS — Ht 62.0 in | Wt 202.0 lb

## 2019-10-01 DIAGNOSIS — E782 Mixed hyperlipidemia: Secondary | ICD-10-CM

## 2019-10-01 DIAGNOSIS — I25119 Atherosclerotic heart disease of native coronary artery with unspecified angina pectoris: Secondary | ICD-10-CM

## 2019-10-01 DIAGNOSIS — Z950 Presence of cardiac pacemaker: Secondary | ICD-10-CM | POA: Diagnosis not present

## 2019-10-01 NOTE — Patient Instructions (Addendum)

## 2019-10-03 ENCOUNTER — Other Ambulatory Visit: Payer: Self-pay | Admitting: *Deleted

## 2019-10-03 MED ORDER — EZETIMIBE 10 MG PO TABS: 10.0000 mg | ORAL_TABLET | Freq: Every day | ORAL | 3 refills | Status: DC

## 2019-10-08 ENCOUNTER — Other Ambulatory Visit: Payer: Self-pay | Admitting: Family Medicine

## 2019-10-14 ENCOUNTER — Other Ambulatory Visit: Payer: Self-pay | Admitting: *Deleted

## 2019-10-14 ENCOUNTER — Telehealth: Payer: Self-pay | Admitting: *Deleted

## 2019-10-14 MED ORDER — EZETIMIBE 10 MG PO TABS: 10.0000 mg | ORAL_TABLET | Freq: Every day | ORAL | 3 refills | Status: DC

## 2019-10-14 NOTE — Telephone Encounter (Signed)
Order written, pt already had face to face

## 2019-10-14 NOTE — Telephone Encounter (Signed)
Received call from patient.   Requested to have order for motorized scooter sent to [facility].   MD to be made aware.

## 2019-10-23 ENCOUNTER — Other Ambulatory Visit: Payer: Self-pay | Admitting: Family Medicine

## 2019-12-02 ENCOUNTER — Telehealth: Payer: Self-pay | Admitting: Family Medicine

## 2019-12-02 NOTE — Progress Notes (Signed)
  Chronic Care Management   Outreach Note  12/02/2019 Name: Kristen [name] MRN: [id number] DOB: 1946/04/16  Referred by: Alycia Rossetti, MD Reason for referral : No chief complaint on file.   An unsuccessful telephone outreach was attempted today. The patient was referred to the pharmacist for assistance with care management and care coordination.   Follow Up Plan:  [place]

## 2019-12-09 ENCOUNTER — Telehealth: Payer: Self-pay | Admitting: Family Medicine

## 2019-12-09 NOTE — Progress Notes (Signed)
  Chronic Care Management   Outreach Note  12/09/2019 Name: Kristen [name] MRN: [id number] DOB: 1945/11/19  Referred by: Alycia Rossetti, MD Reason for referral : No chief complaint on file.   A second unsuccessful telephone outreach was attempted today. The patient was referred to pharmacist for assistance with care management and care coordination.  Follow Up Plan:   [place]

## 2019-12-23 ENCOUNTER — Telehealth: Payer: Self-pay | Admitting: Family Medicine

## 2019-12-23 NOTE — Progress Notes (Signed)
  Chronic Care Management   Note  12/23/2019 Name: Bay Jarquin MRN: [id number] DOB: 13-Apr-1946  Willer Osorno is a 74 y.o. year old female who is a primary care patient of [place], Modena Nunnery, MD. I reached out to [facility] by phone today in response to a referral sent by Ms. Francie Wenzler's PCP, Buelah Manis, Modena Nunnery, MD.   Ms. Pancoast was given information about Chronic Care Management services today including:  1. CCM service includes personalized support from designated clinical staff supervised by her physician, including individualized plan of care and coordination with other care providers 2. 24/7 contact phone numbers for assistance for urgent and routine care needs. 3. Service will only be billed when office clinical staff spend 20 minutes or more in a month to coordinate care. 4. Only one practitioner may furnish and bill the service in a calendar month. 5. The patient may stop CCM services at any time (effective at the end of the month) by phone call to the office staff.   Patient agreed to services and verbal consent obtained.   Follow up plan:  [place]

## 2019-12-24 LAB — CUP PACEART REMOTE DEVICE CHECK
Date Time Interrogation Session: 20210713094424
Implantable Lead Implant Date: 20180709
Implantable Lead Implant Date: 20180709
Implantable Lead Location: 753859
Implantable Lead Location: 753860
Implantable Lead Model: 5076
Implantable Lead Model: 5076
Implantable Pulse Generator Implant Date: 20180709
Pulse Gen Model: 407145
Pulse Gen Serial Number: 69106657

## 2019-12-25 ENCOUNTER — Ambulatory Visit (INDEPENDENT_AMBULATORY_CARE_PROVIDER_SITE_OTHER): Payer: Medicare HMO | Admitting: *Deleted

## 2019-12-25 DIAGNOSIS — R55 Syncope and collapse: Secondary | ICD-10-CM

## 2019-12-26 NOTE — Progress Notes (Signed)
Remote pacemaker transmission.   

## 2020-01-07 ENCOUNTER — Other Ambulatory Visit: Payer: Self-pay | Admitting: *Deleted

## 2020-01-07 ENCOUNTER — Encounter: Payer: Self-pay | Admitting: Family Medicine

## 2020-01-07 ENCOUNTER — Other Ambulatory Visit: Payer: Self-pay

## 2020-01-07 ENCOUNTER — Ambulatory Visit (INDEPENDENT_AMBULATORY_CARE_PROVIDER_SITE_OTHER): Payer: Medicare HMO | Admitting: Family Medicine

## 2020-01-07 VITALS — BP 128/70 | HR 82 | Temp 98.2°F | Resp 14 | Ht 62.0 in | Wt 212.0 lb

## 2020-01-07 DIAGNOSIS — M8949 Other hypertrophic osteoarthropathy, multiple sites: Secondary | ICD-10-CM | POA: Diagnosis not present

## 2020-01-07 DIAGNOSIS — E559 Vitamin D deficiency, unspecified: Secondary | ICD-10-CM

## 2020-01-07 DIAGNOSIS — E1149 Type 2 diabetes mellitus with other diabetic neurological complication: Secondary | ICD-10-CM

## 2020-01-07 DIAGNOSIS — G43909 Migraine, unspecified, not intractable, without status migrainosus: Secondary | ICD-10-CM | POA: Insufficient documentation

## 2020-01-07 DIAGNOSIS — E782 Mixed hyperlipidemia: Secondary | ICD-10-CM

## 2020-01-07 DIAGNOSIS — E1143 Type 2 diabetes mellitus with diabetic autonomic (poly)neuropathy: Secondary | ICD-10-CM

## 2020-01-07 DIAGNOSIS — G43809 Other migraine, not intractable, without status migrainosus: Secondary | ICD-10-CM | POA: Diagnosis not present

## 2020-01-07 DIAGNOSIS — E66812 Obesity, class 2: Secondary | ICD-10-CM

## 2020-01-07 DIAGNOSIS — I2510 Atherosclerotic heart disease of native coronary artery without angina pectoris: Secondary | ICD-10-CM

## 2020-01-07 DIAGNOSIS — I10 Essential (primary) hypertension: Secondary | ICD-10-CM

## 2020-01-07 DIAGNOSIS — M159 Polyosteoarthritis, unspecified: Secondary | ICD-10-CM

## 2020-01-07 DIAGNOSIS — R2681 Unsteadiness on feet: Secondary | ICD-10-CM

## 2020-01-07 DIAGNOSIS — M150 Primary generalized (osteo)arthritis: Secondary | ICD-10-CM

## 2020-01-07 MED ORDER — VITAMIN D 25 MCG (1000 UNIT) PO TABS: 1000.0000 [IU] | ORAL_TABLET | Freq: Every day | ORAL | Status: AC

## 2020-01-07 NOTE — Assessment & Plan Note (Signed)
Contributes to falls as well

## 2020-01-07 NOTE — Assessment & Plan Note (Signed)
continu statin drug, plavix

## 2020-01-07 NOTE — Assessment & Plan Note (Signed)
Pt unable to use manual wheelchair as unable to self propel herself She is able to use tiller system on power scooter Cane/ walker are not sufficient for any distance walking due to recurrent falls.  Pt home is able to accomodate power scooter. Scooter will allow pt to be more independent with ADL's  She is willing and able to use power scooter

## 2020-01-07 NOTE — Assessment & Plan Note (Signed)
Only 1 severe HA not responsive to tylenol Check labs, increase hydration  Will monitor for now and see how they progress Prior to this past week, she had not had HA in months

## 2020-01-07 NOTE — Chronic Care Management (AMB) (Signed)
Chronic Care Management Pharmacy  Name: Kristen [name]  MRN: [id number] DOB: Nov 01, 1945  Chief Complaint/ HPI  [facility],  74 y.o. , female presents for their Initial CCM visit with the clinical pharmacist via telephone.  PCP : Alycia Rossetti, MD  Their chronic conditions include: hypertension, CAD,  Type II DM, hyperlipidemia, depression/anxiety, vitamin D deficiency.  Office Visits: 01/07/2020 [facility]) - mobility exam  Recurrent falls, difficulty walking  Required scooter in stores and some help with ADL's  09/24/2019 [facility]) - follow-up  Does need help with ADL's cooking and cleaning  Diabetes remains uncontrolled Consult Visit:  10/01/2019 ([facility], Cardio) - no changes made  Continue ASA, Plavix, Lipitor, Zetia, Norvasc, and Imdur.  Medications: Outpatient Encounter Medications as of 01/08/2020  Medication Sig  . Accu-Chek Softclix Lancets lancets TEST BLOOD SUGAR THREE TIMES DAILY, NEED APPT  . amLODipine (NORVASC) 5 MG tablet TAKE 1 TABLET EVERY DAY  . aspirin EC 81 MG tablet Take 81 mg by mouth daily.  Marland Kitchen atorvastatin (LIPITOR) 40 MG tablet TAKE 1 TABLET AT BEDTIME  . cholecalciferol (VITAMIN D3) 25 MCG (1000 UNIT) tablet Take 1 tablet (1,000 Units total) by mouth daily.  . clopidogrel (PLAVIX) 75 MG tablet Take 1 tablet (75 mg total) by mouth daily with breakfast.  . ezetimibe (ZETIA) 10 MG tablet Take 1 tablet (10 mg total) by mouth daily.  Marland Kitchen FLUoxetine (PROZAC) 40 MG capsule Take 1 capsule (40 mg total) by mouth daily.  . fluticasone (FLONASE) 50 MCG/ACT nasal spray USE 2 SPRAYS IN EACH NOSTRIL EVERY DAY AS NEEDED (SINUS ISSUES) (NEED TO CALL THE CLINIC TO SCHEDULE AN APPT FOR REFILLS)     . gabapentin (NEURONTIN) 300 MG capsule TAKE ONE CAPSULE BY MOUTH 2 TIMES A DAY (Patient taking differently: Take 300 mg by mouth 2 (two) times daily. TAKE ONE CAPSULE BY MOUTH 2 TIMES A DAY)  . glucose blood (ACCU-CHEK GUIDE) test strip Use as instructed to monitor  FSBS 3x daily. Dx: E11.41.  Marland Kitchen Insulin Pen Needle (UNIFINE PENTIPS) 32G X 4 MM MISC USE TO INJECT TRESIBA AND NOVOLOG EVERY DAY AS DIRECTED  . isosorbide mononitrate (IMDUR) 30 MG 24 hr tablet TAKE 1 TABLET (30 MG TOTAL) BY MOUTH DAILY.  . metFORMIN (GLUCOPHAGE) 1000 MG tablet TAKE 1 TABLET TWICE DAILY WITH MEALS  . nitroGLYCERIN (NITROSTAT) 0.4 MG SL tablet Place 1 tablet (0.4 mg total) under the tongue every 5 (five) minutes as needed.  Marland Kitchen NOVOLOG FLEXPEN 100 UNIT/ML FlexPen INJECT 8 UNITS INTO THE SKIN THREE TIMES DAILY WITH MEALS  (Patient taking differently: 10 Units. )  . oxybutynin (DITROPAN-XL) 5 MG 24 hr tablet TAKE 1 TABLET AT BEDTIME  . pantoprazole (PROTONIX) 40 MG tablet Take 1 tablet (40 mg total) by mouth daily at 6 (six) AM.  . TRESIBA FLEXTOUCH 100 UNIT/ML FlexTouch Pen INJECT 62 UNITS INTO THE SKIN AT BEDTIME   No facility-administered encounter medications on file as of 01/08/2020.     Current Diagnosis/Assessment:   Emergency planning/management officer Strain: Low Risk   . Difficulty of Paying Living Expenses: Not very hard    Goals Addressed            This Visit's Progress   . Pharmacy Care Plan:       CARE PLAN ENTRY (see longitudinal plan of care for additional care plan information)  Current Barriers:  . Chronic Disease Management support, education, and care coordination needs related to Hypertension, Hyperlipidemia, Diabetes, and Coronary Artery Disease   Hypertension  BP Readings from Last 3 Encounters:  01/07/20 128/70  09/24/19 122/68  04/30/19 135/69   . Pharmacist Clinical Goal(s): o Over the next 90 days, patient will work with PharmD and providers to maintain BP goal <130/80 . Current regimen:  o Amlodipine 34m daily o Isosorbide 366mdaily . Interventions: o Reviewed most recent BP readings o Reviewed home monitoring procedure . Patient self care activities - Over the next 90 days, patient will: o Check BP daily, document, and provide at future  appointments o Ensure daily salt intake < 2300 mg/day o Contact providers with consistent readings > 130/80  Hyperlipidemia Lab Results  Component Value Date/Time   LDLCALC 59 01/07/2020 12:27 PM   . Pharmacist Clinical Goal(s): o Over the next 90 days, patient will work with PharmD and providers to maintain LDL goal < 70 . Current regimen:  o Atorvastatin 4067maily o Zetia 68m17mily . Interventions: o Reviewed most recent lipid panel o Discussed dietary changes patient could make to bring down TG . Patient self care activities - Over the next 90 days, patient will: o Work on dietary changes such as limiting saturated fats and sugary foods o Continue to focus on medication adherence by pill count  Diabetes Lab Results  Component Value Date/Time   HGBA1C 9.8 (H) 01/07/2020 12:27 PM   HGBA1C 9.7 (H) 09/24/2019 02:02 PM   HGBA1C 11.3 (H) 07/22/2016 12:17 PM   HGBA1C 8.7 (H) 12/29/2014 08:44 AM   . Pharmacist Clinical Goal(s): o Over the next 90 days, patient will work with PharmD and providers to achieve A1c goal <7% . Current regimen:  o Tresiba 62 units hs o Metformin 1000mg32mce daily o Novolog 10 units three times per day with meals . Interventions: o Reviewed home blood sugar logs o Reviewed most recent A1c result o Discussed dietary changes such as carb counting to promote euglycemia o Provided chair exercises since patient does not like to get out in the heat . Patient self care activities - Over the next 90 days, patient will: o Check blood sugar 3-4 times daily, document, and provide at future appointments o Contact provider with any episodes of hypoglycemia o Work on hard on dietary changes provided to bring down glucose levels  CAD . Pharmacist Clinical Goal(s) o Over the next 90 days, patient will work with PharmD and providers to optimize medication and minimize symptoms related to CAD. . CurMarland Kitchenent regimen:  o ASA 81mg 74my o Plavix 75 mg  daily . Interventions: o Counseled on signs symptoms of abnormal bleeding o Counseled on risk of bleeding with falls . Patient self care activities - Over the next 90 days, patient will: o Continue to focus on medication adherence by pill count o Contact providers with any signs/symptoms of abnormal bleeding.   Initial goal documentation        Diabetes   A1c goal <7%  Recent Relevant Labs: Lab Results  Component Value Date/Time   HGBA1C 9.8 (H) 01/07/2020 12:27 PM   HGBA1C 9.7 (H) 09/24/2019 02:02 PM   HGBA1C 11.3 (H) 07/22/2016 12:17 PM   HGBA1C 8.7 (H) 12/29/2014 08:44 AM   MICROALBUR 349.2 01/07/2020 12:27 PM   MICROALBUR 47.5 10/16/2018 12:38 PM    Last diabetic Eye exam:  Lab Results  Component Value Date/Time   HMDIABEYEEXA Dr Gould Delman [place]/2012 12:00 AM    Last diabetic Foot exam: No results found for: HMDIABFOOTEX   Checking BG: 3x per Day  Recent FBG Readings: 140s-150s per patient  Recent 2hr PP BG readings:  150-230 range per patient   Patient has failed these meds in past: none noted Patient is currently uncontrolled on the following medications: . Tresiba 100u/ml 62 units hs . Metformin 1036m twice daily . Novolog 100u/ml 10 units tid  Patient most recent A1c returned today and remains elevated. Based on A1c patients blood sugars are averaging around 230.  Patients record of at home blood sugars does not match A1c.  Need for accurate reporting to be able to adjust medications.  Lots of room for improvement on diet.  Reports diet high in carbohydrates.  Counseled on limiting carbohydrates to one serving per meal and choosing brown carbs over white.  Also, UACR increased on most recent labs.  Plan Continue current medications, agree with Dr. DDorian Heckleoutlined plan of adding lisinopril 2.5 mg for proteinuria.  Recommend follow up in a few weeks to make sure there is no bump in Scr.  Have patient bring meter as requested by PCP for accurate dose adjustments.  Patient may benefit from lower basal insulin and increased bolus doses based on what pre-meal readings show.  Hyperlipidemia   LDL goal < 70  Lipid Panel     Component Value Date/Time   CHOL 123 01/07/2020 1227   TRIG 283 (H) 01/07/2020 1227   HDL 31 (L) 01/07/2020 1227   LDLCALC 59 01/07/2020 1227    Hepatic Function Latest Ref Rng & Units 01/07/2020 09/24/2019 10/16/2018  Total Protein 6.1 - 8.1 g/dL 6.7 7.1 7.1  Albumin 3.5 - 5.0 g/dL - - -  AST 10 - 35 U/L 14 25 15   ALT 6 - 29 U/L 15 21 21   Alk Phosphatase 38 - 126 U/L - - -  Total Bilirubin 0.2 - 1.2 mg/dL 0.4 0.4 0.3  Bilirubin, Direct 0.0 - 0.3 mg/dL - - -     The ASCVD Risk score (GByron, et al., 2013) failed to calculate for the following reasons:   The valid total cholesterol range is 130 to 320 mg/dL   Patient has failed these meds in past: none noted Patient is currently controlled based on LDL, TG out of range on the following medications:  . Atorvastatin 470mtablet . Zetia 1055mablet  Patient updated lipid panel came back today.  LDL is controlled, TG's are elevated.  Patient verbalizes compliance with all medications.  Really think her main issue is diet related, she verbalizes diet high in carbohydrates and admits that she loves bread.  Counseled on limiting foods high in fats and sugars.  Plan  Continue current medications, recheck lipids in 3-4 months.   Hypertension    Office blood pressures are  BP Readings from Last 3 Encounters:  01/07/20 128/70  09/24/19 122/68  04/30/19 135/69    Patient has failed these meds in the past: none noted  Patient checks BP at home daily  Patient home BP readings are ranging: no logs available, daughter takes care of blood pressure Patient is currently controlled on the following medications:   Amlodipine 5mg59mily  Isosorbide 30mg91mly  Office blood pressures normal, monitored at home by daughter.  Patient does report swelling in ankles, however, it  is mild and subsides when feet are elevated.  Plan  Continue current medications, elevate feet whenever possible.     CAD    Patient has failed these meds in past: none noted Patient is currently controlled on the following medications:   ASA 81mg 8mvix 75 mg daily  No chest pain, no abnormal bruising bleeding.  Counseled with fall risk to make sure she is being careful and any bleeding stops within a reasonable amount of time.  Plan  Continue current medications   Depression/Anxiety   Patient has failed these meds in past: none noted Patient is currently controlled on the following medications:  . Fluoxetine 55m daily  Reports good mood overall lately, sleep patterns normal.  States she is sleeping through the night with the exception of using the bathroom and waking aroun 8-8:30 am daily.  Plan  Continue current medications  Vitamin D Deficiency   01/07/2020 - Vit D - 54 (WNL)   Patient has failed these meds in past: none noted Patient is currently controlled on the following medications:  .Marland KitchenVitamin D 1000 iu daily  Patient controlled on normal OTC therapy.  Plan  Continue current medications.  Vaccines   Reviewed and discussed patient's vaccination history.    Immunization History  Administered Date(s) Administered  . Fluad Quad(high Dose 65+) 02/27/2019  . Influenza Whole 02/16/2011  . Influenza, High Dose Seasonal PF 08/15/2017  . Influenza,inj,Quad PF,6+ Mos 06/18/2014, 02/12/2015, 08/01/2016  . Pneumococcal Conjugate-13 06/18/2014  . Pneumococcal Polysaccharide-23 01/12/2000, 12/26/2012, 12/13/2016  . Unspecified SARS-COV-2 Vaccination 12/17/2019    Plan  Recommended patient receive shingles vaccine in office.  Medication Management   . Miscellaneous medications: o Fluticasone 563m o Gabapentin 30041mid o Nitrostat 0.4 SL o Oxybutynin 5mg81mily . OTC's: none additional . Patient currently [facility].  Phone #   [phone] reports using pill box method to organize medications and promote adherence. . Patient denies missed doses of medication.   ChriBeverly MilcharmD Clinical Pharmacist [phone]

## 2020-01-07 NOTE — Patient Instructions (Addendum)
F/u 4 MONTHS PHYSICAL

## 2020-01-07 NOTE — Assessment & Plan Note (Signed)
Uncontrolled, recheck A1C goal < 8% Continue MTF, NOVOLOG 10 units with meals, TRESIBA 62 units

## 2020-01-07 NOTE — Progress Notes (Signed)
Subjective:    Patient ID: Kristen [name], female    DOB: September 17, 1945, 74 y.o.   MRN: [id number]  Patient presents for Mobility Exam ([place]) and Freuent HA (temporal HA with light, sound, smell sensitivity- APAP no longer effective) Patient here to follow-up chronic medical problems.  She also needs face-to-face for motorized uterus.   She has had recurrent falls, Most recent fall a few weeks ago, she stepped off the step and knee gave out and hit the step and bruised her knee,  Difficulty walking more than usual. Just gets weak. Often requires scooter in stores or just has to sit down She needs some help with ADL's in home such as cooking and cleaning . Family request motorized scooter to help with function     Dm- uncontrolled, CBG 158 this morining , last A1C in April was  9.7% , currently on Tresiba  62 units, Novlog 10 units with meals , metformin  no hypoglycemia symptoms   Vitamin D def- completed weekly vitamin D , due for recheck on labs, currenty on   Typically drinks 24 ounces of water    [place] [phone] / [id number]  COVID-19 Vaccine #1 done- [facility]   Migraine headache- , history of migraines, states they improved, she gets random headaches, mostly in temporal region. Sometimes just sitting watching TV.  She does get nausea with it and a little swimmy headed  She oftentakes  tylenol and tries to lay down and this worked  She had a severe one last week but tylenol did not help  She does have some sinsu issues, when outisde, she uses flonase as needed    Review Of Systems:  GEN- denies fatigue, fever, weight loss,weakness, recent illness HEENT- denies eye drainage, change in vision, nasal discharge, CVS- denies chest pain, palpitations RESP- denies SOB, cough, wheeze ABD- denies N/V, change in stools, abd pain GU- denies dysuria, hematuria, dribbling, incontinence MSK- denies joint pain, muscle aches, injury Neuro- denies headache, dizziness,  syncope, seizure activity       Objective:    BP 128/70   Pulse 82   Temp 98.2 F (36.8 C) (Temporal)   Resp 14   Ht 5\' 2"  (1.575 m)   Wt (!) 212 lb (96.2 kg)   SpO2 94%   BMI 38.78 kg/m  GEN- NAD, alert and oriented x3 HEENT- PERRL, EOMI, non injected sclera, pink conjunctiva, MMM, oropharynx clear Neck- Supple, no thyromegaly CVS- RRR, no murmur RESP-CTAB ABD-NABS,soft,NT,ND MSK decreased ROM spine,Spine NT, decreased ROM hips, knees, walks with assistance , no knee effusions Decreased ROM upper ext Neuro- CNII-XII in tact no focal deficits  normal tone LE, decresed monofilament in bilat feet  fair grasp bilat, strength 4+/5 Upper and lower ext  EXT-trace ankle edema Pulses- Radial, DP- 2+       Assessment & Plan:      Problem List Items Addressed This Visit      Unprioritized   CAD (coronary artery disease)    continu statin drug, plavix      Relevant Orders   Lipid panel   Diabetic neuropathy ([place])   Essential hypertension - Primary    Controlled no changes      Gait instability     Pt unable to use manual wheelchair as unable to self propel herself She is able to use tiller system on power scooter Cane/ walker are not sufficient for any distance walking due to recurrent falls.  Pt home is able to accomodate power scooter.  Scooter will allow pt to be more independent with ADL's  She is willing and able to use power scooter           Hyperlipidemia   Relevant Orders   Lipid panel   Migraines    Only 1 severe HA not responsive to tylenol Check labs, increase hydration  Will monitor for now and see how they progress Prior to this past week, she had not had HA in months       Osteoarthritis    Contributes to falls as well       Type 2 diabetes mellitus with neurological complications (HCC)    Uncontrolled, recheck A1C goal < 8% Continue MTF, NOVOLOG 10 units with meals, TRESIBA 62 units       Relevant Orders   HM DIABETES FOOT EXAM  (Completed)   CBC with Differential/Platelet   Comprehensive metabolic panel   Hemoglobin A1c   Lipid panel   Microalbumin / creatinine urine ratio   Vitamin D deficiency   Relevant Orders   Vitamin D, 25-hydroxy      Note: This dictation was prepared with Dragon dictation along with smaller phrase technology. Any transcriptional errors that result from this process are unintentional.

## 2020-01-07 NOTE — Assessment & Plan Note (Signed)
Controlled no changes 

## 2020-01-08 ENCOUNTER — Ambulatory Visit: Payer: Medicare HMO

## 2020-01-08 DIAGNOSIS — I2510 Atherosclerotic heart disease of native coronary artery without angina pectoris: Secondary | ICD-10-CM

## 2020-01-08 DIAGNOSIS — E1149 Type 2 diabetes mellitus with other diabetic neurological complication: Secondary | ICD-10-CM

## 2020-01-08 DIAGNOSIS — I10 Essential (primary) hypertension: Secondary | ICD-10-CM

## 2020-01-08 DIAGNOSIS — E782 Mixed hyperlipidemia: Secondary | ICD-10-CM

## 2020-01-08 DIAGNOSIS — F341 Dysthymic disorder: Secondary | ICD-10-CM

## 2020-01-08 LAB — CBC WITH DIFFERENTIAL/PLATELET
Absolute Monocytes: 564 cells/uL (ref 200–950)
Basophils Absolute: 42 cells/uL (ref 0–200)
Basophils Relative: 0.7 %
Eosinophils Absolute: 246 cells/uL (ref 15–500)
Eosinophils Relative: 4.1 %
HCT: 39.8 % (ref 35.0–45.0)
Hemoglobin: 13 g/dL (ref 11.7–15.5)
Lymphs Abs: 1830 cells/uL (ref 850–3900)
MCH: 30 pg (ref 27.0–33.0)
MCHC: 32.7 g/dL (ref 32.0–36.0)
MCV: 91.9 fL (ref 80.0–100.0)
MPV: 10.2 fL (ref 7.5–12.5)
Monocytes Relative: 9.4 %
Neutro Abs: 3318 cells/uL (ref 1500–7800)
Neutrophils Relative %: 55.3 %
Platelets: 240 10*3/uL (ref 140–400)
RBC: 4.33 10*6/uL (ref 3.80–5.10)
RDW: 14.4 % (ref 11.0–15.0)
Total Lymphocyte: 30.5 %
WBC: 6 10*3/uL (ref 3.8–10.8)

## 2020-01-08 LAB — COMPREHENSIVE METABOLIC PANEL
AG Ratio: 1.5 (calc) (ref 1.0–2.5)
ALT: 15 U/L (ref 6–29)
AST: 14 U/L (ref 10–35)
Albumin: 4 g/dL (ref 3.6–5.1)
Alkaline phosphatase (APISO): 80 U/L (ref 37–153)
BUN/Creatinine Ratio: 22 (calc) (ref 6–22)
BUN: 22 mg/dL (ref 7–25)
CO2: 26 mmol/L (ref 20–32)
Calcium: 9 mg/dL (ref 8.6–10.4)
Chloride: 103 mmol/L (ref 98–110)
Creat: 1.01 mg/dL — ABNORMAL HIGH (ref 0.60–0.93)
Globulin: 2.7 g/dL (calc) (ref 1.9–3.7)
Glucose, Bld: 210 mg/dL — ABNORMAL HIGH (ref 65–99)
Potassium: 4.7 mmol/L (ref 3.5–5.3)
Sodium: 141 mmol/L (ref 135–146)
Total Bilirubin: 0.4 mg/dL (ref 0.2–1.2)
Total Protein: 6.7 g/dL (ref 6.1–8.1)

## 2020-01-08 LAB — LIPID PANEL
Cholesterol: 123 mg/dL (ref <200)
HDL: 31 mg/dL — ABNORMAL LOW (ref >=50)
LDL Cholesterol (Calc): 59 mg/dL (calc)
Non-HDL Cholesterol (Calc): 92 mg/dL (calc) (ref <130)
Total CHOL/HDL Ratio: 4 (calc) (ref <5.0)
Triglycerides: 283 mg/dL — ABNORMAL HIGH (ref <150)

## 2020-01-08 LAB — VITAMIN D 25 HYDROXY (VIT D DEFICIENCY, FRACTURES): Vit D, 25-Hydroxy: 54 ng/mL (ref 30–100)

## 2020-01-08 LAB — MICROALBUMIN / CREATININE URINE RATIO
Creatinine, Urine: 155 mg/dL (ref 20–275)
Microalb Creat Ratio: 2253 mcg/mg creat — ABNORMAL HIGH (ref <30)
Microalb, Ur: 349.2 mg/dL

## 2020-01-08 LAB — HEMOGLOBIN A1C
Hgb A1c MFr Bld: 9.8 % of total Hgb — ABNORMAL HIGH (ref <5.7)
Mean Plasma Glucose: 235 (calc)
eAG (mmol/L): 13 (calc)

## 2020-01-08 NOTE — Patient Instructions (Addendum)
Visit Information Thank you for meeting with me today!  I look forward to working with you to help you meet all of your healthcare goals and answer any questions you may have.  Feel free to contact me anytime!  Goals Addressed            This Visit's Progress   . Pharmacy Care Plan:       CARE PLAN ENTRY (see longitudinal plan of care for additional care plan information)  Current Barriers:  . Chronic Disease Management support, education, and care coordination needs related to Hypertension, Hyperlipidemia, Diabetes, and Coronary Artery Disease   Hypertension BP Readings from Last 3 Encounters:  01/07/20 128/70  09/24/19 122/68  04/30/19 135/69   . Pharmacist Clinical Goal(s): o Over the next 90 days, patient will work with PharmD and providers to maintain BP goal <130/80 . Current regimen:  o Amlodipine 31m daily o Isosorbide 340mdaily . Interventions: o Reviewed most recent BP readings o Reviewed home monitoring procedure . Patient self care activities - Over the next 90 days, patient will: o Check BP daily, document, and provide at future appointments o Ensure daily salt intake < 2300 mg/day o Contact providers with consistent readings > 130/80  Hyperlipidemia Lab Results  Component Value Date/Time   LDLCALC 59 01/07/2020 12:27 PM   . Pharmacist Clinical Goal(s): o Over the next 90 days, patient will work with PharmD and providers to maintain LDL goal < 70 . Current regimen:  o Atorvastatin 4066maily o Zetia 17m76mily . Interventions: o Reviewed most recent lipid panel o Discussed dietary changes patient could make to bring down TG . Patient self care activities - Over the next 90 days, patient will: o Work on dietary changes such as limiting saturated fats and sugary foods o Continue to focus on medication adherence by pill count  Diabetes Lab Results  Component Value Date/Time   HGBA1C 9.8 (H) 01/07/2020 12:27 PM   HGBA1C 9.7 (H) 09/24/2019 02:02 PM    HGBA1C 11.3 (H) 07/22/2016 12:17 PM   HGBA1C 8.7 (H) 12/29/2014 08:44 AM   . Pharmacist Clinical Goal(s): o Over the next 90 days, patient will work with PharmD and providers to achieve A1c goal <7% . Current regimen:  o Tresiba 62 units hs o Metformin 1000mg75mce daily o Novolog 10 units three times per day with meals . Interventions: o Reviewed home blood sugar logs o Reviewed most recent A1c result o Discussed dietary changes such as carb counting to promote euglycemia o Provided chair exercises since patient does not like to get out in the heat . Patient self care activities - Over the next 90 days, patient will: o Check blood sugar 3-4 times daily, document, and provide at future appointments o Contact provider with any episodes of hypoglycemia o Work on hard on dietary changes provided to bring down glucose levels  CAD . Pharmacist Clinical Goal(s) o Over the next 90 days, patient will work with PharmD and providers to optimize medication and minimize symptoms related to CAD. . CurMarland Kitchenent regimen:  o ASA 81mg 36my o Plavix 75 mg daily . Interventions: o Counseled on signs symptoms of abnormal bleeding o Counseled on risk of bleeding with falls . Patient self care activities - Over the next 90 days, patient will: o Continue to focus on medication adherence by pill count o Contact providers with any signs/symptoms of abnormal bleeding.   Initial goal documentation        Kristen [name] was  given information about Chronic Care Management services today including:  1. CCM service includes personalized support from designated clinical staff supervised by her physician, including individualized plan of care and coordination with other care providers 2. 24/7 contact phone numbers for assistance for urgent and routine care needs. 3. Standard insurance, coinsurance, copays and deductibles apply for chronic care management only during months in which we provide at least 20 minutes of  these services. Most insurances cover these services at 100%, however patients may be responsible for any copay, coinsurance and/or deductible if applicable. This service may help you avoid the need for more expensive face-to-face services. 4. Only one practitioner may furnish and bill the service in a calendar month. 5. The patient may stop CCM services at any time (effective at the end of the month) by phone call to the office staff.  Patient agreed to services and verbal consent obtained.   The patient verbalized understanding of instructions provided today and agreed to receive a mailed copy of patient instruction and/or educational materials. Telephone follow up appointment with pharmacy team member scheduled for: 3 months  Beverly Milch, PharmD Clinical Pharmacist [place] [phone]   Exercises To Do While Sitting  Exercises that you do while sitting (chair exercises) can give you many of the same benefits as full exercise. Benefits include strengthening your heart, burning calories, and keeping muscles and joints healthy. Exercise can also improve your mood and help with depression and anxiety. You may benefit from chair exercises if you are unable to do standing exercises because of:  Diabetic foot pain.  Obesity.  Illness.  Arthritis.  Recovery from surgery or injury.  Breathing problems.  Balance problems.  Another type of disability. Before starting chair exercises, check with your health care provider or a physical therapist to find out how much exercise you can tolerate and which exercises are safe for you. If your health care provider approves:  Start out slowly and build up over time. Aim to work up to about 10-20 minutes for each exercise session.  Make exercise part of your daily routine.  Drink water when you exercise. Do not wait until you are thirsty. Drink every 10-15 minutes.  Stop exercising right away if you have pain,  nausea, shortness of breath, or dizziness.  If you are exercising in a wheelchair, make sure to lock the wheels.  Ask your health care provider whether you can do tai chi or yoga. Many positions in these mind-body exercises can be modified to do while seated. Warm-up Before starting other exercises: 1. Sit up as straight as you can. Have your knees bent at 90 degrees, which is the shape of the capital letter "L." Keep your feet flat on the floor. 2. Sit at the front edge of your chair, if you can. 3. Pull in (tighten) the muscles in your abdomen and stretch your spine and neck as straight as you can. Hold this position for a few minutes. 4. Breathe in and out evenly. Try to concentrate on your breathing, and relax your mind. Stretching Exercise A: Arm stretch 1. Hold your arms out straight in front of your body. 2. Bend your hands at the wrist with your fingers pointing up, as if signaling someone to stop. Notice the slight tension in your forearms as you hold the position. 3. Keeping your arms out and your hands bent, rotate your hands outward as far as you can and hold this stretch. Aim to have your thumbs  pointing up and your pinkie fingers pointing down. Slowly repeat arm stretches for one minute as tolerated. Exercise B: Leg stretch 1. If you can move your legs, try to "draw" letters on the floor with the toes of your foot. Write your name with one foot. 2. Write your name with the toes of your other foot. Slowly repeat the movements for one minute as tolerated. Exercise C: Reach for the sky 1. Reach your hands as far over your head as you can to stretch your spine. 2. Move your hands and arms as if you are climbing a rope. Slowly repeat the movements for one minute as tolerated. Range of motion exercises Exercise A: Shoulder roll 1. Let your arms hang loosely at your sides. 2. Lift just your shoulders up toward your ears, then let them relax back down. 3. When your shoulders feel  loose, rotate your shoulders in backward and forward circles. Do shoulder rolls slowly for one minute as tolerated. Exercise B: March in place 1. As if you are marching, pump your arms and lift your legs up and down. Lift your knees as high as you can. ? If you are unable to lift your knees, just pump your arms and move your ankles and feet up and down. March in place for one minute as tolerated. Exercise C: Seated jumping jacks 1. Let your arms hang down straight. 2. Keeping your arms straight, lift them up over your head. Aim to point your fingers to the ceiling. 3. While you lift your arms, straighten your legs and slide your heels along the floor to your sides, as wide as you can. 4. As you bring your arms back down to your sides, slide your legs back together. ? If you are unable to use your legs, just move your arms. Slowly repeat seated jumping jacks for one minute as tolerated. Strengthening exercises Exercise A: Shoulder squeeze 1. Hold your arms straight out from your body to your sides, with your elbows bent and your fists pointed at the ceiling. 2. Keeping your arms in the bent position, move them forward so your elbows and forearms meet in front of your face. 3. Open your arms back out as wide as you can with your elbows still bent, until you feel your shoulder blades squeezing together. Hold for 5 seconds. Slowly repeat the movements forward and backward for one minute as tolerated. Contact a health care provider if you:  Had to stop exercising due to any of the following: ? Pain. ? Nausea. ? Shortness of breath. ? Dizziness. ? Fatigue.  Have significant pain or soreness after exercising. Get help right away if you have:  Chest pain.  Difficulty breathing. These symptoms may represent a serious problem that is an emergency. Do not wait to see if the symptoms will go away. Get medical help right away. Call your local emergency services (911 in the [place] Do not drive  yourself to the hospital. This information is not intended to replace advice given to you by your health care provider. Make sure you discuss any questions you have with your health care provider. Document Revised: 09/20/2018 Document Reviewed: 04/12/2017 Elsevier Patient Education  2020 [employer] for Diabetes Mellitus, Adult  Carbohydrate counting is a method of keeping track of how many carbohydrates you eat. Eating carbohydrates naturally increases the amount of sugar (glucose) in the blood. Counting how many carbohydrates you eat helps keep your blood glucose within normal limits, which helps you manage  your diabetes (diabetes mellitus). It is important to know how many carbohydrates you can safely have in each meal. This is different for every person. A diet and nutrition specialist (registered dietitian) can help you make a meal plan and calculate how many carbohydrates you should have at each meal and snack. Carbohydrates are found in the following foods:  Grains, such as breads and cereals.  Dried beans and soy products.  Starchy vegetables, such as potatoes, peas, and corn.  Fruit and fruit juices.  Milk and yogurt.  Sweets and snack foods, such as cake, cookies, candy, chips, and soft drinks. How do I count carbohydrates? There are two ways to count carbohydrates in food. You can use either of the methods or a combination of both. Reading "Nutrition Facts" on packaged food The "Nutrition Facts" list is included on the labels of almost all packaged foods and beverages in the [place] It includes:  The serving size.  Information about nutrients in each serving, including the grams (g) of carbohydrate per serving. To use the "Nutrition Facts":  Decide how many servings you will have.  Multiply the number of servings by the number of carbohydrates per serving.  The resulting number is the total amount of carbohydrates that you will be  having. Learning standard serving sizes of other foods When you eat carbohydrate foods that are not packaged or do not include "Nutrition Facts" on the label, you need to measure the servings in order to count the amount of carbohydrates:  Measure the foods that you will eat with a food scale or measuring cup, if needed.  Decide how many standard-size servings you will eat.  Multiply the number of servings by 15. Most carbohydrate-rich foods have about 15 g of carbohydrates per serving. ? For example, if you eat 8 oz (170 g) of strawberries, you will have eaten 2 servings and 30 g of carbohydrates (2 servings x 15 g = 30 g).  For foods that have more than one food mixed, such as soups and casseroles, you must count the carbohydrates in each food that is included. The following list contains standard serving sizes of common carbohydrate-rich foods. Each of these servings has about 15 g of carbohydrates:   hamburger bun or  English muffin.   oz (15 mL) syrup.   oz (14 g) jelly.  1 slice of bread.  1 six-inch tortilla.  3 oz (85 g) cooked rice or pasta.  4 oz (113 g) cooked dried beans.  4 oz (113 g) starchy vegetable, such as peas, corn, or potatoes.  4 oz (113 g) hot cereal.  4 oz (113 g) mashed potatoes or  of a large baked potato.  4 oz (113 g) canned or frozen fruit.  4 oz (120 mL) fruit juice.  4-6 crackers.  6 chicken nuggets.  6 oz (170 g) unsweetened dry cereal.  6 oz (170 g) plain fat-free yogurt or yogurt sweetened with artificial sweeteners.  8 oz (240 mL) milk.  8 oz (170 g) fresh fruit or one small piece of fruit.  24 oz (680 g) popped popcorn. Example of carbohydrate counting Sample meal  3 oz (85 g) chicken breast.  6 oz (170 g) brown rice.  4 oz (113 g) corn.  8 oz (240 mL) milk.  8 oz (170 g) strawberries with sugar-free whipped topping. Carbohydrate calculation 1. Identify the foods that contain  carbohydrates: ? Rice. ? Corn. ? Milk. ? Strawberries. 2. Calculate how many servings you have of each  food: ? 2 servings rice. ? 1 serving corn. ? 1 serving milk. ? 1 serving strawberries. 3. Multiply each number of servings by 15 g: ? 2 servings rice x 15 g = 30 g. ? 1 serving corn x 15 g = 15 g. ? 1 serving milk x 15 g = 15 g. ? 1 serving strawberries x 15 g = 15 g. 4. Add together all of the amounts to find the total grams of carbohydrates eaten: ? 30 g + 15 g + 15 g + 15 g = 75 g of carbohydrates total. Summary  Carbohydrate counting is a method of keeping track of how many carbohydrates you eat.  Eating carbohydrates naturally increases the amount of sugar (glucose) in the blood.  Counting how many carbohydrates you eat helps keep your blood glucose within normal limits, which helps you manage your diabetes.  A diet and nutrition specialist (registered dietitian) can help you make a meal plan and calculate how many carbohydrates you should have at each meal and snack. This information is not intended to replace advice given to you by your health care provider. Make sure you discuss any questions you have with your health care provider. Document Revised: 12/22/2016 Document Reviewed: 11/11/2015 Elsevier Patient Education  [place].

## 2020-01-09 ENCOUNTER — Other Ambulatory Visit: Payer: Self-pay | Admitting: *Deleted

## 2020-01-09 MED ORDER — LISINOPRIL 2.5 MG PO TABS
2.5000 mg | ORAL_TABLET | Freq: Every day | ORAL | 3 refills | Status: DC
Start: 2020-01-09 — End: 2020-11-05

## 2020-01-10 ENCOUNTER — Other Ambulatory Visit: Payer: Self-pay | Admitting: Family Medicine

## 2020-01-13 ENCOUNTER — Telehealth: Payer: Self-pay | Admitting: Family Medicine

## 2020-01-13 NOTE — Telephone Encounter (Signed)
Pt needs form faxed to [facility] for medical scooter   Pt Call back [phone]

## 2020-01-13 NOTE — Telephone Encounter (Signed)
Advised patient multiple times that forms do not go to insurance, but rather the DME.   Patient reported that [employer] was to be the DME provider.   Forms and chart noted are in the to be faxed [facility].

## 2020-01-15 ENCOUNTER — Other Ambulatory Visit: Payer: Self-pay | Admitting: Family Medicine

## 2020-01-17 ENCOUNTER — Encounter: Payer: Self-pay | Admitting: Internal Medicine

## 2020-01-17 ENCOUNTER — Ambulatory Visit (INDEPENDENT_AMBULATORY_CARE_PROVIDER_SITE_OTHER): Payer: Medicare HMO | Admitting: Internal Medicine

## 2020-01-17 VITALS — BP 140/82 | HR 69 | Ht 62.0 in | Wt 213.0 lb

## 2020-01-17 DIAGNOSIS — I25119 Atherosclerotic heart disease of native coronary artery with unspecified angina pectoris: Secondary | ICD-10-CM

## 2020-01-17 DIAGNOSIS — Z950 Presence of cardiac pacemaker: Secondary | ICD-10-CM

## 2020-01-17 DIAGNOSIS — I441 Atrioventricular block, second degree: Secondary | ICD-10-CM | POA: Diagnosis not present

## 2020-01-17 DIAGNOSIS — I10 Essential (primary) hypertension: Secondary | ICD-10-CM

## 2020-01-17 LAB — CUP PACEART INCLINIC DEVICE CHECK
Brady Statistic RA Percent Paced: 49 %
Brady Statistic RV Percent Paced: 17 %
Date Time Interrogation Session: 20210806093209
Implantable Lead Implant Date: 20180709
Implantable Lead Implant Date: 20180709
Implantable Lead Location: 753859
Implantable Lead Location: 753860
Implantable Lead Model: 5076
Implantable Lead Model: 5076
Implantable Pulse Generator Implant Date: 20180709
Lead Channel Impedance Value: 370 Ohm
Lead Channel Impedance Value: 663 Ohm
Lead Channel Pacing Threshold Amplitude: 0.5 V
Lead Channel Pacing Threshold Amplitude: 0.7 V
Lead Channel Pacing Threshold Pulse Width: 0.4 ms
Lead Channel Pacing Threshold Pulse Width: 0.4 ms
Lead Channel Sensing Intrinsic Amplitude: 16.7 mV
Lead Channel Sensing Intrinsic Amplitude: 6.8 mV
Lead Channel Setting Pacing Amplitude: 2 V
Lead Channel Setting Pacing Amplitude: 2.4 V
Lead Channel Setting Pacing Pulse Width: 0.4 ms
Pulse Gen Model: 407145
Pulse Gen Serial Number: 69106657

## 2020-01-17 NOTE — Patient Instructions (Signed)
Medication Instructions:  Continue all current medications.  Labwork: none  Testing/Procedures: none  Follow-Up: 1 year - [facility]   Any Other Special Instructions Will Be Listed Below (If Applicable).  If you need a refill on your cardiac medications before your next appointment, please call your pharmacy.

## 2020-01-17 NOTE — Progress Notes (Signed)
PCP: Alycia Rossetti, MD Primary Cardiologist: Dr Domenic Polite Primary EP:  Dr Vincente Liberty Haskin is a 74 y.o. female who presents today for routine electrophysiology followup.  Since last being seen in our clinic, the patient reports doing very well.  She is not very active.  She has stable SOB with activity.  Today, she denies symptoms of palpitations, chest pain,  lower extremity edema, dizziness, presyncope, or syncope.  The patient is otherwise without complaint today.   Past Medical History:  Diagnosis Date   Anemia    Anxiety    Arthritis    Bifascicular block    Blood transfusion without reported diagnosis    CAD (coronary artery disease)    DES to LAD 02/2019   Depression    Diabetic neuropathy (HCC)    Essential hypertension    GERD (gastroesophageal reflux disease)    Hemorrhoids    History of stroke    a. MRI 12/2016 - remote left cerebellar infarcts incidentally noted.   Hyperlipidemia    Malignant neoplasm of upper-outer quadrant of left female breast ([place]) 11/30/2012   Nephrolithiasis    Obesity    Overactive bladder    Personal history of colonic polyps    09/2010 - 2 diminutive adenomas 02/12/2016 5 mm descending polyp - prolapse type polyp (not precancerous) no recall needed given hx, findings and age    S/P placement of cardiac pacemaker 01/03/2017   Type 2 diabetes mellitus ([place])    Vitamin D deficiency    Past Surgical History:  Procedure Laterality Date   APPENDECTOMY     BACK SURGERY     BREAST SURGERY     CARDIAC CATHETERIZATION  01/2009   cath by Dr Lia Foyer revealed nonobstructive CAD   [place], LAPAROSCOPIC     COLONOSCOPY     last 2012- with polyps   CORONARY STENT INTERVENTION N/A 02/26/2019   Procedure: CORONARY STENT INTERVENTION;  Surgeon: Jettie Booze, MD;  Location: [facility];  Service: Cardiovascular;  Laterality: N/A;   INTRAVASCULAR PRESSURE  WIRE/FFR STUDY N/A 02/26/2019   Procedure: INTRAVASCULAR PRESSURE WIRE/FFR STUDY;  Surgeon: Jettie Booze, MD;  Location: [facility];  Service: Cardiovascular;  Laterality: N/A;   JOINT REPLACEMENT     LEFT HEART CATH AND CORONARY ANGIOGRAPHY N/A 02/26/2019   Procedure: LEFT HEART CATH AND CORONARY ANGIOGRAPHY;  Surgeon: Jettie Booze, MD;  Location: [facility];  Service: Cardiovascular;  Laterality: N/A;   LITHOTRIPSY     MASTECTOMY Left 2014   MASTECTOMY W/ SENTINEL NODE BIOPSY Left 12/24/2012   Procedure: LEFT MASTECTOMY WITH LEFT SENTINEL LYMPH NODE BIOPSY;  Surgeon: Rolm Bookbinder, MD;  Location: [facility];  Service: General;  Laterality: Left;   OOPHORECTOMY Right    1.5  ovaries rem   PACEMAKER IMPLANT N/A 12/19/2016   Procedure: Pacemaker Implant;  Surgeon: Deboraha Sprang, MD;  Location: [facility];  Service: Cardiovascular;  Laterality: N/A;   PARTIAL HIP ARTHROPLASTY     left hip   POLYPECTOMY     REPLACEMENT TOTAL KNEE     both knees   TONSILLECTOMY     TOTAL MASTECTOMY Left 12/24/2012   Dr Donne Hazel    ROS- all systems are reviewed and negative except as per HPI above  Current Outpatient Medications  Medication Sig Dispense Refill   Accu-Chek Softclix Lancets lancets TEST BLOOD SUGAR THREE TIMES DAILY (  NEED MD APPOINTMENT) 200 each 0   amLODipine (NORVASC) 5 MG tablet TAKE 1 TABLET EVERY DAY 90 tablet 3   aspirin EC 81 MG tablet Take 81 mg by mouth daily.     atorvastatin (LIPITOR) 40 MG tablet TAKE 1 TABLET AT BEDTIME 90 tablet 3   cholecalciferol (VITAMIN D3) 25 MCG (1000 UNIT) tablet Take 1 tablet (1,000 Units total) by mouth daily.     clopidogrel (PLAVIX) 75 MG tablet Take 1 tablet (75 mg total) by mouth daily with breakfast. 90 tablet 3   ezetimibe (ZETIA) 10 MG tablet Take 1 tablet (10 mg total) by mouth daily. 90 tablet 3   FLUoxetine (PROZAC) 40 MG capsule Take 1 capsule (40 mg total) by mouth daily. 90  capsule 2   fluticasone (FLONASE) 50 MCG/ACT nasal spray USE 2 SPRAYS IN EACH NOSTRIL EVERY DAY AS NEEDED (SINUS ISSUES) (NEED TO CALL THE CLINIC TO SCHEDULE AN APPT FOR REFILLS) 16 g 0   gabapentin (NEURONTIN) 300 MG capsule TAKE ONE CAPSULE BY MOUTH 2 TIMES A DAY (Patient taking differently: Take 300 mg by mouth 2 (two) times daily. TAKE ONE CAPSULE BY MOUTH 2 TIMES A DAY) 180 capsule 3   glucose blood (ACCU-CHEK GUIDE) test strip Use as instructed to monitor FSBS 3x daily. Dx: E11.41. 300 each 3   Insulin Pen Needle (UNIFINE PENTIPS) 32G X 4 MM MISC USE TO INJECT TRESIBA AND NOVOLOG EVERY DAY AS DIRECTED 100 each 0   isosorbide mononitrate (IMDUR) 30 MG 24 hr tablet TAKE 1 TABLET (30 MG TOTAL) BY MOUTH DAILY. 90 tablet 1   lisinopril (ZESTRIL) 2.5 MG tablet Take 1 tablet (2.5 mg total) by mouth daily. 90 tablet 3   metFORMIN (GLUCOPHAGE) 1000 MG tablet TAKE 1 TABLET TWICE DAILY WITH MEALS 180 tablet 3   nitroGLYCERIN (NITROSTAT) 0.4 MG SL tablet Place 1 tablet (0.4 mg total) under the tongue every 5 (five) minutes as needed. 25 tablet 12   NOVOLOG FLEXPEN 100 UNIT/ML FlexPen INJECT 8 UNITS INTO THE SKIN THREE TIMES DAILY WITH MEALS  (Patient taking differently: 10 Units. ) 30 mL 3   oxybutynin (DITROPAN-XL) 5 MG 24 hr tablet TAKE 1 TABLET AT BEDTIME 90 tablet 3   pantoprazole (PROTONIX) 40 MG tablet Take 1 tablet (40 mg total) by mouth daily at 6 (six) AM. 90 tablet 3   TRESIBA FLEXTOUCH 100 UNIT/ML FlexTouch Pen INJECT 62 UNITS INTO THE SKIN AT BEDTIME 60 mL 3   No current facility-administered medications for this visit.    Physical Exam: Vitals:   01/17/20 0857  BP: 140/82  Pulse: 69  SpO2: 97%  Weight: 213 lb (96.6 kg)  Height: 5\' 2"  (1.575 m)    GEN- The patient is overweight appearing, alert and oriented x 3 today.   Head- normocephalic, atraumatic Eyes-  Sclera clear, conjunctiva pink Ears- hearing intact Oropharynx- clear Lungs-   normal work of  breathing Chest- pacemaker pocket is well healed Heart- Regular rate and rhythm  GI- soft  Extremities- no clubbing, cyanosis, or edema  Pacemaker interrogation- reviewed in detail today,  See PACEART report  ekg tracing ordered today is personally reviewed and shows sinus with first degree AV block, demand V pacing with occasional pseudofusion  Assessment and Plan:  1. Symptomatic second degree heart block and syncope Normal pacemaker function V paces 17% with some pseudofusion.  AV delays are programmed at max settings to reduce V pacing. See Claudia Desanctis Art report No changes today she is not  device dependant today  2. CAD S/p LAD PCI No ischemic symptoms  3. HTN Stable No change required today  4. HL  Continue lipitor and zetia   Risks, benefits and potential toxicities for medications prescribed and/or refilled reviewed with patient today  Follow-up with Dr Domenic Polite as scheduled.  I will see I a year   Thompson Grayer MD, [facility] 01/17/2020 9:14 AM

## 2020-02-04 DIAGNOSIS — E119 Type 2 diabetes mellitus without complications: Secondary | ICD-10-CM | POA: Diagnosis not present

## 2020-02-04 LAB — HM DIABETES EYE EXAM

## 2020-02-07 ENCOUNTER — Other Ambulatory Visit: Payer: Self-pay | Admitting: Family Medicine

## 2020-02-10 ENCOUNTER — Other Ambulatory Visit: Payer: Self-pay

## 2020-02-10 ENCOUNTER — Ambulatory Visit (INDEPENDENT_AMBULATORY_CARE_PROVIDER_SITE_OTHER): Payer: Medicare HMO | Admitting: Family Medicine

## 2020-02-10 ENCOUNTER — Encounter: Payer: Self-pay | Admitting: Family Medicine

## 2020-02-10 DIAGNOSIS — E1149 Type 2 diabetes mellitus with other diabetic neurological complication: Secondary | ICD-10-CM | POA: Diagnosis not present

## 2020-02-10 MED ORDER — NOVOLOG FLEXPEN 100 UNIT/ML ~~LOC~~ SOPN: PEN_INJECTOR | SUBCUTANEOUS | 3 refills | Status: DC

## 2020-02-10 NOTE — Assessment & Plan Note (Signed)
Controlled diabetes mellitus.  I think that she is eating too many carbs and sugar at nighttime when she has her milk and crackers that might be driving her fastings up.  However her postprandial sugars are all elevated.  We will keep her Tresiba 65 units.  We will increase her NovoLog to 12 units 3 times a day with meals.  She will continue to log her blood sugars 3 times a day.  Her daughter is supposed to assist her with her blood sugars in her meds.

## 2020-02-10 NOTE — Patient Instructions (Addendum)
Increase Novolog to  12 units with each meal  Continue TRESIBA at  65 units  Keep all the other meds the same  Stop drinking milk at bedtime and eating crackers at bedtime  F/U as previous

## 2020-02-10 NOTE — Progress Notes (Signed)
   Subjective:    Patient ID: Kristen [name], female    DOB: [birth year], 74 y.o.   MRN: [id number]  Patient presents for Follow-up (is not fasting)  Pt here to f/u diabetes - intermin visit to review CBG  Last A1C 9.8% in July   This AM 178  She tends to check her sugar three times a day    Fasting 140-180's,  After meals and before bedtime 200-280's   Tresiba 65 units at bedtime   Novolog 10 units with meals    She tends to eat crackers and milk at bedtime   She did have visit with cardiology a few weeks ago , no changes made    Review Of Systems:  GEN- denies fatigue, fever, weight loss,weakness, recent illness HEENT- denies eye drainage, change in vision, nasal discharge, CVS- denies chest pain, palpitations RESP- denies SOB, cough, wheeze ABD- denies N/V, change in stools, abd pain Neuro- denies headache, dizziness, syncope, seizure activity       Objective:    BP 138/84   Pulse 90   Temp 98.7 F (37.1 C) (Temporal)   Resp 14   Ht 5\' 2"  (1.575 m)   Wt 210 lb (95.3 kg)   SpO2 98%   BMI 38.41 kg/m  GEN- NAD, alert and oriented x3 CVS- RRR, no murmur RESP-CTAB EXT- No edema Pulses- Radial  2+        Assessment & Plan:      Problem List Items Addressed This Visit      Unprioritized   Type 2 diabetes mellitus with neurological complications ([place])    Controlled diabetes mellitus.  I think that she is eating too many carbs and sugar at nighttime when she has her milk and crackers that might be driving her fastings up.  However her postprandial sugars are all elevated.  We will keep her Tresiba 65 units.  We will increase her NovoLog to 12 units 3 times a day with meals.  She will continue to log her blood sugars 3 times a day.  Her daughter is supposed to assist her with her blood sugars in her meds.      Relevant Medications   insulin aspart (NOVOLOG FLEXPEN) 100 UNIT/ML FlexPen      Note: This dictation was prepared with Dragon dictation along with  smaller phrase technology. Any transcriptional errors that result from this process are unintentional.

## 2020-02-19 ENCOUNTER — Telehealth: Payer: Self-pay | Admitting: Pharmacist

## 2020-02-19 NOTE — Progress Notes (Cosign Needed)
Opened in error. Please disregard.

## 2020-02-20 ENCOUNTER — Other Ambulatory Visit: Payer: Self-pay | Admitting: Family Medicine

## 2020-02-28 ENCOUNTER — Other Ambulatory Visit: Payer: Self-pay | Admitting: Family Medicine

## 2020-03-02 ENCOUNTER — Other Ambulatory Visit: Payer: Self-pay | Admitting: Family Medicine

## 2020-03-02 ENCOUNTER — Telehealth: Payer: Self-pay | Admitting: *Deleted

## 2020-03-02 NOTE — Telephone Encounter (Signed)
Confirmed LTS appt with Mendel Ryder, NP on 12/9 at 11am. No further needs or questions voiced.

## 2020-03-25 ENCOUNTER — Ambulatory Visit (INDEPENDENT_AMBULATORY_CARE_PROVIDER_SITE_OTHER): Payer: Medicare HMO

## 2020-03-25 ENCOUNTER — Other Ambulatory Visit: Payer: Self-pay | Admitting: Family Medicine

## 2020-03-25 DIAGNOSIS — I441 Atrioventricular block, second degree: Secondary | ICD-10-CM | POA: Diagnosis not present

## 2020-03-27 ENCOUNTER — Telehealth: Payer: Self-pay

## 2020-03-27 LAB — CUP PACEART REMOTE DEVICE CHECK
Date Time Interrogation Session: 20211013081059
Implantable Lead Implant Date: 20180709
Implantable Lead Implant Date: 20180709
Implantable Lead Location: 753859
Implantable Lead Location: 753860
Implantable Lead Model: 5076
Implantable Lead Model: 5076
Implantable Pulse Generator Implant Date: 20180709
Pulse Gen Model: 407145
Pulse Gen Serial Number: 69106657

## 2020-03-27 NOTE — Telephone Encounter (Signed)
[facility] is requesting refill for gabapentin (NEURONTIN) 300 MG capsule  error

## 2020-03-30 ENCOUNTER — Other Ambulatory Visit: Payer: Self-pay | Admitting: Family Medicine

## 2020-03-30 DIAGNOSIS — E0841 Diabetes mellitus due to underlying condition with diabetic mononeuropathy: Secondary | ICD-10-CM

## 2020-03-30 NOTE — Telephone Encounter (Signed)
Refill Gabapentin 300 MG, Novolog Flexpen [facility] - [place], [place] - [address]

## 2020-03-30 NOTE — Progress Notes (Signed)
Remote pacemaker transmission.   

## 2020-03-31 MED ORDER — GABAPENTIN 300 MG PO CAPS: ORAL_CAPSULE | ORAL | 3 refills | Status: DC

## 2020-03-31 MED ORDER — NOVOLOG FLEXPEN 100 UNIT/ML ~~LOC~~ SOPN
PEN_INJECTOR | SUBCUTANEOUS | 3 refills | Status: DC
Start: 2020-03-31 — End: 2020-06-10

## 2020-03-31 NOTE — Telephone Encounter (Signed)
Prescription sent to pharmacy.

## 2020-04-06 ENCOUNTER — Ambulatory Visit: Payer: Medicare HMO | Admitting: Cardiology

## 2020-04-09 ENCOUNTER — Ambulatory Visit: Payer: Self-pay

## 2020-04-09 NOTE — Patient Instructions (Addendum)
Visit Information  Goals Addressed            This Visit's Progress   . Pharmacy Care Plan:       CARE PLAN ENTRY (see longitudinal plan of care for additional care plan information)  Current Barriers:  . Chronic Disease Management support, education, and care coordination needs related to Hypertension, Hyperlipidemia, Diabetes, and Coronary Artery Disease   Hypertension BP Readings from Last 3 Encounters:  02/10/20 138/84  01/17/20 140/82  01/07/20 128/70   . Pharmacist Clinical Goal(s): o Over the next 90 days, patient will work with PharmD and providers to maintain BP goal <130/80 . Current regimen:  o Amlodipine 5mg  daily o Isosorbide 30mg  daily o Lisinopril 2.5mg  . Interventions: o Reviewed most recent BP readings o Reviewed home monitoring procedure . Patient self care activities - Over the next 90 days, patient will: o Check BP daily, document, and provide at future appointments o Ensure daily salt intake < 2300 mg/day o Contact providers with consistent readings > 130/80  Hyperlipidemia Lab Results  Component Value Date/Time   LDLCALC 59 01/07/2020 12:27 PM   . Pharmacist Clinical Goal(s): o Over the next 90 days, patient will work with PharmD and providers to maintain LDL goal < 70 . Current regimen:  o Atorvastatin 40mg  daily o Zetia 10mg  daily . Interventions: o Reviewed most recent lipid panel o Discussed dietary changes patient could make to bring down TG . Patient self care activities - Over the next 90 days, patient will: o Work on dietary changes such as limiting saturated fats and sugary foods o Continue to focus on medication adherence by pill count  Diabetes Lab Results  Component Value Date/Time   HGBA1C 9.8 (H) 01/07/2020 12:27 PM   HGBA1C 9.7 (H) 09/24/2019 02:02 PM   HGBA1C 11.3 (H) 07/22/2016 12:17 PM   HGBA1C 8.7 (H) 12/29/2014 08:44 AM   . Pharmacist Clinical Goal(s): o Over the next 90 days, patient will work with PharmD and  providers to achieve A1c goal <7% . Current regimen:  o Tresiba 62 units hs o Metformin 1000mg  twice daily o Novolog 12 units three times per day with meals . Interventions: o Reviewed home blood sugar logs o Reviewed most recent A1c result o Discussed dietary changes such as carb counting to promote euglycemia o Provided chair exercises since patient does not like to get out in the heat . Patient self care activities - Over the next 90 days, patient will: o Check blood sugar 3-4 times daily, document, and provide at future appointments o Contact provider with any episodes of hypoglycemia o Work on hard on dietary changes provided to bring down glucose levels  Initial goal documentation        The patient verbalized understanding of instructions provided today and agreed to receive a mailed copy of patient instruction and/or educational materials.  Telephone follow up appointment with pharmacy team member scheduled for:  Beverly Milch, PharmD Clinical Pharmacist [place] [phone]   Carbohydrate Counting for Diabetes Mellitus, Adult  Carbohydrate counting is a method of keeping track of how many carbohydrates you eat. Eating carbohydrates naturally increases the amount of sugar (glucose) in the blood. Counting how many carbohydrates you eat helps keep your blood glucose within normal limits, which helps you manage your diabetes (diabetes mellitus). It is important to know how many carbohydrates you can safely have in each meal. This is different for every person. A diet and nutrition specialist (registered dietitian) can  help you make a meal plan and calculate how many carbohydrates you should have at each meal and snack. Carbohydrates are found in the following foods:  Grains, such as breads and cereals.  Dried beans and soy products.  Starchy vegetables, such as potatoes, peas, and corn.  Fruit and fruit juices.  Milk and yogurt.  Sweets and  snack foods, such as cake, cookies, candy, chips, and soft drinks. How do I count carbohydrates? There are two ways to count carbohydrates in food. You can use either of the methods or a combination of both. Reading "Nutrition Facts" on packaged food The "Nutrition Facts" list is included on the labels of almost all packaged foods and beverages in the [place] It includes:  The serving size.  Information about nutrients in each serving, including the grams (g) of carbohydrate per serving. To use the "Nutrition Facts":  Decide how many servings you will have.  Multiply the number of servings by the number of carbohydrates per serving.  The resulting number is the total amount of carbohydrates that you will be having. Learning standard serving sizes of other foods When you eat carbohydrate foods that are not packaged or do not include "Nutrition Facts" on the label, you need to measure the servings in order to count the amount of carbohydrates:  Measure the foods that you will eat with a food scale or measuring cup, if needed.  Decide how many standard-size servings you will eat.  Multiply the number of servings by 15. Most carbohydrate-rich foods have about 15 g of carbohydrates per serving. ? For example, if you eat 8 oz (170 g) of strawberries, you will have eaten 2 servings and 30 g of carbohydrates (2 servings x 15 g = 30 g).  For foods that have more than one food mixed, such as soups and casseroles, you must count the carbohydrates in each food that is included. The following list contains standard serving sizes of common carbohydrate-rich foods. Each of these servings has about 15 g of carbohydrates:   hamburger bun or  English muffin.   oz (15 mL) syrup.   oz (14 g) jelly.  1 slice of bread.  1 six-inch tortilla.  3 oz (85 g) cooked rice or pasta.  4 oz (113 g) cooked dried beans.  4 oz (113 g) starchy vegetable, such as peas, corn, or potatoes.  4 oz (113 g) hot  cereal.  4 oz (113 g) mashed potatoes or  of a large baked potato.  4 oz (113 g) canned or frozen fruit.  4 oz (120 mL) fruit juice.  4-6 crackers.  6 chicken nuggets.  6 oz (170 g) unsweetened dry cereal.  6 oz (170 g) plain fat-free yogurt or yogurt sweetened with artificial sweeteners.  8 oz (240 mL) milk.  8 oz (170 g) fresh fruit or one small piece of fruit.  24 oz (680 g) popped popcorn. Example of carbohydrate counting Sample meal  3 oz (85 g) chicken breast.  6 oz (170 g) brown rice.  4 oz (113 g) corn.  8 oz (240 mL) milk.  8 oz (170 g) strawberries with sugar-free whipped topping. Carbohydrate calculation 1. Identify the foods that contain carbohydrates: ? Rice. ? Corn. ? Milk. ? Strawberries. 2. Calculate how many servings you have of each food: ? 2 servings rice. ? 1 serving corn. ? 1 serving milk. ? 1 serving strawberries. 3. Multiply each number of servings by 15 g: ? 2 servings rice x 15 g =  30 g. ? 1 serving corn x 15 g = 15 g. ? 1 serving milk x 15 g = 15 g. ? 1 serving strawberries x 15 g = 15 g. 4. Add together all of the amounts to find the total grams of carbohydrates eaten: ? 30 g + 15 g + 15 g + 15 g = 75 g of carbohydrates total. Summary  Carbohydrate counting is a method of keeping track of how many carbohydrates you eat.  Eating carbohydrates naturally increases the amount of sugar (glucose) in the blood.  Counting how many carbohydrates you eat helps keep your blood glucose within normal limits, which helps you manage your diabetes.  A diet and nutrition specialist (registered dietitian) can help you make a meal plan and calculate how many carbohydrates you should have at each meal and snack. This information is not intended to replace advice given to you by your health care provider. Make sure you discuss any questions you have with your health care provider. Document Revised: 12/22/2016 Document Reviewed: 11/11/2015 Elsevier  Patient Education  [place].

## 2020-04-09 NOTE — Chronic Care Management (AMB) (Signed)
Chronic Care Management   Follow Up Note   04/09/2020 Name: Liviya Santini MRN: [id number] DOB: 1945-10-29  Referred by: Alycia Rossetti, MD Reason for referral : Chronic Care Management (PharmD follow up visit)   Aliena Ghrist is a 74 y.o. year old female who is a primary care patient of [place], Modena Nunnery, MD. The CCM team was consulted for assistance with chronic disease management and care coordination needs.    Review of patient status, including review of consultants reports, relevant laboratory and other test results, and collaboration with appropriate care team members and the patient's provider was performed as part of comprehensive patient evaluation and provision of chronic care management services.    SDOH (Social Determinants of Health) assessments performed: No See Care Plan activities for detailed interventions related to [facility])     Outpatient Encounter Medications as of 04/09/2020  Medication Sig  . Accu-Chek Softclix Lancets lancets TEST BLOOD SUGAR THREE TIMES DAILY (NEED MD APPOINTMENT)  . amLODipine (NORVASC) 5 MG tablet TAKE 1 TABLET EVERY DAY  . aspirin EC 81 MG tablet Take 81 mg by mouth daily.  Marland Kitchen atorvastatin (LIPITOR) 40 MG tablet TAKE 1 TABLET AT BEDTIME  . cholecalciferol (VITAMIN D3) 25 MCG (1000 UNIT) tablet Take 1 tablet (1,000 Units total) by mouth daily.  . clopidogrel (PLAVIX) 75 MG tablet Take 1 tablet (75 mg total) by mouth daily with breakfast.  . ezetimibe (ZETIA) 10 MG tablet Take 1 tablet (10 mg total) by mouth daily.  Marland Kitchen FLUoxetine (PROZAC) 40 MG capsule Take 1 capsule (40 mg total) by mouth daily.  . fluticasone (FLONASE) 50 MCG/ACT nasal spray USE 2 SPRAYS IN EACH NOSTRIL EVERY DAY AS NEEDED (SINUS ISSUES) (NEED TO CALL THE CLINIC TO SCHEDULE AN APPT FOR REFILLS)  . gabapentin (NEURONTIN) 300 MG capsule TAKE ONE CAPSULE BY MOUTH 2 TIMES A DAY  . glucose blood (ACCU-CHEK GUIDE) test strip Use as instructed to monitor FSBS 3x daily. Dx: E11.41.   Marland Kitchen insulin aspart (NOVOLOG FLEXPEN) 100 UNIT/ML FlexPen Inject 12 units SQ 3x daily with meals  . Insulin Pen Needle (UNIFINE PENTIPS) 32G X 4 MM MISC USE TO INJECT TRESIBA AND NOVOLOG EVERY DAY AS DIRECTED  . isosorbide mononitrate (IMDUR) 30 MG 24 hr tablet TAKE 1 TABLET (30 MG TOTAL) BY MOUTH DAILY.  Marland Kitchen lisinopril (ZESTRIL) 2.5 MG tablet Take 1 tablet (2.5 mg total) by mouth daily.  . metFORMIN (GLUCOPHAGE) 1000 MG tablet TAKE 1 TABLET TWICE DAILY WITH MEALS  . nitroGLYCERIN (NITROSTAT) 0.4 MG SL tablet Place 1 tablet (0.4 mg total) under the tongue every 5 (five) minutes as needed.  Marland Kitchen oxybutynin (DITROPAN-XL) 5 MG 24 hr tablet TAKE 1 TABLET AT BEDTIME  . pantoprazole (PROTONIX) 40 MG tablet Take 1 tablet (40 mg total) by mouth daily at 6 (six) AM.  . TRESIBA FLEXTOUCH 100 UNIT/ML FlexTouch Pen INJECT 62 UNITS INTO THE SKIN AT BEDTIME   No facility-administered encounter medications on file as of 04/09/2020.     Goals Addressed            This Visit's Progress   . Pharmacy Care Plan:       CARE PLAN ENTRY (see longitudinal plan of care for additional care plan information)  Current Barriers:  . Chronic Disease Management support, education, and care coordination needs related to Hypertension, Hyperlipidemia, Diabetes, and Coronary Artery Disease   Hypertension BP Readings from Last 3 Encounters:  02/10/20 138/84  01/17/20 140/82  01/07/20 128/70   . Pharmacist  Clinical Goal(s): o Over the next 90 days, patient will work with PharmD and providers to maintain BP goal <130/80 . Current regimen:  o Amlodipine 68m daily o Isosorbide 339mdaily o Lisinopril 2.62m49m Interventions: o Reviewed most recent BP readings o Reviewed home monitoring procedure . Patient self care activities - Over the next 90 days, patient will: o Check BP daily, document, and provide at future appointments o Ensure daily salt intake < 2300 mg/day o Contact providers with consistent readings >  130/80  Hyperlipidemia Lab Results  Component Value Date/Time   LDLCALC 59 01/07/2020 12:27 PM   . Pharmacist Clinical Goal(s): o Over the next 90 days, patient will work with PharmD and providers to maintain LDL goal < 70 . Current regimen:  o Atorvastatin 33m362mily o Zetia 10mg262mly . Interventions: o Reviewed most recent lipid panel o Discussed dietary changes patient could make to bring down TG . Patient self care activities - Over the next 90 days, patient will: o Work on dietary changes such as limiting saturated fats and sugary foods o Continue to focus on medication adherence by pill count  Diabetes Lab Results  Component Value Date/Time   HGBA1C 9.8 (H) 01/07/2020 12:27 PM   HGBA1C 9.7 (H) 09/24/2019 02:02 PM   HGBA1C 11.3 (H) 07/22/2016 12:17 PM   HGBA1C 8.7 (H) 12/29/2014 08:44 AM   . Pharmacist Clinical Goal(s): o Over the next 90 days, patient will work with PharmD and providers to achieve A1c goal <7% . Current regimen:  o Tresiba 62 units hs o Metformin 1000mg 60me daily o Novolog 12 units three times per day with meals . Interventions: o Reviewed home blood sugar logs o Reviewed most recent A1c result o Discussed dietary changes such as carb counting to promote euglycemia o Provided chair exercises since patient does not like to get out in the heat . Patient self care activities - Over the next 90 days, patient will: o Check blood sugar 3-4 times daily, document, and provide at future appointments o Contact provider with any episodes of hypoglycemia o Work on hard on dietary changes provided to bring down glucose levels  Initial goal documentation       Diabetes   A1c goal <7%  Recent Relevant Labs: Lab Results  Component Value Date/Time   HGBA1C 9.8 (H) 01/07/2020 12:27 PM   HGBA1C 9.7 (H) 09/24/2019 02:02 PM   HGBA1C 11.3 (H) 07/22/2016 12:17 PM   HGBA1C 8.7 (H) 12/29/2014 08:44 AM   MICROALBUR 349.2 01/07/2020 12:27 PM   MICROALBUR 47.5  10/16/2018 12:38 PM    Last diabetic Eye exam:  Lab Results  Component Value Date/Time   HMDIABEYEEXA No Retinopathy 02/04/2020 01:48 PM    Last diabetic Foot exam: No results found for: HMDIABFOOTEX   Checking BG: 3x per Day  Recent FBG Readings: 160-170 am Recent pre-meal BG readings: 130-140 Recent 2hr PP BG readings:  170s  Patient has failed these meds in past: none noted Patient is currently uncontrolled on the following medications: . Novolog 12 units tid with meals . Tresiba 65 units . Metformin 1000mg t36m daily with meals   We discussed:   Novolog was increased to 12 units tid with meals  She denies any hypoglycemia after dose increase  Still tolerating all other meds  Diet - she did mention that she has cut back on her milk and crackers snack at night, provided alternatives such as nuts, fresh veggies, etc to replace this.  Recommend repeat A1c at appointment with PCP in November, if elevated will need to continue to titrate her meal time insulin.  Plan  Continue current medications, f/u A1c in Nov and titrate Novolog accordingly.  Hyperlipidemia   LDL goal < 70 Last lipids Lab Results  Component Value Date   CHOL 123 01/07/2020   HDL 31 (L) 01/07/2020   LDLCALC 59 01/07/2020   TRIG 283 (H) 01/07/2020   CHOLHDL 4.0 01/07/2020   Hepatic Function Latest Ref Rng & Units 01/07/2020 09/24/2019 10/16/2018  Total Protein 6.1 - 8.1 g/dL 6.7 7.1 7.1  Albumin 3.5 - 5.0 g/dL - - -  AST 10 - 35 U/L _0 ALT 6 - 29 U/L _1 Alk Phosphatase 38 - 126 U/L - - -  Total Bilirubin 0.2 - 1.2 mg/dL 0.4 0.4 0.3  Bilirubin, Direct 0.0 - 0.3 mg/dL - - -     The ASCVD Risk score ([place]., et al., 2013) failed to calculate for the following reasons:   The valid total cholesterol range is 130 to 320 mg/dL   Patient has failed these meds in past: none noted Patient is currently controlled on the following medications:  . Zetia 68m . Atorvastatin  4554m We discussed:    Tolerating both medications  LDL is controlled, TG's still elevated  Diet - as she continues to work on her diet, I expect TG's to improve  Recommend repeat lipid panel at next [facility] medications Hypertension   BP goal is:  <130/80  Office blood pressures are  BP Readings from Last 3 Encounters:  02/10/20 138/84  01/17/20 140/82  01/07/20 128/70   Patient checks BP at home infrequently Patient home BP readings are ranging: no logs  Patient has failed these meds in the past: none noted Patient is currently controlled on the following medications:  . Lisinopril 2.54m77maily . Amlodipine 54mg73mily . Imdur 30mg64mly  We discussed   She is tolerating addition of Lisinopril due to proteinuria.  BP controlled in office, her daughter check her BP and it has been normal at home.  Counseled on BP goals < 130/80.  Plan  Continue current medications     ChrisBeverly MilchrmD Clinical Pharmacist [facility] [phone]

## 2020-04-11 ENCOUNTER — Other Ambulatory Visit: Payer: Self-pay | Admitting: Physician Assistant

## 2020-04-15 ENCOUNTER — Other Ambulatory Visit: Payer: Self-pay | Admitting: Family Medicine

## 2020-04-17 ENCOUNTER — Other Ambulatory Visit: Payer: Self-pay | Admitting: Family Medicine

## 2020-04-27 ENCOUNTER — Other Ambulatory Visit: Payer: Self-pay | Admitting: Family Medicine

## 2020-05-11 ENCOUNTER — Encounter: Payer: Medicare HMO | Admitting: Family Medicine

## 2020-05-11 ENCOUNTER — Other Ambulatory Visit: Payer: Self-pay | Admitting: Family Medicine

## 2020-05-13 ENCOUNTER — Encounter: Payer: Medicare HMO | Admitting: Family Medicine

## 2020-05-13 ENCOUNTER — Other Ambulatory Visit: Payer: Self-pay | Admitting: Cardiology

## 2020-05-14 ENCOUNTER — Other Ambulatory Visit: Payer: Self-pay | Admitting: Cardiology

## 2020-05-14 ENCOUNTER — Telehealth: Payer: Self-pay | Admitting: Adult Health

## 2020-05-14 NOTE — Telephone Encounter (Signed)
Rescheduled appts per LC template change. Pt's daughter confirmed appt date and time.

## 2020-05-15 ENCOUNTER — Other Ambulatory Visit: Payer: Self-pay

## 2020-05-15 ENCOUNTER — Ambulatory Visit (INDEPENDENT_AMBULATORY_CARE_PROVIDER_SITE_OTHER): Payer: Medicare HMO | Admitting: Family Medicine

## 2020-05-15 ENCOUNTER — Encounter: Payer: Self-pay | Admitting: Family Medicine

## 2020-05-15 VITALS — BP 136/82 | HR 100 | Temp 98.1°F | Resp 18 | Ht 62.0 in | Wt 212.0 lb

## 2020-05-15 DIAGNOSIS — I2510 Atherosclerotic heart disease of native coronary artery without angina pectoris: Secondary | ICD-10-CM

## 2020-05-15 DIAGNOSIS — E1143 Type 2 diabetes mellitus with diabetic autonomic (poly)neuropathy: Secondary | ICD-10-CM | POA: Diagnosis not present

## 2020-05-15 DIAGNOSIS — D492 Neoplasm of unspecified behavior of bone, soft tissue, and skin: Secondary | ICD-10-CM | POA: Diagnosis not present

## 2020-05-15 DIAGNOSIS — E782 Mixed hyperlipidemia: Secondary | ICD-10-CM

## 2020-05-15 DIAGNOSIS — Z0001 Encounter for general adult medical examination with abnormal findings: Secondary | ICD-10-CM | POA: Diagnosis not present

## 2020-05-15 DIAGNOSIS — E1149 Type 2 diabetes mellitus with other diabetic neurological complication: Secondary | ICD-10-CM

## 2020-05-15 DIAGNOSIS — E559 Vitamin D deficiency, unspecified: Secondary | ICD-10-CM

## 2020-05-15 DIAGNOSIS — Z23 Encounter for immunization: Secondary | ICD-10-CM | POA: Diagnosis not present

## 2020-05-15 DIAGNOSIS — Z0000 Encounter for general adult medical examination without abnormal findings: Secondary | ICD-10-CM

## 2020-05-15 DIAGNOSIS — D485 Neoplasm of uncertain behavior of skin: Secondary | ICD-10-CM

## 2020-05-15 DIAGNOSIS — D489 Neoplasm of uncertain behavior, unspecified: Secondary | ICD-10-CM

## 2020-05-15 NOTE — Progress Notes (Signed)
Subjective:   Patient presents for Medicare Annual/Subsequent preventive examination.     Pt here for wellness visit. She is here with her daughter    She has lesion on forearm for the past 2 weeks, she has been picking at it , neosporin has helped   Last A1C 9.8% in July   This AM 178  She tends to check her sugar three times a day    Fasting 140-180's,  After meals and before bedtime 200-280's   Tresiba 65 units at bedtime   Novolog 12 units with meals    fastimh 160-200  afternoon101-180's This am fasting 173  last night  71    HTN- takin gbp med as pescribe      Missed cardiology appt, oncology appt was rescheduled           Review Past Medical/Family/Social:   Risk Factors  Current exercise habits: none Dietary issues discussed: yes  Cardiac risk factors: Obesity (BMI >= 30 kg/m2). DM, CAD  Depression Screen  (Note: if answer to either of the following is "Yes", a more complete depression screening is indicated)  Over the past two weeks, have you felt down, depressed or hopeless? No Over the past two weeks, have you felt little interest or pleasure in doing things? No Have you lost interest or pleasure in daily life? No Do you often feel hopeless? No Do you cry easily over simple problems? No   Activities of Daily Living  In your present state of health, do you have any difficulty performing the following activities?:  Driving? No  Managing money? No  Feeding yourself? No  Getting from bed to chair? No  Climbing a flight of stairs? No  Preparing food and eating?: No  Bathing or showering? No  Getting dressed: No  Getting to the toilet? No  Using the toilet:No  Moving around from place to place: No  In the past year have you fallen or had a near fall?:No  Are you sexually active? No  Do you have more than one partner? No   Hearing Difficulties: No  Do you often ask people to speak up or repeat themselves? No  Do you experience ringing or noises  in your ears? No Do you have difficulty understanding soft or whispered voices? No  Do you feel that you have a problem with memory? No Do you often misplace items? No  Do you feel safe at home? Yes  Cognitive Testing  Alert? Yes Normal Appearance?Yes  Oriented to person? Yes Place? Yes  Time? Yes  Recall of three objects? Yes  Can perform simple calculations? Yes  Displays appropriate judgment?Yes  Can read the correct time from a watch face?Yes   List the Names of Other Physician/Practitioners you currently use:  ONcology  Cardiology  Eye doctor    Screening Tests / Date Colonoscopy  UTD 2017                    Zostavax  Due  Mammogram - March UTD  Influenza Vaccine Due  PNA - UTD  Tetanus/tdap COVID-19 Moderna shots done- [facility] in [place]  ROS: GEN- denies fatigue, fever, weight loss,weakness, recent illness HEENT- denies eye drainage, change in vision, nasal discharge, CVS- denies chest pain, palpitations RESP- denies SOB, cough, wheeze ABD- denies N/V, change in stools, abd pain GU- denies dysuria, hematuria, dribbling, incontinence MSK- denies joint pain, muscle aches, injury Neuro- denies headache, dizziness, syncope, seizure activity  Physical vitals reviewed  GEN-  NAD, alert and oriented x3 HEENT- PERRL, EOMI, non injected sclera, pink conjunctiva, MMM, oropharynx clear Neck- Supple, no thryomegaly CVS- RRR, no murmur RESP-CTAB ABD-NABS,soft, NT,  ND Skin right forearm 1cm  papular grwoth with warty apperance in center with scab, hyperpigmentation ,NT  EXT- No edema Pulses- Radial, DP- 2+  Shave Biopsy  Procedure- Shave Biopspy  Procedure explained to patient questions answered benefits and risks discussed verbal consent obtained. Antiseptic-Betadine  Anesthesia-lidocaine 1% with Epi 2CC  Razor blade used to shave lesion off from base  Minimal blood loss, Drysol to biopsy site persistent oozing Patient tolerated procedure well Bandage  applied    Assessment:    Annual wellness medicare exam   Plan:    During the course of the visit the patient was educated and counseled about appropriate screening and preventive services including:    Immunizations- pt to check on shingles vaccine at pharmacy   FLu shot given   DM- uncontrolled but her readings today look good, check A1C goal is < 8%, no hypoglycemia symptoms  HTN-/CAD- CONTROLLED NO changes   Neoplasm skin- s/p shave biopsy, dermatology pending results    Gait instability/OA - Pt unable to use manual wheelchair as unable to self propel herself She is able to use tiller system on power scooter Cane/ walker are not sufficient for any distance walking due to recurrent falls.  Pt home is able to accomodate power scooter. Scooter will allow pt to be more independent with ADL's  She is willing and able to use power scooter    Daughter is POA             Diet review for nutrition referral? Yes ____ Not Indicated __x__  Patient Instructions (the written plan) was given to the patient.  Medicare Attestation  I have personally reviewed:  The patient's medical and social history  Their use of alcohol, tobacco or illicit drugs  Their current medications and supplements  The patient's functional ability including ADLs,fall risks, home safety risks, cognitive, and hearing and visual impairment  Diet and physical activities  Evidence for depression or mood disorders  The patient's weight, height, BMI, and visual acuity have been recorded in the chart. I have made referrals, counseling, and provided education to the patient based on review of the above and I have provided the patient with a written personalized care plan for preventive services.

## 2020-05-15 NOTE — Patient Instructions (Addendum)
[facility]- Dr. Patel/Dr. Veleta Miners Dr. Delphina Cahill Check into shingles vaccine at [facility] in 4 months

## 2020-05-16 LAB — HEMOGLOBIN A1C
Hgb A1c MFr Bld: 8.3 % of total Hgb — ABNORMAL HIGH (ref <5.7)
Mean Plasma Glucose: 192 (calc)
eAG (mmol/L): 10.6 (calc)

## 2020-05-16 LAB — CBC WITH DIFFERENTIAL/PLATELET
Absolute Monocytes: 727 cells/uL (ref 200–950)
Basophils Absolute: 43 cells/uL (ref 0–200)
Basophils Relative: 0.6 %
Eosinophils Absolute: 230 cells/uL (ref 15–500)
Eosinophils Relative: 3.2 %
HCT: 39 % (ref 35.0–45.0)
Hemoglobin: 13 g/dL (ref 11.7–15.5)
Lymphs Abs: 2506 cells/uL (ref 850–3900)
MCH: 30.2 pg (ref 27.0–33.0)
MCHC: 33.3 g/dL (ref 32.0–36.0)
MCV: 90.7 fL (ref 80.0–100.0)
MPV: 10.4 fL (ref 7.5–12.5)
Monocytes Relative: 10.1 %
Neutro Abs: 3694 cells/uL (ref 1500–7800)
Neutrophils Relative %: 51.3 %
Platelets: 278 10*3/uL (ref 140–400)
RBC: 4.3 10*6/uL (ref 3.80–5.10)
RDW: 14.4 % (ref 11.0–15.0)
Total Lymphocyte: 34.8 %
WBC: 7.2 10*3/uL (ref 3.8–10.8)

## 2020-05-16 LAB — VITAMIN D 25 HYDROXY (VIT D DEFICIENCY, FRACTURES): Vit D, 25-Hydroxy: 45 ng/mL (ref 30–100)

## 2020-05-16 LAB — LIPID PANEL
Cholesterol: 132 mg/dL (ref <200)
HDL: 31 mg/dL — ABNORMAL LOW (ref >=50)
LDL Cholesterol (Calc): 64 mg/dL (calc)
Non-HDL Cholesterol (Calc): 101 mg/dL (calc) (ref <130)
Total CHOL/HDL Ratio: 4.3 (calc) (ref <5.0)
Triglycerides: 279 mg/dL — ABNORMAL HIGH (ref <150)

## 2020-05-16 LAB — COMPREHENSIVE METABOLIC PANEL
AG Ratio: 1.5 (calc) (ref 1.0–2.5)
ALT: 18 U/L (ref 6–29)
AST: 22 U/L (ref 10–35)
Albumin: 4.2 g/dL (ref 3.6–5.1)
Alkaline phosphatase (APISO): 60 U/L (ref 37–153)
BUN/Creatinine Ratio: 21 (calc) (ref 6–22)
BUN: 21 mg/dL (ref 7–25)
CO2: 27 mmol/L (ref 20–32)
Calcium: 9.2 mg/dL (ref 8.6–10.4)
Chloride: 102 mmol/L (ref 98–110)
Creat: 1.02 mg/dL — ABNORMAL HIGH (ref 0.60–0.93)
Globulin: 2.8 g/dL (calc) (ref 1.9–3.7)
Glucose, Bld: 152 mg/dL — ABNORMAL HIGH (ref 65–99)
Potassium: 5.1 mmol/L (ref 3.5–5.3)
Sodium: 140 mmol/L (ref 135–146)
Total Bilirubin: 0.5 mg/dL (ref 0.2–1.2)
Total Protein: 7 g/dL (ref 6.1–8.1)

## 2020-05-17 ENCOUNTER — Encounter: Payer: Self-pay | Admitting: Family Medicine

## 2020-05-18 ENCOUNTER — Other Ambulatory Visit: Payer: Self-pay | Admitting: Family Medicine

## 2020-05-18 MED ORDER — PANTOPRAZOLE SODIUM 40 MG PO TBEC: DELAYED_RELEASE_TABLET | ORAL | 3 refills | Status: AC

## 2020-05-18 NOTE — Telephone Encounter (Signed)
Pantoprazole refill --- [place], [place] - [address]

## 2020-05-18 NOTE — Telephone Encounter (Signed)
Prescription sent to pharmacy.

## 2020-05-19 LAB — PATHOLOGY REPORT

## 2020-05-19 LAB — TISSUE SPECIMEN

## 2020-05-20 NOTE — Addendum Note (Signed)
Addended by: Vic Blackbird F on: 05/20/2020 09:53 PM   Modules accepted: Orders

## 2020-05-21 ENCOUNTER — Other Ambulatory Visit: Payer: Self-pay | Admitting: *Deleted

## 2020-05-21 ENCOUNTER — Inpatient Hospital Stay: Payer: Medicare HMO | Admitting: Adult Health

## 2020-05-21 MED ORDER — CLOPIDOGREL BISULFATE 75 MG PO TABS
75.0000 mg | ORAL_TABLET | Freq: Every day | ORAL | 3 refills | Status: DC
Start: 2020-05-21 — End: 2021-04-16

## 2020-05-22 ENCOUNTER — Ambulatory Visit: Payer: Self-pay | Admitting: Pharmacist

## 2020-05-22 NOTE — Chronic Care Management (AMB) (Signed)
Chronic Care Management   Follow Up Note   05/22/2020 Name: Kristen [name] MRN: [id number] DOB: 1945-08-15  Referred by: Alycia Rossetti, MD Reason for referral : No chief complaint on file.   Kristen [name] is a 74 y.o. year old female who is a primary care patient of Entiat, Modena Nunnery, MD. The CCM team was consulted for assistance with chronic disease management and care coordination needs.    Review of patient status, including review of consultants reports, relevant laboratory and other test results, and collaboration with appropriate care team members and the patient's provider was performed as part of comprehensive patient evaluation and provision of chronic care management services.    SDOH (Social Determinants of Health) assessments performed: No See Care Plan activities for detailed interventions related to [facility])     Outpatient Encounter Medications as of 05/22/2020  Medication Sig   Accu-Chek Softclix Lancets lancets TEST BLOOD SUGAR THREE TIMES DAILY (NEED MD APPOINTMENT)   amLODipine (NORVASC) 5 MG tablet TAKE 1 TABLET EVERY DAY   aspirin EC 81 MG tablet Take 81 mg by mouth daily.   atorvastatin (LIPITOR) 40 MG tablet TAKE 1 TABLET AT BEDTIME   cholecalciferol (VITAMIN D3) 25 MCG (1000 UNIT) tablet Take 1 tablet (1,000 Units total) by mouth daily.   clopidogrel (PLAVIX) 75 MG tablet Take 1 tablet (75 mg total) by mouth daily with breakfast.   ezetimibe (ZETIA) 10 MG tablet Take 1 tablet (10 mg total) by mouth daily.   FLUoxetine (PROZAC) 40 MG capsule Take 1 capsule (40 mg total) by mouth daily.   fluticasone (FLONASE) 50 MCG/ACT nasal spray USE 2 SPRAYS IN EACH NOSTRIL EVERY DAY AS NEEDED (SINUS ISSUES) (NEED TO CALL THE CLINIC TO SCHEDULE AN APPT FOR REFILLS)   gabapentin (NEURONTIN) 300 MG capsule TAKE ONE CAPSULE BY MOUTH 2 TIMES A DAY   glucose blood (ACCU-CHEK GUIDE) test strip Use as instructed to monitor FSBS 3x daily. Dx: E11.41.   insulin aspart  (NOVOLOG FLEXPEN) 100 UNIT/ML FlexPen Inject 12 units SQ 3x daily with meals   Insulin Pen Needle (UNIFINE PENTIPS) 32G X 4 MM MISC USE TO INJECT TRESIBA AND NOVOLOG EVERY DAY AS DIRECTED   isosorbide mononitrate (IMDUR) 30 MG 24 hr tablet TAKE 1 TABLET (30 MG TOTAL) BY MOUTH DAILY.   lisinopril (ZESTRIL) 2.5 MG tablet Take 1 tablet (2.5 mg total) by mouth daily.   metFORMIN (GLUCOPHAGE) 1000 MG tablet TAKE 1 TABLET TWICE DAILY WITH MEALS   nitroGLYCERIN (NITROSTAT) 0.4 MG SL tablet Place 1 tablet (0.4 mg total) under the tongue every 5 (five) minutes as needed.   oxybutynin (DITROPAN-XL) 5 MG 24 hr tablet TAKE 1 TABLET AT BEDTIME   pantoprazole (PROTONIX) 40 MG tablet TAKE 1 TABLET (40 MG TOTAL) BY MOUTH DAILY AT 6 AM.   TRESIBA FLEXTOUCH 100 UNIT/ML FlexTouch Pen INJECT 62 UNITS INTO THE SKIN AT BEDTIME   No facility-administered encounter medications on file as of 05/22/2020.   Reviewed chart for changes ahead of call for diabetes management and monitoring.  Recent OV: 05/15/2020 - A1c has come down to 8.3, she reports her FBG is between 140-180s, 173 morning of visit, no changes made to medication, patient will switch to [facility] as Dr. Buelah Manis is leaving the practice in February.  Recent Relevant Labs: Lab Results  Component Value Date/Time   HGBA1C 8.3 (H) 05/15/2020 03:39 PM   HGBA1C 9.8 (H) 01/07/2020 12:27 PM   HGBA1C 11.3 (H) 07/22/2016 12:17 PM   HGBA1C  8.7 (H) 12/29/2014 08:44 AM   MICROALBUR 349.2 01/07/2020 12:27 PM   MICROALBUR 47.5 10/16/2018 12:38 PM    Kidney Function Lab Results  Component Value Date/Time   CREATININE 1.02 (H) 05/15/2020 03:39 PM   CREATININE 1.01 (H) 01/07/2020 12:27 PM   CREATININE 1.4 (H) 02/26/2015 10:59 AM   CREATININE 1.1 08/22/2014 11:21 AM   GFRNONAA 51 (L) 02/27/2019 04:02 AM   GFRNONAA 87 06/18/2014 09:54 AM   GFRAA 59 (L) 02/27/2019 04:02 AM   GFRAA >89 06/18/2014 09:54 AM     Current antihyperglycemic regimen:   o Tresiba 100u/ml 65 units hs o Metformin 1000mg  twice daily o Novolog 12 units tid with meals  What recent interventions/DTPs have been made to improve glycemic control:  o None  Have there been any recent hospitalizations or ED visits since last visit with CPP? No  Patient denies hypoglycemic symptoms, including Sweaty, Shaky, Hungry and Nervous/irritable  Patient denies hyperglycemic symptoms, including blurry vision, excessive thirst, fatigue and polyuria  How often are you checking your blood sugar? once daily  What are your blood sugars ranging?  o Fasting: 140-150s  During the week, how often does your blood glucose drop below 70? Never  Are you checking your feet daily/regularly? Patient is aware to monitor her feet.  Adherence Review: Is the patient currently on a STATIN medication? Yes Is the patient currently on ACE/ARB medication? Yes Does the patient have >5 day gap between last estimated fill dates? No  Congratulated patient on her improvement in A1c between the last two readings.  We discussed continuing to monitor carbohydrates.  Patient is switching primary care offices as Dr. Buelah Manis is leaving the practice.  I provided her with the correct number for Dr. Nevada Crane so that she could reach out.  Will continue to monitor her until Dr. Buelah Manis officially leaves practice and she sees her new PCP.  [name], [facility]

## 2020-05-25 ENCOUNTER — Telehealth: Payer: Self-pay

## 2020-05-25 NOTE — Telephone Encounter (Signed)
Please send in the Protonix to the drug store

## 2020-05-27 ENCOUNTER — Other Ambulatory Visit: Payer: Self-pay | Admitting: Internal Medicine

## 2020-05-27 ENCOUNTER — Telehealth: Payer: Self-pay | Admitting: Family Medicine

## 2020-05-27 NOTE — Telephone Encounter (Signed)
Dr.Vesper referral pt see a physician for her arm wasn't sure  the practice and the doctor name please advise pt

## 2020-05-27 NOTE — Telephone Encounter (Signed)
Call placed to patient daughter Arbie Cookey. Reports that phone number for Dr. Nevada Crane [facility] is incorrect.   Given correct number.

## 2020-05-28 NOTE — Telephone Encounter (Signed)
This is a [facility] pt °

## 2020-06-05 ENCOUNTER — Other Ambulatory Visit: Payer: Self-pay | Admitting: Family Medicine

## 2020-06-09 ENCOUNTER — Encounter: Payer: Self-pay | Admitting: Adult Health

## 2020-06-09 ENCOUNTER — Other Ambulatory Visit: Payer: Self-pay

## 2020-06-09 ENCOUNTER — Inpatient Hospital Stay: Payer: Medicare HMO | Attending: Adult Health | Admitting: Adult Health

## 2020-06-09 VITALS — BP 146/71 | HR 94 | Temp 98.5°F | Resp 17 | Ht 62.0 in | Wt 204.8 lb

## 2020-06-09 DIAGNOSIS — Z79899 Other long term (current) drug therapy: Secondary | ICD-10-CM | POA: Insufficient documentation

## 2020-06-09 DIAGNOSIS — Z9012 Acquired absence of left breast and nipple: Secondary | ICD-10-CM | POA: Insufficient documentation

## 2020-06-09 DIAGNOSIS — I10 Essential (primary) hypertension: Secondary | ICD-10-CM | POA: Insufficient documentation

## 2020-06-09 DIAGNOSIS — C50412 Malignant neoplasm of upper-outer quadrant of left female breast: Secondary | ICD-10-CM | POA: Diagnosis not present

## 2020-06-09 DIAGNOSIS — Z794 Long term (current) use of insulin: Secondary | ICD-10-CM | POA: Insufficient documentation

## 2020-06-09 DIAGNOSIS — Z79811 Long term (current) use of aromatase inhibitors: Secondary | ICD-10-CM | POA: Diagnosis not present

## 2020-06-09 DIAGNOSIS — Z7982 Long term (current) use of aspirin: Secondary | ICD-10-CM | POA: Diagnosis not present

## 2020-06-09 DIAGNOSIS — Z170 Estrogen receptor positive status [ER+]: Secondary | ICD-10-CM | POA: Insufficient documentation

## 2020-06-09 DIAGNOSIS — E1140 Type 2 diabetes mellitus with diabetic neuropathy, unspecified: Secondary | ICD-10-CM | POA: Insufficient documentation

## 2020-06-09 DIAGNOSIS — K219 Gastro-esophageal reflux disease without esophagitis: Secondary | ICD-10-CM | POA: Insufficient documentation

## 2020-06-09 DIAGNOSIS — F418 Other specified anxiety disorders: Secondary | ICD-10-CM | POA: Insufficient documentation

## 2020-06-09 DIAGNOSIS — Z8673 Personal history of transient ischemic attack (TIA), and cerebral infarction without residual deficits: Secondary | ICD-10-CM | POA: Insufficient documentation

## 2020-06-09 DIAGNOSIS — Z1231 Encounter for screening mammogram for malignant neoplasm of breast: Secondary | ICD-10-CM

## 2020-06-09 DIAGNOSIS — E785 Hyperlipidemia, unspecified: Secondary | ICD-10-CM | POA: Diagnosis not present

## 2020-06-09 NOTE — Progress Notes (Signed)
CLINIC:  Survivorship   REASON FOR VISIT:  Routine follow-up for history of breast cancer.   BRIEF ONCOLOGIC HISTORY:  Oncology History  Malignant neoplasm of upper-outer quadrant of left female breast ([place])  11/30/2012 Initial Diagnosis   Malignant neoplasm of upper-outer quadrant of left female breast: Left breast bloody nipple discharge that led to mammogram. Initial biopsy revealed DCIS ER/PR positive   12/24/2012 Surgery   Left mastectomy with sentinel lymph node biopsy. Invasive ductal carcinoma grade 2 with 2 foci each measuring less than 0.1 cm with high-grade DCIS 2.6 cm and 1.4 cm 6 sentinel nodes negative ER 100% PR 100% HER-2 negative Ki-67 14%   01/31/2013 - 03/27/2018 Anti-estrogen oral therapy   Aromasin 25 mg once daily      INTERVAL HISTORY:  Kristen [name] presents to the [facility] today for routine follow-up for her history of breast cancer.  Overall, she reports feeling quite well.   She notes she has intermittent pain under her left arm that happens briefly about once per week.  This is improving.  She notes some intermittent pain in her right breast.    She sees Dr. Buelah Manis, her PCP regularly.  She had her skin checked and had a place on her right forearm removed by Dr. Buelah Manis.  She has f/u with dermatology for further management as they need wider margins.  She is going to see Dr. Nevada Crane for this.  She is up to date with colon cancer screen.  She underwent a right breast mammogram in 08/2019 that showed no evidence of malignancy and also underwent bone density testing at that time which was normal.  She is overall doing well.    Stefana exercises by walking outdoors.  She avoids doing this when it is too cold.    REVIEW OF SYSTEMS:  Review of Systems  Constitutional: Negative for appetite change, chills, fatigue, fever and unexpected weight change.  HENT:   Negative for hearing loss, lump/mass and trouble swallowing.   Eyes: Negative for eye problems and  icterus.  Respiratory: Negative for chest tightness, cough and shortness of breath.   Cardiovascular: Negative for chest pain, leg swelling and palpitations.  Gastrointestinal: Negative for abdominal distention, abdominal pain, constipation, diarrhea, nausea and vomiting.  Endocrine: Negative for hot flashes.  Genitourinary: Negative for difficulty urinating.   Musculoskeletal: Negative for arthralgias.  Skin: Negative for itching and rash.  Neurological: Negative for dizziness, extremity weakness, headaches and numbness.  Hematological: Negative for adenopathy. Does not bruise/bleed easily.  Psychiatric/Behavioral: Negative for depression. The patient is not nervous/anxious.   Breast: Denies any new nodularity, masses, tenderness, nipple changes, or nipple discharge.       PAST MEDICAL/SURGICAL HISTORY:  Past Medical History:  Diagnosis Date   Anemia    Anxiety    Arthritis    Bifascicular block    Blood transfusion without reported diagnosis    CAD (coronary artery disease)    DES to LAD 02/2019   Depression    Diabetic neuropathy (HCC)    Essential hypertension    GERD (gastroesophageal reflux disease)    Hemorrhoids    History of stroke    a. MRI 12/2016 - remote left cerebellar infarcts incidentally noted.   Hyperlipidemia    Malignant neoplasm of upper-outer quadrant of left female breast ([place]) 11/30/2012   Nephrolithiasis    Obesity    Overactive bladder    Personal history of colonic polyps    09/2010 - 2 diminutive adenomas 02/12/2016 5 mm  descending polyp - prolapse type polyp (not precancerous) no recall needed given hx, findings and age    S/P placement of cardiac pacemaker 01/03/2017   Type 2 diabetes mellitus ([place])    Vitamin D deficiency    Past Surgical History:  Procedure Laterality Date   APPENDECTOMY     BACK SURGERY     BREAST SURGERY     CARDIAC CATHETERIZATION  01/2009   cath by Dr Lia Foyer revealed nonobstructive CAD    [place], LAPAROSCOPIC     COLONOSCOPY     last 2012- with polyps   CORONARY STENT INTERVENTION N/A 02/26/2019   Procedure: CORONARY STENT INTERVENTION;  Surgeon: Jettie Booze, MD;  Location: [facility];  Service: Cardiovascular;  Laterality: N/A;   INTRAVASCULAR PRESSURE WIRE/FFR STUDY N/A 02/26/2019   Procedure: INTRAVASCULAR PRESSURE WIRE/FFR STUDY;  Surgeon: Jettie Booze, MD;  Location: [facility];  Service: Cardiovascular;  Laterality: N/A;   JOINT REPLACEMENT     LEFT HEART CATH AND CORONARY ANGIOGRAPHY N/A 02/26/2019   Procedure: LEFT HEART CATH AND CORONARY ANGIOGRAPHY;  Surgeon: Jettie Booze, MD;  Location: [facility];  Service: Cardiovascular;  Laterality: N/A;   LITHOTRIPSY     MASTECTOMY Left 2014   MASTECTOMY W/ SENTINEL NODE BIOPSY Left 12/24/2012   Procedure: LEFT MASTECTOMY WITH LEFT SENTINEL LYMPH NODE BIOPSY;  Surgeon: Rolm Bookbinder, MD;  Location: [facility];  Service: General;  Laterality: Left;   OOPHORECTOMY Right    1.5  ovaries rem   PACEMAKER IMPLANT N/A 12/19/2016   Procedure: Pacemaker Implant;  Surgeon: Deboraha Sprang, MD;  Location: [facility];  Service: Cardiovascular;  Laterality: N/A;   PARTIAL HIP ARTHROPLASTY     left hip   POLYPECTOMY     REPLACEMENT TOTAL KNEE     both knees   TONSILLECTOMY     TOTAL MASTECTOMY Left 12/24/2012   Dr Donne Hazel     ALLERGIES:  Allergies  Allergen Reactions   Sulfonamide Derivatives Hives     CURRENT MEDICATIONS:  Outpatient Encounter Medications as of 06/09/2020  Medication Sig   Accu-Chek Softclix Lancets lancets TEST BLOOD SUGAR THREE TIMES DAILY (NEED MD APPOINTMENT)   amLODipine (NORVASC) 5 MG tablet TAKE 1 TABLET EVERY DAY   aspirin EC 81 MG tablet Take 81 mg by mouth daily.   atorvastatin (LIPITOR) 40 MG tablet TAKE 1 TABLET AT BEDTIME   cholecalciferol (VITAMIN D3) 25 MCG (1000  UNIT) tablet Take 1 tablet (1,000 Units total) by mouth daily.   clopidogrel (PLAVIX) 75 MG tablet Take 1 tablet (75 mg total) by mouth daily with breakfast.   ezetimibe (ZETIA) 10 MG tablet Take 1 tablet (10 mg total) by mouth daily.   FLUoxetine (PROZAC) 40 MG capsule Take 1 capsule (40 mg total) by mouth daily.   fluticasone (FLONASE) 50 MCG/ACT nasal spray USE 2 SPRAYS IN EACH NOSTRIL EVERY DAY AS NEEDED (SINUS ISSUES) (NEED TO CALL THE CLINIC TO SCHEDULE AN APPT FOR REFILLS)   gabapentin (NEURONTIN) 300 MG capsule TAKE ONE CAPSULE BY MOUTH 2 TIMES A DAY   glucose blood (ACCU-CHEK GUIDE) test strip Use as instructed to monitor FSBS 3x daily. Dx: E11.41.   insulin aspart (NOVOLOG FLEXPEN) 100 UNIT/ML FlexPen Inject 12 units SQ 3x daily with meals   Insulin Pen Needle (UNIFINE PENTIPS) 32G X 4 MM MISC USE TO INJECT TRESIBA AND NOVOLOG EVERY DAY AS DIRECTED  isosorbide mononitrate (IMDUR) 30 MG 24 hr tablet TAKE 1 TABLET (30 MG TOTAL) BY MOUTH DAILY.   lisinopril (ZESTRIL) 2.5 MG tablet Take 1 tablet (2.5 mg total) by mouth daily.   metFORMIN (GLUCOPHAGE) 1000 MG tablet TAKE 1 TABLET TWICE DAILY WITH MEALS   nitroGLYCERIN (NITROSTAT) 0.4 MG SL tablet Place 1 tablet (0.4 mg total) under the tongue every 5 (five) minutes as needed.   oxybutynin (DITROPAN-XL) 5 MG 24 hr tablet TAKE 1 TABLET AT BEDTIME   pantoprazole (PROTONIX) 40 MG tablet TAKE 1 TABLET (40 MG TOTAL) BY MOUTH DAILY AT 6 AM.   TRESIBA FLEXTOUCH 100 UNIT/ML FlexTouch Pen INJECT 62 UNITS INTO THE SKIN AT BEDTIME   No facility-administered encounter medications on file as of 06/09/2020.     ONCOLOGIC FAMILY HISTORY:  Family History  Problem Relation Age of Onset   Cancer Mother        mouth cancer   Breast cancer Mother        possibly dx in her 4s   Lung cancer Brother 58   Lung cancer Brother    Lung cancer Brother    Heart attack Father 24   Breast cancer Sister    Brain cancer Brother     Colon cancer Neg Hx    Colon polyps Neg Hx    Rectal cancer Neg Hx    Stomach cancer Neg Hx    Esophageal cancer Neg Hx     GENETIC COUNSELING/TESTING: Not at this time  SOCIAL HISTORY:  Social History   Socioeconomic History   Marital status: Widowed    Spouse name: Not on file   Number of children: 2   Years of education: Not on file   Highest education level: Not on file  Occupational History   Not on file  Tobacco Use   Smoking status: Never Smoker   Smokeless tobacco: Never Used  Vaping Use   Vaping Use: Never used  Substance and Sexual Activity   Alcohol use: No   Drug use: No   Sexual activity: Not on file  Other Topics Concern   Not on file  Social History Narrative   Lives in [place] with daughter.   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  Unaccompanied today.   HEENT: Head is normocephalic.  Pupils equal and reactive to light. Conjunctivae clear without exudate.  Sclerae anicteric. Oral mucosa is pink, moist.  Oropharynx is pink without lesions or erythema.  Lymph: No cervical, supraclavicular, or infraclavicular lymphadenopathy noted on palpation.  Cardiovascular: Regular rate and rhythm.Marland Kitchen Respiratory: Clear to auscultation bilaterally. Chest expansion symmetric; breathing non-labored.  Breast Exam:  -Left breast: s/p mastectomy, no sign of local recurrence -Right  breast: No appreciable masses on palpation. No skin redness, thickening, or peau d'orange appearance; no nipple retraction or nipple discharge. -Axilla: No axillary  adenopathy bilaterally.  GI: Abdomen soft and round; non-tender, non-distended. Bowel sounds normoactive. No hepatosplenomegaly.   GU: Deferred.  Neuro: No focal deficits. Steady gait.  Psych: Mood and affect normal and appropriate for situation.  MSK: No focal spinal tenderness to palpation, full range of motion in bilateral upper extremities Extremities: No edema. Skin: Warm and dry.  LABORATORY DATA:  None for this visit   DIAGNOSTIC IMAGING:  Most recent mammogram: overdue by 3 years    ASSESSMENT AND PLAN:  Ms.. [name] is a pleasant 74 y.o. female with history of Stage IA left breast invasive ductal carcinoma, ER+/PR+/HER2-, diagnosed in 2014, treated with mastectomy, and anti-estrogen therapy with Aromasin x 5 years completing therapy in 03/2018.  [facility] for surveillance and routine follow-up.   1. History of breast cancer:  Ms. Gatling is currently clinically and radiographically without evidence of disease or recurrence of breast cancer. Shaniya is due for repeat mammogram in 08/2020; orders placed today. She will return in 1 year for follow up.  I encouraged her to call me with any questions or concerns before her next visit at the cancer center, and I would be happy to see her sooner, if needed.    2. Bone health:  Given Ms. Marczak's age, history of breast cancer, and her previous anti-estrogen therapy with Aromasin, she is at risk for bone demineralization.  Her most recent bone density test was normal.  She was given education on specific food and activities to promote bone health.  3. Cancer screening:  Due to Ms. Frane's history and her age, she should receive screening for skin cancers. She was encouraged to follow-up with her PCP for appropriate cancer screenings.   4. Health  maintenance and wellness promotion: Ms. Ober was encouraged to consume 5-7 servings of fruits and vegetables per day. She was also encouraged to engage in moderate exercise for 30 minutes per day as she is able most days of the week. She was instructed to limit her alcohol consumption and continue to abstain from tobacco use.    Dispo:  -Return to cancer center in one year for LTS follow up -Mammogram in 08/2020  Total encounter time: 20 minutes*  Wilber Bihari, NP 06/09/20 9:46 AM Medical Oncology and Hematology [facility] [place], [place] [address] Tel. [phone]    Fax. [phone]  *Total Encounter Time as defined by the Centers for Medicare and Medicaid Services includes, in addition to the face-to-face time of a patient visit (documented in the note above) non-face-to-face time: obtaining and reviewing outside history, ordering and reviewing medications, tests or procedures, care coordination (communications with other health care professionals or caregivers) and documentation in the medical record.   Note: PRIMARY CARE PROVIDER [place], Modena Nunnery, [place] [phone]

## 2020-06-10 ENCOUNTER — Other Ambulatory Visit: Payer: Self-pay

## 2020-06-10 ENCOUNTER — Telehealth: Payer: Self-pay | Admitting: Family Medicine

## 2020-06-10 ENCOUNTER — Telehealth: Payer: Self-pay | Admitting: Adult Health

## 2020-06-10 DIAGNOSIS — E1143 Type 2 diabetes mellitus with diabetic autonomic (poly)neuropathy: Secondary | ICD-10-CM

## 2020-06-10 DIAGNOSIS — E1149 Type 2 diabetes mellitus with other diabetic neurological complication: Secondary | ICD-10-CM

## 2020-06-10 MED ORDER — NOVOLOG FLEXPEN 100 UNIT/ML ~~LOC~~ SOPN: PEN_INJECTOR | SUBCUTANEOUS | 3 refills | Status: DC

## 2020-06-10 NOTE — Telephone Encounter (Signed)
Scheduled appts per 12/28 los. Unable to leave voicemail. Mailed appt reminder and calendar.

## 2020-06-10 NOTE — Telephone Encounter (Signed)
Patient requested a refill on her insulin aspart (NOVOLOG FLEXPEN) 100 UNIT/ML FlexPen Called into [facility].  CB# [phone]

## 2020-06-10 NOTE — Telephone Encounter (Signed)
Medication sent to [facility] 

## 2020-06-22 DIAGNOSIS — D0461 Carcinoma in situ of skin of right upper limb, including shoulder: Secondary | ICD-10-CM | POA: Diagnosis not present

## 2020-06-22 DIAGNOSIS — D485 Neoplasm of uncertain behavior of skin: Secondary | ICD-10-CM | POA: Diagnosis not present

## 2020-06-22 DIAGNOSIS — X32XXXA Exposure to sunlight, initial encounter: Secondary | ICD-10-CM | POA: Diagnosis not present

## 2020-06-22 DIAGNOSIS — L570 Actinic keratosis: Secondary | ICD-10-CM | POA: Diagnosis not present

## 2020-06-22 DIAGNOSIS — D0362 Melanoma in situ of left upper limb, including shoulder: Secondary | ICD-10-CM | POA: Diagnosis not present

## 2020-06-24 ENCOUNTER — Ambulatory Visit (INDEPENDENT_AMBULATORY_CARE_PROVIDER_SITE_OTHER): Payer: Medicare PPO

## 2020-06-24 DIAGNOSIS — R55 Syncope and collapse: Secondary | ICD-10-CM

## 2020-06-24 LAB — CUP PACEART REMOTE DEVICE CHECK
Date Time Interrogation Session: 20220112075054
Implantable Lead Implant Date: 20180709
Implantable Lead Implant Date: 20180709
Implantable Lead Location: 753859
Implantable Lead Location: 753860
Implantable Lead Model: 5076
Implantable Lead Model: 5076
Implantable Pulse Generator Implant Date: 20180709
Pulse Gen Model: 407145
Pulse Gen Serial Number: 69106657

## 2020-06-26 DIAGNOSIS — Z96649 Presence of unspecified artificial hip joint: Secondary | ICD-10-CM | POA: Diagnosis not present

## 2020-06-26 DIAGNOSIS — I25119 Atherosclerotic heart disease of native coronary artery with unspecified angina pectoris: Secondary | ICD-10-CM | POA: Diagnosis not present

## 2020-06-26 DIAGNOSIS — F3342 Major depressive disorder, recurrent, in full remission: Secondary | ICD-10-CM | POA: Diagnosis not present

## 2020-06-26 DIAGNOSIS — Z950 Presence of cardiac pacemaker: Secondary | ICD-10-CM | POA: Diagnosis not present

## 2020-06-26 DIAGNOSIS — E785 Hyperlipidemia, unspecified: Secondary | ICD-10-CM | POA: Diagnosis not present

## 2020-06-26 DIAGNOSIS — Z794 Long term (current) use of insulin: Secondary | ICD-10-CM | POA: Diagnosis not present

## 2020-06-26 DIAGNOSIS — E1151 Type 2 diabetes mellitus with diabetic peripheral angiopathy without gangrene: Secondary | ICD-10-CM | POA: Diagnosis not present

## 2020-06-26 DIAGNOSIS — E1165 Type 2 diabetes mellitus with hyperglycemia: Secondary | ICD-10-CM | POA: Diagnosis not present

## 2020-06-26 DIAGNOSIS — E1142 Type 2 diabetes mellitus with diabetic polyneuropathy: Secondary | ICD-10-CM | POA: Diagnosis not present

## 2020-06-26 DIAGNOSIS — G8929 Other chronic pain: Secondary | ICD-10-CM | POA: Diagnosis not present

## 2020-07-07 NOTE — Progress Notes (Signed)
Remote pacemaker transmission.   

## 2020-07-10 ENCOUNTER — Other Ambulatory Visit: Payer: Self-pay | Admitting: Family Medicine

## 2020-07-14 ENCOUNTER — Ambulatory Visit (INDEPENDENT_AMBULATORY_CARE_PROVIDER_SITE_OTHER): Payer: Medicare PPO | Admitting: Pharmacist

## 2020-07-14 DIAGNOSIS — I10 Essential (primary) hypertension: Secondary | ICD-10-CM | POA: Diagnosis not present

## 2020-07-14 DIAGNOSIS — E1149 Type 2 diabetes mellitus with other diabetic neurological complication: Secondary | ICD-10-CM

## 2020-07-14 DIAGNOSIS — E782 Mixed hyperlipidemia: Secondary | ICD-10-CM | POA: Diagnosis not present

## 2020-07-14 NOTE — Patient Instructions (Addendum)
Visit Information  Goals Addressed            This Visit's Progress   . Pharmacy Care Plan:       CARE PLAN ENTRY (see longitudinal plan of care for additional care plan information)  Current Barriers:  . Chronic Disease Management support, education, and care coordination needs related to Hypertension, Hyperlipidemia, Diabetes, and Coronary Artery Disease   Hypertension BP Readings from Last 3 Encounters:  06/09/20 (!) 146/71  05/15/20 136/82  02/10/20 138/84   . Pharmacist Clinical Goal(s): o Over the next 120 days, patient will work with PharmD and providers to maintain BP goal <130/80 . Current regimen:  o Amlodipine 5mg  daily o Isosorbide 30mg  daily o Lisinopril 2.5mg  . Interventions: o Reviewed most recent BP readings o Reviewed home monitoring procedure o Monitoring plan initiated . Patient self care activities - Over the next 90 days, patient will: o Check BP daily, document, and provide at future appointments o Ensure daily salt intake < 2300 mg/day o Contact providers with consistent readings > 130/80  Hyperlipidemia Lab Results  Component Value Date/Time   LDLCALC 64 05/15/2020 03:39 PM   . Pharmacist Clinical Goal(s): o Over the next 120 days, patient will work with PharmD and providers to maintain LDL goal < 70 . Current regimen:  o Atorvastatin 40mg  daily o Zetia 10mg  daily . Interventions: o Reviewed most recent lipid panel o Discussed dietary changes patient could make to bring down TG . Patient self care activities - Over the next 120 days, patient will: o Work on dietary changes such as limiting saturated fats and sugary foods o Continue to focus on medication adherence by pill count  Diabetes Lab Results  Component Value Date/Time   HGBA1C 8.3 (H) 05/15/2020 03:39 PM   HGBA1C 9.8 (H) 01/07/2020 12:27 PM   HGBA1C 11.3 (H) 07/22/2016 12:17 PM   HGBA1C 8.7 (H) 12/29/2014 08:44 AM   . Pharmacist Clinical Goal(s): o Over the next 120 days,  patient will work with PharmD and providers to achieve A1c goal <7% . Current regimen:  o Tresiba 62 units hs o Metformin 1000mg  twice daily o Novolog 12 units three times per day with meals . Interventions: o Reviewed home blood sugar logs o Reviewed most recent A1c result o Congratulated on improvement over last few visits o Counseled on regular meals to avoid hypoglycemia . Patient self care activities - Over the next 120 days, patient will: o Check blood sugar 3-4 times daily, document, and provide at future appointments o Contact provider with any episodes of hypoglycemia (Fasting sugar < 80) o Work on hard on dietary changes provided to bring down glucose levels  Please see past updates related to this goal by clicking on the "Past Updates" button in the selected goal         The patient verbalized understanding of instructions, educational materials, and care plan provided today and agreed to receive a mailed copy of patient instructions, educational materials, and care plan.   Telephone follow up appointment with pharmacy team member scheduled for: 4 months  [name], [facility]  Managing Your Hypertension Hypertension, also called high blood pressure, is when the force of the blood pressing against the walls of the arteries is too strong. Arteries are blood vessels that carry blood from your heart throughout your body. Hypertension forces the heart to work harder to pump blood and may cause the arteries to become narrow or stiff. Understanding blood pressure readings Your personal target  blood pressure may vary depending on your medical conditions, your age, and other factors. A blood pressure reading includes a higher number over a lower number. Ideally, your blood pressure should be below 120/80. You should know that:  The first, or top, number is called the systolic pressure. It is a measure of the pressure in your arteries as your heart beats.  The second, or bottom  number, is called the diastolic pressure. It is a measure of the pressure in your arteries as the heart relaxes. Blood pressure is classified into four stages. Based on your blood pressure reading, your health care provider may use the following stages to determine what type of treatment you need, if any. Systolic pressure and diastolic pressure are measured in a unit called mmHg. Normal  Systolic pressure: below 120.  Diastolic pressure: below 80. Elevated  Systolic pressure: 120-129.  Diastolic pressure: below 80. Hypertension stage 1  Systolic pressure: 130-139.  Diastolic pressure: 80-89. Hypertension stage 2  Systolic pressure: 140 or above.  Diastolic pressure: 90 or above. How can this condition affect me? Managing your hypertension is an important responsibility. Over time, hypertension can damage the arteries and decrease blood flow to important parts of the body, including the brain, heart, and kidneys. Having untreated or uncontrolled hypertension can lead to:  A heart attack.  A stroke.  A weakened blood vessel (aneurysm).  Heart failure.  Kidney damage.  Eye damage.  Metabolic syndrome.  Memory and concentration problems.  Vascular dementia. What actions can I take to manage this condition? Hypertension can be managed by making lifestyle changes and possibly by taking medicines. Your health care provider will help you make a plan to bring your blood pressure within a normal range. Nutrition  Eat a diet that is high in fiber and potassium, and low in salt (sodium), added sugar, and fat. An example eating plan is called the Dietary Approaches to Stop Hypertension (DASH) diet. To eat this way: ? Eat plenty of fresh fruits and vegetables. Try to fill one-half of your plate at each meal with fruits and vegetables. ? Eat whole grains, such as whole-wheat pasta, brown rice, or whole-grain bread. Fill about one-fourth of your plate with whole grains. ? Eat  low-fat dairy products. ? Avoid fatty cuts of meat, processed or cured meats, and poultry with skin. Fill about one-fourth of your plate with lean proteins such as fish, chicken without skin, beans, eggs, and tofu. ? Avoid pre-made and processed foods. These tend to be higher in sodium, added sugar, and fat.  Reduce your daily sodium intake. Most people with hypertension should eat less than 1,500 mg of sodium a day.   Lifestyle  Work with your health care provider to maintain a healthy body weight or to lose weight. Ask what an ideal weight is for you.  Get at least 30 minutes of exercise that causes your heart to beat faster (aerobic exercise) most days of the week. Activities may include walking, swimming, or biking.  Include exercise to strengthen your muscles (resistance exercise), such as weight lifting, as part of your weekly exercise routine. Try to do these types of exercises for 30 minutes at least 3 days a week.  Do not use any products that contain nicotine or tobacco, such as cigarettes, e-cigarettes, and chewing tobacco. If you need help quitting, ask your health care provider.  Control any long-term (chronic) conditions you have, such as high cholesterol or diabetes.  Identify your sources of stress and find  ways to manage stress. This may include meditation, deep breathing, or making time for fun activities.   Alcohol use  Do not drink alcohol if: ? Your health care provider tells you not to drink. ? You are pregnant, may be pregnant, or are planning to become pregnant.  If you drink alcohol: ? Limit how much you use to:  0-1 drink a day for women.  0-2 drinks a day for men. ? Be aware of how much alcohol is in your drink. In the [place] one drink equals one 12 oz bottle of beer (355 mL), one 5 oz glass of wine (148 mL), or one 1 oz glass of hard liquor (44 mL). Medicines Your health care provider may prescribe medicine if lifestyle changes are not enough to get your  blood pressure under control and if:  Your systolic blood pressure is 130 or higher.  Your diastolic blood pressure is 80 or higher. Take medicines only as told by your health care provider. Follow the directions carefully. Blood pressure medicines must be taken as told by your health care provider. The medicine does not work as well when you skip doses. Skipping doses also puts you at risk for problems. Monitoring Before you monitor your blood pressure:  Do not smoke, drink caffeinated beverages, or exercise within 30 minutes before taking a measurement.  Use the bathroom and empty your bladder (urinate).  Sit quietly for at least 5 minutes before taking measurements. Monitor your blood pressure at home as told by your health care provider. To do this:  Sit with your back straight and supported.  Place your feet flat on the floor. Do not cross your legs.  Support your arm on a flat surface, such as a table. Make sure your upper arm is at heart level.  Each time you measure, take two or three readings one minute apart and record the results. You may also need to have your blood pressure checked regularly by your health care provider.   General information  Talk with your health care provider about your diet, exercise habits, and other lifestyle factors that may be contributing to hypertension.  Review all the medicines you take with your health care provider because there may be side effects or interactions.  Keep all visits as told by your health care provider. Your health care provider can help you create and adjust your plan for managing your high blood pressure. Where to find more information  National Heart, Lung, and Blood Institute: PopSteam.is  American Heart Association: www.heart.org Contact a health care provider if:  You think you are having a reaction to medicines you have taken.  You have repeated (recurrent) headaches.  You feel dizzy.  You have  swelling in your ankles.  You have trouble with your vision. Get help right away if:  You develop a severe headache or confusion.  You have unusual weakness or numbness, or you feel faint.  You have severe pain in your chest or abdomen.  You vomit repeatedly.  You have trouble breathing. These symptoms may represent a serious problem that is an emergency. Do not wait to see if the symptoms will go away. Get medical help right away. Call your local emergency services (911 in the [place] Do not drive yourself to the hospital. Summary  Hypertension is when the force of blood pumping through your arteries is too strong. If this condition is not controlled, it may put you at risk for serious complications.  Your personal target blood  pressure may vary depending on your medical conditions, your age, and other factors. For most people, a normal blood pressure is less than 120/80.  Hypertension is managed by lifestyle changes, medicines, or both.  Lifestyle changes to help manage hypertension include losing weight, eating a healthy, low-sodium diet, exercising more, stopping smoking, and limiting alcohol. This information is not intended to replace advice given to you by your health care provider. Make sure you discuss any questions you have with your health care provider. Document Revised: 07/05/2019 Document Reviewed: 04/30/2019 Elsevier Patient Education  2021 [employer].

## 2020-07-14 NOTE — Chronic Care Management (AMB) (Signed)
Chronic Care Management Pharmacy  Name: Kristen [name]  MRN: [id number] DOB: 11-14-1945  Chief Complaint/ HPI  [facility],  75 y.o. , female presents for their Follow-Up CCM visit with the clinical pharmacist via telephone.  PCP : Alycia Rossetti, MD  Their chronic conditions include: hypertension, CAD,  Type II DM, hyperlipidemia, depression/anxiety, vitamin D deficiency.  Office Visits: 01/07/2020 [facility]) - mobility exam  Recurrent falls, difficulty walking  Required scooter in stores and some help with ADL's  09/24/2019 [facility]) - follow-up  Does need help with ADL's cooking and cleaning  Diabetes remains uncontrolled Consult Visit:  10/01/2019 ([facility], Cardio) - no changes made  Continue ASA, Plavix, Lipitor, Zetia, Norvasc, and Imdur.  Medications: Outpatient Encounter Medications as of 07/14/2020  Medication Sig  . ACCU-CHEK AVIVA PLUS test strip USE AS DIRECTED TO MONITOR FINGERSTICK BLOOD SUGAR THREE TIMES DAILY  . Accu-Chek Softclix Lancets lancets TEST BLOOD SUGAR THREE TIMES DAILY (NEED MD APPOINTMENT)  . amLODipine (NORVASC) 5 MG tablet TAKE 1 TABLET EVERY DAY  . aspirin EC 81 MG tablet Take 81 mg by mouth daily.  Marland Kitchen atorvastatin (LIPITOR) 40 MG tablet TAKE 1 TABLET AT BEDTIME  . cholecalciferol (VITAMIN D3) 25 MCG (1000 UNIT) tablet Take 1 tablet (1,000 Units total) by mouth daily.  . clopidogrel (PLAVIX) 75 MG tablet Take 1 tablet (75 mg total) by mouth daily with breakfast.  . ezetimibe (ZETIA) 10 MG tablet Take 1 tablet (10 mg total) by mouth daily.  Marland Kitchen FLUoxetine (PROZAC) 40 MG capsule Take 1 capsule (40 mg total) by mouth daily.  . fluticasone (FLONASE) 50 MCG/ACT nasal spray USE 2 SPRAYS IN EACH NOSTRIL EVERY DAY AS NEEDED (SINUS ISSUES) (NEED TO CALL THE CLINIC TO SCHEDULE AN APPT FOR REFILLS)  . gabapentin (NEURONTIN) 300 MG capsule TAKE ONE CAPSULE BY MOUTH 2 TIMES A DAY  . insulin aspart (NOVOLOG FLEXPEN) 100 UNIT/ML FlexPen Inject 12 units SQ  3x daily with meals  . Insulin Pen Needle (UNIFINE PENTIPS) 32G X 4 MM MISC USE TO INJECT TRESIBA AND NOVOLOG EVERY DAY AS DIRECTED  . isosorbide mononitrate (IMDUR) 30 MG 24 hr tablet TAKE 1 TABLET (30 MG TOTAL) BY MOUTH DAILY.  Marland Kitchen lisinopril (ZESTRIL) 2.5 MG tablet Take 1 tablet (2.5 mg total) by mouth daily.  . metFORMIN (GLUCOPHAGE) 1000 MG tablet TAKE 1 TABLET TWICE DAILY WITH MEALS  . nitroGLYCERIN (NITROSTAT) 0.4 MG SL tablet Place 1 tablet (0.4 mg total) under the tongue every 5 (five) minutes as needed.  Marland Kitchen oxybutynin (DITROPAN-XL) 5 MG 24 hr tablet TAKE 1 TABLET AT BEDTIME  . pantoprazole (PROTONIX) 40 MG tablet TAKE 1 TABLET (40 MG TOTAL) BY MOUTH DAILY AT 6 AM.  . TRESIBA FLEXTOUCH 100 UNIT/ML FlexTouch Pen INJECT 62 UNITS INTO THE SKIN AT BEDTIME   No facility-administered encounter medications on file as of 07/14/2020.     Current Diagnosis/Assessment: Emergency planning/management officer Strain: Low Risk   . Difficulty of Paying Living Expenses: Not very hard    Goals Addressed            This Visit's Progress   . Pharmacy Care Plan:       CARE PLAN ENTRY (see longitudinal plan of care for additional care plan information)  Current Barriers:  . Chronic Disease Management support, education, and care coordination needs related to Hypertension, Hyperlipidemia, Diabetes, and Coronary Artery Disease   Hypertension BP Readings from Last 3 Encounters:  06/09/20 (!) 146/71  05/15/20 136/82  02/10/20 138/84   .  Pharmacist Clinical Goal(s): o Over the next 120 days, patient will work with PharmD and providers to maintain BP goal <130/80 . Current regimen:  o Amlodipine 46m daily o Isosorbide 39mdaily o Lisinopril 2.78m109m Interventions: o Reviewed most recent BP readings o Reviewed home monitoring procedure o Monitoring plan initiated . Patient self care activities - Over the next 90 days, patient will: o Check BP daily, document, and provide at future appointments o Ensure daily  salt intake < 2300 mg/day o Contact providers with consistent readings > 130/80  Hyperlipidemia Lab Results  Component Value Date/Time   LDLCALC 64 05/15/2020 03:39 PM   . Pharmacist Clinical Goal(s): o Over the next 120 days, patient will work with PharmD and providers to maintain LDL goal < 70 . Current regimen:  o Atorvastatin 74m99mily o Zetia 10mg41mly . Interventions: o Reviewed most recent lipid panel o Discussed dietary changes patient could make to bring down TG . Patient self care activities - Over the next 120 days, patient will: o Work on dietary changes such as limiting saturated fats and sugary foods o Continue to focus on medication adherence by pill count  Diabetes Lab Results  Component Value Date/Time   HGBA1C 8.3 (H) 05/15/2020 03:39 PM   HGBA1C 9.8 (H) 01/07/2020 12:27 PM   HGBA1C 11.3 (H) 07/22/2016 12:17 PM   HGBA1C 8.7 (H) 12/29/2014 08:44 AM   . Pharmacist Clinical Goal(s): o Over the next 120 days, patient will work with PharmD and providers to achieve A1c goal <7% . Current regimen:  o Tresiba 62 units hs o Metformin 1000mg 32me daily o Novolog 12 units three times per day with meals . Interventions: o Reviewed home blood sugar logs o Reviewed most recent A1c result o Congratulated on improvement over last few visits o Counseled on regular meals to avoid hypoglycemia . Patient self care activities - Over the next 120 days, patient will: o Check blood sugar 3-4 times daily, document, and provide at future appointments o Contact provider with any episodes of hypoglycemia (Fasting sugar < 80) o Work on hard on dietary changes provided to bring down glucose levels  Please see past updates related to this goal by clicking on the "Past Updates" button in the selected goal         Diabetes   A1c goal <7%  Recent Relevant Labs: Lab Results  Component Value Date/Time   HGBA1C 8.3 (H) 05/15/2020 03:39 PM   HGBA1C 9.8 (H) 01/07/2020 12:27 PM    HGBA1C 11.3 (H) 07/22/2016 12:17 PM   HGBA1C 8.7 (H) 12/29/2014 08:44 AM   MICROALBUR 349.2 01/07/2020 12:27 PM   MICROALBUR 47.5 10/16/2018 12:38 PM    Last diabetic Eye exam:  Lab Results  Component Value Date/Time   HMDIABEYEEXA No Retinopathy 02/04/2020 01:48 PM    Last diabetic Foot exam: No results found for: HMDIABFOOTEX   Checking BG: 3x per Day  Recent FBG Readings: 140s-150s per patient Recent 2hr PP BG readings:  150-230 range per patient   Patient has failed these meds in past: none noted Patient is currently uncontrolled on the following medications: . TresibTyler Aasml 62 units hs . Metformin 1000mg t51m daily . Novolog 100u/ml 10 units tid  Patient most recent A1c returned today and remains elevated. Based on A1c patients blood sugars are averaging around 230.  Patients record of at home blood sugars does not match A1c.  Need for accurate reporting to be able to adjust medications.  Lots of  room for improvement on diet.  Reports diet high in carbohydrates.  Counseled on limiting carbohydrates to one serving per meal and choosing brown carbs over white.  Also, UACR increased on most recent labs. - Lisinopril 2.82m daily added by PCP  Update 07/14/20 FBG - 120 today, majority of sugars have been < 130 Did mention one reading of low 70s Counseled on eating regular meals to avoid hypoglycemia Encouraged her to contact uKoreashould she see many readings in the 70s   Plan Continue current medications Contact providers with hypoglycemia  Hyperlipidemia   LDL goal < 70  Lipid Panel     Component Value Date/Time   CHOL 132 05/15/2020 1539   TRIG 279 (H) 05/15/2020 1539   HDL 31 (L) 05/15/2020 1539   LDLCALC 64 05/15/2020 1539    Hepatic Function Latest Ref Rng & Units 05/15/2020 01/07/2020 09/24/2019  Total Protein 6.1 - 8.1 g/dL 7.0 6.7 7.1  Albumin 3.5 - 5.0 g/dL - - -  AST 10 - 35 U/L 22 14 25   ALT 6 - 29 U/L 18 15 21   Alk Phosphatase 38 - 126 U/L - - -   Total Bilirubin 0.2 - 1.2 mg/dL 0.5 0.4 0.4  Bilirubin, Direct 0.0 - 0.3 mg/dL - - -     The 10-year ASCVD risk score (Mikey BussingDC Jr., et al., 2013) is: 43.3%   Values used to calculate the score:     Age: [age]years     Sex: Female     Is Non-Hispanic African American: No     Diabetic: Yes     Tobacco smoker: No     Systolic Blood Pressure: 1347mmHg     Is BP treated: Yes     HDL Cholesterol: 31 mg/dL     Total Cholesterol: 132 mg/dL   Patient has failed these meds in past: none noted Patient is currently controlled based on LDL, TG out of range on the following medications:  . Atorvastatin 442mtablet . Zetia 1060mablet  Patient updated lipid panel came back today.  LDL is controlled, TG's are elevated.  Patient verbalizes compliance with all medications.  Really think her main issue is diet related, she verbalizes diet high in carbohydrates and admits that she loves bread.  Counseled on limiting foods high in fats and sugars.  Update 07/14/20 Reviewed most recent lipid panel Reinforced medication adherence Denies myalgias Counseled on limitation of fried and fatty goods  Plan  Continue current medications  Update lipid panel at next f/u visit   Hypertension    Office blood pressures are  BP Readings from Last 3 Encounters:  06/09/20 (!) 146/71  05/15/20 136/82  02/10/20 138/84    Patient has failed these meds in the past: none noted  Patient checks BP at home daily  Patient home BP readings are ranging: no logs available, daughter takes care of blood pressure Patient is currently controlled on the following medications:   Amlodipine 5mg50mily  Isosorbide 30mg71mly  Lisinopril 2.5mg d44my  Office blood pressures normal, monitored at home by daughter.  Patient does report swelling in ankles, however, it is mild and subsides when feet are elevated.   Update 07/14/20 No specific readings Asked patient to monitor and record at least a few times per  week She had headache this past weekend but did not check BP  Plan  Continue current medications  Monitor and record BP at least 2-3 times per week  CAD  Patient has failed these meds in past: none noted Patient is currently controlled on the following medications:   ASA 72m  Plavix 75 mg daily  No chest pain, no abnormal bruising bleeding.  Counseled with fall risk to make sure she is being careful and any bleeding stops within a reasonable amount of time.  Update 07/14/20 No abnormal bleed  Plan  Continue current medications   Depression/Anxiety   Patient has failed these meds in past: none noted Patient is currently controlled on the following medications:  . Fluoxetine 467mdaily  Reports good mood overall lately, sleep patterns normal.  States she is sleeping through the night with the exception of using the bathroom and waking aroun 8-8:30 am daily.  Update 07/14/20 Sleep is good, mood is good  Plan  Continue current medications  Vitamin D Deficiency   01/07/2020 - Vit D - 54 (WNL)   Patient has failed these meds in past: none noted Patient is currently controlled on the following medications:  . Marland Kitchenitamin D 1000 IU daily  Patient controlled on normal OTC therapy.  Plan  Continue current medications.  Vaccines   Reviewed and discussed patient's vaccination history.    Immunization History  Administered Date(s) Administered  . Fluad Quad(high Dose 65+) 02/27/2019, 05/15/2020  . Influenza Whole 02/16/2011  . Influenza, High Dose Seasonal PF 08/15/2017  . Influenza,inj,Quad PF,6+ Mos 06/18/2014, 02/12/2015, 08/01/2016  . Moderna Sars-Covid-2 Vaccination 12/24/2019, 01/24/2020  . Pneumococcal Conjugate-13 06/18/2014  . Pneumococcal Polysaccharide-23 01/12/2000, 12/26/2012, 12/13/2016    Plan  Recommended patient receive shingles vaccine in office.  Medication Management   . Miscellaneous medications: o Fluticasone 5041mo Gabapentin  300m12md o Nitrostat 0.4 SL o Oxybutynin 5mg 24mly . OTC's: none additional . Patient currently uses [facility] order pharmacy.  Phone #  [phone] reports using pill box method to organize medications and promote adherence. . Patient denies missed doses of medication.   ChrisBeverly MilchrmD Clinical Pharmacist [place])[phone]

## 2020-07-20 ENCOUNTER — Other Ambulatory Visit: Payer: Self-pay | Admitting: Family Medicine

## 2020-07-23 ENCOUNTER — Encounter: Payer: Self-pay | Admitting: General Surgery

## 2020-07-23 ENCOUNTER — Ambulatory Visit (INDEPENDENT_AMBULATORY_CARE_PROVIDER_SITE_OTHER): Payer: Medicare PPO | Admitting: General Surgery

## 2020-07-23 ENCOUNTER — Other Ambulatory Visit: Payer: Self-pay

## 2020-07-23 VITALS — HR 99 | Temp 97.0°F | Resp 14 | Ht 62.0 in | Wt 203.0 lb

## 2020-07-23 DIAGNOSIS — C44629 Squamous cell carcinoma of skin of left upper limb, including shoulder: Secondary | ICD-10-CM

## 2020-07-23 NOTE — Patient Instructions (Signed)
Stop Plavix on 08/07/20

## 2020-07-24 NOTE — H&P (Signed)
Kristen [name]; [id number]; 1945/09/29   HPI Patient is a 75 year old white female who was referred to my care by Dr. Allyn Kenner for excision of a squamous cell carcinoma of the left arm.  She had been referred to dermatology but they felt that the reexcision had to be performed in the operating room under sedation.  Patient states the area does itch. Past Medical History:  Diagnosis Date  . Anemia   . Anxiety   . Arthritis   . Bifascicular block   . Blood transfusion without reported diagnosis   . CAD (coronary artery disease)    DES to LAD 02/2019  . Depression   . Diabetic neuropathy ([place])   . Essential hypertension   . GERD (gastroesophageal reflux disease)   . Hemorrhoids   . History of stroke    a. MRI 12/2016 - remote left cerebellar infarcts incidentally noted.  . Hyperlipidemia   . Malignant neoplasm of upper-outer quadrant of left female breast ([place]) 11/30/2012  . Nephrolithiasis   . Obesity   . Overactive bladder   . Personal history of colonic polyps    09/2010 - 2 diminutive adenomas 02/12/2016 5 mm descending polyp - prolapse type polyp (not precancerous) no recall needed given hx, findings and age   . S/P placement of cardiac pacemaker 01/03/2017  . Type 2 diabetes mellitus ([place])   . Vitamin D deficiency     Past Surgical History:  Procedure Laterality Date  . APPENDECTOMY    . BACK SURGERY    . BREAST SURGERY    . CARDIAC CATHETERIZATION  01/2009   cath by Dr Lia Foyer revealed nonobstructive CAD  . CESAREAN SECTION    . CHOLECYSTECTOMY    . CHOLECYSTECTOMY, LAPAROSCOPIC    . COLONOSCOPY     last 2012- with polyps  . CORONARY STENT INTERVENTION N/A 02/26/2019   Procedure: CORONARY STENT INTERVENTION;  Surgeon: Jettie Booze, MD;  Location: [facility];  Service: Cardiovascular;  Laterality: N/A;  . INTRAVASCULAR PRESSURE WIRE/FFR STUDY N/A 02/26/2019   Procedure: INTRAVASCULAR PRESSURE WIRE/FFR STUDY;  Surgeon: Jettie Booze, MD;  Location: [facility];  Service: Cardiovascular;  Laterality: N/A;  . JOINT REPLACEMENT    . LEFT HEART CATH AND CORONARY ANGIOGRAPHY N/A 02/26/2019   Procedure: LEFT HEART CATH AND CORONARY ANGIOGRAPHY;  Surgeon: Jettie Booze, MD;  Location: [place];  Service: Cardiovascular;  Laterality: N/A;  . LITHOTRIPSY    . MASTECTOMY Left 2014  . MASTECTOMY W/ SENTINEL NODE BIOPSY Left 12/24/2012   Procedure: LEFT MASTECTOMY WITH LEFT SENTINEL LYMPH NODE BIOPSY;  Surgeon: Rolm Bookbinder, MD;  Location: [facility];  Service: General;  Laterality: Left;  . OOPHORECTOMY Right    1.5  ovaries rem  . PACEMAKER IMPLANT N/A 12/19/2016   Procedure: Pacemaker Implant;  Surgeon: Deboraha Sprang, MD;  Location: [facility];  Service: Cardiovascular;  Laterality: N/A;  . PARTIAL HIP ARTHROPLASTY     left hip  . POLYPECTOMY    . REPLACEMENT TOTAL KNEE     both knees  . TONSILLECTOMY    . TOTAL MASTECTOMY Left 12/24/2012   Dr Donne Hazel    Family History  Problem Relation Age of Onset  . Cancer Mother        mouth cancer  . Breast cancer Mother        possibly dx in her 29s  . Lung cancer Brother 60  . Lung cancer Brother   . Lung  cancer Brother   . Heart attack Father 55  . Breast cancer Sister   . Brain cancer Brother   . Colon cancer Neg Hx   . Colon polyps Neg Hx   . Rectal cancer Neg Hx   . Stomach cancer Neg Hx   . Esophageal cancer Neg Hx     Current Outpatient Medications on File Prior to Visit  Medication Sig Dispense Refill  . ACCU-CHEK AVIVA PLUS test strip USE AS DIRECTED TO MONITOR FINGERSTICK BLOOD SUGAR THREE TIMES DAILY 300 strip 11  . Accu-Chek Softclix Lancets lancets TEST BLOOD SUGAR THREE TIMES DAILY (NEED MD APPOINTMENT) 200 each 0  . amLODipine (NORVASC) 5 MG tablet TAKE 1 TABLET EVERY DAY 90 tablet 3  . aspirin EC 81 MG tablet Take 81 mg by mouth daily.    Marland Kitchen atorvastatin (LIPITOR) 40 MG tablet TAKE 1 TABLET AT BEDTIME 90 tablet 3  . cholecalciferol  (VITAMIN D3) 25 MCG (1000 UNIT) tablet Take 1 tablet (1,000 Units total) by mouth daily.    . clopidogrel (PLAVIX) 75 MG tablet Take 1 tablet (75 mg total) by mouth daily with breakfast. 90 tablet 3  . ezetimibe (ZETIA) 10 MG tablet TAKE 1 TABLET (10 MG TOTAL) BY MOUTH DAILY. 90 tablet 3  . FLUoxetine (PROZAC) 40 MG capsule Take 1 capsule (40 mg total) by mouth daily. 90 capsule 2  . fluticasone (FLONASE) 50 MCG/ACT nasal spray USE 2 SPRAYS IN EACH NOSTRIL EVERY DAY AS NEEDED (SINUS ISSUES) (NEED TO CALL THE CLINIC TO SCHEDULE AN APPT FOR REFILLS) 16 g 3  . gabapentin (NEURONTIN) 300 MG capsule TAKE ONE CAPSULE BY MOUTH 2 TIMES A DAY 180 capsule 3  . insulin aspart (NOVOLOG FLEXPEN) 100 UNIT/ML FlexPen Inject 12 units SQ 3x daily with meals 90 mL 3  . Insulin Pen Needle (UNIFINE PENTIPS) 32G X 4 MM MISC USE TO INJECT TRESIBA AND NOVOLOG EVERY DAY AS DIRECTED 100 each 0  . isosorbide mononitrate (IMDUR) 30 MG 24 hr tablet TAKE 1 TABLET (30 MG TOTAL) BY MOUTH DAILY. 90 tablet 1  . lisinopril (ZESTRIL) 2.5 MG tablet Take 1 tablet (2.5 mg total) by mouth daily. 90 tablet 3  . metFORMIN (GLUCOPHAGE) 1000 MG tablet TAKE 1 TABLET TWICE DAILY WITH MEALS 180 tablet 3  . nitroGLYCERIN (NITROSTAT) 0.4 MG SL tablet Place 1 tablet (0.4 mg total) under the tongue every 5 (five) minutes as needed. 25 tablet 12  . oxybutynin (DITROPAN-XL) 5 MG 24 hr tablet TAKE 1 TABLET AT BEDTIME 90 tablet 3  . pantoprazole (PROTONIX) 40 MG tablet TAKE 1 TABLET (40 MG TOTAL) BY MOUTH DAILY AT 6 AM. 90 tablet 3  . TRESIBA FLEXTOUCH 100 UNIT/ML FlexTouch Pen INJECT 62 UNITS INTO THE SKIN AT BEDTIME 60 mL 3   No current facility-administered medications on file prior to visit.    Allergies  Allergen Reactions  . Sulfonamide Derivatives Hives    Social History   Substance and Sexual Activity  Alcohol Use No    Social History   Tobacco Use  Smoking Status Never Smoker  Smokeless Tobacco Never Used    Review of  Systems  Constitutional: Negative.   HENT: Negative.   Eyes: Negative.   Respiratory: Negative.  Negative for cough.   Cardiovascular: Negative.   Gastrointestinal: Negative.   Genitourinary: Negative.   Musculoskeletal: Positive for back pain and joint pain.  Skin: Positive for itching.  Neurological: Positive for headaches.  Endo/Heme/Allergies: Bruises/bleeds easily.  Psychiatric/Behavioral: Negative.  Objective   Vitals:   07/23/20 1116  Pulse: 99  Resp: 14  Temp: (!) 97 F (36.1 C)  SpO2: 96%    Physical Exam Vitals reviewed.  Constitutional:      Appearance: Normal appearance. She is not ill-appearing.  HENT:     Head: Normocephalic and atraumatic.  Cardiovascular:     Rate and Rhythm: Normal rate and regular rhythm.     Heart sounds: Normal heart sounds. No murmur heard. No friction rub. No gallop.   Pulmonary:     Effort: Pulmonary effort is normal. No respiratory distress.     Breath sounds: Normal breath sounds. No stridor. No wheezing, rhonchi or rales.  Skin:    General: Skin is warm and dry.     Comments: 2 cm ovoid malignant skin lesion at the left antecubital fossa.  Mobile.  Neurological:     Mental Status: She is alert and oriented to person, place, and time.     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  The risks and benefits of the procedure including bleeding and the possible need for reexcision due to unclear margins were fully explained to the patient, who gave informed consent.  She will stop her Plavix 1 week prior to the procedure.  We will discuss with anesthesia whether any adjustments to her pacemaker need to  be done.

## 2020-07-24 NOTE — Progress Notes (Signed)
Kristen [name]; [id number]; 10-21-1945   HPI Patient is a 75 year old white female who was referred to my care by Dr. Allyn Kenner for excision of a squamous cell carcinoma of the left arm.  She had been referred to dermatology but they felt that the reexcision had to be performed in the operating room under sedation.  Patient states the area does itch. Past Medical History:  Diagnosis Date  . Anemia   . Anxiety   . Arthritis   . Bifascicular block   . Blood transfusion without reported diagnosis   . CAD (coronary artery disease)    DES to LAD 02/2019  . Depression   . Diabetic neuropathy ([place])   . Essential hypertension   . GERD (gastroesophageal reflux disease)   . Hemorrhoids   . History of stroke    a. MRI 12/2016 - remote left cerebellar infarcts incidentally noted.  . Hyperlipidemia   . Malignant neoplasm of upper-outer quadrant of left female breast ([place]) 11/30/2012  . Nephrolithiasis   . Obesity   . Overactive bladder   . Personal history of colonic polyps    09/2010 - 2 diminutive adenomas 02/12/2016 5 mm descending polyp - prolapse type polyp (not precancerous) no recall needed given hx, findings and age   . S/P placement of cardiac pacemaker 01/03/2017  . Type 2 diabetes mellitus ([place])   . Vitamin D deficiency     Past Surgical History:  Procedure Laterality Date  . APPENDECTOMY    . BACK SURGERY    . BREAST SURGERY    . CARDIAC CATHETERIZATION  01/2009   cath by Dr Lia Foyer revealed nonobstructive CAD  . CESAREAN SECTION    . CHOLECYSTECTOMY    . CHOLECYSTECTOMY, LAPAROSCOPIC    . COLONOSCOPY     last 2012- with polyps  . CORONARY STENT INTERVENTION N/A 02/26/2019   Procedure: CORONARY STENT INTERVENTION;  Surgeon: Jettie Booze, MD;  Location: [facility];  Service: Cardiovascular;  Laterality: N/A;  . INTRAVASCULAR PRESSURE WIRE/FFR STUDY N/A 02/26/2019   Procedure: INTRAVASCULAR PRESSURE WIRE/FFR STUDY;  Surgeon: Jettie Booze, MD;  Location: [facility];  Service: Cardiovascular;  Laterality: N/A;  . JOINT REPLACEMENT    . LEFT HEART CATH AND CORONARY ANGIOGRAPHY N/A 02/26/2019   Procedure: LEFT HEART CATH AND CORONARY ANGIOGRAPHY;  Surgeon: Jettie Booze, MD;  Location: [facility];  Service: Cardiovascular;  Laterality: N/A;  . LITHOTRIPSY    . MASTECTOMY Left 2014  . MASTECTOMY W/ SENTINEL NODE BIOPSY Left 12/24/2012   Procedure: LEFT MASTECTOMY WITH LEFT SENTINEL LYMPH NODE BIOPSY;  Surgeon: Rolm Bookbinder, MD;  Location: [facility];  Service: General;  Laterality: Left;  . OOPHORECTOMY Right    1.5  ovaries rem  . PACEMAKER IMPLANT N/A 12/19/2016   Procedure: Pacemaker Implant;  Surgeon: Deboraha Sprang, MD;  Location: [facility];  Service: Cardiovascular;  Laterality: N/A;  . PARTIAL HIP ARTHROPLASTY     left hip  . POLYPECTOMY    . REPLACEMENT TOTAL KNEE     both knees  . TONSILLECTOMY    . TOTAL MASTECTOMY Left 12/24/2012   Dr Donne Hazel    Family History  Problem Relation Age of Onset  . Cancer Mother        mouth cancer  . Breast cancer Mother        possibly dx in her 56s  . Lung cancer Brother 13  . Lung cancer Brother   . Lung  cancer Brother   . Heart attack Father 4  . Breast cancer Sister   . Brain cancer Brother   . Colon cancer Neg Hx   . Colon polyps Neg Hx   . Rectal cancer Neg Hx   . Stomach cancer Neg Hx   . Esophageal cancer Neg Hx     Current Outpatient Medications on File Prior to Visit  Medication Sig Dispense Refill  . ACCU-CHEK AVIVA PLUS test strip USE AS DIRECTED TO MONITOR FINGERSTICK BLOOD SUGAR THREE TIMES DAILY 300 strip 11  . Accu-Chek Softclix Lancets lancets TEST BLOOD SUGAR THREE TIMES DAILY (NEED MD APPOINTMENT) 200 each 0  . amLODipine (NORVASC) 5 MG tablet TAKE 1 TABLET EVERY DAY 90 tablet 3  . aspirin EC 81 MG tablet Take 81 mg by mouth daily.    Marland Kitchen atorvastatin (LIPITOR) 40 MG tablet TAKE 1 TABLET AT BEDTIME 90 tablet 3  . cholecalciferol  (VITAMIN D3) 25 MCG (1000 UNIT) tablet Take 1 tablet (1,000 Units total) by mouth daily.    . clopidogrel (PLAVIX) 75 MG tablet Take 1 tablet (75 mg total) by mouth daily with breakfast. 90 tablet 3  . ezetimibe (ZETIA) 10 MG tablet TAKE 1 TABLET (10 MG TOTAL) BY MOUTH DAILY. 90 tablet 3  . FLUoxetine (PROZAC) 40 MG capsule Take 1 capsule (40 mg total) by mouth daily. 90 capsule 2  . fluticasone (FLONASE) 50 MCG/ACT nasal spray USE 2 SPRAYS IN EACH NOSTRIL EVERY DAY AS NEEDED (SINUS ISSUES) (NEED TO CALL THE CLINIC TO SCHEDULE AN APPT FOR REFILLS) 16 g 3  . gabapentin (NEURONTIN) 300 MG capsule TAKE ONE CAPSULE BY MOUTH 2 TIMES A DAY 180 capsule 3  . insulin aspart (NOVOLOG FLEXPEN) 100 UNIT/ML FlexPen Inject 12 units SQ 3x daily with meals 90 mL 3  . Insulin Pen Needle (UNIFINE PENTIPS) 32G X 4 MM MISC USE TO INJECT TRESIBA AND NOVOLOG EVERY DAY AS DIRECTED 100 each 0  . isosorbide mononitrate (IMDUR) 30 MG 24 hr tablet TAKE 1 TABLET (30 MG TOTAL) BY MOUTH DAILY. 90 tablet 1  . lisinopril (ZESTRIL) 2.5 MG tablet Take 1 tablet (2.5 mg total) by mouth daily. 90 tablet 3  . metFORMIN (GLUCOPHAGE) 1000 MG tablet TAKE 1 TABLET TWICE DAILY WITH MEALS 180 tablet 3  . nitroGLYCERIN (NITROSTAT) 0.4 MG SL tablet Place 1 tablet (0.4 mg total) under the tongue every 5 (five) minutes as needed. 25 tablet 12  . oxybutynin (DITROPAN-XL) 5 MG 24 hr tablet TAKE 1 TABLET AT BEDTIME 90 tablet 3  . pantoprazole (PROTONIX) 40 MG tablet TAKE 1 TABLET (40 MG TOTAL) BY MOUTH DAILY AT 6 AM. 90 tablet 3  . TRESIBA FLEXTOUCH 100 UNIT/ML FlexTouch Pen INJECT 62 UNITS INTO THE SKIN AT BEDTIME 60 mL 3   No current facility-administered medications on file prior to visit.    Allergies  Allergen Reactions  . Sulfonamide Derivatives Hives    Social History   Substance and Sexual Activity  Alcohol Use No    Social History   Tobacco Use  Smoking Status Never Smoker  Smokeless Tobacco Never Used    Review of  Systems  Constitutional: Negative.   HENT: Negative.   Eyes: Negative.   Respiratory: Negative.  Negative for cough.   Cardiovascular: Negative.   Gastrointestinal: Negative.   Genitourinary: Negative.   Musculoskeletal: Positive for back pain and joint pain.  Skin: Positive for itching.  Neurological: Positive for headaches.  Endo/Heme/Allergies: Bruises/bleeds easily.  Psychiatric/Behavioral: Negative.  Objective   Vitals:   07/23/20 1116  Pulse: 99  Resp: 14  Temp: (!) 97 F (36.1 C)  SpO2: 96%    Physical Exam Vitals reviewed.  Constitutional:      Appearance: Normal appearance. She is not ill-appearing.  HENT:     Head: Normocephalic and atraumatic.  Cardiovascular:     Rate and Rhythm: Normal rate and regular rhythm.     Heart sounds: Normal heart sounds. No murmur heard. No friction rub. No gallop.   Pulmonary:     Effort: Pulmonary effort is normal. No respiratory distress.     Breath sounds: Normal breath sounds. No stridor. No wheezing, rhonchi or rales.  Skin:    General: Skin is warm and dry.     Comments: 2 cm ovoid malignant skin lesion at the left antecubital fossa.  Mobile.  Neurological:     Mental Status: She is alert and oriented to person, place, and time.     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  The risks and benefits of the procedure including bleeding and the possible need for reexcision due to unclear margins were fully explained to the patient, who gave informed consent.  She will stop her Plavix 1 week prior to the procedure.  We will discuss with anesthesia whether any adjustments to her pacemaker need to  be done.

## 2020-07-31 ENCOUNTER — Other Ambulatory Visit: Payer: Self-pay | Admitting: Family Medicine

## 2020-08-03 ENCOUNTER — Other Ambulatory Visit: Payer: Self-pay | Admitting: Family Medicine

## 2020-08-11 ENCOUNTER — Other Ambulatory Visit: Payer: Self-pay | Admitting: Family Medicine

## 2020-08-11 NOTE — Patient Instructions (Signed)
Kristen [name]  08/11/2020     @PREFPERIOPPHARMACY @   Your procedure is scheduled on  08/14/2020.   Report to [facility] at  [phone]  A.M.   Call this number if you have problems the morning of surgery:  [phone]   Remember:  Do not eat or drink after midnight.                        Take these medicines the morning of surgery with A SIP OF WATER  Amlodipine, prozac, gabapentin, isosorbide, protonix.   Take 31 units of insulin the night before your procedure. DO NOT take any medications for diabetes the morning of your procedure.  If your glucose is 70 or below the morning of your procedure, drink 1/2 cup of clear juice and recheck your procedure in 15 minutes. If your glucose is still 70 or below, call [phone].  If your glucose is 300 or above the morning of your procedure, call [phone] for instructions.      Do not wear jewelry, make-up or nail polish.  Do not wear lotions, powders, or perfumes, or deodorant.  Do not shave 48 hours prior to surgery.  Men may shave face and neck.  Do not bring valuables to the hospital.  [facility] is not responsible for any belongings or valuables.  Contacts, dentures or bridgework may not be worn into surgery.  Leave your suitcase in the car.  After surgery it may be brought to your room.  For patients admitted to the hospital, discharge time will be determined by your treatment team.  Patients discharged the day of surgery will not be allowed to drive home and must have someone with them for 24 hours.   Place clean sheets on your bed before you sleep the night before your procedure. DO NOT sleep with pets this night.  Shower with CHG the night before and the morning of your procedure. DO NOT put CHG I your face, hair or genitals.  After each shower, dry off with a clean towel, out on clean, comfortable clothes and brush your teeth.    Special instructions:  DO NOT smoke tobacco or vape the morning of your  procedure.  Please read over the following fact sheets that you were given. Anesthesia Post-op Instructions and Care and Recovery After Surgery       Incision Care, Adult An incision is a cut that a doctor makes in your skin for surgery. Most times, these cuts are closed after surgery. Your cut from surgery may be closed with:  Stitches (sutures).  Staples.  Skin glue.  Skin tape (adhesive) strips. You may need to return to your doctor to have stitches or staples taken out. This may happen many days or many weeks after your surgery. You need to take good care of your cut so it does not get infected. Follow instructions from your doctor about how to care for your cut. Supplies needed:  Soap and water.  A clean hand towel.  Wound cleanser.  A clean bandage (dressing), if needed.  Cream or ointment, if told by your doctor.  Clean gauze. How to care for your cut from surgery Cleaning your cut Ask your doctor how to clean your cut. You may need to:  Use mild soap and water, or a wound cleanser.  Use a clean gauze to pat your cut dry after you clean it. Changing your bandage  Wash  your hands with soap and water for at least 20 seconds before and after you change your bandage. If you cannot use soap and water, use hand sanitizer.  Change your bandage as told by your doctor.  Leave stitches, staples, skin glue, or skin tape strips in place. They may need to stay in place for 2 weeks or longer. If tape strips get loose and curl up, you may trim the loose edges. Do not remove tape strips completely unless your doctor says it is okay.  Put a cream or ointment on your cut. Do this only as told.  Cover your cut with a clean bandage.  Ask your doctor when you can leave your cut uncovered. Checking for infection Check your cut area every day for signs of infection. Check for:  More redness, swelling, or pain.  More fluid or blood.  Warmth.  Pus or a bad smell.   Follow  these instructions at home Medicines  Take over-the-counter and prescription medicines only as told by your doctor.  If you were prescribed an antibiotic medicine, cream, or ointment, use it as told by your doctor. Do not stop using the antibiotic even if your condition improves. Eating and drinking  Eat foods that have a lot of certain nutrients, such as protein, vitamin A, and vitamin C. These foods help your cut heal. ? Foods rich in protein include meat, fish, eggs, dairy, beans, and nuts. ? Foods rich in vitamin A include carrots and dark green, leafy vegetables. ? Foods rich in vitamin C include citrus fruits, tomatoes, broccoli, and peppers.  Drink enough fluid to keep your pee (urine) pale yellow. General instructions  Do not take baths, swim, use a hot tub, or put your cut underwater until your doctor approves. Ask your doctor if you may take showers. You may only be allowed to take sponge baths.  Limit movement around your cut. This helps with healing. ? Try not to strain, lift, or exercise for the first 2 weeks, or for as long as told by your doctor. ? Return to your normal activities as told by your doctor. Ask your doctor what activities are safe for you.  Do not scratch or pick at your cut. Keep it covered as told by your doctor.  Protect your cut from the sun when you are outside for the first 6 months, or for as long as told by your doctor. Cover up the scar area or put on sunscreen that has an SPF of at least 30.  Do not use any products that contain nicotine or tobacco, such as cigarettes, e-cigarettes, and chewing tobacco. These can delay cut healing after surgery. If you need help quitting, ask your doctor.  Keep all follow-up visits as told by your doctor. This is important.   Contact a doctor if:  You have any of these signs of infection around your cut: ? More redness, swelling, or pain. ? More fluid or blood. ? Warmth. ? Pus or a bad smell.  You have a  fever.  You feel like you may vomit (nauseous).  You vomit.  You are dizzy.  Your stitches, staples, skin glue, or tape strips come undone. Get help right away if:  Your cut has a red streak coming from it.  Your cut bleeds through your bandage, and bleeding does not stop with gentle pressure.  Your cut opens up and comes apart.  Your body reacts very badly to an infection. This may include: ? A fever, chills,  or feeling cold. ? Feeling mixed up, worried, or nervous. ? Very bad pain. ? Trouble breathing. ? A fast heartbeat. ? Clammy or sweaty skin. ? A rash. These symptoms may be an emergency. Do not wait to see if the symptoms will go away. Get medical help right away. Call your local emergency services (911 in the [place] Do not drive yourself to the hospital. Summary  Follow instructions from your doctor about how to care for your cut from surgery.  Wash your hands with soap and water for at least 20 seconds before and after you change your bandage. If you cannot use soap and water, use hand sanitizer.  Check your cut area every day for signs of infection.  Keep all follow-up visits as told by your doctor. This is important. This information is not intended to replace advice given to you by your health care provider. Make sure you discuss any questions you have with your health care provider. Document Revised: 03/20/2019 Document Reviewed: 03/20/2019 Elsevier Patient Education  2021 [place].  Squamous Cell Carcinoma Squamous cell carcinoma is a common form of skin cancer. It begins in the squamous cells in the outer layer of the skin (epidermis). It occurs most often in parts of the body that are frequently exposed to the sun, such as the face, ears, lips, neck, arms, legs, and hands. However, this condition can occur anywhere on the body, including the inside of the mouth, the genital area, and the anus. If squamous cell carcinoma is treated early, it rarely spreads  to other areas of the body (metastasizes). If it is not treated, it can affect nearby tissues. In rare cases, it can spread to other areas of the body. What are the causes? This condition is usually caused by exposure to ultraviolet (UV) light. UV light may come from the sun or from tanning beds.  Other causes include exposure to:  Arsenic.  Radiation.  Toxic tars and oils. In very rare cases, a genetic condition that makes a person sensitive to sunlight (xeroderma pigmentosum) may cause the condition. What increases the risk? This condition is more likely to develop in people who:  Are older than 76 years of age.  Have fair skin (light complexion).  Have blond or red hair.  Have blue, green, or gray eyes.  Have childhood freckling.  Have had sun exposure over long periods of time, especially during childhood.  Use tanning beds.  Have had psoralen and ultraviolet A (PUVA) treatments.  Have had repeated sunburns.  Have a weakened immune system.  Have an HPV (human papillomavirus) infection.  Have a history of precancerous lesions (actinic keratosis).  Have conditions that cause chronic scarring. These can include burn scars, chronic ulcers, heat (thermal) injuries, and radiation.  Have been exposed to certain chemicals, such as tar, soot, and arsenic.  Smoke. What are the signs or symptoms? This condition often starts as a red, pink, or brown growth on the skin. The growths have an irregular surface that may feel rough. In some cases, the growths are easier to feel than to see. The growths may develop into a sore that does not heal.   How is this diagnosed? This condition may be diagnosed with:  A physical exam.  Removal of a tissue sample to be examined under a microscope (biopsy). How is this treated? Treatment for this condition involves removing the cancerous tissue. The method that is used for this depends on the size and location of the tumor, as  well as your  overall health. Possible treatments include:  Electrodesiccation and curettage. This involves alternately scraping and burning the tumor while using an electric current to control bleeding.  Cryosurgery. This involves freezing the tumor with liquid nitrogen.  Laser therapy. This uses an intense beam of light to remove the tumor.  Photodynamic therapy. A chemical cream is applied to the skin, and light exposure is used to activate the chemical.  High-energy rays that kill cancer cells (radiation therapy). This may be used for tumors that are deeper in the tissues.  Surgical removal (excision) of the tumor. This involves removing the entire tumor and a small amount of normal skin that surrounds it.  Mohs surgery. In this procedure, the cancerous skin cells are removed layer by layer until all of the tumor has been removed.  Plastic surgery. The tumor is removed, and healthy skin from another part of the body is used to cover the wound (skin graft).  Chemotherapy. This treatment uses medicines to destroy cancer cells.  Chemotherapy creams or lotions. These may be applied directly to the skin where the tumor is located and may be used for smaller tumors.  Targeted therapy. This targets specific parts of cancer cells and the area around them to block the growth and spread of the cancer.  Immunotherapy. This treatment helps your body's immune system fight the cancer cells. Follow these instructions at home:  Avoid being out in the sun. Wear protective clothing, including long-sleeved shirts, long pants, and a hat when outdoors.  Do skin self-exams as told by your health care provider. Look for any new spots or changes in your skin.  Do not use any products that contain nicotine or tobacco, such as cigarettes, e-cigarettes, and chewing tobacco. If you need help quitting, ask your health care provider.  Keep all follow-up visits as told by your health care provider. This is important.   How  is this prevented?  Avoid the sun when it is at its strongest. This is usually between 10:00 a.m. and 4:00 p.m.  When you are out in the sun, use a sunscreen that has a sun protection factor (SPF) of at least 64.  Apply sunscreen at least 30 minutes before exposure to the sun.  Reapply sunscreen every 2-4 hours while you are outside. Also reapply it after swimming and after excessive sweating.  Always wear hats, protective clothing, and UV-blocking sunglasses when you are outdoors.  Do not use tanning beds.   Contact a health care provider if:  You notice any new growths or any changes in your skin.  You have had a squamous cell carcinoma tumor removed and you notice a new growth in the same location. Get help right away if you have:  A fever or chills.  Chest pain or difficulty breathing. Summary  Squamous cell carcinoma is a common form of skin cancer. It begins in the squamous cells in the outer layer of the skin (epidermis).  This condition is usually caused by exposure to ultraviolet (UV) light.  Treatment for this condition involves removing the cancerous tissue. The method that is used for this depends on the size and location of the tumor, as well as your overall health.  Contact a health care provider if you notice any new growths or any changes in your skin.  Keep all follow-up visits as told by your health care provider. This is important. This information is not intended to replace advice given to you by your health care provider.  Make sure you discuss any questions you have with your health care provider. Document Revised: 02/20/2018 Document Reviewed: 02/20/2018 Elsevier Patient Education  2021 [place] Anesthesia, Adult, Care After This sheet gives you information about how to care for yourself after your procedure. Your health care provider may also give you more specific instructions. If you have problems or questions, contact your health care  provider. What can I expect after the procedure? After the procedure, the following side effects are common:  Pain or discomfort at the IV site.  Nausea.  Vomiting.  Sore throat.  Trouble concentrating.  Feeling cold or chills.  Feeling weak or tired.  Sleepiness and fatigue.  Soreness and body aches. These side effects can affect parts of the body that were not involved in surgery. Follow these instructions at home: For the time period you were told by your health care provider:  Rest.  Do not participate in activities where you could fall or become injured.  Do not drive or use machinery.  Do not drink alcohol.  Do not take sleeping pills or medicines that cause drowsiness.  Do not make important decisions or sign legal documents.  Do not take care of children on your own.   Eating and drinking  Follow any instructions from your health care provider about eating or drinking restrictions.  When you feel hungry, start by eating small amounts of foods that are soft and easy to digest (bland), such as toast. Gradually return to your regular diet.  Drink enough fluid to keep your urine pale yellow.  If you vomit, rehydrate by drinking water, juice, or clear broth. General instructions  If you have sleep apnea, surgery and certain medicines can increase your risk for breathing problems. Follow instructions from your health care provider about wearing your sleep device: ? Anytime you are sleeping, including during daytime naps. ? While taking prescription pain medicines, sleeping medicines, or medicines that make you drowsy.  Have a responsible adult stay with you for the time you are told. It is important to have someone help care for you until you are awake and alert.  Return to your normal activities as told by your health care provider. Ask your health care provider what activities are safe for you.  Take over-the-counter and prescription medicines only as told  by your health care provider.  If you smoke, do not smoke without supervision.  Keep all follow-up visits as told by your health care provider. This is important. Contact a health care provider if:  You have nausea or vomiting that does not get better with medicine.  You cannot eat or drink without vomiting.  You have pain that does not get better with medicine.  You are unable to pass urine.  You develop a skin rash.  You have a fever.  You have redness around your IV site that gets worse. Get help right away if:  You have difficulty breathing.  You have chest pain.  You have blood in your urine or stool, or you vomit blood. Summary  After the procedure, it is common to have a sore throat or nausea. It is also common to feel tired.  Have a responsible adult stay with you for the time you are told. It is important to have someone help care for you until you are awake and alert.  When you feel hungry, start by eating small amounts of foods that are soft and easy to digest (bland), such as toast. Gradually return  to your regular diet.  Drink enough fluid to keep your urine pale yellow.  Return to your normal activities as told by your health care provider. Ask your health care provider what activities are safe for you. This information is not intended to replace advice given to you by your health care provider. Make sure you discuss any questions you have with your health care provider. Document Revised: 02/13/2020 Document Reviewed: 09/12/2019 Elsevier Patient Education  2021 [employer].

## 2020-08-12 ENCOUNTER — Other Ambulatory Visit (HOSPITAL_COMMUNITY)
Admission: RE | Admit: 2020-08-12 | Discharge: 2020-08-12 | Disposition: A | Discharge status: Home / Self Care | Payer: Medicare PPO | Source: Ambulatory Visit | Attending: General Surgery | Admitting: General Surgery

## 2020-08-12 ENCOUNTER — Encounter (HOSPITAL_COMMUNITY): Payer: Self-pay

## 2020-08-12 ENCOUNTER — Encounter (HOSPITAL_COMMUNITY)
Admission: RE | Admit: 2020-08-12 | Discharge: 2020-08-12 | Disposition: A | Discharge status: Home / Self Care | Payer: Medicare PPO | Source: Ambulatory Visit | Attending: General Surgery | Admitting: General Surgery

## 2020-08-12 ENCOUNTER — Other Ambulatory Visit: Payer: Self-pay

## 2020-08-12 DIAGNOSIS — U071 COVID-19: Secondary | ICD-10-CM | POA: Diagnosis not present

## 2020-08-12 DIAGNOSIS — Z01812 Encounter for preprocedural laboratory examination: Secondary | ICD-10-CM | POA: Insufficient documentation

## 2020-08-12 LAB — CBC WITH DIFFERENTIAL/PLATELET
Abs Immature Granulocytes: 0.04 10*3/uL (ref 0.00–0.07)
Basophils Absolute: 0 10*3/uL (ref 0.0–0.1)
Basophils Relative: 1 %
Eosinophils Absolute: 0.3 10*3/uL (ref 0.0–0.5)
Eosinophils Relative: 5 %
HCT: 40.9 % (ref 36.0–46.0)
Hemoglobin: 12.5 g/dL (ref 12.0–15.0)
Immature Granulocytes: 1 %
Lymphocytes Relative: 32 %
Lymphs Abs: 1.8 10*3/uL (ref 0.7–4.0)
MCH: 30 pg (ref 26.0–34.0)
MCHC: 30.6 g/dL (ref 30.0–36.0)
MCV: 98.3 fL (ref 80.0–100.0)
Monocytes Absolute: 0.5 10*3/uL (ref 0.1–1.0)
Monocytes Relative: 8 %
Neutro Abs: 3 10*3/uL (ref 1.7–7.7)
Neutrophils Relative %: 53 %
Platelets: 266 10*3/uL (ref 150–400)
RBC: 4.16 MIL/uL (ref 3.87–5.11)
RDW: 15 % (ref 11.5–15.5)
WBC: 5.6 10*3/uL (ref 4.0–10.5)
nRBC: 0 % (ref 0.0–0.2)

## 2020-08-12 LAB — HEMOGLOBIN A1C
Hgb A1c MFr Bld: 7.6 % — ABNORMAL HIGH (ref 4.8–5.6)
Mean Plasma Glucose: 171.42 mg/dL

## 2020-08-12 LAB — BASIC METABOLIC PANEL
Anion gap: 11 (ref 5–15)
BUN: 26 mg/dL — ABNORMAL HIGH (ref 8–23)
CO2: 23 mmol/L (ref 22–32)
Calcium: 9.2 mg/dL (ref 8.9–10.3)
Chloride: 104 mmol/L (ref 98–111)
Creatinine, Ser: 0.97 mg/dL (ref 0.44–1.00)
GFR, Estimated: >60 mL/min (ref >60)
Glucose, Bld: 227 mg/dL — ABNORMAL HIGH (ref 70–99)
Potassium: 4.5 mmol/L (ref 3.5–5.1)
Sodium: 138 mmol/L (ref 135–145)

## 2020-08-12 LAB — SARS CORONAVIRUS 2 (TAT 6-24 HRS): SARS Coronavirus 2: POSITIVE — AB

## 2020-08-13 ENCOUNTER — Encounter (HOSPITAL_COMMUNITY): Payer: Self-pay | Admitting: Anesthesiology

## 2020-08-13 ENCOUNTER — Telehealth: Payer: Self-pay

## 2020-08-13 NOTE — Telephone Encounter (Signed)
Called to discuss with patient about COVID-19 symptoms and the use of one of the available treatments for those with mild to moderate Covid symptoms and at a high risk of hospitalization.  Pt appears to qualify for outpatient treatment due to co-morbid conditions and/or a member of an at-risk group in accordance with the FDA Emergency Use Authorization.    Symptom onset: Unknown Vaccinated: Yes Booster? Unknown Immunocompromised? No Qualifiers: HTN,CAD,DM,Obesity  Unable to reach pt - Left message and call back number [phone].   Kristen [name]

## 2020-08-14 ENCOUNTER — Ambulatory Visit (HOSPITAL_COMMUNITY): Admission: RE | Admit: 2020-08-14 | Payer: Medicare PPO | Source: Home / Self Care | Admitting: General Surgery

## 2020-08-14 ENCOUNTER — Encounter (HOSPITAL_COMMUNITY): Admission: RE | Payer: Self-pay | Source: Home / Self Care

## 2020-08-14 SURGERY — EXCISION MASS
Anesthesia: Monitor Anesthesia Care | Laterality: Left

## 2020-08-25 ENCOUNTER — Other Ambulatory Visit: Payer: Self-pay

## 2020-08-25 ENCOUNTER — Telehealth: Payer: Self-pay | Admitting: Pharmacist

## 2020-08-25 ENCOUNTER — Encounter (HOSPITAL_COMMUNITY)
Admission: RE | Admit: 2020-08-25 | Discharge: 2020-08-25 | Disposition: A | Discharge status: Home / Self Care | Payer: Medicare PPO | Source: Ambulatory Visit | Attending: General Surgery | Admitting: General Surgery

## 2020-08-25 NOTE — Progress Notes (Addendum)
Chronic Care Management Pharmacy Assistant   Name: Kristen [name]  MRN: [id number] DOB: 1946/01/31   Reason for Encounter: Disease State For DM and HTN.   Conditions to be addressed/monitored: hypertension, CAD,  Type II DM, hyperlipidemia, depression/anxiety, vitamin D deficiency.  Recent office visits:  None since 07/14/20  Recent consult visits:  07/23/20 General Surgeon Aviva Signs, MD. 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 No medication changes.   Hospital visits:  None in previous 6 months  Medications: Outpatient Encounter Medications as of 08/25/2020  Medication Sig Note   ACCU-CHEK AVIVA PLUS test strip USE AS DIRECTED TO MONITOR FINGERSTICK BLOOD SUGAR THREE TIMES DAILY    Accu-Chek Softclix Lancets lancets TEST BLOOD SUGAR THREE TIMES DAILY (NEED MD APPOINTMENT)    acetaminophen (TYLENOL) 325 MG tablet Take 650 mg by mouth every 6 (six) hours as needed for moderate pain.    amLODipine (NORVASC) 5 MG tablet TAKE 1 TABLET EVERY DAY (Patient taking differently: Take 5 mg by mouth daily.)    aspirin EC 81 MG tablet Take 81 mg by mouth daily.    atorvastatin (LIPITOR) 40 MG tablet TAKE 1 TABLET AT BEDTIME (Patient taking differently: Take 40 mg by mouth at bedtime.)    cholecalciferol (VITAMIN D3) 25 MCG (1000 UNIT) tablet Take 1 tablet (1,000 Units total) by mouth daily.    clopidogrel (PLAVIX) 75 MG tablet Take 1 tablet (75 mg total) by mouth daily with breakfast. 08/20/2020: Will hold starting 08/23/20   ezetimibe (ZETIA) 10 MG tablet TAKE 1 TABLET (10 MG TOTAL) BY MOUTH DAILY.    FLUoxetine (PROZAC) 40 MG capsule Take 1 capsule (40 mg total) by mouth daily.    fluticasone (FLONASE) 50 MCG/ACT nasal spray USE 2 SPRAYS IN EACH NOSTRIL EVERY DAY AS NEEDED (SINUS ISSUES) (NEED TO CALL THE CLINIC TO SCHEDULE AN APPT FOR REFILLS) (Patient taking differently: Place 2 sprays into both nostrils daily as needed for allergies.)    gabapentin  (NEURONTIN) 300 MG capsule TAKE ONE CAPSULE BY MOUTH 2 TIMES A DAY (Patient taking differently: Take 300 mg by mouth 2 (two) times daily. TAKE ONE CAPSULE BY MOUTH 2 TIMES A DAY)    insulin aspart (NOVOLOG FLEXPEN) 100 UNIT/ML FlexPen Inject 12 units SQ 3x daily with meals (Patient taking differently: Inject 14 Units into the skin 3 (three) times daily with meals.)    Insulin Pen Needle (UNIFINE PENTIPS) 32G X 4 MM MISC USE TO INJECT TRESIBA AND NOVOLOG EVERY DAY AS DIRECTED    isosorbide mononitrate (IMDUR) 30 MG 24 hr tablet TAKE 1 TABLET (30 MG TOTAL) BY MOUTH DAILY.    lisinopril (ZESTRIL) 2.5 MG tablet Take 1 tablet (2.5 mg total) by mouth daily.    metFORMIN (GLUCOPHAGE) 1000 MG tablet TAKE 1 TABLET TWICE DAILY WITH MEALS (Patient taking differently: Take 1,000 mg by mouth 2 (two) times daily with a meal.)    nitroGLYCERIN (NITROSTAT) 0.4 MG SL tablet Place 1 tablet (0.4 mg total) under the tongue every 5 (five) minutes as needed.    oxybutynin (DITROPAN-XL) 5 MG 24 hr tablet TAKE 1 TABLET AT BEDTIME (Patient taking differently: Take 5 mg by mouth at bedtime.)    pantoprazole (PROTONIX) 40 MG tablet TAKE 1 TABLET (40 MG TOTAL) BY MOUTH DAILY AT 6 AM. (Patient taking differently: Take 40 mg by mouth every morning.)    TRESIBA FLEXTOUCH 100 UNIT/ML FlexTouch Pen INJECT 62 UNITS INTO THE SKIN AT BEDTIME (Patient taking  differently: Inject 64 Units into the skin at bedtime. INJECT 62 UNITS INTO THE SKIN AT BEDTIME)    No facility-administered encounter medications on file as of 08/25/2020.   Recent Relevant Labs: Lab Results  Component Value Date/Time   HGBA1C 7.6 (H) 08/12/2020 12:59 PM   HGBA1C 8.3 (H) 05/15/2020 03:39 PM   HGBA1C 11.3 (H) 07/22/2016 12:17 PM   HGBA1C 8.7 (H) 12/29/2014 08:44 AM   MICROALBUR 349.2 01/07/2020 12:27 PM   MICROALBUR 47.5 10/16/2018 12:38 PM    Kidney Function Lab Results  Component Value Date/Time   CREATININE 0.97 08/12/2020 12:59 PM   CREATININE 1.02 (H)  05/15/2020 03:39 PM   CREATININE 1.01 (H) 01/07/2020 12:27 PM   CREATININE 1.4 (H) 02/26/2015 10:59 AM   CREATININE 1.1 08/22/2014 11:21 AM   GFRNONAA >60 08/12/2020 12:59 PM   GFRNONAA 87 06/18/2014 09:54 AM   GFRAA 59 (L) 02/27/2019 04:02 AM   GFRAA >89 06/18/2014 09:54 AM    Current antihyperglycemic regimen:  Tresiba 62 units hs Metformin 1000 mg twice daily Novolog 12 units three times per day with meals  What recent interventions/DTPs have been made to improve glycemic control:  None.  Have there been any recent hospitalizations or ED visits since last visit with CPP?  Patient stated no.  Patient denies hypoglycemic symptoms, including None   Patient reports hyperglycemic symptoms, including excessive thirst and fatigue   How often are you checking your blood sugar? 3-4 times daily   What are your blood sugars ranging? Patient stated: Sunday 130 AM 140 Lunch 120 PM  Monday-Tuesday 140-150 through out the day   During the week, how often does your blood glucose drop below 70? Patient stated Never   Are you checking your feet daily/regularly?  Patient stated she checks her feet daily/regularly   Adherence Review: Is the patient currently on a STATIN medication? Atorvastatin 40 mg  Is the patient currently on ACE/ARB medication?  Lisinopril 2.5 mg   Does the patient have >5 day gap between last estimated fill dates? Per misc rpts, no.  Reviewed chart prior to disease state call. Spoke with patient regarding BP  Recent Office Vitals: BP Readings from Last 3 Encounters:  08/12/20 134/61  06/09/20 (!) 146/71  05/15/20 136/82   Pulse Readings from Last 3 Encounters:  08/12/20 86  07/23/20 99  06/09/20 94    Wt Readings from Last 3 Encounters:  08/12/20 201 lb (91.2 kg)  07/23/20 203 lb (92.1 kg)  06/09/20 204 lb 12.8 oz (92.9 kg)     Kidney Function Lab Results  Component Value Date/Time   CREATININE 0.97 08/12/2020 12:59 PM   CREATININE 1.02 (H)  05/15/2020 03:39 PM   CREATININE 1.01 (H) 01/07/2020 12:27 PM   CREATININE 1.4 (H) 02/26/2015 10:59 AM   CREATININE 1.1 08/22/2014 11:21 AM   GFRNONAA >60 08/12/2020 12:59 PM   GFRNONAA 87 06/18/2014 09:54 AM   GFRAA 59 (L) 02/27/2019 04:02 AM   GFRAA >89 06/18/2014 09:54 AM    BMP Latest Ref Rng & Units 08/12/2020 05/15/2020 01/07/2020  Glucose 70 - 99 mg/dL 227(H) 152(H) 210(H)  BUN 8 - 23 mg/dL 26(H) 21 22  Creatinine 0.44 - 1.00 mg/dL 0.97 1.02(H) 1.01(H)  BUN/Creat Ratio 6 - 22 (calc) - 21 22  Sodium 135 - 145 mmol/L 138 140 141  Potassium 3.5 - 5.1 mmol/L 4.5 5.1 4.7  Chloride 98 - 111 mmol/L 104 102 103  CO2 22 - 32 mmol/L 23 27 26  Calcium 8.9 - 10.3 mg/dL 9.2 9.2 9.0    Current antihypertensive regimen:  Amlodipine 5 mg daily Isosorbide 30 mg daily Lisinopril 2.5 mg daily  How often are you checking your Blood Pressure? Patient stated she doesn't have a machine at home to check it at this time.   Current home BP readings: Patient stated she doesn't have a machine at home to check it at this time.   What recent interventions/DTPs have been made by any provider to improve Blood Pressure control since last CPP Visit: None.  Any recent hospitalizations or ED visits since last visit with CPP? Patient stated no.  What diet changes have been made to improve Blood Pressure Control?  Patient stated she eats different kinds of meat,all meats are baked. She stated she eats her vegetables and drinks water. She stated she doesn't eat much fruit.    What exercise is being done to improve your Blood Pressure Control?  Patient stated she cooks for herself. She stated she does her daily house chores.  Adherence Review: Is the patient currently on ACE/ARB medication? Lisinopril 2.5 mg   Does the patient have >5 day gap between last estimated fill dates? Per misc rpts, no.  Star Rating Drugs: Atorvastatin 40 mg 1 tablet at bedtime 07/10/20 90 DS , Lisinopril 2.5 mg 2 tablet daily  08/06/20 90 DS, Metformin 1000 mg 1 tablet twice daily 08/09/20 90 DS  Patient stated she has not found her a new PCP at this time. Patient stated she does not have any questions or concerns about her medications at this time.  Follow-Up: Pharmacist Review  Charlann Lange, RMA Clinical Pharmacist Assistant [phone]  6 minutes spent in review, coordination, and documentation.  Reviewed by: Beverly Milch, PharmD Clinical Pharmacist [facility] [phone]

## 2020-08-27 ENCOUNTER — Other Ambulatory Visit: Payer: Self-pay | Admitting: Family Medicine

## 2020-08-28 ENCOUNTER — Ambulatory Visit (HOSPITAL_COMMUNITY): Payer: Medicare PPO | Admitting: Anesthesiology

## 2020-08-28 ENCOUNTER — Ambulatory Visit (HOSPITAL_COMMUNITY)
Admission: RE | Admit: 2020-08-28 | Discharge: 2020-08-28 | Disposition: A | Discharge status: Home / Self Care | Payer: Medicare PPO | Attending: General Surgery | Admitting: General Surgery

## 2020-08-28 ENCOUNTER — Encounter (HOSPITAL_COMMUNITY): Payer: Self-pay | Admitting: General Surgery

## 2020-08-28 ENCOUNTER — Encounter (HOSPITAL_COMMUNITY)
Admission: RE | Disposition: A | Discharge status: Home / Self Care | Payer: Self-pay | Source: Home / Self Care | Attending: General Surgery

## 2020-08-28 ENCOUNTER — Other Ambulatory Visit: Payer: Self-pay

## 2020-08-28 DIAGNOSIS — I2510 Atherosclerotic heart disease of native coronary artery without angina pectoris: Secondary | ICD-10-CM | POA: Diagnosis not present

## 2020-08-28 DIAGNOSIS — Z9049 Acquired absence of other specified parts of digestive tract: Secondary | ICD-10-CM | POA: Diagnosis not present

## 2020-08-28 DIAGNOSIS — Z96642 Presence of left artificial hip joint: Secondary | ICD-10-CM | POA: Insufficient documentation

## 2020-08-28 DIAGNOSIS — I10 Essential (primary) hypertension: Secondary | ICD-10-CM | POA: Insufficient documentation

## 2020-08-28 DIAGNOSIS — Z79899 Other long term (current) drug therapy: Secondary | ICD-10-CM | POA: Diagnosis not present

## 2020-08-28 DIAGNOSIS — Z803 Family history of malignant neoplasm of breast: Secondary | ICD-10-CM | POA: Diagnosis not present

## 2020-08-28 DIAGNOSIS — Z808 Family history of malignant neoplasm of other organs or systems: Secondary | ICD-10-CM | POA: Insufficient documentation

## 2020-08-28 DIAGNOSIS — Z882 Allergy status to sulfonamides status: Secondary | ICD-10-CM | POA: Diagnosis not present

## 2020-08-28 DIAGNOSIS — Z7902 Long term (current) use of antithrombotics/antiplatelets: Secondary | ICD-10-CM | POA: Insufficient documentation

## 2020-08-28 DIAGNOSIS — Z96653 Presence of artificial knee joint, bilateral: Secondary | ICD-10-CM | POA: Insufficient documentation

## 2020-08-28 DIAGNOSIS — C44629 Squamous cell carcinoma of skin of left upper limb, including shoulder: Secondary | ICD-10-CM | POA: Diagnosis not present

## 2020-08-28 DIAGNOSIS — E669 Obesity, unspecified: Secondary | ICD-10-CM | POA: Insufficient documentation

## 2020-08-28 DIAGNOSIS — Z8673 Personal history of transient ischemic attack (TIA), and cerebral infarction without residual deficits: Secondary | ICD-10-CM | POA: Diagnosis not present

## 2020-08-28 DIAGNOSIS — Z801 Family history of malignant neoplasm of trachea, bronchus and lung: Secondary | ICD-10-CM | POA: Insufficient documentation

## 2020-08-28 DIAGNOSIS — Z7982 Long term (current) use of aspirin: Secondary | ICD-10-CM | POA: Diagnosis not present

## 2020-08-28 DIAGNOSIS — Z9012 Acquired absence of left breast and nipple: Secondary | ICD-10-CM | POA: Diagnosis not present

## 2020-08-28 DIAGNOSIS — E785 Hyperlipidemia, unspecified: Secondary | ICD-10-CM | POA: Diagnosis not present

## 2020-08-28 DIAGNOSIS — E1140 Type 2 diabetes mellitus with diabetic neuropathy, unspecified: Secondary | ICD-10-CM | POA: Insufficient documentation

## 2020-08-28 DIAGNOSIS — Z853 Personal history of malignant neoplasm of breast: Secondary | ICD-10-CM | POA: Insufficient documentation

## 2020-08-28 DIAGNOSIS — Z950 Presence of cardiac pacemaker: Secondary | ICD-10-CM | POA: Diagnosis not present

## 2020-08-28 DIAGNOSIS — Z8249 Family history of ischemic heart disease and other diseases of the circulatory system: Secondary | ICD-10-CM | POA: Diagnosis not present

## 2020-08-28 DIAGNOSIS — I25119 Atherosclerotic heart disease of native coronary artery with unspecified angina pectoris: Secondary | ICD-10-CM | POA: Diagnosis not present

## 2020-08-28 DIAGNOSIS — Z955 Presence of coronary angioplasty implant and graft: Secondary | ICD-10-CM | POA: Diagnosis not present

## 2020-08-28 DIAGNOSIS — Z90722 Acquired absence of ovaries, bilateral: Secondary | ICD-10-CM | POA: Insufficient documentation

## 2020-08-28 DIAGNOSIS — Z794 Long term (current) use of insulin: Secondary | ICD-10-CM | POA: Insufficient documentation

## 2020-08-28 HISTORY — PX: MASS EXCISION: SHX2000

## 2020-08-28 LAB — GLUCOSE, CAPILLARY
Glucose-Capillary: 225 mg/dL — ABNORMAL HIGH (ref 70–99)
Glucose-Capillary: 214 mg/dL — ABNORMAL HIGH (ref 70–99)

## 2020-08-28 SURGERY — EXCISION MASS
Anesthesia: General | Laterality: Left

## 2020-08-28 MED ORDER — LIDOCAINE HCL (PF) 2 % IJ SOLN
INTRAMUSCULAR | Status: AC
  Filled 2020-08-28: qty 5

## 2020-08-28 MED ORDER — CEFAZOLIN SODIUM-DEXTROSE 2-4 GM/100ML-% IV SOLN
INTRAVENOUS | Status: AC
  Filled 2020-08-28: qty 100

## 2020-08-28 MED ORDER — LIDOCAINE HCL 1 % IJ SOLN
INTRAMUSCULAR | Status: DC | PRN
  Administered 2020-08-28: 4 mL via INTRADERMAL

## 2020-08-28 MED ORDER — FENTANYL CITRATE (PF) 100 MCG/2ML IJ SOLN
INTRAMUSCULAR | Status: DC | PRN
  Administered 2020-08-28 (×2): 50 ug via INTRAVENOUS

## 2020-08-28 MED ORDER — ONDANSETRON HCL 4 MG/2ML IJ SOLN
INTRAMUSCULAR | Status: DC | PRN
  Administered 2020-08-28: 4 mg via INTRAVENOUS

## 2020-08-28 MED ORDER — LACTATED RINGERS IV SOLN: INTRAVENOUS | Status: DC

## 2020-08-28 MED ORDER — ONDANSETRON HCL 4 MG/2ML IJ SOLN: 4.0000 mg | Freq: Once | INTRAMUSCULAR | Status: DC | PRN

## 2020-08-28 MED ORDER — CEFAZOLIN SODIUM-DEXTROSE 2-4 GM/100ML-% IV SOLN
2.0000 g | INTRAVENOUS | Status: AC
  Administered 2020-08-28: 2 g via INTRAVENOUS

## 2020-08-28 MED ORDER — PROPOFOL 10 MG/ML IV BOLUS
INTRAVENOUS | Status: DC | PRN
  Administered 2020-08-28: 30 mg via INTRAVENOUS
  Administered 2020-08-28: 20 mg via INTRAVENOUS

## 2020-08-28 MED ORDER — TRAMADOL HCL 50 MG PO TABS: 50.0000 mg | ORAL_TABLET | Freq: Four times a day (QID) | ORAL | 0 refills | Status: DC | PRN

## 2020-08-28 MED ORDER — CHLORHEXIDINE GLUCONATE CLOTH 2 % EX PADS: 6.0000 | MEDICATED_PAD | Freq: Once | CUTANEOUS | Status: DC

## 2020-08-28 MED ORDER — LIDOCAINE HCL (PF) 1 % IJ SOLN
INTRAMUSCULAR | Status: AC
  Filled 2020-08-28: qty 30

## 2020-08-28 MED ORDER — LIDOCAINE 2% (20 MG/ML) 5 ML SYRINGE
INTRAMUSCULAR | Status: DC | PRN
  Administered 2020-08-28: 50 mg via INTRAVENOUS

## 2020-08-28 MED ORDER — EPHEDRINE 5 MG/ML INJ
INTRAVENOUS | Status: AC
  Filled 2020-08-28: qty 10

## 2020-08-28 MED ORDER — FENTANYL CITRATE (PF) 100 MCG/2ML IJ SOLN
25.0000 ug | INTRAMUSCULAR | Status: DC | PRN
Start: 2020-08-28 — End: 2020-08-28
  Administered 2020-08-28 (×2): 50 ug via INTRAVENOUS
  Filled 2020-08-28: qty 2

## 2020-08-28 MED ORDER — CHLORHEXIDINE GLUCONATE CLOTH 2 % EX PADS
6.0000 | MEDICATED_PAD | Freq: Once | CUTANEOUS | Status: AC
  Administered 2020-08-28: 6 via TOPICAL

## 2020-08-28 MED ORDER — PROPOFOL 10 MG/ML IV BOLUS
INTRAVENOUS | Status: AC
  Filled 2020-08-28: qty 20

## 2020-08-28 MED ORDER — CHLORHEXIDINE GLUCONATE 0.12 % MT SOLN
15.0000 mL | Freq: Once | OROMUCOSAL | Status: AC
  Administered 2020-08-28: 15 mL via OROMUCOSAL

## 2020-08-28 MED ORDER — ORAL CARE MOUTH RINSE: 15.0000 mL | Freq: Once | OROMUCOSAL | Status: AC

## 2020-08-28 MED ORDER — KETOROLAC TROMETHAMINE 30 MG/ML IJ SOLN
15.0000 mg | Freq: Once | INTRAMUSCULAR | Status: AC
  Administered 2020-08-28: 15 mg via INTRAVENOUS
  Filled 2020-08-28: qty 1

## 2020-08-28 MED ORDER — 0.9 % SODIUM CHLORIDE (POUR BTL) OPTIME
TOPICAL | Status: DC | PRN
  Administered 2020-08-28: 1000 mL

## 2020-08-28 MED ORDER — FENTANYL CITRATE (PF) 100 MCG/2ML IJ SOLN
INTRAMUSCULAR | Status: AC
  Filled 2020-08-28: qty 2

## 2020-08-28 MED ORDER — CHLORHEXIDINE GLUCONATE 0.12 % MT SOLN
OROMUCOSAL | Status: AC
  Filled 2020-08-28: qty 15

## 2020-08-28 MED ORDER — PROPOFOL 500 MG/50ML IV EMUL
INTRAVENOUS | Status: DC | PRN
  Administered 2020-08-28: 50 ug/kg/min via INTRAVENOUS

## 2020-08-28 SURGICAL SUPPLY — 39 items
ADH SKN CLS APL DERMABOND .7 (GAUZE/BANDAGES/DRESSINGS)
APL PRP STRL LF ISPRP CHG 10.5 (MISCELLANEOUS) ×1
APPLICATOR CHLORAPREP 10.5 ORG (MISCELLANEOUS) ×2 IMPLANT
BLADE SURG SZ11 CARB STEEL (BLADE) IMPLANT
BNDG CMPR STD VLCR NS LF 5.8X3 (GAUZE/BANDAGES/DRESSINGS) ×1
BNDG ELASTIC 3X5.8 VLCR NS LF (GAUZE/BANDAGES/DRESSINGS) ×1 IMPLANT
CLOTH BEACON ORANGE TIMEOUT ST (SAFETY) ×2 IMPLANT
COVER LIGHT HANDLE STERIS (MISCELLANEOUS) ×4 IMPLANT
COVER WAND RF STERILE (DRAPES) ×2 IMPLANT
DECANTER SPIKE VIAL GLASS SM (MISCELLANEOUS) ×2 IMPLANT
DERMABOND ADVANCED (GAUZE/BANDAGES/DRESSINGS)
DERMABOND ADVANCED .7 DNX12 (GAUZE/BANDAGES/DRESSINGS) IMPLANT
DRAPE EENT ADH APERT 31X51 STR (DRAPES) IMPLANT
ELECT NDL TIP 2.8 STRL (NEEDLE) IMPLANT
ELECT NEEDLE TIP 2.8 STRL (NEEDLE) IMPLANT
ELECT REM PT RETURN 9FT ADLT (ELECTROSURGICAL) ×2
ELECTRODE REM PT RTRN 9FT ADLT (ELECTROSURGICAL) ×1 IMPLANT
GAUZE SPONGE 4X4 12PLY STRL (GAUZE/BANDAGES/DRESSINGS) ×1 IMPLANT
GLOVE EUDERMIC 6.5 POWDERFREE (GLOVE) ×1 IMPLANT
GLOVE SURG SS PI 7.5 STRL IVOR (GLOVE) ×3 IMPLANT
GLOVE SURG UNDER POLY LF SZ7 (GLOVE) ×4 IMPLANT
GOWN STRL REUS W/TWL LRG LVL3 (GOWN DISPOSABLE) ×4 IMPLANT
KIT TURNOVER KIT A (KITS) ×2 IMPLANT
MANIFOLD NEPTUNE II (INSTRUMENTS) ×2 IMPLANT
NDL HYPO 25X1 1.5 SAFETY (NEEDLE) ×1 IMPLANT
NEEDLE HYPO 25X1 1.5 SAFETY (NEEDLE) ×2 IMPLANT
NS IRRIG 1000ML POUR BTL (IV SOLUTION) ×2 IMPLANT
PACK MINOR (CUSTOM PROCEDURE TRAY) ×2 IMPLANT
PAD ARMBOARD 7.5X6 YLW CONV (MISCELLANEOUS) ×2 IMPLANT
SET BASIN LINEN APH (SET/KITS/TRAYS/PACK) ×2 IMPLANT
SPONGE GAUZE 2X2 8PLY STRL LF (GAUZE/BANDAGES/DRESSINGS) ×2 IMPLANT
SUT ETHILON 3 0 FSL (SUTURE) IMPLANT
SUT MNCRL AB 4-0 PS2 18 (SUTURE) IMPLANT
SUT PROLENE 2 0 FS (SUTURE) ×2 IMPLANT
SUT PROLENE 3 0 PS 1 (SUTURE) ×3 IMPLANT
SUT PROLENE 4 0 PS 2 18 (SUTURE) IMPLANT
SUT VIC AB 3-0 SH 27 (SUTURE)
SUT VIC AB 3-0 SH 27X BRD (SUTURE) IMPLANT
SYR CONTROL 10ML LL (SYRINGE) ×2 IMPLANT

## 2020-08-28 NOTE — H&P (Signed)
Kristen [name]; [id number]; 11-Sep-1945   HPI Patient is a 75 year old white female who was referred to my care by Dr. Allyn Kenner for excision of a squamous cell carcinoma of the left arm.  She had been referred to dermatology but they felt that the reexcision had to be performed in the operating room under sedation.  Patient states the area does itch. Past Medical History:  Diagnosis Date  . Anemia   . Anxiety   . Arthritis   . Bifascicular block   . Blood transfusion without reported diagnosis   . CAD (coronary artery disease)    DES to LAD 02/2019  . Depression   . Diabetic neuropathy ([place])   . Essential hypertension   . GERD (gastroesophageal reflux disease)   . Hemorrhoids   . History of stroke    a. MRI 12/2016 - remote left cerebellar infarcts incidentally noted.  . Hyperlipidemia   . Malignant neoplasm of upper-outer quadrant of left female breast ([place]) 11/30/2012  . Nephrolithiasis   . Obesity   . Overactive bladder   . Personal history of colonic polyps    09/2010 - 2 diminutive adenomas 02/12/2016 5 mm descending polyp - prolapse type polyp (not precancerous) no recall needed given hx, findings and age   . S/P placement of cardiac pacemaker 01/03/2017  . Type 2 diabetes mellitus ([place])   . Vitamin D deficiency     Past Surgical History:  Procedure Laterality Date  . APPENDECTOMY    . BACK SURGERY    . BREAST SURGERY    . CARDIAC CATHETERIZATION  01/2009   cath by Dr Lia Foyer revealed nonobstructive CAD  . CESAREAN SECTION    . CHOLECYSTECTOMY    . CHOLECYSTECTOMY, LAPAROSCOPIC    . COLONOSCOPY     last 2012- with polyps  . CORONARY STENT INTERVENTION N/A 02/26/2019   Procedure: CORONARY STENT INTERVENTION;  Surgeon: Jettie Booze, MD;  Location: [place];  Service: Cardiovascular;  Laterality: N/A;  . INTRAVASCULAR PRESSURE WIRE/FFR STUDY N/A 02/26/2019   Procedure: INTRAVASCULAR PRESSURE WIRE/FFR STUDY;  Surgeon: Jettie Booze, MD;  Location: [facility];  Service: Cardiovascular;  Laterality: N/A;  . JOINT REPLACEMENT    . LEFT HEART CATH AND CORONARY ANGIOGRAPHY N/A 02/26/2019   Procedure: LEFT HEART CATH AND CORONARY ANGIOGRAPHY;  Surgeon: Jettie Booze, MD;  Location: [place] [facility];  Service: Cardiovascular;  Laterality: N/A;  . LITHOTRIPSY    . MASTECTOMY Left 2014  . MASTECTOMY W/ SENTINEL NODE BIOPSY Left 12/24/2012   Procedure: LEFT MASTECTOMY WITH LEFT SENTINEL LYMPH NODE BIOPSY;  Surgeon: Rolm Bookbinder, MD;  Location: [place];  Service: General;  Laterality: Left;  . OOPHORECTOMY Right    1.5  ovaries rem  . PACEMAKER IMPLANT N/A 12/19/2016   Procedure: Pacemaker Implant;  Surgeon: Deboraha Sprang, MD;  Location: [place] [facility];  Service: Cardiovascular;  Laterality: N/A;  . PARTIAL HIP ARTHROPLASTY     left hip  . POLYPECTOMY    . REPLACEMENT TOTAL KNEE     both knees  . TONSILLECTOMY    . TOTAL MASTECTOMY Left 12/24/2012   Dr Donne Hazel    Family History  Problem Relation Age of Onset  . Cancer Mother        mouth cancer  . Breast cancer Mother        possibly dx in her 26s  . Lung cancer Brother 27  . Lung cancer Brother   . Lung  cancer Brother   . Heart attack Father 17  . Breast cancer Sister   . Brain cancer Brother   . Colon cancer Neg Hx   . Colon polyps Neg Hx   . Rectal cancer Neg Hx   . Stomach cancer Neg Hx   . Esophageal cancer Neg Hx     Current Outpatient Medications on File Prior to Visit  Medication Sig Dispense Refill  . ACCU-CHEK AVIVA PLUS test strip USE AS DIRECTED TO MONITOR FINGERSTICK BLOOD SUGAR THREE TIMES DAILY 300 strip 11  . Accu-Chek Softclix Lancets lancets TEST BLOOD SUGAR THREE TIMES DAILY (NEED MD APPOINTMENT) 200 each 0  . amLODipine (NORVASC) 5 MG tablet TAKE 1 TABLET EVERY DAY 90 tablet 3  . aspirin EC 81 MG tablet Take 81 mg by mouth daily.    Marland Kitchen atorvastatin (LIPITOR) 40 MG tablet TAKE 1 TABLET AT BEDTIME 90 tablet 3  . cholecalciferol  (VITAMIN D3) 25 MCG (1000 UNIT) tablet Take 1 tablet (1,000 Units total) by mouth daily.    . clopidogrel (PLAVIX) 75 MG tablet Take 1 tablet (75 mg total) by mouth daily with breakfast. 90 tablet 3  . ezetimibe (ZETIA) 10 MG tablet TAKE 1 TABLET (10 MG TOTAL) BY MOUTH DAILY. 90 tablet 3  . FLUoxetine (PROZAC) 40 MG capsule Take 1 capsule (40 mg total) by mouth daily. 90 capsule 2  . fluticasone (FLONASE) 50 MCG/ACT nasal spray USE 2 SPRAYS IN EACH NOSTRIL EVERY DAY AS NEEDED (SINUS ISSUES) (NEED TO CALL THE CLINIC TO SCHEDULE AN APPT FOR REFILLS) 16 g 3  . gabapentin (NEURONTIN) 300 MG capsule TAKE ONE CAPSULE BY MOUTH 2 TIMES A DAY 180 capsule 3  . insulin aspart (NOVOLOG FLEXPEN) 100 UNIT/ML FlexPen Inject 12 units SQ 3x daily with meals 90 mL 3  . Insulin Pen Needle (UNIFINE PENTIPS) 32G X 4 MM MISC USE TO INJECT TRESIBA AND NOVOLOG EVERY DAY AS DIRECTED 100 each 0  . isosorbide mononitrate (IMDUR) 30 MG 24 hr tablet TAKE 1 TABLET (30 MG TOTAL) BY MOUTH DAILY. 90 tablet 1  . lisinopril (ZESTRIL) 2.5 MG tablet Take 1 tablet (2.5 mg total) by mouth daily. 90 tablet 3  . metFORMIN (GLUCOPHAGE) 1000 MG tablet TAKE 1 TABLET TWICE DAILY WITH MEALS 180 tablet 3  . nitroGLYCERIN (NITROSTAT) 0.4 MG SL tablet Place 1 tablet (0.4 mg total) under the tongue every 5 (five) minutes as needed. 25 tablet 12  . oxybutynin (DITROPAN-XL) 5 MG 24 hr tablet TAKE 1 TABLET AT BEDTIME 90 tablet 3  . pantoprazole (PROTONIX) 40 MG tablet TAKE 1 TABLET (40 MG TOTAL) BY MOUTH DAILY AT 6 AM. 90 tablet 3  . TRESIBA FLEXTOUCH 100 UNIT/ML FlexTouch Pen INJECT 62 UNITS INTO THE SKIN AT BEDTIME 60 mL 3   No current facility-administered medications on file prior to visit.    Allergies  Allergen Reactions  . Sulfonamide Derivatives Hives    Social History   Substance and Sexual Activity  Alcohol Use No    Social History   Tobacco Use  Smoking Status Never Smoker  Smokeless Tobacco Never Used    Review of  Systems  Constitutional: Negative.   HENT: Negative.   Eyes: Negative.   Respiratory: Negative.  Negative for cough.   Cardiovascular: Negative.   Gastrointestinal: Negative.   Genitourinary: Negative.   Musculoskeletal: Positive for back pain and joint pain.  Skin: Positive for itching.  Neurological: Positive for headaches.  Endo/Heme/Allergies: Bruises/bleeds easily.  Psychiatric/Behavioral: Negative.  Objective   Vitals:   07/23/20 1116  Pulse: 99  Resp: 14  Temp: (!) 97 F (36.1 C)  SpO2: 96%    Physical Exam Vitals reviewed.  Constitutional:      Appearance: Normal appearance. She is not ill-appearing.  HENT:     Head: Normocephalic and atraumatic.  Cardiovascular:     Rate and Rhythm: Normal rate and regular rhythm.     Heart sounds: Normal heart sounds. No murmur heard. No friction rub. No gallop.   Pulmonary:     Effort: Pulmonary effort is normal. No respiratory distress.     Breath sounds: Normal breath sounds. No stridor. No wheezing, rhonchi or rales.  Skin:    General: Skin is warm and dry.     Comments: 2 cm ovoid malignant skin lesion at the left antecubital fossa.  Mobile.  Neurological:     Mental Status: She is alert and oriented to person, place, and time.     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  The risks and benefits of the procedure including bleeding and the possible need for reexcision due to unclear margins were fully explained to the patient, who gave informed consent.  She will stop her Plavix 1 week prior to the procedure.  We will discuss with anesthesia whether any adjustments to her pacemaker need to  be done.

## 2020-08-28 NOTE — Anesthesia Preprocedure Evaluation (Addendum)
Anesthesia Evaluation  Patient identified by MRN, date of birth, ID band Patient awake    Reviewed: Allergy & Precautions, NPO status , Patient's Chart, lab work & pertinent test results  History of Anesthesia Complications Negative for: history of anesthetic complications  Airway Mallampati: II  TM Distance: >3 FB Neck ROM: Full    Dental  (+) Edentulous Upper, Edentulous Lower   Pulmonary    Pulmonary exam normal breath sounds clear to auscultation       Cardiovascular Exercise Tolerance: Good hypertension, Pt. on medications + angina + CAD and + Cardiac Stents  Normal cardiovascular exam+ dysrhythmias  Rhythm:Regular Rate:Normal     Neuro/Psych  Headaches, PSYCHIATRIC DISORDERS Anxiety Depression CVA (incidental finding in radiology study)    GI/Hepatic Neg liver ROS, GERD  Medicated and Controlled,  Endo/Other  diabetes, Well Controlled, Type 2, Oral Hypoglycemic Agents, Insulin Dependent  Renal/GU Renal disease     Musculoskeletal  (+) Arthritis , Osteoarthritis,    Abdominal   Peds  Hematology  (+) anemia ,   Anesthesia Other Findings   Reproductive/Obstetrics                             Anesthesia Physical Anesthesia Plan  ASA: III  Anesthesia Plan: General   Post-op Pain Management:    Induction: Intravenous  PONV Risk Score and Plan: 3 and Ondansetron  Airway Management Planned: Nasal Cannula, Natural Airway and Simple Face Mask  Additional Equipment:   Intra-op Plan:   Post-operative Plan:   Informed Consent: I have reviewed the patients History and Physical, chart, labs and discussed the procedure including the risks, benefits and alternatives for the proposed anesthesia with the patient or authorized representative who has indicated his/her understanding and acceptance.       Plan Discussed with: CRNA and Surgeon  Anesthesia Plan Comments:         Anesthesia Quick Evaluation

## 2020-08-28 NOTE — Discharge Instructions (Signed)
PATIENT INSTRUCTIONS POST-ANESTHESIA  IMMEDIATELY FOLLOWING SURGERY:  Do not drive or operate machinery for the first twenty four hours after surgery.  Do not make any important decisions for twenty four hours after surgery or while taking narcotic pain medications or sedatives.  If you develop intractable nausea and vomiting or a severe headache please notify your doctor immediately.  FOLLOW-UP:  Please make an appointment with your surgeon as instructed. You do not need to follow up with anesthesia unless specifically instructed to do so.  WOUND CARE INSTRUCTIONS (if applicable):  Keep a dry clean dressing on the anesthesia/puncture wound site if there is drainage.  Once the wound has quit draining you may leave it open to air.  Generally you should leave the bandage intact for twenty four hours unless there is drainage.  If the epidural site drains for more than 36-48 hours please call the anesthesia department.  QUESTIONS?:  Please feel free to call your physician or the hospital operator if you have any questions, and they will be happy to assist you.       Pain Medicine Instructions You may need pain medicine after an injury or illness. Two common types of pain medicine are: Opioid pain medicine. These may be called opioids. Non-opioid pain medicine. This includes NSAIDs. It is important to follow your doctor's instructions when you are taking pain medicine. Doing this can keep yourself and others safe. How can pain medicine affect me? Pain medicine may not make all of your pain go away. It should make you comfortable enough to: Move. Breathe. Do normal activities. Opioids can cause side effects, such as: Trouble pooping (constipation). Feeling sick to your stomach (nausea). Throwing up (vomiting). Feeling very sleepy. Confusion. Taking the medicine for nonmedical reasons even though taking it hurts your health and well-being (opioid use disorder). Trouble breathing (respiratory  depression). Taking opioids for longer than 3 days raises your risk of these side effects. Taking opioids for a long time can affect how well you can do daily tasks. Taking them for a long time also puts you at risk for: Car crashes. Depression. Suicide. Heart attack. Taking too much of the medicine (overdose). This can lead to death.   What should I do to stay safe while taking pain medicine? Take your medicine as told Take pain medicine exactly as told by your doctor. Take it only when you need it. Write down the times when you take your pain medicine. Look at the times before you take your next dose. Take other over-the-counter or prescription medicines only as told by your doctor. If your pain medicine has acetaminophen in it, do not take any other acetaminophen while you are taking this medicine. Too much can damage the liver. Get pain medicine prescriptions from only one doctor. Avoid certain activities While you are taking prescription pain medicine, and for 8 hours after your last dose: Do not drive. Do not use machinery. Do not use power tools. Do not sign legal documents. Do not drink alcohol. Do not take sleeping pills. Do not take care of children by yourself. Do not do any activities that involve climbing or being in high places. Do not go into any body of water unless there is an adult nearby who can watch you and help you if needed. This includes: [place], rivers, or oceans. Spas or swimming pools.   Keep others safe Store your medicine as told by your doctor. Keep it where children and pets cannot reach it. Do not share  your pain medicine with anyone. Do not save any leftover pills. If you have leftover pills, you can: Bring them to a take-back program. Bring them to a pharmacy that has a drug disposal container. Throw them in the trash. Check the medicine label or package insert to see if it is safe to throw it out. If it is safe, take the medicine out of the  container. Mix it with something that makes it unusable, such as pet waste. Then put the medicine in the trash. General instructions Talk with your doctor about other ways to manage your pain. If you have trouble pooping: Drink enough fluid to keep your pee (urine) pale yellow. Use a poop (stool) softener as told by your doctor. Eat more fruits and vegetables. Keep all follow-up visits as told by your doctor. This is important. Contact a doctor if: Your medicine is not helping with your pain. You have a rash. You feel depressed. Get help right away if: Seek medical care right away if you are taking pain medicines and you (or people close to you) notice any of the following: Trouble breathing. Breathing that is slower than normal. Breathing that is more shallow than normal. Confusion. Sleepiness or trouble staying awake. Feeling sick to your stomach. Throwing up. Your skin or lips turning pale or bluish in color. Tongue swelling. Get help right away if you feel like you may hurt yourself or others, or have thoughts about taking your own life. Go to your nearest emergency room or: Call your local emergency services (911 in the [place] Call the [facility] at 1-800702-063-6864. This is open 24 hours a day. Summary Take your pain medicine exactly as told by your doctor. Pain medicine can help lower your pain. It may also cause side effects. Talk with your doctor about other ways to manage your pain. Follow your doctor's instructions about how to take your pain medicine and keep others safe. Ask what activities you should avoid while taking pain medicine. This information is not intended to replace advice given to you by your health care provider. Make sure you discuss any questions you have with your health care provider. Document Revised: 03/12/2020 Document Reviewed: 03/12/2020 Elsevier Patient Education  2021 [place], Adult An incision is  a cut that a doctor makes in your skin for surgery. Most times, these cuts are closed after surgery. Your cut from surgery may be closed with:  Stitches (sutures).  Staples.  Skin glue.  Skin tape (adhesive) strips. You may need to return to your doctor to have stitches or staples taken out. This may happen many days or many weeks after your surgery. You need to take good care of your cut so it does not get infected. Follow instructions from your doctor about how to care for your cut. Supplies needed:  Soap and water.  A clean hand towel.  Wound cleanser.  A clean bandage (dressing), if needed.  Cream or ointment, if told by your doctor.  Clean gauze. How to care for your cut from surgery Cleaning your cut Ask your doctor how to clean your cut. You may need to:  Use mild soap and water, or a wound cleanser.  Use a clean gauze to pat your cut dry after you clean it. Changing your bandage  Wash your hands with soap and water for at least 20 seconds before and after you change your bandage. If you cannot use soap and water, use hand sanitizer.  Change your bandage as told by your doctor.  Leave stitches, staples, skin glue, or skin tape strips in place. They may need to stay in place for 2 weeks or longer. If tape strips get loose and curl up, you may trim the loose edges. Do not remove tape strips completely unless your doctor says it is okay.  Put a cream or ointment on your cut. Do this only as told.  Cover your cut with a clean bandage.  Ask your doctor when you can leave your cut uncovered. Checking for infection Check your cut area every day for signs of infection. Check for:  More redness, swelling, or pain.  More fluid or blood.  Warmth.  Pus or a bad smell.   Follow these instructions at home Medicines  Take over-the-counter and prescription medicines only as told by your doctor.  If you were prescribed an antibiotic medicine, cream, or ointment, use it  as told by your doctor. Do not stop using the antibiotic even if your condition improves. Eating and drinking  Eat foods that have a lot of certain nutrients, such as protein, vitamin A, and vitamin C. These foods help your cut heal. ? Foods rich in protein include meat, fish, eggs, dairy, beans, and nuts. ? Foods rich in vitamin A include carrots and dark green, leafy vegetables. ? Foods rich in vitamin C include citrus fruits, tomatoes, broccoli, and peppers.  Drink enough fluid to keep your pee (urine) pale yellow. General instructions  Do not take baths, swim, use a hot tub, or put your cut underwater until your doctor approves. Ask your doctor if you may take showers. You may only be allowed to take sponge baths.  Limit movement around your cut. This helps with healing. ? Try not to strain, lift, or exercise for the first 2 weeks, or for as long as told by your doctor. ? Return to your normal activities as told by your doctor. Ask your doctor what activities are safe for you.  Do not scratch or pick at your cut. Keep it covered as told by your doctor.  Protect your cut from the sun when you are outside for the first 6 months, or for as long as told by your doctor. Cover up the scar area or put on sunscreen that has an SPF of at least 30.  Do not use any products that contain nicotine or tobacco, such as cigarettes, e-cigarettes, and chewing tobacco. These can delay cut healing after surgery. If you need help quitting, ask your doctor.  Keep all follow-up visits as told by your doctor. This is important.   Contact a doctor if:  You have any of these signs of infection around your cut: ? More redness, swelling, or pain. ? More fluid or blood. ? Warmth. ? Pus or a bad smell.  You have a fever.  You feel like you may vomit (nauseous).  You vomit.  You are dizzy.  Your stitches, staples, skin glue, or tape strips come undone. Get help right away if:  Your cut has a red  streak coming from it.  Your cut bleeds through your bandage, and bleeding does not stop with gentle pressure.  Your cut opens up and comes apart.  Your body reacts very badly to an infection. This may include: ? A fever, chills, or feeling cold. ? Feeling mixed up, worried, or nervous. ? Very bad pain. ? Trouble breathing. ? A fast heartbeat. ? Clammy or sweaty skin. ? A rash.  These symptoms may be an emergency. Do not wait to see if the symptoms will go away. Get medical help right away. Call your local emergency services (911 in the [place] Do not drive yourself to the hospital. Summary  Follow instructions from your doctor about how to care for your cut from surgery.  Wash your hands with soap and water for at least 20 seconds before and after you change your bandage. If you cannot use soap and water, use hand sanitizer.  Check your cut area every day for signs of infection.  Keep all follow-up visits as told by your doctor. This is important. This information is not intended to replace advice given to you by your health care provider. Make sure you discuss any questions you have with your health care provider. Document Revised: 03/20/2019 Document Reviewed: 03/20/2019 Elsevier Patient Education  [place].

## 2020-08-28 NOTE — Transfer of Care (Signed)
Immediate Anesthesia Transfer of Care Note  Patient: Kristen [name]  Procedure(s) Performed: EXCISION SKIN CANCER LEFT ARM (Left )  Patient Location: PACU  Anesthesia Type:General  Level of Consciousness: awake, alert , oriented and patient cooperative  Airway & Oxygen Therapy: Patient Spontanous Breathing and Patient connected to face mask oxygen  Post-op Assessment: Report given to RN and Post -op Vital signs reviewed and stable  Post vital signs: Reviewed and stable  Last Vitals:  Vitals Value Taken Time  BP 126/69 08/28/20 1300  Temp 36.7 C 08/28/20 1300  Pulse 80 08/28/20 1309  Resp 11 08/28/20 1309  SpO2 100 % 08/28/20 1309  Vitals shown include unvalidated device data.  Last Pain:  Vitals:   08/28/20 1300  TempSrc:   PainSc: Asleep         Complications: No complications documented.

## 2020-08-28 NOTE — Interval H&P Note (Signed)
History and Physical Interval Note:  08/28/2020 11:48 AM  Kristen [name]  has presented today for surgery, with the diagnosis of squamous cell carcinoma left arm.  The various methods of treatment have been discussed with the patient and family. After consideration of risks, benefits and other options for treatment, the patient has consented to  Procedure(s) with comments: EXCISION SKIN CANCER LEFT ARM (Left) - pt tested + on 3/2 <90 days as a surgical intervention.  The patient's history has been reviewed, patient examined, no change in status, stable for surgery.  I have reviewed the patient's chart and labs.  Questions were answered to the patient's satisfaction.     Aviva Signs

## 2020-08-28 NOTE — Op Note (Signed)
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  The risks and benefits of the procedure including bleeding, infection, and the possibility of needing further excision were fully explained to the patient, who gave informed consent.  Procedure note: The patient was placed in supine position.  After monitored anesthesia care was given, the left mid arm was prepped and draped using usual sterile technique with ChloraPrep.  Surgical site confirmation was performed.  1% Xylocaine was used for local anesthesia.  The lesion in question measured approximately greater than 4 cm in diameter.  An elliptical incision was made around the lesion transversely outside the gross neoplastic lesion.  This was taken down to the subcutaneous tissue.  The skin was then removed using Bovie electrocautery without difficulty.  A suture was placed superiorly for orientation purposes.  It was sent to pathology for further examination.  Superior and inferior flaps were then made and the skin was reapproximated using both 2-0 and 3-0 Prolene vertical mattress sutures.  Betadine ointment and a dry sterile dressing were applied.  All tape and needle counts were correct at the end of the procedure.  The patient was awakened and transferred to PACU in stable condition.  Complications: None  EBL: Minimal  Specimen: Squamous cell carcinoma of left arm, suture superior

## 2020-08-28 NOTE — Anesthesia Procedure Notes (Signed)
Date/Time: 08/28/2020 12:28 PM Performed by: Orlie Dakin, CRNA Pre-anesthesia Checklist: Patient identified, Emergency Drugs available, Suction available and Patient being monitored Patient Re-evaluated:Patient Re-evaluated prior to induction Oxygen Delivery Method: Non-rebreather mask Induction Type: IV induction Placement Confirmation: positive ETCO2

## 2020-08-28 NOTE — Anesthesia Postprocedure Evaluation (Signed)
Anesthesia Post Note  Patient: Kristen [name]  Procedure(s) Performed: EXCISION SKIN CANCER LEFT ARM (Left )  Patient location during evaluation: PACU Anesthesia Type: General Level of consciousness: awake and alert and oriented Pain management: pain level controlled Vital Signs Assessment: post-procedure vital signs reviewed and stable Respiratory status: spontaneous breathing and respiratory function stable Cardiovascular status: blood pressure returned to baseline and stable Postop Assessment: no apparent nausea or vomiting Anesthetic complications: no   No complications documented.   Last Vitals:  Vitals:   08/28/20 1400 08/28/20 1420  BP:  128/80  Pulse:  60  Resp:  14  Temp:  36.5 C  SpO2: 99% 93%    Last Pain:  Vitals:   08/28/20 1420  TempSrc: Oral  PainSc: 3                  Lambros Cerro C Sascha Palma

## 2020-08-31 ENCOUNTER — Encounter (HOSPITAL_COMMUNITY): Payer: Self-pay | Admitting: General Surgery

## 2020-08-31 ENCOUNTER — Telehealth: Payer: Self-pay

## 2020-08-31 MED ORDER — EZETIMIBE 10 MG PO TABS: 10.0000 mg | ORAL_TABLET | Freq: Every day | ORAL | 0 refills | Status: DC

## 2020-08-31 NOTE — Telephone Encounter (Signed)
Patient called need med refill  ezetimibe (ZETIA) 10 MG tablet   [facility] - [place], [address]  [place], [place] [address]  Phone:  [phone] Fax:  [phone]

## 2020-08-31 NOTE — Addendum Note (Signed)
Addended by: Amalia Hailey on: 08/31/2020 02:45 PM   Modules accepted: Orders

## 2020-09-02 LAB — SURGICAL PATHOLOGY

## 2020-09-15 ENCOUNTER — Ambulatory Visit (INDEPENDENT_AMBULATORY_CARE_PROVIDER_SITE_OTHER): Payer: Medicare PPO | Admitting: General Surgery

## 2020-09-15 ENCOUNTER — Other Ambulatory Visit: Payer: Self-pay

## 2020-09-15 ENCOUNTER — Encounter: Payer: Self-pay | Admitting: General Surgery

## 2020-09-15 VITALS — BP 114/62 | HR 92 | Temp 97.8°F | Resp 16 | Ht 62.0 in | Wt 204.0 lb

## 2020-09-15 DIAGNOSIS — Z09 Encounter for follow-up examination after completed treatment for conditions other than malignant neoplasm: Secondary | ICD-10-CM

## 2020-09-15 IMAGING — MG MM DIGITAL DIAGNOSTIC UNILAT*R* W/ TOMO W/ CAD
6 series · 6 of 18 positions shown · non-contrast
Comparison: Previous exam(s).

CLINICAL DATA: The patient had a recent palpable lump in the right
breast which has resolved.

EXAM:
DIGITAL DIAGNOSTIC RIGHT MAMMOGRAM WITH CAD AND TOMO
ULTRASOUND RIGHT BREAST

[R MLO synth-2D]
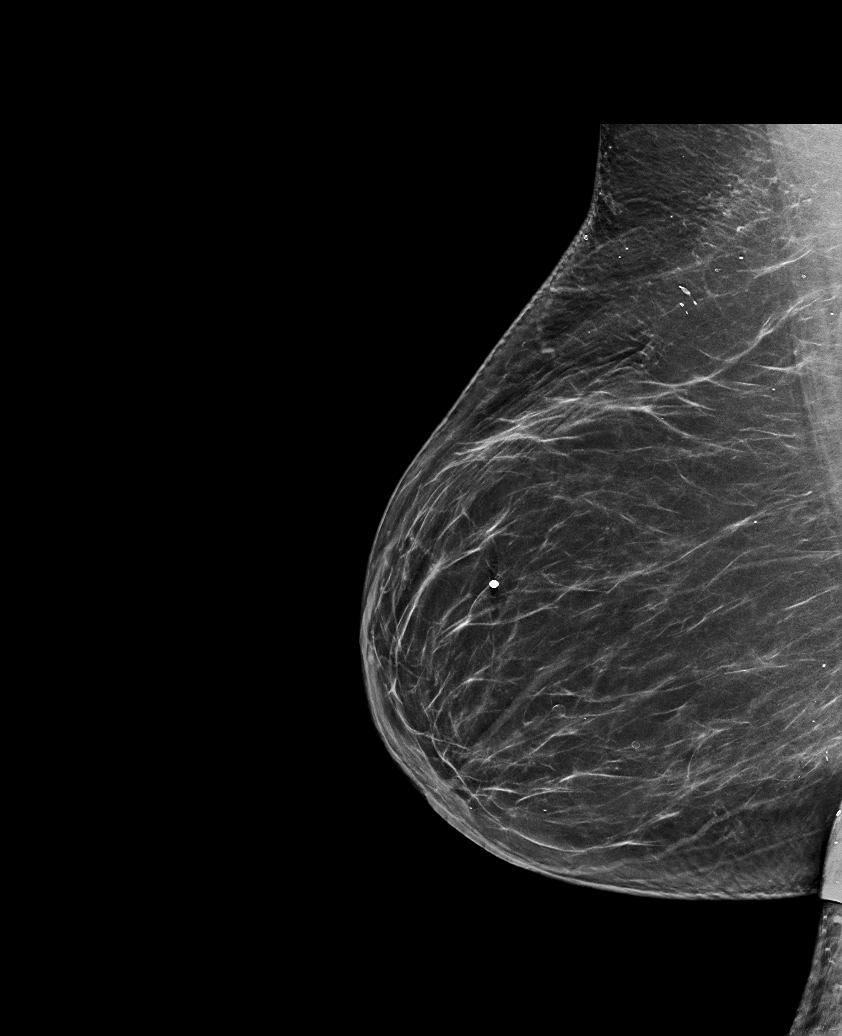

[R CC synth-2D]
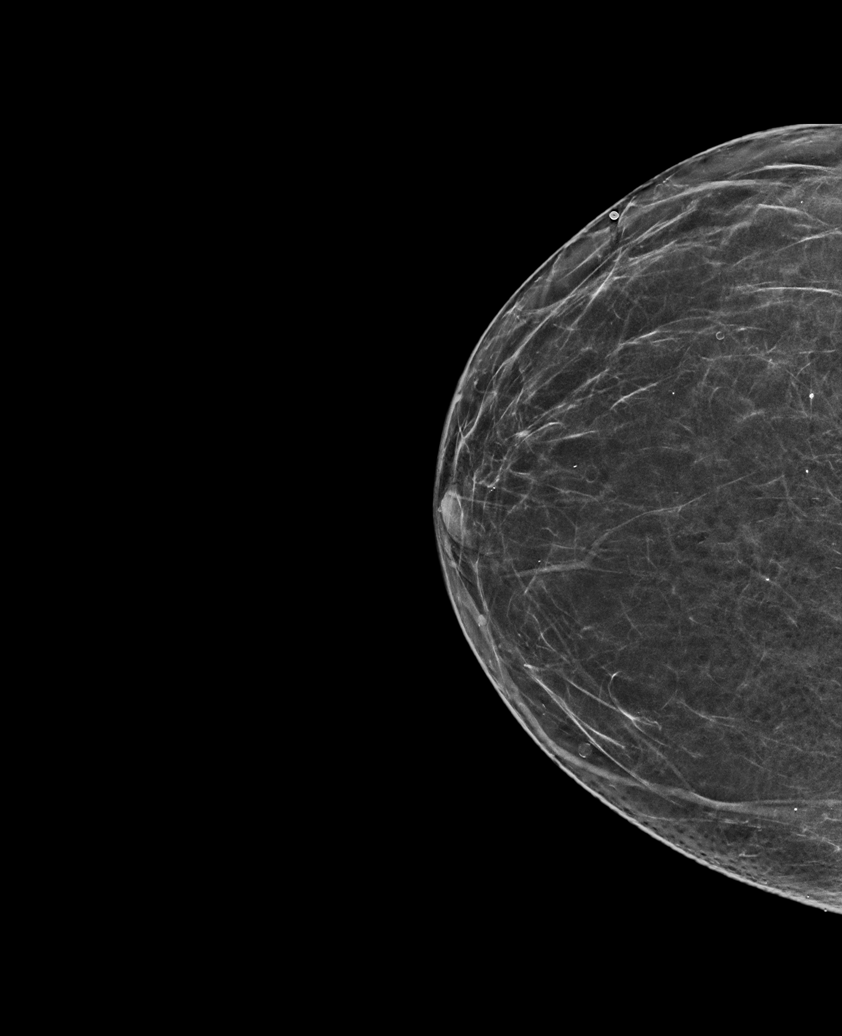

[R TAN synth-2D]
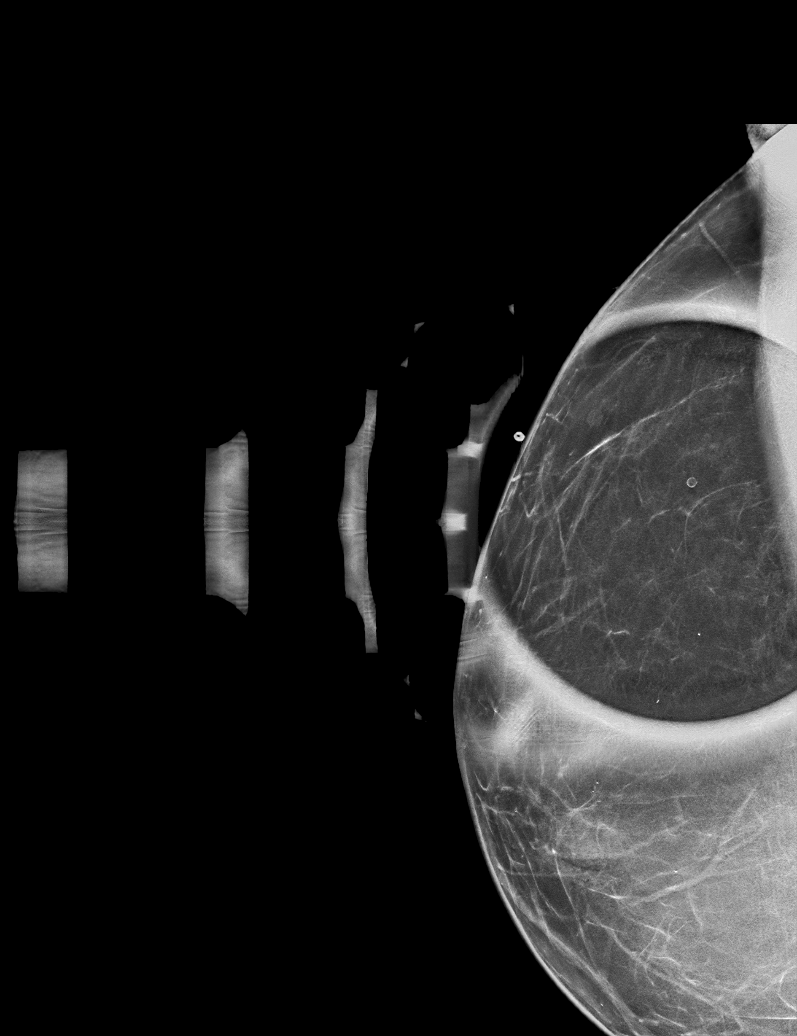

[R TAN tomo · tomo slice 26/51.0]
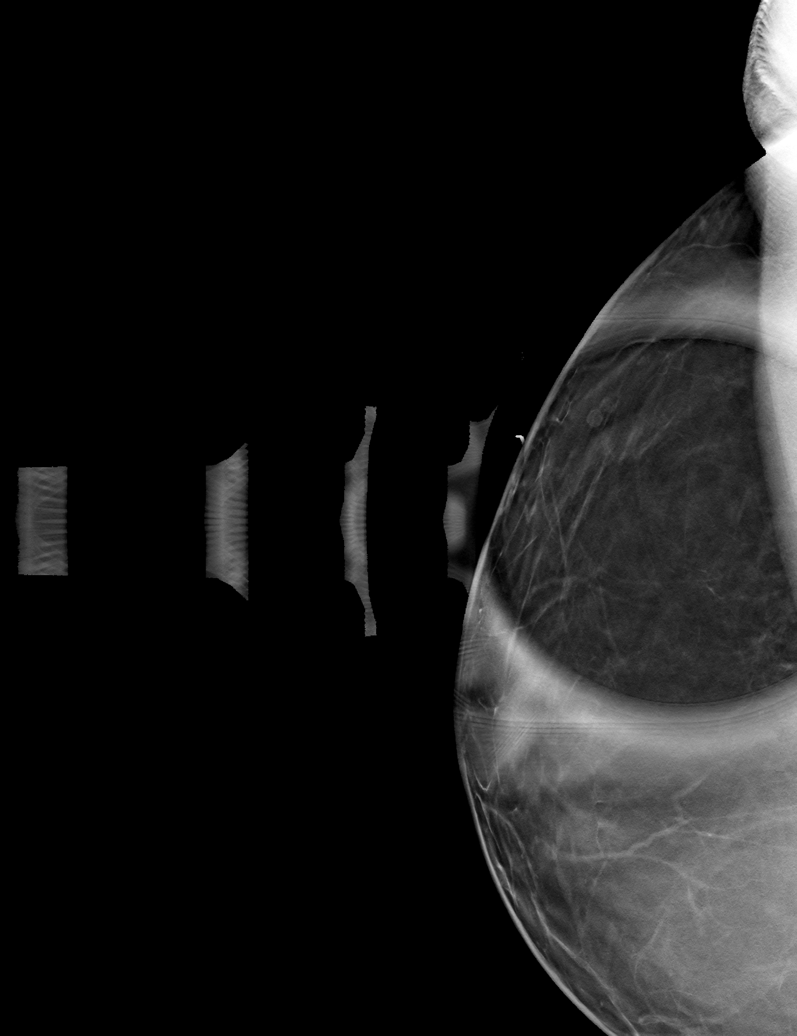

[R CC tomo · tomo slice 32/63.0]
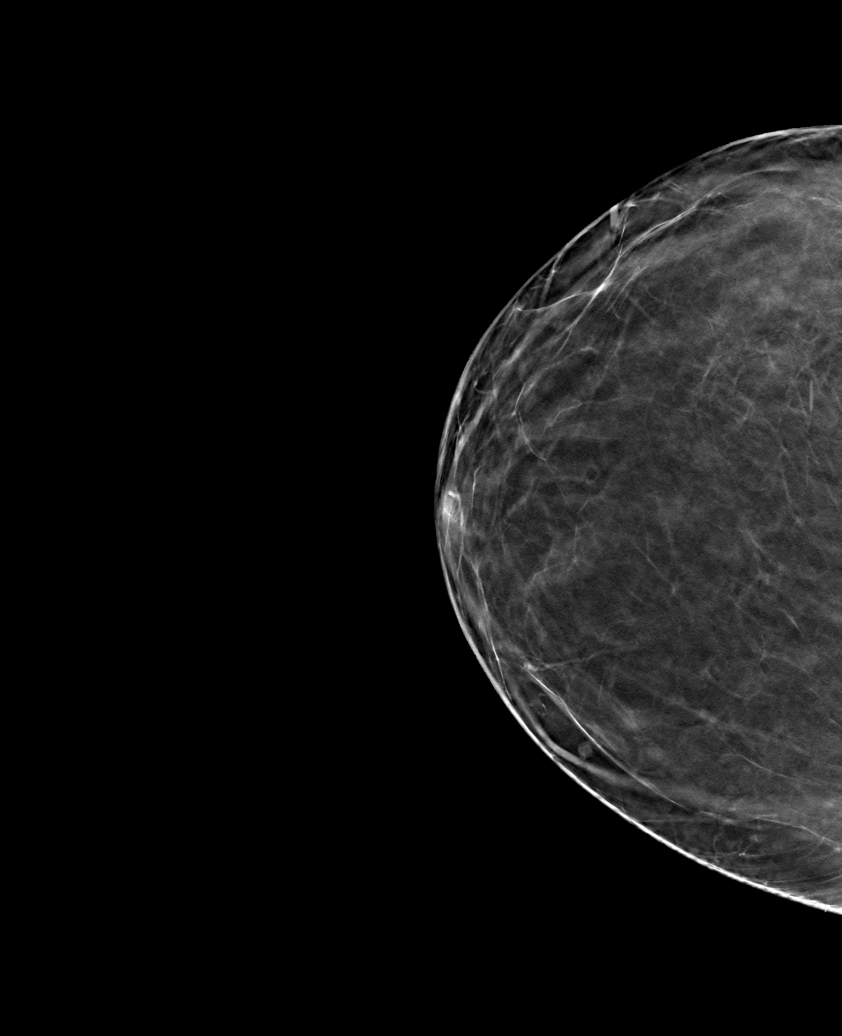

[R MLO tomo · tomo slice 40/79.0]
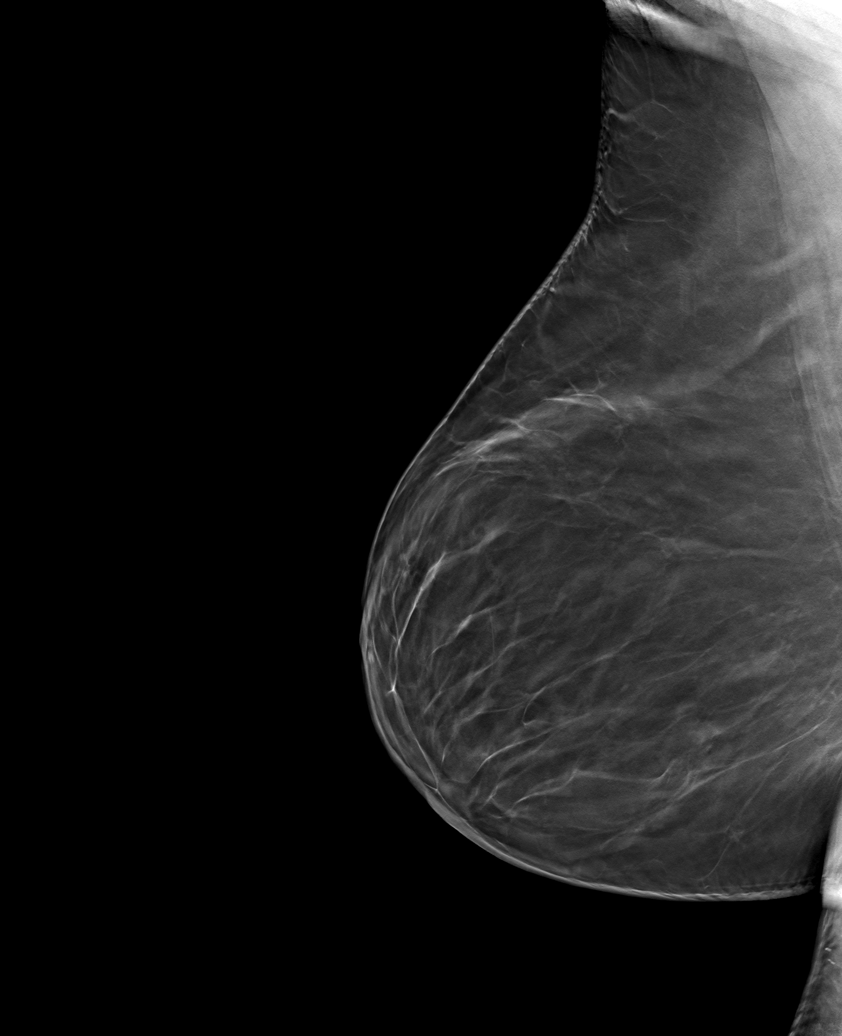

[6 of 18 positions shown; findings below may reference images not displayed]

ACR Breast Density Category b: There are scattered areas of
fibroglandular density.
FINDINGS: No suspicious masses, calcifications, or distortion are seen in the
right breast. A few oil cysts are noted.

Mammographic images were processed with CAD.

On physical exam, no suspicious lumps are identified.

Targeted ultrasound is performed, showing a few scattered oil cysts
but no correlate for the patient's recent palpable lump. There is no
palpable lump today.
IMPRESSION: No mammographic or sonographic evidence of malignancy.

RECOMMENDATION:
Annual screening mammography.

I have discussed the findings and recommendations with the patient.
If applicable, a reminder letter will be sent to the patient
regarding the next appointment.

BI-RADS CATEGORY  2: Benign.

## 2020-09-15 NOTE — Progress Notes (Signed)
Subjective:     Kristen [name]  For postoperative visit, status post excision of malignant skin lesion on the left arm.  Patient is doing well.  She has minimal incisional pain. Objective:    BP 114/62   Pulse 92   Temp 97.8 F (36.6 C) (Other (Comment))   Resp 16   Ht 5\' 2"  (1.575 m)   Wt 204 lb (92.5 kg)   SpO2 95%   BMI 37.31 kg/m   General:  alert, cooperative and no distress  Left arm incision healing well.  Sutures were removed. Final pathology revealed no residual malignancy.     Assessment:    Doing well postoperatively.    Plan:   Follow-up here as needed.

## 2020-09-23 ENCOUNTER — Ambulatory Visit (INDEPENDENT_AMBULATORY_CARE_PROVIDER_SITE_OTHER): Payer: Medicare HMO

## 2020-09-23 DIAGNOSIS — I441 Atrioventricular block, second degree: Secondary | ICD-10-CM

## 2020-09-23 DIAGNOSIS — R55 Syncope and collapse: Secondary | ICD-10-CM | POA: Diagnosis not present

## 2020-09-24 LAB — CUP PACEART REMOTE DEVICE CHECK
Date Time Interrogation Session: 20220412064654
Implantable Lead Implant Date: 20180709
Implantable Lead Implant Date: 20180709
Implantable Lead Location: 753859
Implantable Lead Location: 753860
Implantable Lead Model: 5076
Implantable Lead Model: 5076
Implantable Pulse Generator Implant Date: 20180709
Pulse Gen Model: 407145
Pulse Gen Serial Number: 69106657

## 2020-10-08 NOTE — Progress Notes (Signed)
Remote pacemaker transmission.   

## 2020-10-23 ENCOUNTER — Other Ambulatory Visit: Payer: Self-pay | Admitting: Internal Medicine

## 2020-11-02 ENCOUNTER — Other Ambulatory Visit: Payer: Self-pay | Admitting: Family Medicine

## 2020-11-04 ENCOUNTER — Other Ambulatory Visit: Payer: Self-pay | Admitting: Family Medicine

## 2020-11-11 NOTE — Progress Notes (Signed)
Chronic Care Management Pharmacy Note  11/13/2020 Name:  Edmund Rick MRN:  [id number] DOB:  Nov 15, 1945  Summary: Follow up on BG and BP.  Reports some slightly elevated glucose around 160 fasting.  Denies any recent hypoglycemia.  Reports all glucose levels are < 200.  Has not checked BP lately but controlled in office.  Encouraged her to keep an eye on it at home.  Recommendations/Changes made from today's visit: No recommendations on meds She needs to establish with new PCP for A1c  Plan: Follow up 4 months  Subjective: Kadince Boxley is an 75 y.o. year old female who is a primary patient of No primary care provider on file..  The CCM team was consulted for assistance with disease management and care coordination needs.    Engaged with patient by telephone for follow up visit in response to provider referral for pharmacy case management and/or care coordination services.   Consent to Services:  The patient was given the following information about Chronic Care Management services today, agreed to services, and gave verbal consent: 1. CCM service includes personalized support from designated clinical staff supervised by the primary care provider, including individualized plan of care and coordination with other care providers 2. 24/7 contact phone numbers for assistance for urgent and routine care needs. 3. Service will only be billed when office clinical staff spend 20 minutes or more in a month to coordinate care. 4. Only one practitioner may furnish and bill the service in a calendar month. 5.The patient may stop CCM services at any time (effective at the end of the month) by phone call to the office staff. 6. The patient will be responsible for cost sharing (co-pay) of up to 20% of the service fee (after annual deductible is met). Patient agreed to services and consent obtained.  Patient Care Team: Satira Sark, MD as PCP - Cardiology (Cardiology) Thompson Grayer, MD as PCP -  Electrophysiology (Cardiology) [name], [facility] as Pharmacist (Pharmacist) Allyn Kenner, MD (Dermatology)  Recent office visits: None since last CCM call  Recent consult visits: 04/052/22 Arnoldo Morale) - follow up of excision of skin lesion, doing well overall with minimal pain.  Hospital visits: None in previous 6 months   Objective:  Lab Results  Component Value Date   CREATININE 0.97 08/12/2020   BUN 26 (H) 08/12/2020   GFRNONAA >60 08/12/2020   GFRAA 59 (L) 02/27/2019   NA 138 08/12/2020   K 4.5 08/12/2020   CALCIUM 9.2 08/12/2020   CO2 23 08/12/2020   GLUCOSE 227 (H) 08/12/2020    Lab Results  Component Value Date/Time   HGBA1C 7.6 (H) 08/12/2020 12:59 PM   HGBA1C 8.3 (H) 05/15/2020 03:39 PM   HGBA1C 11.3 (H) 07/22/2016 12:17 PM   HGBA1C 8.7 (H) 12/29/2014 08:44 AM   MICROALBUR 349.2 01/07/2020 12:27 PM   MICROALBUR 47.5 10/16/2018 12:38 PM    Last diabetic Eye exam:  Lab Results  Component Value Date/Time   HMDIABEYEEXA No Retinopathy 02/04/2020 01:48 PM    Last diabetic Foot exam: No results found for: HMDIABFOOTEX   Lab Results  Component Value Date   CHOL 132 05/15/2020   HDL 31 (L) 05/15/2020   LDLCALC 64 05/15/2020   TRIG 279 (H) 05/15/2020   CHOLHDL 4.3 05/15/2020    Hepatic Function Latest Ref Rng & Units 05/15/2020 01/07/2020 09/24/2019  Total Protein 6.1 - 8.1 g/dL 7.0 6.7 7.1  Albumin 3.5 - 5.0 g/dL - - -  AST 10 - 35  U/L _0 ALT 6 - 29 U/L _1 Alk Phosphatase 38 - 126 U/L - - -  Total Bilirubin 0.2 - 1.2 mg/dL 0.5 0.4 0.4  Bilirubin, Direct 0.0 - 0.3 mg/dL - - -    Lab Results  Component Value Date/Time   TSH 1.24 09/24/2019 02:02 PM   TSH 0.877 12/19/2016 09:03 AM   TSH 1.06 07/22/2016 12:17 PM    CBC Latest Ref Rng & Units 08/12/2020 05/15/2020 01/07/2020  WBC 4.0 - 10.5 K/uL 5.6 7.2 6.0  Hemoglobin 12.0 - 15.0 g/dL 12.5 13.0 13.0  Hematocrit 36.0 - 46.0 % 40.9 39.0 39.8  Platelets 150 - 400 K/uL 266 278 240    Lab  Results  Component Value Date/Time   VD25OH 45 05/15/2020 03:39 PM   VD25OH 54 01/07/2020 12:27 PM    Clinical ASCVD: No  The 10-year ASCVD risk score Mikey Bussing DC Jr., et al., 2013) is: 29.2%   Values used to calculate the score:     Age: 75 years     Sex: Female     Is Non-Hispanic African American: No     Diabetic: Yes     Tobacco smoker: No     Systolic Blood Pressure: 606 mmHg     Is BP treated: Yes     HDL Cholesterol: 31 mg/dL     Total Cholesterol: 132 mg/dL    Depression screen [facility] 2/9 05/15/2020 09/24/2019 10/16/2018  Decreased Interest 0 0 0  Down, Depressed, Hopeless 0 0 0  PHQ - 2 Score 0 0 0  Altered sleeping 0 - -  Tired, decreased energy 0 - -  Change in appetite 0 - -  Feeling bad or failure about yourself  0 - -  Trouble concentrating 0 - -  Moving slowly or fidgety/restless 0 - -  Suicidal thoughts 0 - -  PHQ-9 Score 0 - -  Difficult doing work/chores Not difficult at all - -  Some recent data might be hidden      Social History   Tobacco Use  Smoking Status Never Smoker  Smokeless Tobacco Never Used   BP Readings from Last 3 Encounters:  09/15/20 114/62  08/28/20 128/80  08/12/20 134/61   Pulse Readings from Last 3 Encounters:  09/15/20 92  08/28/20 60  08/12/20 86   Wt Readings from Last 3 Encounters:  09/15/20 204 lb (92.5 kg)  08/12/20 201 lb (91.2 kg)  07/23/20 203 lb (92.1 kg)   BMI Readings from Last 3 Encounters:  09/15/20 37.31 kg/m  08/12/20 36.76 kg/m  07/23/20 37.13 kg/m    Assessment/Interventions: Review of patient past medical history, allergies, medications, health status, including review of consultants reports, laboratory and other test data, was performed as part of comprehensive evaluation and provision of chronic care management services.   SDOH:  (Social Determinants of Health) assessments and interventions performed: Yes  SDOH Screenings   Alcohol Screen: Low Risk   . Last Alcohol Screening Score (AUDIT): 0   Depression (PHQ2-9): Low Risk   . PHQ-2 Score: 0  Financial Resource Strain: Low Risk   . Difficulty of Paying Living Expenses: Not very hard  Food Insecurity: Not on file  Housing: Not on file  Physical Activity: Not on file  Social Connections: Not on file  Stress: Not on file  Tobacco Use: Low Risk   . Smoking Tobacco Use: Never Smoker  . Smokeless Tobacco Use: Never Used  Transportation Needs: Not on file  CCM Care Plan  Allergies  Allergen Reactions  . Sulfonamide Derivatives Hives    Medications Reviewed Today    Reviewed by [name], [facility] (Pharmacist) on 11/13/20 at 1226  Med List Status: <None>  Medication Order Taking? Sig Documenting Provider Last Dose Status Informant  ACCU-CHEK AVIVA PLUS test strip [id number] No USE AS DIRECTED TO MONITOR FINGERSTICK BLOOD SUGAR THREE TIMES DAILY Buelah Manis, Modena Nunnery, MD Taking Active Self  Accu-Chek Softclix Lancets lancets [id number]  TEST BLOOD SUGAR THREE TIMES DAILY (NEED MD APPOINTMENT) Susy Frizzle, MD  Active   acetaminophen (TYLENOL) 325 MG tablet [id number] No Take 650 mg by mouth every 6 (six) hours as needed for moderate pain. [provider] Taking Active Self  amLODipine (NORVASC) 5 MG tablet [id number] No TAKE 1 TABLET EVERY DAY  Patient taking differently: Take 5 mg by mouth daily.   Alycia Rossetti, MD Taking Active   aspirin EC 81 MG tablet [id number] No Take 81 mg by mouth daily. [provider] Taking Active Self  atorvastatin (LIPITOR) 40 MG tablet [id number] No TAKE 1 TABLET AT BEDTIME  Patient taking differently: Take 40 mg by mouth at bedtime.   Susy Frizzle, MD Taking Active Self  cholecalciferol (VITAMIN D3) 25 MCG (1000 UNIT) tablet [id number] No Take 1 tablet (1,000 Units total) by mouth daily. [place], Modena Nunnery, MD Taking Active Self  clopidogrel (PLAVIX) 75 MG tablet [id number] No Take 1 tablet (75 mg total) by mouth daily with breakfast. Alycia Rossetti, MD Taking Active  Self           Med Note Ivor Reining Aug 20, 2020  8:31 AM) Will hold starting 08/23/20  ezetimibe (ZETIA) 10 MG tablet [id number] No Take 1 tablet (10 mg total) by mouth daily. [place], Modena Nunnery, MD Taking Active   FLUoxetine (PROZAC) 40 MG capsule [id number] No Take 1 capsule (40 mg total) by mouth daily. [place], Modena Nunnery, MD Taking Active Self  fluticasone (FLONASE) 50 MCG/ACT nasal spray [id number]  USE 2 SPRAYS IN EACH NOSTRIL EVERY DAY AS NEEDED FOR ALLERGIES NO FURTHER REFILLS TO BE GIVEN. NEED TO ESTABLISH NEW PCP Susy Frizzle, MD  Active   gabapentin (NEURONTIN) 300 MG capsule [id number] No TAKE ONE CAPSULE BY MOUTH 2 TIMES A DAY  Patient taking differently: Take 300 mg by mouth 2 (two) times daily. TAKE ONE CAPSULE BY MOUTH 2 TIMES A DAY   [place], Cammie Mcgee, MD Taking Active Self  insulin aspart (NOVOLOG FLEXPEN) 100 UNIT/ML FlexPen [id number] No Inject 12 units SQ 3x daily with meals  Patient taking differently: Inject 14 Units into the skin 3 (three) times daily with meals.   Alycia Rossetti, MD Taking Active   Insulin Pen Needle (UNIFINE PENTIPS) 32G X 4 MM MISC [id number] No USE TO INJECT TRESIBA AND NOVOLOG EVERY DAY AS DIRECTED [place], Modena Nunnery, MD Taking Active Self  isosorbide mononitrate (IMDUR) 30 MG 24 hr tablet [id number]  TAKE 1 TABLET (30 MG TOTAL) BY MOUTH DAILY. Thompson Grayer, MD  Active   lisinopril (ZESTRIL) 2.5 MG tablet [id number]  TAKE 1 TABLET EVERY DAY Susy Frizzle, MD  Active   metFORMIN (GLUCOPHAGE) 1000 MG tablet [id number] No TAKE 1 TABLET TWICE DAILY WITH MEALS  Patient taking differently: Take 1,000 mg by mouth 2 (two) times daily with a meal.   , Modena Nunnery, MD Taking Active   nitroGLYCERIN (NITROSTAT) 0.4 MG SL tablet [id number] No Place 1 tablet (0.4  mg total) under the tongue every 5 (five) minutes as needed. Leanor Kail, PA Taking Active Self  oxybutynin (DITROPAN-XL) 5 MG 24 hr tablet [id number] No TAKE 1 TABLET AT BEDTIME   Patient taking differently: Take 5 mg by mouth at bedtime.   [place], Modena Nunnery, MD Taking Active Self  pantoprazole (PROTONIX) 40 MG tablet [id number] No TAKE 1 TABLET (40 MG TOTAL) BY MOUTH DAILY AT 6 AM.  Patient taking differently: Take 40 mg by mouth every morning.   [place], Modena Nunnery, MD Taking Active   traMADol Veatrice [place]) 50 MG tablet [id number] No Take 1 tablet (50 mg total) by mouth every 6 (six) hours as needed. Aviva Signs, MD Taking Active   TRESIBA FLEXTOUCH 100 UNIT/ML FlexTouch Pen [id number] No INJECT 62 UNITS INTO THE SKIN AT BEDTIME  Patient taking differently: Inject 64 Units into the skin at bedtime. INJECT 92 UNITS INTO THE SKIN AT BEDTIME   Buelah Manis, Modena Nunnery, MD Taking Active Self          Patient Active Problem List   Diagnosis Date Noted  . Squamous cell carcinoma of arm, left   . Migraines 01/07/2020  . Gait instability 09/25/2019  . Angina pectoris ([place]) 02/26/2019  . S/P placement of cardiac pacemaker 01/03/2017  . 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 Personal history of colonic polyps 02/12/2016  . Nasal bone fractures 02/11/2015  . Type 2 diabetes mellitus with neurological complications ([place]) 83/41/9622  . AK (actinic keratosis) 10/21/2013  . Diabetic neuropathy ([place]) 07/24/2013  . Malignant neoplasm of upper-outer quadrant of left female breast ([place]) 11/30/2012  . Vitamin D deficiency 08/24/2012  . RBBB 02/20/2009  . Obesity 02/15/2009  . Anemia 02/15/2009  . DEPRESSION/ANXIETY 02/15/2009  . Essential hypertension 02/15/2009  . CAD (coronary artery disease) 02/15/2009  . GERD 02/15/2009  . Osteoarthritis 02/15/2009  . Hyperlipidemia 02/22/2007    Immunization History  Administered Date(s) Administered  . Fluad Quad(high Dose 65+) 02/27/2019, 05/15/2020  . Influenza Whole 02/16/2011  . Influenza, High Dose Seasonal PF 08/15/2017  . Influenza,inj,Quad PF,6+ Mos 06/18/2014,  02/12/2015, 08/01/2016  . Moderna Sars-Covid-2 Vaccination 12/24/2019, 01/24/2020  . Pneumococcal Conjugate-13 06/18/2014  . Pneumococcal Polysaccharide-23 01/12/2000, 12/26/2012, 12/13/2016    Conditions to be addressed/monitored:  hypertension, CAD,  Type II DM, hyperlipidemia, depression/anxiety  Care Plan : General Pharmacy (Adult)  Updates made by [name], RPH since 11/13/2020 12:00 AM    Problem: HTN, HLD, DM   Priority: High  Onset Date: 11/13/2020    Long-Range Goal: Patient-Specific Goal   Start Date: 11/13/2020  Expected End Date: 05/15/2021  This Visit's Progress: On track  Priority: High  Note:   Current Barriers:  . Unable to achieve control of glucose   Pharmacist Clinical Goal(s):  Marland Kitchen Patient will achieve control of glucose as evidenced by A1c . adhere to plan to optimize therapeutic regimen for DM as evidenced by report of adherence to recommended medication management changes . contact provider office for questions/concerns as evidenced notation of same in electronic health record through collaboration with PharmD and provider.   Interventions: . 1:1 collaboration with No primary care provider on file. regarding development and update of comprehensive plan of care as evidenced by provider attestation and co-signature . Inter-disciplinary care team collaboration (see longitudinal plan of care) . Comprehensive medication review performed; medication list updated in electronic medical record  Hypertension (BP goal <130/80) -Controlled, per office readings -Current treatment: . Amlodipine 21m  daily . Isosorbide mononitrate  . Lisinopril 2.86m daily -Medications previously tried: none noted  -Current home readings: has not checked in a while her daughter has been busy  -Current exercise habits: walks to the mailbox and back -Denies hypotensive/hypertensive symptoms -Educated on BP goals and benefits of medications for prevention of heart attack, stroke and  kidney damage; Daily salt intake goal < 2300 mg; Exercise goal of 150 minutes per week; Importance of home blood pressure monitoring; Symptoms of hypotension and importance of maintaining adequate hydration; -Counseled to monitor BP at home when able, document, and provide log at future appointments -Recommended to continue current medication  Hyperlipidemia/CAD: (LDL goal < 70) -Controlled -Current treatment: . Atorvastatin 49m . Ezetimibe 1074maily -Medications previously tried: none noted  -Educated on Cholesterol goals;  Benefits of statin for ASCVD risk reduction; Importance of limiting foods high in cholesterol;  -Most recent LDL is WNL, she reports 100% adherence with meds -Recommended to continue current medication  Diabetes (A1c goal <7%) -Controlled -Current medications: . MMarland Kitchentformin 1000m67mo times daily with meals . Tresiba 64 units under the skin hs . Novolog 14 units tid with meals -Medications previously tried: none noted  -Current home glucose readings . fasting glucose: 99-160 . post prandial glucose: 180, "nothing over 200" -Denies hypoglycemic/hyperglycemic symptoms -Current meal patterns:  . breakfast: cereal  . lunch: sandwich  . dinner: meat and vegetables -Current exercise: see above -Educated on A1c and blood sugar goals; Prevention and management of hypoglycemic episodes; Benefits of routine self-monitoring of blood sugar; Counseled to check feet daily and get yearly eye exams -Recommended to continue current medication  -Recommend establish with new PCP so that she can get A1c checked as it has been 3 months since her last   Patient Goals/Self-Care Activities . Patient will:  - take medications as prescribed check glucose daily, document, and provide at future appointments check blood pressure weekly, document, and provide at future appointments engage in dietary modifications by limiting carbs and sugars  Follow Up Plan: The care  management team will reach out to the patient again over the next 120 days.        Medication Assistance: None required.  Patient affirms current coverage meets needs. [facility] -[id number]: [phone]: [address] [id number]: [phone]: [phone] pill box? Yes Pt endorses 100% compliance  We discussed: Benefits of medication synchronization, packaging and delivery as well as enhanced pharmacist oversight with [employer]. Patient decided to: Continue current medication management strategy  Care Plan and Follow Up Patient Decision:  Patient agrees to Care Plan and Follow-up.  Plan: The care management team will reach out to the patient again over the next 120 days.  ChriBeverly MilcharmD Clinical Pharmacist [phone]

## 2020-11-13 ENCOUNTER — Ambulatory Visit (INDEPENDENT_AMBULATORY_CARE_PROVIDER_SITE_OTHER): Payer: Medicare HMO | Admitting: Pharmacist

## 2020-11-13 DIAGNOSIS — I10 Essential (primary) hypertension: Secondary | ICD-10-CM | POA: Diagnosis not present

## 2020-11-13 DIAGNOSIS — E1149 Type 2 diabetes mellitus with other diabetic neurological complication: Secondary | ICD-10-CM | POA: Diagnosis not present

## 2020-11-13 DIAGNOSIS — E782 Mixed hyperlipidemia: Secondary | ICD-10-CM | POA: Diagnosis not present

## 2020-11-13 NOTE — Patient Instructions (Addendum)
Visit Information  Goals Addressed            This Visit's Progress   . Monitor and Manage My Blood Sugar-Diabetes Type 2       Timeframe:  Long-Range Goal Priority:  High Start Date:  11/13/20                           Expected End Date:   05/15/21                    Follow Up Date 03/12/21   - check blood sugar at prescribed times - check blood sugar if I feel it is too high or too low - take the blood sugar log to all doctor visits    Why is this important?    Checking your blood sugar at home helps to keep it from getting very high or very low.   Writing the results in a diary or log helps the doctor know how to care for you.   Your blood sugar log should have the time, date and the results.   Also, write down the amount of insulin or other medicine that you take.   Other information, like what you ate, exercise done and how you were feeling, will also be helpful.     Notes:       Patient Care Plan: General Pharmacy (Adult)    Problem Identified: HTN, HLD, DM   Priority: High  Onset Date: 11/13/2020    Long-Range Goal: Patient-Specific Goal   Start Date: 11/13/2020  Expected End Date: 05/15/2021  This Visit's Progress: On track  Priority: High  Note:   Current Barriers:  . Unable to achieve control of glucose   Pharmacist Clinical Goal(s):  Marland Kitchen Patient will achieve control of glucose as evidenced by A1c . adhere to plan to optimize therapeutic regimen for DM as evidenced by report of adherence to recommended medication management changes . contact provider office for questions/concerns as evidenced notation of same in electronic health record through collaboration with PharmD and provider.   Interventions: . 1:1 collaboration with No primary care provider on file. regarding development and update of comprehensive plan of care as evidenced by provider attestation and co-signature . Inter-disciplinary care team collaboration (see longitudinal plan of  care) . Comprehensive medication review performed; medication list updated in electronic medical record  Hypertension (BP goal <130/80) -Controlled, per office readings -Current treatment: . Amlodipine 5mg  daily . Isosorbide mononitrate  . Lisinopril 2.5mg  daily -Medications previously tried: none noted  -Current home readings: has not checked in a while her daughter has been busy  -Current exercise habits: walks to the mailbox and back -Denies hypotensive/hypertensive symptoms -Educated on BP goals and benefits of medications for prevention of heart attack, stroke and kidney damage; Daily salt intake goal < 2300 mg; Exercise goal of 150 minutes per week; Importance of home blood pressure monitoring; Symptoms of hypotension and importance of maintaining adequate hydration; -Counseled to monitor BP at home when able, document, and provide log at future appointments -Recommended to continue current medication  Hyperlipidemia/CAD: (LDL goal < 70) -Controlled -Current treatment: . Atorvastatin 40mg   . Ezetimibe 10mg  daily -Medications previously tried: none noted  -Educated on Cholesterol goals;  Benefits of statin for ASCVD risk reduction; Importance of limiting foods high in cholesterol;  -Most recent LDL is WNL, she reports 100% adherence with meds -Recommended to continue current medication  Diabetes (A1c goal <7%) -Controlled -  Current medications: Marland Kitchen Metformin 1000mg  two times daily with meals . Tresiba 64 units under the skin hs . Novolog 14 units tid with meals -Medications previously tried: none noted  -Current home glucose readings . fasting glucose: 99-160 . post prandial glucose: 180, "nothing over 200" -Denies hypoglycemic/hyperglycemic symptoms -Current meal patterns:  . breakfast: cereal  . lunch: sandwich  . dinner: meat and vegetables -Current exercise: see above -Educated on A1c and blood sugar goals; Prevention and management of hypoglycemic  episodes; Benefits of routine self-monitoring of blood sugar; Counseled to check feet daily and get yearly eye exams -Recommended to continue current medication  -Recommend establish with new PCP so that she can get A1c checked as it has been 3 months since her last   Patient Goals/Self-Care Activities . Patient will:  - take medications as prescribed check glucose daily, document, and provide at future appointments check blood pressure weekly, document, and provide at future appointments engage in dietary modifications by limiting carbs and sugars  Follow Up Plan: The care management team will reach out to the patient again over the next 120 days.        The patient verbalized understanding of instructions, educational materials, and care plan provided today and agreed to receive a mailed copy of patient instructions, educational materials, and care plan.  Telephone follow up appointment with pharmacy team member scheduled for: 4 months  [name], [facility]  Managing Your Hypertension Hypertension, also called high blood pressure, is when the force of the blood pressing against the walls of the arteries is too strong. Arteries are blood vessels that carry blood from your heart throughout your body. Hypertension forces the heart to work harder to pump blood and may cause the arteries to become narrow or stiff. Understanding blood pressure readings Your personal target blood pressure may vary depending on your medical conditions, your age, and other factors. A blood pressure reading includes a higher number over a lower number. Ideally, your blood pressure should be below 120/80. You should know that:  The first, or top, number is called the systolic pressure. It is a measure of the pressure in your arteries as your heart beats.  The second, or bottom number, is called the diastolic pressure. It is a measure of the pressure in your arteries as the heart relaxes. Blood pressure is  classified into four stages. Based on your blood pressure reading, your health care provider may use the following stages to determine what type of treatment you need, if any. Systolic pressure and diastolic pressure are measured in a unit called mmHg. Normal  Systolic pressure: below 329.  Diastolic pressure: below 80. Elevated  Systolic pressure: 518-841.  Diastolic pressure: below 80. Hypertension stage 1  Systolic pressure: 660-630.  Diastolic pressure: 16-01. Hypertension stage 2  Systolic pressure: 093 or above.  Diastolic pressure: 90 or above. How can this condition affect me? Managing your hypertension is an important responsibility. Over time, hypertension can damage the arteries and decrease blood flow to important parts of the body, including the brain, heart, and kidneys. Having untreated or uncontrolled hypertension can lead to:  A heart attack.  A stroke.  A weakened blood vessel (aneurysm).  Heart failure.  Kidney damage.  Eye damage.  Metabolic syndrome.  Memory and concentration problems.  Vascular dementia. What actions can I take to manage this condition? Hypertension can be managed by making lifestyle changes and possibly by taking medicines. Your health care provider will help you make a plan  to bring your blood pressure within a normal range. Nutrition  Eat a diet that is high in fiber and potassium, and low in salt (sodium), added sugar, and fat. An example eating plan is called the Dietary Approaches to Stop Hypertension (DASH) diet. To eat this way: ? Eat plenty of fresh fruits and vegetables. Try to fill one-half of your plate at each meal with fruits and vegetables. ? Eat whole grains, such as whole-wheat pasta, brown rice, or whole-grain bread. Fill about one-fourth of your plate with whole grains. ? Eat low-fat dairy products. ? Avoid fatty cuts of meat, processed or cured meats, and poultry with skin. Fill about one-fourth of your plate  with lean proteins such as fish, chicken without skin, beans, eggs, and tofu. ? Avoid pre-made and processed foods. These tend to be higher in sodium, added sugar, and fat.  Reduce your daily sodium intake. Most people with hypertension should eat less than 1,500 mg of sodium a day.   Lifestyle  Work with your health care provider to maintain a healthy body weight or to lose weight. Ask what an ideal weight is for you.  Get at least 30 minutes of exercise that causes your heart to beat faster (aerobic exercise) most days of the week. Activities may include walking, swimming, or biking.  Include exercise to strengthen your muscles (resistance exercise), such as weight lifting, as part of your weekly exercise routine. Try to do these types of exercises for 30 minutes at least 3 days a week.  Do not use any products that contain nicotine or tobacco, such as cigarettes, e-cigarettes, and chewing tobacco. If you need help quitting, ask your health care provider.  Control any long-term (chronic) conditions you have, such as high cholesterol or diabetes.  Identify your sources of stress and find ways to manage stress. This may include meditation, deep breathing, or making time for fun activities.   Alcohol use  Do not drink alcohol if: ? Your health care provider tells you not to drink. ? You are pregnant, may be pregnant, or are planning to become pregnant.  If you drink alcohol: ? Limit how much you use to:  0-1 drink a day for women.  0-2 drinks a day for men. ? Be aware of how much alcohol is in your drink. In the [place] one drink equals one 12 oz bottle of beer (355 mL), one 5 oz glass of wine (148 mL), or one 1 oz glass of hard liquor (44 mL). Medicines Your health care provider may prescribe medicine if lifestyle changes are not enough to get your blood pressure under control and if:  Your systolic blood pressure is 130 or higher.  Your diastolic blood pressure is 80 or  higher. Take medicines only as told by your health care provider. Follow the directions carefully. Blood pressure medicines must be taken as told by your health care provider. The medicine does not work as well when you skip doses. Skipping doses also puts you at risk for problems. Monitoring Before you monitor your blood pressure:  Do not smoke, drink caffeinated beverages, or exercise within 30 minutes before taking a measurement.  Use the bathroom and empty your bladder (urinate).  Sit quietly for at least 5 minutes before taking measurements. Monitor your blood pressure at home as told by your health care provider. To do this:  Sit with your back straight and supported.  Place your feet flat on the floor. Do not cross your legs.  Support  your arm on a flat surface, such as a table. Make sure your upper arm is at heart level.  Each time you measure, take two or three readings one minute apart and record the results. You may also need to have your blood pressure checked regularly by your health care provider.   General information  Talk with your health care provider about your diet, exercise habits, and other lifestyle factors that may be contributing to hypertension.  Review all the medicines you take with your health care provider because there may be side effects or interactions.  Keep all visits as told by your health care provider. Your health care provider can help you create and adjust your plan for managing your high blood pressure. Where to find more information  National Heart, Lung, and Blood Institute: https://wilson-eaton.com/  American Heart Association: www.heart.org Contact a health care provider if:  You think you are having a reaction to medicines you have taken.  You have repeated (recurrent) headaches.  You feel dizzy.  You have swelling in your ankles.  You have trouble with your vision. Get help right away if:  You develop a severe headache or  confusion.  You have unusual weakness or numbness, or you feel faint.  You have severe pain in your chest or abdomen.  You vomit repeatedly.  You have trouble breathing. These symptoms may represent a serious problem that is an emergency. Do not wait to see if the symptoms will go away. Get medical help right away. Call your local emergency services (911 in the [place] Do not drive yourself to the hospital. Summary  Hypertension is when the force of blood pumping through your arteries is too strong. If this condition is not controlled, it may put you at risk for serious complications.  Your personal target blood pressure may vary depending on your medical conditions, your age, and other factors. For most people, a normal blood pressure is less than 120/80.  Hypertension is managed by lifestyle changes, medicines, or both.  Lifestyle changes to help manage hypertension include losing weight, eating a healthy, low-sodium diet, exercising more, stopping smoking, and limiting alcohol. This information is not intended to replace advice given to you by your health care provider. Make sure you discuss any questions you have with your health care provider. Document Revised: 07/05/2019 Document Reviewed: 04/30/2019 Elsevier Patient Education  2021 [employer].

## 2020-12-11 ENCOUNTER — Other Ambulatory Visit: Payer: Self-pay

## 2020-12-11 ENCOUNTER — Ambulatory Visit
Admission: RE | Admit: 2020-12-11 | Discharge: 2020-12-11 | Disposition: A | Discharge status: Home / Self Care | Payer: Medicare HMO | Source: Ambulatory Visit | Attending: Adult Health | Admitting: Adult Health

## 2020-12-11 DIAGNOSIS — Z1231 Encounter for screening mammogram for malignant neoplasm of breast: Secondary | ICD-10-CM

## 2020-12-11 DIAGNOSIS — Z170 Estrogen receptor positive status [ER+]: Secondary | ICD-10-CM

## 2020-12-23 ENCOUNTER — Ambulatory Visit (INDEPENDENT_AMBULATORY_CARE_PROVIDER_SITE_OTHER): Payer: Medicare HMO

## 2020-12-23 DIAGNOSIS — R55 Syncope and collapse: Secondary | ICD-10-CM

## 2020-12-23 LAB — CUP PACEART REMOTE DEVICE CHECK
Date Time Interrogation Session: 20220713073136
Implantable Lead Implant Date: 20180709
Implantable Lead Implant Date: 20180709
Implantable Lead Location: 753859
Implantable Lead Location: 753860
Implantable Lead Model: 5076
Implantable Lead Model: 5076
Implantable Pulse Generator Implant Date: 20180709
Pulse Gen Model: 407145
Pulse Gen Serial Number: 69106657

## 2020-12-25 ENCOUNTER — Other Ambulatory Visit: Payer: Self-pay | Admitting: Family Medicine

## 2021-01-04 ENCOUNTER — Telehealth: Payer: Self-pay | Admitting: Pharmacist

## 2021-01-04 NOTE — Progress Notes (Addendum)
Chronic Care Management Pharmacy Assistant   Name: Kristen [name]  MRN: [id number] DOB: [birth year]  Reason for Encounter: Disease State For DM.   Conditions to be addressed/monitored: hypertension, CAD,  Type II DM, hyperlipidemia, depression/anxiety  Recent office visits:  None since 11/13/20  Recent consult visits:  None since 11/13/20  Hospital visits:  None since 11/13/20  Medications: Outpatient Encounter Medications as of 01/04/2021  Medication Sig Note   ACCU-CHEK AVIVA PLUS test strip USE AS DIRECTED TO MONITOR FINGERSTICK BLOOD SUGAR THREE TIMES DAILY    Accu-Chek Softclix Lancets lancets TEST BLOOD SUGAR THREE TIMES DAILY (NEED MD APPOINTMENT)    acetaminophen (TYLENOL) 325 MG tablet Take 650 mg by mouth every 6 (six) hours as needed for moderate pain.    amLODipine (NORVASC) 5 MG tablet TAKE 1 TABLET EVERY DAY (Patient taking differently: Take 5 mg by mouth daily.)    aspirin EC 81 MG tablet Take 81 mg by mouth daily.    atorvastatin (LIPITOR) 40 MG tablet TAKE 1 TABLET AT BEDTIME (Patient taking differently: Take 40 mg by mouth at bedtime.)    cholecalciferol (VITAMIN D3) 25 MCG (1000 UNIT) tablet Take 1 tablet (1,000 Units total) by mouth daily.    clopidogrel (PLAVIX) 75 MG tablet Take 1 tablet (75 mg total) by mouth daily with breakfast. 08/20/2020: Will hold starting 08/23/20   ezetimibe (ZETIA) 10 MG tablet Take 1 tablet (10 mg total) by mouth daily.    FLUoxetine (PROZAC) 40 MG capsule Take 1 capsule (40 mg total) by mouth daily.    fluticasone (FLONASE) 50 MCG/ACT nasal spray USE 2 SPRAYS IN EACH NOSTRIL EVERY DAY AS NEEDED FOR ALLERGIES NO FURTHER REFILLS TO BE GIVEN. NEED TO ESTABLISH NEW PCP    gabapentin (NEURONTIN) 300 MG capsule TAKE ONE CAPSULE BY MOUTH 2 TIMES A DAY (Patient taking differently: Take 300 mg by mouth 2 (two) times daily. TAKE ONE CAPSULE BY MOUTH 2 TIMES A DAY)    insulin aspart (NOVOLOG FLEXPEN) 100 UNIT/ML FlexPen Inject 12 units SQ 3x daily  with meals (Patient taking differently: Inject 14 Units into the skin 3 (three) times daily with meals.)    Insulin Pen Needle (UNIFINE PENTIPS) 32G X 4 MM MISC USE TO INJECT TRESIBA AND NOVOLOG EVERY DAY AS DIRECTED    isosorbide mononitrate (IMDUR) 30 MG 24 hr tablet TAKE 1 TABLET (30 MG TOTAL) BY MOUTH DAILY.    lisinopril (ZESTRIL) 2.5 MG tablet TAKE 1 TABLET EVERY DAY    metFORMIN (GLUCOPHAGE) 1000 MG tablet TAKE 1 TABLET TWICE DAILY WITH MEALS (Patient taking differently: Take 1,000 mg by mouth 2 (two) times daily with a meal.)    nitroGLYCERIN (NITROSTAT) 0.4 MG SL tablet Place 1 tablet (0.4 mg total) under the tongue every 5 (five) minutes as needed.    oxybutynin (DITROPAN-XL) 5 MG 24 hr tablet TAKE 1 TABLET AT BEDTIME (Patient taking differently: Take 5 mg by mouth at bedtime.)    pantoprazole (PROTONIX) 40 MG tablet TAKE 1 TABLET (40 MG TOTAL) BY MOUTH DAILY AT 6 AM. (Patient taking differently: Take 40 mg by mouth every morning.)    traMADol (ULTRAM) 50 MG tablet Take 1 tablet (50 mg total) by mouth every 6 (six) hours as needed.    TRESIBA FLEXTOUCH 100 UNIT/ML FlexTouch Pen INJECT 62 UNITS INTO THE SKIN AT BEDTIME (Patient taking differently: Inject 64 Units into the skin at bedtime. INJECT 62 UNITS INTO THE SKIN AT BEDTIME)    No facility-administered encounter medications  on file as of 01/04/2021.    Recent Relevant Labs: Lab Results  Component Value Date/Time   HGBA1C 7.6 (H) 08/12/2020 12:59 PM   HGBA1C 8.3 (H) 05/15/2020 03:39 PM   HGBA1C 11.3 (H) 07/22/2016 12:17 PM   HGBA1C 8.7 (H) 12/29/2014 08:44 AM   MICROALBUR 349.2 01/07/2020 12:27 PM   MICROALBUR 47.5 10/16/2018 12:38 PM    Kidney Function Lab Results  Component Value Date/Time   CREATININE 0.97 08/12/2020 12:59 PM   CREATININE 1.02 (H) 05/15/2020 03:39 PM   CREATININE 1.01 (H) 01/07/2020 12:27 PM   CREATININE 1.4 (H) 02/26/2015 10:59 AM   CREATININE 1.1 08/22/2014 11:21 AM   GFRNONAA >60 08/12/2020 12:59 PM    GFRNONAA 87 06/18/2014 09:54 AM   GFRAA 59 (L) 02/27/2019 04:02 AM   GFRAA >89 06/18/2014 09:54 AM    Current antihyperglycemic regimen:  Metformin '1000mg'$  two times daily with meals Tresiba 64 units under the skin hs Novolog 14 units tid with meals  What recent interventions/DTPs have been made to improve glycemic control:  None.  Have there been any recent hospitalizations or ED visits since last visit with CPP? None.   Patient denies hypoglycemic symptoms, including None  Patient reports hyperglycemic symptoms, including none  How often are you checking your blood sugar? Patient stated 3-4 times daily.  What are your blood sugars ranging?  Patient stated her blood sugar ranges around 140-160  During the week, how often does your blood glucose drop below 70? Patient stated Never  Are you checking your feet daily/regularly?  Patient stated she checks her feet regularly.   Adherence Review: Is the patient currently on a STATIN medication? Atorvastatin 40 mg   Is the patient currently on ACE/ARB medication? Lisinopril 2.5 mg  Does the patient have >5 day gap between last estimated fill dates? Per misc rtps, no.   Star Rating Drugs: Lisinopril 2.5 mg 90 DS 11/05/20, Metformin 1000 mg 90 DS 10/20/20, Atorvastatin 40 mg 90 DS 09/21/20, Gabapentin 300 mg 90 DS 09/24/20.  Patient stated she is is running low on her medications and still hasn't found her a new PCP. I spoke with her daughter and helped her look for a new PCP.   Follow-Up:Pharmacist Review  Charlann Lange, RMA Clinical Pharmacist Assistant [phone]   10 minutes spent in review, coordination, and documentation.  Reviewed by: Beverly Milch, PharmD Clinical Pharmacist [phone]

## 2021-01-08 ENCOUNTER — Other Ambulatory Visit: Payer: Self-pay | Admitting: Family Medicine

## 2021-01-13 DIAGNOSIS — N3281 Overactive bladder: Secondary | ICD-10-CM | POA: Diagnosis not present

## 2021-01-13 DIAGNOSIS — E1165 Type 2 diabetes mellitus with hyperglycemia: Secondary | ICD-10-CM | POA: Diagnosis not present

## 2021-01-13 DIAGNOSIS — Z789 Other specified health status: Secondary | ICD-10-CM | POA: Diagnosis not present

## 2021-01-13 DIAGNOSIS — Z299 Encounter for prophylactic measures, unspecified: Secondary | ICD-10-CM | POA: Diagnosis not present

## 2021-01-13 DIAGNOSIS — I25119 Atherosclerotic heart disease of native coronary artery with unspecified angina pectoris: Secondary | ICD-10-CM | POA: Diagnosis not present

## 2021-01-13 DIAGNOSIS — Z6834 Body mass index (BMI) 34.0-34.9, adult: Secondary | ICD-10-CM | POA: Diagnosis not present

## 2021-01-13 DIAGNOSIS — I10 Essential (primary) hypertension: Secondary | ICD-10-CM | POA: Diagnosis not present

## 2021-01-15 ENCOUNTER — Ambulatory Visit (INDEPENDENT_AMBULATORY_CARE_PROVIDER_SITE_OTHER): Payer: Medicare HMO | Admitting: Internal Medicine

## 2021-01-15 ENCOUNTER — Other Ambulatory Visit: Payer: Self-pay

## 2021-01-15 VITALS — BP 116/62 | HR 79 | Ht 62.0 in | Wt 206.0 lb

## 2021-01-15 DIAGNOSIS — I10 Essential (primary) hypertension: Secondary | ICD-10-CM | POA: Diagnosis not present

## 2021-01-15 DIAGNOSIS — I4510 Unspecified right bundle-branch block: Secondary | ICD-10-CM | POA: Diagnosis not present

## 2021-01-15 DIAGNOSIS — I441 Atrioventricular block, second degree: Secondary | ICD-10-CM

## 2021-01-15 LAB — CUP PACEART INCLINIC DEVICE CHECK
Brady Statistic RA Percent Paced: 51 %
Brady Statistic RV Percent Paced: 28 %
Date Time Interrogation Session: 20220805101559
Implantable Lead Implant Date: 20180709
Implantable Lead Implant Date: 20180709
Implantable Lead Location: 753859
Implantable Lead Location: 753860
Implantable Lead Model: 5076
Implantable Lead Model: 5076
Implantable Pulse Generator Implant Date: 20180709
Lead Channel Impedance Value: 370 Ohm
Lead Channel Impedance Value: 643 Ohm
Lead Channel Pacing Threshold Amplitude: 0.5 V
Lead Channel Pacing Threshold Amplitude: 0.7 V
Lead Channel Pacing Threshold Pulse Width: 0.4 ms
Lead Channel Pacing Threshold Pulse Width: 0.4 ms
Lead Channel Sensing Intrinsic Amplitude: 17.6 mV
Lead Channel Sensing Intrinsic Amplitude: 6.2 mV
Lead Channel Setting Pacing Amplitude: 2 V
Lead Channel Setting Pacing Amplitude: 2.4 V
Lead Channel Setting Pacing Pulse Width: 0.4 ms
Pulse Gen Model: 407145
Pulse Gen Serial Number: 69106657

## 2021-01-15 NOTE — Progress Notes (Signed)
PCP: Kristen Chroman, Kristen [name] Primary Cardiologist: Kristen [name] Primary EP:  Kristen [name] is a 75 y.o. female who presents today for routine electrophysiology followup.  Since last being seen in our clinic, the patient reports doing reasonably well.  She is not very active.  She does not drive and therefore "does not get out much".  She goes to church on Sundays.  Stable angina and SOB.  Today, she denies symptoms of palpitations, dizziness, presyncope, or syncope.  The patient is otherwise without complaint today.   Past Medical History:  Diagnosis Date   Anemia    Anxiety    Arthritis    Bifascicular block    Blood transfusion without reported diagnosis    CAD (coronary artery disease)    DES to LAD 02/2019   Depression    Diabetic neuropathy (HCC)    Essential hypertension    GERD (gastroesophageal reflux disease)    Hemorrhoids    History of stroke    a. MRI 12/2016 - remote left cerebellar infarcts incidentally noted.   Hyperlipidemia    Malignant neoplasm of upper-outer quadrant of left female breast ([place]) 11/30/2012   Nephrolithiasis    Obesity    Overactive bladder    Personal history of colonic polyps    09/2010 - 2 diminutive adenomas 02/12/2016 5 mm descending polyp - prolapse type polyp (not precancerous) no recall needed given hx, findings and age    S/P placement of cardiac pacemaker 01/03/2017   Type 2 diabetes mellitus ([place])    Vitamin D deficiency    Past Surgical History:  Procedure Laterality Date   APPENDECTOMY     BACK SURGERY     BREAST SURGERY     CARDIAC CATHETERIZATION  01/2009   cath by Kristen Lia Foyer revealed nonobstructive CAD   [place], LAPAROSCOPIC     COLONOSCOPY     last 2012- with polyps   CORONARY STENT INTERVENTION N/A 02/26/2019   Procedure: CORONARY STENT INTERVENTION;  Surgeon: Jettie Booze, Kristen [name];  Location: [facility];  Service: Cardiovascular;  Laterality: N/A;    INTRAVASCULAR PRESSURE WIRE/FFR STUDY N/A 02/26/2019   Procedure: INTRAVASCULAR PRESSURE WIRE/FFR STUDY;  Surgeon: Jettie Booze, Kristen [name];  Location: [facility];  Service: Cardiovascular;  Laterality: N/A;   JOINT REPLACEMENT     LEFT HEART CATH AND CORONARY ANGIOGRAPHY N/A 02/26/2019   Procedure: LEFT HEART CATH AND CORONARY ANGIOGRAPHY;  Surgeon: Jettie Booze, Kristen [name];  Location: [facility];  Service: Cardiovascular;  Laterality: N/A;   LITHOTRIPSY     MASS EXCISION Left 08/28/2020   Procedure: EXCISION SKIN CANCER LEFT ARM;  Surgeon: Aviva Signs, Kristen [name];  Location: [facility];  Service: General;  Laterality: Left;   MASTECTOMY Left 12/24/2012   MASTECTOMY W/ SENTINEL NODE BIOPSY Left 12/24/2012   Procedure: LEFT MASTECTOMY WITH LEFT SENTINEL LYMPH NODE BIOPSY;  Surgeon: Rolm Bookbinder, Kristen [name];  Location: [facility];  Service: General;  Laterality: Left;   OOPHORECTOMY Right    1.5  ovaries rem   PACEMAKER IMPLANT N/A 12/19/2016   Procedure: Pacemaker Implant;  Surgeon: Deboraha Sprang, Kristen [name];  Location: [facility];  Service: Cardiovascular;  Laterality: N/A;   PARTIAL HIP ARTHROPLASTY     left hip   POLYPECTOMY     REPLACEMENT TOTAL KNEE     both knees   TONSILLECTOMY     TOTAL MASTECTOMY Left 12/24/2012  Kristen Donne Hazel    ROS- all systems are reviewed and negative except as per HPI above  Current Outpatient Medications  Medication Sig Dispense Refill   ACCU-CHEK AVIVA PLUS test strip USE AS DIRECTED TO MONITOR FINGERSTICK BLOOD SUGAR THREE TIMES DAILY 300 strip 11   Accu-Chek Softclix Lancets lancets TEST BLOOD SUGAR THREE TIMES DAILY (NEED Kristen [name] APPOINTMENT) 200 each 0   acetaminophen (TYLENOL) 325 MG tablet Take 650 mg by mouth every 6 (six) hours as needed for moderate pain.     amLODipine (NORVASC) 5 MG tablet TAKE 1 TABLET EVERY DAY 90 tablet 3   aspirin EC 81 MG tablet Take 81 mg by mouth daily.     atorvastatin (LIPITOR) 40 MG tablet TAKE 1 TABLET AT BEDTIME 90  tablet 3   cholecalciferol (VITAMIN D3) 25 MCG (1000 UNIT) tablet Take 1 tablet (1,000 Units total) by mouth daily.     clopidogrel (PLAVIX) 75 MG tablet Take 1 tablet (75 mg total) by mouth daily with breakfast. 90 tablet 3   ezetimibe (ZETIA) 10 MG tablet Take 1 tablet (10 mg total) by mouth daily. 30 tablet 0   FLUoxetine (PROZAC) 40 MG capsule Take 1 capsule (40 mg total) by mouth daily. 90 capsule 2   fluticasone (FLONASE) 50 MCG/ACT nasal spray USE 2 SPRAYS IN EACH NOSTRIL EVERY DAY AS NEEDED FOR ALLERGIES NO FURTHER REFILLS TO BE GIVEN. NEED TO ESTABLISH NEW PCP 48 g 0   gabapentin (NEURONTIN) 300 MG capsule TAKE ONE CAPSULE BY MOUTH 2 TIMES A DAY 180 capsule 3   insulin aspart (NOVOLOG FLEXPEN) 100 UNIT/ML FlexPen Inject 12 units SQ 3x daily with meals 90 mL 3   Insulin Pen Needle (UNIFINE PENTIPS) 32G X 4 MM MISC USE TO INJECT TRESIBA AND NOVOLOG EVERY DAY AS DIRECTED 100 each 0   isosorbide mononitrate (IMDUR) 30 MG 24 hr tablet TAKE 1 TABLET (30 MG TOTAL) BY MOUTH DAILY. 90 tablet 0   lisinopril (ZESTRIL) 2.5 MG tablet TAKE 1 TABLET EVERY DAY 90 tablet 0   metFORMIN (GLUCOPHAGE) 1000 MG tablet TAKE 1 TABLET TWICE DAILY WITH MEALS 180 tablet 3   nitroGLYCERIN (NITROSTAT) 0.4 MG SL tablet Place 1 tablet (0.4 mg total) under the tongue every 5 (five) minutes as needed. 25 tablet 12   oxybutynin (DITROPAN-XL) 5 MG 24 hr tablet TAKE 1 TABLET AT BEDTIME 90 tablet 3   pantoprazole (PROTONIX) 40 MG tablet TAKE 1 TABLET (40 MG TOTAL) BY MOUTH DAILY AT 6 AM. 90 tablet 3   traMADol (ULTRAM) 50 MG tablet Take 1 tablet (50 mg total) by mouth every 6 (six) hours as needed. 20 tablet 0   TRESIBA FLEXTOUCH 100 UNIT/ML FlexTouch Pen INJECT 62 UNITS INTO THE SKIN AT BEDTIME 60 mL 3   No current facility-administered medications for this visit.    Physical Exam: Vitals:   01/15/21 1009  BP: 116/62  Pulse: 79  SpO2: 94%  Weight: 206 lb (93.4 kg)  Height: '5\' 2"'$  (1.575 m)    GEN- The patient is  overweight and chronically ill appearing, alert and oriented x 3 today.   Head- normocephalic, atraumatic Eyes-  Sclera clear, conjunctiva pink Ears- hearing intact Oropharynx- clear Lungs- Clear to ausculation bilaterally, normal work of breathing Chest- pacemaker pocket is well healed Heart- Regular rate and rhythm, no murmurs, rubs or gallops, PMI not laterally displaced GI- soft, NT, ND, + BS Extremities- no clubbing, cyanosis, or edema  Pacemaker interrogation- reviewed in detail today,  See PACEART  report  ekg tracing ordered today is personally reviewed and shows AV paced  Assessment and Plan:  1. Symptomatic second degree AV block heart block Normal pacemaker function See Pace Art report No changes today she is not device dependant today  2. CAD S/p LAD PCI 2000 Stable canadian class II angina No changes  3. HTN Continue amlodipine '5mg'$  daily  4. HL Continue zetia '10mg'$  daily, lipitor '40mg'$  daily  5. Obesity Body mass index is 37.68 kg/m. Lifestyle modification is advised  Risks, benefits and potential toxicities for medications prescribed and/or refilled reviewed with patient today.   Return in a year  Kristen Grayer Kristen [name], [facility] 01/15/2021 10:43 AM

## 2021-01-15 NOTE — Progress Notes (Signed)
Remote pacemaker transmission.   

## 2021-01-15 NOTE — Patient Instructions (Signed)
Medication Instructions:  Continue all current medications.  Labwork: none  Testing/Procedures: none  Follow-Up: 1 year   Any Other Special Instructions Will Be Listed Below (If Applicable).  If you need a refill on your cardiac medications before your next appointment, please call your pharmacy.  

## 2021-02-14 ENCOUNTER — Other Ambulatory Visit: Payer: Self-pay | Admitting: Family Medicine

## 2021-03-19 ENCOUNTER — Telehealth: Payer: Medicare HMO

## 2021-03-24 ENCOUNTER — Ambulatory Visit (INDEPENDENT_AMBULATORY_CARE_PROVIDER_SITE_OTHER): Payer: Medicare HMO

## 2021-03-24 DIAGNOSIS — I441 Atrioventricular block, second degree: Secondary | ICD-10-CM

## 2021-03-25 LAB — CUP PACEART REMOTE DEVICE CHECK
Date Time Interrogation Session: 20221012082646
Implantable Lead Implant Date: 20180709
Implantable Lead Implant Date: 20180709
Implantable Lead Location: 753859
Implantable Lead Location: 753860
Implantable Lead Model: 5076
Implantable Lead Model: 5076
Implantable Pulse Generator Implant Date: 20180709
Pulse Gen Model: 407145
Pulse Gen Serial Number: 69106657

## 2021-03-26 ENCOUNTER — Other Ambulatory Visit: Payer: Self-pay

## 2021-03-26 MED ORDER — ISOSORBIDE MONONITRATE ER 30 MG PO TB24: 30.0000 mg | ORAL_TABLET | Freq: Every day | ORAL | 0 refills | Status: DC

## 2021-03-26 NOTE — Telephone Encounter (Signed)
This is a [facility] pt °

## 2021-04-02 NOTE — Progress Notes (Signed)
Remote pacemaker transmission.   

## 2021-04-05 ENCOUNTER — Other Ambulatory Visit: Payer: Self-pay | Admitting: Family Medicine

## 2021-04-15 ENCOUNTER — Other Ambulatory Visit: Payer: Self-pay | Admitting: Cardiology

## 2021-04-28 DIAGNOSIS — E1165 Type 2 diabetes mellitus with hyperglycemia: Secondary | ICD-10-CM | POA: Diagnosis not present

## 2021-04-28 DIAGNOSIS — Z299 Encounter for prophylactic measures, unspecified: Secondary | ICD-10-CM | POA: Diagnosis not present

## 2021-04-28 DIAGNOSIS — G43909 Migraine, unspecified, not intractable, without status migrainosus: Secondary | ICD-10-CM | POA: Diagnosis not present

## 2021-04-28 DIAGNOSIS — I10 Essential (primary) hypertension: Secondary | ICD-10-CM | POA: Diagnosis not present

## 2021-05-18 DIAGNOSIS — Z0000 Encounter for general adult medical examination without abnormal findings: Secondary | ICD-10-CM | POA: Diagnosis not present

## 2021-05-18 DIAGNOSIS — Z1331 Encounter for screening for depression: Secondary | ICD-10-CM | POA: Diagnosis not present

## 2021-05-18 DIAGNOSIS — Z6834 Body mass index (BMI) 34.0-34.9, adult: Secondary | ICD-10-CM | POA: Diagnosis not present

## 2021-05-18 DIAGNOSIS — Z79899 Other long term (current) drug therapy: Secondary | ICD-10-CM | POA: Diagnosis not present

## 2021-05-18 DIAGNOSIS — E1165 Type 2 diabetes mellitus with hyperglycemia: Secondary | ICD-10-CM | POA: Diagnosis not present

## 2021-05-18 DIAGNOSIS — I10 Essential (primary) hypertension: Secondary | ICD-10-CM | POA: Diagnosis not present

## 2021-05-18 DIAGNOSIS — Z7189 Other specified counseling: Secondary | ICD-10-CM | POA: Diagnosis not present

## 2021-05-18 DIAGNOSIS — Z299 Encounter for prophylactic measures, unspecified: Secondary | ICD-10-CM | POA: Diagnosis not present

## 2021-05-18 DIAGNOSIS — E669 Obesity, unspecified: Secondary | ICD-10-CM | POA: Diagnosis not present

## 2021-05-18 DIAGNOSIS — Z1339 Encounter for screening examination for other mental health and behavioral disorders: Secondary | ICD-10-CM | POA: Diagnosis not present

## 2021-06-05 ENCOUNTER — Other Ambulatory Visit: Payer: Self-pay | Admitting: Cardiology

## 2021-06-09 NOTE — Progress Notes (Deleted)
CLINIC:  Survivorship   REASON FOR VISIT:  Routine follow-up for history of breast cancer.   BRIEF ONCOLOGIC HISTORY:  Oncology History  Malignant neoplasm of upper-outer quadrant of left female breast ([place])  11/30/2012 Initial Diagnosis   Malignant neoplasm of upper-outer quadrant of left female breast: Left breast bloody nipple discharge that led to mammogram. Initial biopsy revealed DCIS ER/PR positive   12/24/2012 Surgery   Left mastectomy with sentinel lymph node biopsy. Invasive ductal carcinoma grade 2 with 2 foci each measuring less than 0.1 cm with high-grade DCIS 2.6 cm and 1.4 cm 6 sentinel nodes negative ER 100% PR 100% HER-2 negative Ki-67 14%   01/31/2013 - 03/27/2018 Anti-estrogen oral therapy   Aromasin 25 mg once daily      INTERVAL HISTORY:  Kristen [name] presents to the [facility] today for routine follow-up for her history of breast cancer.  Overall, she reports feeling quite well.   Her most recent mammogram was completed on her right breast on December 11, 2020.  It showed no evidence of malignancy in breast density category A.    REVIEW OF SYSTEMS:  Review of Systems  Constitutional:  Negative for appetite change, chills, fatigue, fever and unexpected weight change.  HENT:   Negative for hearing loss, lump/mass and trouble swallowing.   Eyes:  Negative for eye problems and icterus.  Respiratory:  Negative for chest tightness, cough and shortness of breath.   Cardiovascular:  Negative for chest pain, leg swelling and palpitations.  Gastrointestinal:  Negative for abdominal distention, abdominal pain, constipation, diarrhea, nausea and vomiting.  Endocrine: Negative for hot flashes.  Genitourinary:  Negative for difficulty urinating.   Musculoskeletal:  Negative for arthralgias.  Skin:  Negative for itching and rash.  Neurological:  Negative for dizziness, extremity weakness, headaches and numbness.  Hematological:  Negative for adenopathy. Does not  bruise/bleed easily.  Psychiatric/Behavioral:  Negative for depression. The patient is not nervous/anxious.  Breast: Denies any new nodularity, masses, tenderness, nipple changes, or nipple discharge.       PAST MEDICAL/SURGICAL HISTORY:  Past Medical History:  Diagnosis Date   Anemia    Anxiety    Arthritis    Bifascicular block    Blood transfusion without reported diagnosis    CAD (coronary artery disease)    DES to LAD 02/2019   Depression    Diabetic neuropathy (HCC)    Essential hypertension    GERD (gastroesophageal reflux disease)    Hemorrhoids    History of stroke    a. MRI 12/2016 - remote left cerebellar infarcts incidentally noted.   Hyperlipidemia    Malignant neoplasm of upper-outer quadrant of left female breast ([place]) 11/30/2012   Nephrolithiasis    Obesity    Overactive bladder    Personal history of colonic polyps    09/2010 - 2 diminutive adenomas 02/12/2016 5 mm descending polyp - prolapse type polyp (not precancerous) no recall needed given hx, findings and age    S/P placement of cardiac pacemaker 01/03/2017   Type 2 diabetes mellitus ([place])    Vitamin D deficiency    Past Surgical History:  Procedure Laterality Date   APPENDECTOMY     BACK SURGERY     BREAST SURGERY     CARDIAC CATHETERIZATION  01/2009   cath by Dr Lia Foyer revealed nonobstructive CAD   [place], LAPAROSCOPIC     COLONOSCOPY     last 2012- with polyps  CORONARY STENT INTERVENTION N/A 02/26/2019   Procedure: CORONARY STENT INTERVENTION;  Surgeon: Jettie Booze, MD;  Location: [facility];  Service: Cardiovascular;  Laterality: N/A;   INTRAVASCULAR PRESSURE WIRE/FFR STUDY N/A 02/26/2019   Procedure: INTRAVASCULAR PRESSURE WIRE/FFR STUDY;  Surgeon: Jettie Booze, MD;  Location: [facility];  Service: Cardiovascular;  Laterality: N/A;   JOINT REPLACEMENT     LEFT HEART CATH AND CORONARY ANGIOGRAPHY N/A 02/26/2019    Procedure: LEFT HEART CATH AND CORONARY ANGIOGRAPHY;  Surgeon: Jettie Booze, MD;  Location: [facility];  Service: Cardiovascular;  Laterality: N/A;   LITHOTRIPSY     MASS EXCISION Left 08/28/2020   Procedure: EXCISION SKIN CANCER LEFT ARM;  Surgeon: Aviva Signs, MD;  Location: [facility];  Service: General;  Laterality: Left;   MASTECTOMY Left 12/24/2012   MASTECTOMY W/ SENTINEL NODE BIOPSY Left 12/24/2012   Procedure: LEFT MASTECTOMY WITH LEFT SENTINEL LYMPH NODE BIOPSY;  Surgeon: Rolm Bookbinder, MD;  Location: [facility];  Service: General;  Laterality: Left;   OOPHORECTOMY Right    1.5  ovaries rem   PACEMAKER IMPLANT N/A 12/19/2016   Procedure: Pacemaker Implant;  Surgeon: Deboraha Sprang, MD;  Location: [facility];  Service: Cardiovascular;  Laterality: N/A;   PARTIAL HIP ARTHROPLASTY     left hip   POLYPECTOMY     REPLACEMENT TOTAL KNEE     both knees   TONSILLECTOMY     TOTAL MASTECTOMY Left 12/24/2012   Dr Donne Hazel     ALLERGIES:  Allergies  Allergen Reactions   Sulfonamide Derivatives Hives     CURRENT MEDICATIONS:  Outpatient Encounter Medications as of 06/10/2021  Medication Sig   ACCU-CHEK AVIVA PLUS test strip USE AS DIRECTED TO MONITOR FINGERSTICK BLOOD SUGAR THREE TIMES DAILY   Accu-Chek Softclix Lancets lancets TEST BLOOD SUGAR THREE TIMES DAILY (NEED MD APPOINTMENT)   acetaminophen (TYLENOL) 325 MG tablet Take 650 mg by mouth every 6 (six) hours as needed for moderate pain.   amLODipine (NORVASC) 5 MG tablet TAKE 1 TABLET EVERY DAY   aspirin EC 81 MG tablet Take 81 mg by mouth daily.   atorvastatin (LIPITOR) 40 MG tablet TAKE 1 TABLET AT BEDTIME   cholecalciferol (VITAMIN D3) 25 MCG (1000 UNIT) tablet Take 1 tablet (1,000 Units total) by mouth daily.   clopidogrel (PLAVIX) 75 MG tablet TAKE 1 TABLET DAILY WITH BREAKFAST   ezetimibe (ZETIA) 10 MG tablet Take 1 tablet (10 mg total) by mouth daily.   FLUoxetine (PROZAC) 40 MG capsule Take  1 capsule (40 mg total) by mouth daily.   fluticasone (FLONASE) 50 MCG/ACT nasal spray USE 2 SPRAYS IN EACH NOSTRIL DAILY AS NEEDED FOR ALLERGIES(NO FURTHER REFILLS TO BE GIVEN.NEED TO ESTABLISH NEW PCP)   gabapentin (NEURONTIN) 300 MG capsule TAKE ONE CAPSULE BY MOUTH 2 TIMES A DAY   insulin aspart (NOVOLOG FLEXPEN) 100 UNIT/ML FlexPen Inject 12 units SQ 3x daily with meals   Insulin Pen Needle (UNIFINE PENTIPS) 32G X 4 MM MISC USE TO INJECT TRESIBA AND NOVOLOG EVERY DAY AS DIRECTED   isosorbide mononitrate (IMDUR) 30 MG 24 hr tablet TAKE 1 TABLET EVERY DAY   lisinopril (ZESTRIL) 2.5 MG tablet TAKE 1 TABLET EVERY DAY   metFORMIN (GLUCOPHAGE) 1000 MG tablet TAKE 1 TABLET TWICE DAILY WITH MEALS   nitroGLYCERIN (NITROSTAT) 0.4 MG SL tablet Place 1 tablet (0.4 mg total) under the tongue every 5 (five) minutes as needed.   oxybutynin (DITROPAN-XL)  5 MG 24 hr tablet TAKE 1 TABLET AT BEDTIME   pantoprazole (PROTONIX) 40 MG tablet TAKE 1 TABLET (40 MG TOTAL) BY MOUTH DAILY AT 6 AM.   traMADol (ULTRAM) 50 MG tablet Take 1 tablet (50 mg total) by mouth every 6 (six) hours as needed.   TRESIBA FLEXTOUCH 100 UNIT/ML FlexTouch Pen INJECT 62 UNITS INTO THE SKIN AT BEDTIME   No facility-administered encounter medications on file as of 06/10/2021.     ONCOLOGIC FAMILY HISTORY:  Family History  Problem Relation Age of Onset   Cancer Mother        mouth cancer   Heart attack Father 32   Breast cancer Sister    Lung cancer Brother 69   Lung cancer Brother    Lung cancer Brother    Brain cancer Brother    Colon cancer Neg Hx    Colon polyps Neg Hx    Rectal cancer Neg Hx    Stomach cancer Neg Hx    Esophageal cancer Neg Hx     GENETIC COUNSELING/TESTING: Not at this time  SOCIAL HISTORY:  Social History   Socioeconomic History   Marital status: Widowed    Spouse name: Not on file   Number of children: 2   Years of education: Not on file   Highest education level: Not on file   Occupational History   Not on file  Tobacco Use   Smoking status: Never   Smokeless tobacco: Never  Vaping Use   Vaping Use: Never used  Substance and Sexual Activity   Alcohol use: No   Drug use: No   Sexual activity: Not on file  Other Topics Concern   Not on file  Social History Narrative   Lives in [place] with daughter.   Retired   Investment banker, operational of Radio broadcast assistant Strain: Not on [employer] Insecurity: Not on file  Transportation Needs: Not on file  Physical Activity: Not on file  Stress: Not on file  Social Connections: Not on file  Intimate Partner Violence: Not on file      PHYSICAL EXAMINATION:  Vital Signs: There were no vitals filed for this visit.  There were no vitals filed for this visit.  General: Well-nourished, well-appearing female in no acute distress.  Unaccompanied today.   HEENT: Head is normocephalic.  Pupils equal and reactive to light. Conjunctivae clear without exudate.  Sclerae anicteric. Oral mucosa is pink, moist.  Oropharynx is pink without lesions or erythema.  Lymph: No cervical, supraclavicular, or infraclavicular lymphadenopathy noted on palpation.  Cardiovascular: Regular rate and rhythm.Marland Kitchen Respiratory: Clear to auscultation bilaterally. Chest expansion symmetric; breathing non-labored.  Breast Exam:  -Left breast: s/p mastectomy, no sign of local recurrence -Right breast: No appreciable masses on palpation. No skin redness, thickening, or peau d'orange appearance; no nipple retraction or nipple discharge. -Axilla: No axillary adenopathy bilaterally.  GI: Abdomen soft and round; non-tender, non-distended. Bowel sounds normoactive. No hepatosplenomegaly.   GU: Deferred.  Neuro: No focal deficits. Steady gait.  Psych: Mood and affect normal and appropriate for situation.  MSK: No focal spinal tenderness to palpation, full range of motion in bilateral upper extremities Extremities: No edema. Skin: Warm and  dry.  LABORATORY DATA:  None for this visit   DIAGNOSTIC IMAGING:  Most recent mammogram:  CLINICAL DATA:  Screening. Personal history of malignant LEFT mastectomy in 2014.   EXAM: DIGITAL SCREENING UNILATERAL RIGHT MAMMOGRAM WITH CAD AND TOMOSYNTHESIS   TECHNIQUE: Right screening digital  craniocaudal and mediolateral oblique mammograms were obtained. Right screening digital breast tomosynthesis was performed. The images were evaluated with computer-aided detection.   COMPARISON:  Previous exam(s).   ACR Breast Density Category a: The breast tissue is almost entirely fatty.   FINDINGS: The patient has had a left mastectomy. There are no findings suspicious for malignancy in the RIGHT breast.   IMPRESSION: No mammographic evidence of malignancy. A result letter of this screening mammogram will be mailed directly to the patient.   RECOMMENDATION: Screening mammogram in one year.  (Code:SM-R-74M)   BI-RADS CATEGORY  1: Negative.     Electronically Signed   By: Evangeline Dakin M.D.   On: 12/16/2020 14:10   ASSESSMENT AND PLAN:  Ms.. Bossi is a pleasant 75 y.o. female with history of Stage IA left breast invasive ductal carcinoma, ER+/PR+/HER2-, diagnosed in 2014, treated with mastectomy, and anti-estrogen therapy with Aromasin x 5 years completing therapy in 03/2018.  [facility] for [facility] and routine follow-up.   1. History of breast cancer:  Ms. Kimmons is currently clinically and radiographically without evidence of disease or recurrence of breast cancer. Esabella is due for repeat mammogram in 12/2021; orders placed today. She will return in 1 year for follow up.  I encouraged her to call me with any questions or concerns before her next visit at the cancer center, and I would be happy to see her sooner, if needed.    2. Bone health:  Given Ms. Winebarger's age, history of breast cancer, and her previous anti-estrogen therapy with  Aromasin, she is at risk for bone demineralization.  Her most recent bone density test in March 2021 was normal.  She was given education on specific food and activities to promote bone health.  3. Cancer screening:  Due to Ms. Tangredi's history and her age, she should receive screening for skin cancers. She was encouraged to follow-up with her PCP for appropriate cancer screenings.   4. Health maintenance and wellness promotion: Ms. Skates was encouraged to consume 5-7 servings of fruits and vegetables per day. She was also encouraged to engage in moderate exercise for 30 minutes per day as she is able most days of the week. She was instructed to limit her alcohol consumption and continue to abstain from tobacco use.    Dispo:  -Return to cancer center in one year for LTS follow up -Mammogram in 12/2021  Total encounter time: 20 minutes*in face-to-face visit time, chart review, lab review, care coordination, and documentation of the encounter.  Wilber Bihari, NP 06/09/21 2:51 PM Medical Oncology and Hematology [facility] [place], [place] [address] Tel. [phone]    Fax. [phone]  *Total Encounter Time as defined by the Centers for Medicare and Medicaid Services includes, in addition to the face-to-face time of a patient visit (documented in the note above) non-face-to-face time: obtaining and reviewing outside history, ordering and reviewing medications, tests or procedures, care coordination (communications with other health care professionals or caregivers) and documentation in the medical record.   Note: PRIMARY CARE PROVIDER Glenda Chroman, MD [place] [phone]

## 2021-06-10 ENCOUNTER — Inpatient Hospital Stay: Payer: Medicare HMO | Attending: Adult Health | Admitting: Adult Health

## 2021-06-22 LAB — CUP PACEART REMOTE DEVICE CHECK
Date Time Interrogation Session: 20230110093606
Implantable Lead Implant Date: 20180709
Implantable Lead Implant Date: 20180709
Implantable Lead Location: 753859
Implantable Lead Location: 753860
Implantable Lead Model: 5076
Implantable Lead Model: 5076
Implantable Pulse Generator Implant Date: 20180709
Pulse Gen Model: 407145
Pulse Gen Serial Number: 69106657

## 2021-06-23 ENCOUNTER — Ambulatory Visit (INDEPENDENT_AMBULATORY_CARE_PROVIDER_SITE_OTHER): Payer: Medicare HMO

## 2021-06-23 DIAGNOSIS — I441 Atrioventricular block, second degree: Secondary | ICD-10-CM

## 2021-07-05 NOTE — Progress Notes (Signed)
Remote pacemaker transmission.   

## 2021-07-29 ENCOUNTER — Ambulatory Visit: Payer: Medicare HMO | Admitting: Cardiology

## 2021-08-02 DIAGNOSIS — E1165 Type 2 diabetes mellitus with hyperglycemia: Secondary | ICD-10-CM | POA: Diagnosis not present

## 2021-08-02 DIAGNOSIS — N390 Urinary tract infection, site not specified: Secondary | ICD-10-CM | POA: Diagnosis not present

## 2021-08-02 DIAGNOSIS — I25119 Atherosclerotic heart disease of native coronary artery with unspecified angina pectoris: Secondary | ICD-10-CM | POA: Diagnosis not present

## 2021-08-02 DIAGNOSIS — I10 Essential (primary) hypertension: Secondary | ICD-10-CM | POA: Diagnosis not present

## 2021-08-02 DIAGNOSIS — Z299 Encounter for prophylactic measures, unspecified: Secondary | ICD-10-CM | POA: Diagnosis not present

## 2021-08-19 ENCOUNTER — Other Ambulatory Visit: Payer: Self-pay | Admitting: Cardiology

## 2021-09-09 ENCOUNTER — Other Ambulatory Visit: Payer: Self-pay | Admitting: Cardiology

## 2021-09-09 DIAGNOSIS — E1165 Type 2 diabetes mellitus with hyperglycemia: Secondary | ICD-10-CM | POA: Diagnosis not present

## 2021-09-22 ENCOUNTER — Ambulatory Visit (INDEPENDENT_AMBULATORY_CARE_PROVIDER_SITE_OTHER): Payer: Medicare HMO

## 2021-09-22 DIAGNOSIS — I441 Atrioventricular block, second degree: Secondary | ICD-10-CM

## 2021-09-22 DIAGNOSIS — R55 Syncope and collapse: Secondary | ICD-10-CM

## 2021-09-22 LAB — CUP PACEART REMOTE DEVICE CHECK
Battery Remaining Percentage: 65 %
Brady Statistic RA Percent Paced: 59 %
Brady Statistic RV Percent Paced: 55 %
Date Time Interrogation Session: 20230411080940
Implantable Lead Implant Date: 20180709
Implantable Lead Implant Date: 20180709
Implantable Lead Location: 753859
Implantable Lead Location: 753860
Implantable Lead Model: 5076
Implantable Lead Model: 5076
Implantable Pulse Generator Implant Date: 20180709
Lead Channel Impedance Value: 351 Ohm
Lead Channel Impedance Value: 546 Ohm
Lead Channel Pacing Threshold Amplitude: 0.5 V
Lead Channel Pacing Threshold Amplitude: 0.6 V
Lead Channel Pacing Threshold Pulse Width: 0.4 ms
Lead Channel Pacing Threshold Pulse Width: 0.4 ms
Lead Channel Sensing Intrinsic Amplitude: 11.7 mV
Lead Channel Sensing Intrinsic Amplitude: 3.5 mV
Lead Channel Setting Pacing Amplitude: 2 V
Lead Channel Setting Pacing Amplitude: 2.4 V
Lead Channel Setting Pacing Pulse Width: 0.4 ms
Pulse Gen Model: 407145
Pulse Gen Serial Number: 69106657

## 2021-10-08 NOTE — Progress Notes (Signed)
Remote pacemaker transmission.   

## 2021-10-10 DIAGNOSIS — E1165 Type 2 diabetes mellitus with hyperglycemia: Secondary | ICD-10-CM | POA: Diagnosis not present

## 2021-10-13 ENCOUNTER — Encounter: Payer: Self-pay | Admitting: *Deleted

## 2021-10-13 ENCOUNTER — Ambulatory Visit (INDEPENDENT_AMBULATORY_CARE_PROVIDER_SITE_OTHER): Payer: Medicare HMO | Admitting: Cardiology

## 2021-10-13 ENCOUNTER — Encounter: Payer: Self-pay | Admitting: Cardiology

## 2021-10-13 VITALS — BP 122/74 | HR 76 | Ht 62.0 in | Wt 211.2 lb

## 2021-10-13 DIAGNOSIS — R0602 Shortness of breath: Secondary | ICD-10-CM | POA: Diagnosis not present

## 2021-10-13 DIAGNOSIS — E782 Mixed hyperlipidemia: Secondary | ICD-10-CM | POA: Diagnosis not present

## 2021-10-13 DIAGNOSIS — I25119 Atherosclerotic heart disease of native coronary artery with unspecified angina pectoris: Secondary | ICD-10-CM | POA: Diagnosis not present

## 2021-10-13 MED ORDER — NITROGLYCERIN 0.4 MG SL SUBL
0.4000 mg | SUBLINGUAL_TABLET | SUBLINGUAL | 2 refills | Status: DC | PRN
Start: 2021-10-13 — End: 2023-05-09

## 2021-10-13 NOTE — Addendum Note (Signed)
Addended by: Merlene Laughter on: 10/13/2021 05:02 PM ? ? Modules accepted: Orders ? ?

## 2021-10-13 NOTE — Patient Instructions (Addendum)

## 2021-10-13 NOTE — Progress Notes (Signed)
? ? ?Cardiology Office Note ? ?Date: 10/13/2021  ? ?IDNeeta [name], DOB [birth year], MRN [id number] ? ?PCP:  Glenda Chroman, MD  ?Cardiologist:  Rozann Lesches, MD ?Electrophysiologist:  Thompson Grayer, MD  ? ?Chief Complaint  ?Patient presents with  ? Cardiac follow-up  ? ? ?History of Present Illness: ?Kristen [name] is a 76 y.o. female last assessed via telehealth encounter in April 2021.  She is here for a follow-up visit.  States that she lives in her own home here in [place], takes care of basic ADLs.  She reports intermittent, sharp and shooting chest discomfort, has not had to take any nitroglycerin.  Reports shortness of breath with activity however more noticeable in the last several months. ? ?Biotronik pacemaker in place with follow-up by Dr. Rayann Heman.  I reviewed his note from August 2022.  Recent device check in April revealed normal function. ? ?I personally reviewed her ECG today which shows an atrial paced rhythm with left anterior fascicular block and right bundle branch block.  We are requesting her most recent lab work from PCP.  She reports compliance with medications as noted below. ? ?Past Medical History:  ?Diagnosis Date  ? Anemia   ? Anxiety   ? Arthritis   ? Bifascicular block   ? Blood transfusion without reported diagnosis   ? CAD (coronary artery disease)   ? DES to LAD 02/2019  ? Depression   ? Diabetic neuropathy ([facility])   ? Essential hypertension   ? GERD (gastroesophageal reflux disease)   ? Hemorrhoids   ? History of stroke   ? a. MRI 12/2016 - remote left cerebellar infarcts incidentally noted.  ? Hyperlipidemia   ? Malignant neoplasm of upper-outer quadrant of left female breast ([facility]) 11/30/2012  ? Nephrolithiasis   ? Obesity   ? Overactive bladder   ? Personal history of colonic polyps   ? 09/2010 - 2 diminutive adenomas 02/12/2016 5 mm descending polyp - prolapse type polyp (not precancerous) no recall needed given hx, findings and age   ? S/P placement of cardiac pacemaker 01/03/2017  ? Type 2  diabetes mellitus ([facility])   ? Vitamin D deficiency   ? ? ?Past Surgical History:  ?Procedure Laterality Date  ? APPENDECTOMY    ? BACK SURGERY    ? BREAST SURGERY    ? CARDIAC CATHETERIZATION  01/2009  ? cath by Dr Lia Foyer revealed nonobstructive CAD  ? CESAREAN SECTION    ? CHOLECYSTECTOMY    ? CHOLECYSTECTOMY, LAPAROSCOPIC    ? COLONOSCOPY    ? last 2012- with polyps  ? CORONARY STENT INTERVENTION N/A 02/26/2019  ? Procedure: CORONARY STENT INTERVENTION;  Surgeon: Jettie Booze, MD;  Location: [facility];  Service: Cardiovascular;  Laterality: N/A;  ? INTRAVASCULAR PRESSURE WIRE/FFR STUDY N/A 02/26/2019  ? Procedure: INTRAVASCULAR PRESSURE WIRE/FFR STUDY;  Surgeon: Jettie Booze, MD;  Location: [facility];  Service: Cardiovascular;  Laterality: N/A;  ? JOINT REPLACEMENT    ? LEFT HEART CATH AND CORONARY ANGIOGRAPHY N/A 02/26/2019  ? Procedure: LEFT HEART CATH AND CORONARY ANGIOGRAPHY;  Surgeon: Jettie Booze, MD;  Location: [facility];  Service: Cardiovascular;  Laterality: N/A;  ? LITHOTRIPSY    ? MASS EXCISION Left 08/28/2020  ? Procedure: EXCISION SKIN CANCER LEFT ARM;  Surgeon: Aviva Signs, MD;  Location: [facility];  Service: General;  Laterality: Left;  ? MASTECTOMY Left 12/24/2012  ? MASTECTOMY W/ SENTINEL NODE BIOPSY Left 12/24/2012  ? Procedure:  LEFT MASTECTOMY WITH LEFT SENTINEL LYMPH NODE BIOPSY;  Surgeon: Rolm Bookbinder, MD;  Location: [facility];  Service: General;  Laterality: Left;  ? OOPHORECTOMY Right   ? 1.5  ovaries rem  ? PACEMAKER IMPLANT N/A 12/19/2016  ? Procedure: Pacemaker Implant;  Surgeon: Deboraha Sprang, MD;  Location: [facility];  Service: Cardiovascular;  Laterality: N/A;  ? PARTIAL HIP ARTHROPLASTY    ? left hip  ? POLYPECTOMY    ? REPLACEMENT TOTAL KNEE    ? both knees  ? TONSILLECTOMY    ? TOTAL MASTECTOMY Left 12/24/2012  ? Dr Donne Hazel  ? ? ?Current Outpatient Medications  ?Medication Sig Dispense Refill  ? ACCU-CHEK AVIVA PLUS test  strip USE AS DIRECTED TO MONITOR FINGERSTICK BLOOD SUGAR THREE TIMES DAILY 300 strip 11  ? Accu-Chek Softclix Lancets lancets TEST BLOOD SUGAR THREE TIMES DAILY (NEED MD APPOINTMENT) 200 each 0  ? acetaminophen (TYLENOL) 325 MG tablet Take 650 mg by mouth every 6 (six) hours as needed for moderate pain.    ? amLODipine (NORVASC) 5 MG tablet TAKE 1 TABLET EVERY DAY 90 tablet 3  ? aspirin EC 81 MG tablet Take 81 mg by mouth daily.    ? atorvastatin (LIPITOR) 40 MG tablet TAKE 1 TABLET AT BEDTIME 90 tablet 3  ? cholecalciferol (VITAMIN D3) 25 MCG (1000 UNIT) tablet Take 1 tablet (1,000 Units total) by mouth daily.    ? clopidogrel (PLAVIX) 75 MG tablet TAKE 1 TABLET EVERY DAY WITH BREAKFAST 90 tablet 0  ? ezetimibe (ZETIA) 10 MG tablet Take 1 tablet (10 mg total) by mouth daily. 30 tablet 0  ? FLUoxetine (PROZAC) 40 MG capsule Take 1 capsule (40 mg total) by mouth daily. 90 capsule 2  ? fluticasone (FLONASE) 50 MCG/ACT nasal spray USE 2 SPRAYS IN EACH NOSTRIL DAILY AS NEEDED FOR ALLERGIES(NO FURTHER REFILLS TO BE GIVEN.NEED TO ESTABLISH NEW PCP) 48 g 0  ? gabapentin (NEURONTIN) 300 MG capsule TAKE ONE CAPSULE BY MOUTH 2 TIMES A DAY 180 capsule 3  ? insulin aspart (NOVOLOG FLEXPEN) 100 UNIT/ML FlexPen Inject 12 units SQ 3x daily with meals 90 mL 3  ? Insulin Pen Needle (UNIFINE PENTIPS) 32G X 4 MM MISC USE TO INJECT TRESIBA AND NOVOLOG EVERY DAY AS DIRECTED 100 each 0  ? isosorbide mononitrate (IMDUR) 30 MG 24 hr tablet TAKE 1 TABLET EVERY DAY 90 tablet 3  ? lisinopril (ZESTRIL) 2.5 MG tablet TAKE 1 TABLET EVERY DAY 90 tablet 0  ? metFORMIN (GLUCOPHAGE) 1000 MG tablet TAKE 1 TABLET TWICE DAILY WITH MEALS 180 tablet 3  ? oxybutynin (DITROPAN-XL) 5 MG 24 hr tablet TAKE 1 TABLET AT BEDTIME 90 tablet 3  ? pantoprazole (PROTONIX) 40 MG tablet TAKE 1 TABLET (40 MG TOTAL) BY MOUTH DAILY AT 6 AM. 90 tablet 3  ? traMADol (ULTRAM) 50 MG tablet Take 1 tablet (50 mg total) by mouth every 6 (six) hours as needed. 20 tablet 0  ?  TRESIBA FLEXTOUCH 100 UNIT/ML FlexTouch Pen INJECT 62 UNITS INTO THE SKIN AT BEDTIME 60 mL 3  ? nitroGLYCERIN (NITROSTAT) 0.4 MG SL tablet Place 1 tablet (0.4 mg total) under the tongue every 5 (five) minutes x 3 doses as needed for chest pain (if no relief after 2nd dose, proceed to ED or call 911). 25 tablet 2  ? ?No current facility-administered medications for this visit.  ? ?Allergies:  Sulfonamide derivatives  ? ?ROS: No palpitations.  No orthopnea or PND. ? ?Physical Exam: ?  VS:  BP 122/74   Pulse 76   Ht '5\' 2"'$  (1.575 m)   Wt 211 lb 3.2 oz (95.8 kg)   SpO2 93%   BMI 38.63 kg/m? , BMI Body mass index is 38.63 kg/m?. ? ?Wt Readings from Last 3 Encounters:  ?10/13/21 211 lb 3.2 oz (95.8 kg)  ?01/15/21 206 lb (93.4 kg)  ?09/15/20 204 lb (92.5 kg)  ?  ?General: Patient appears comfortable at rest. ?HEENT: Conjunctiva and lids normal. ?Neck: Supple, no elevated JVP or carotid bruits, no thyromegaly. ?Lungs: Clear to auscultation, nonlabored breathing at rest. ?Cardiac: Regular rate and rhythm, no S3, 1/6 systolic murmur, no pericardial rub. ?Extremities: No pitting edema. ? ?ECG:  An ECG dated 01/15/2021 was personally reviewed today and demonstrated:  Dual chamber pacing. ? ?Recent Labwork: ? ?March 2022: Potassium 4.5, BUN 26, creatinine 0.97, hemoglobin A1c 7.6%, hemoglobin 12.5, platelets 166 ? ?Other Studies Reviewed Today: ? ?Echocardiogram 12/13/2016: ?Study Conclusions ?  ?- Left ventricle: The cavity size was normal. Wall thickness was ?  increased in a pattern of moderate LVH. Systolic function was ?  normal. The estimated ejection fraction was in the range of 55% ?  to 60%. Normal GLPSS at -21%. Wall motion was normal; there were ?  no regional wall motion abnormalities. Doppler parameters are ?  consistent with abnormal left ventricular relaxation (grade 1 ?  diastolic dysfunction). The E/e&' ratio is between 8-15, ?  suggesting indeterminate LV filling pressure. ?- Aortic valve: Trileaflet. Sclerosis  without stenosis. There was ?  no regurgitation. ?- Tricuspid valve: There was mild regurgitation. ?- Pulmonary arteries: PA peak pressure: 34 mm Hg (S). ?- Inferior vena cava: The vessel was normal in size. T

## 2021-11-01 ENCOUNTER — Ambulatory Visit (INDEPENDENT_AMBULATORY_CARE_PROVIDER_SITE_OTHER): Payer: Medicare HMO

## 2021-11-01 DIAGNOSIS — I25119 Atherosclerotic heart disease of native coronary artery with unspecified angina pectoris: Secondary | ICD-10-CM

## 2021-11-01 DIAGNOSIS — R0602 Shortness of breath: Secondary | ICD-10-CM | POA: Diagnosis not present

## 2021-11-01 LAB — ECHOCARDIOGRAM COMPLETE
AV Vena cont: 0.18 cm
Area-P 1/2: 3.99 cm2
Calc EF: 63.1 %
MV M vel: 2.2 m/s
MV Peak grad: 19.4 mmHg
S' Lateral: 2.88 cm
Single Plane A2C EF: 59.5 %
Single Plane A4C EF: 63.8 %

## 2021-11-05 DIAGNOSIS — Z299 Encounter for prophylactic measures, unspecified: Secondary | ICD-10-CM | POA: Diagnosis not present

## 2021-11-05 DIAGNOSIS — Z713 Dietary counseling and surveillance: Secondary | ICD-10-CM | POA: Diagnosis not present

## 2021-11-05 DIAGNOSIS — I10 Essential (primary) hypertension: Secondary | ICD-10-CM | POA: Diagnosis not present

## 2021-11-05 DIAGNOSIS — Z6835 Body mass index (BMI) 35.0-35.9, adult: Secondary | ICD-10-CM | POA: Diagnosis not present

## 2021-11-05 DIAGNOSIS — E1165 Type 2 diabetes mellitus with hyperglycemia: Secondary | ICD-10-CM | POA: Diagnosis not present

## 2021-11-09 DIAGNOSIS — E1165 Type 2 diabetes mellitus with hyperglycemia: Secondary | ICD-10-CM | POA: Diagnosis not present

## 2021-11-10 DIAGNOSIS — E119 Type 2 diabetes mellitus without complications: Secondary | ICD-10-CM | POA: Diagnosis not present

## 2021-11-17 ENCOUNTER — Other Ambulatory Visit: Payer: Self-pay | Admitting: Adult Health

## 2021-11-17 DIAGNOSIS — Z1231 Encounter for screening mammogram for malignant neoplasm of breast: Secondary | ICD-10-CM

## 2021-12-09 DIAGNOSIS — E1165 Type 2 diabetes mellitus with hyperglycemia: Secondary | ICD-10-CM | POA: Diagnosis not present

## 2021-12-13 ENCOUNTER — Ambulatory Visit
Admission: RE | Admit: 2021-12-13 | Discharge: 2021-12-13 | Disposition: A | Discharge status: Home / Self Care | Payer: Medicare HMO | Source: Ambulatory Visit | Attending: Adult Health | Admitting: Adult Health

## 2021-12-13 ENCOUNTER — Other Ambulatory Visit: Payer: Self-pay | Admitting: Adult Health

## 2021-12-13 DIAGNOSIS — Z853 Personal history of malignant neoplasm of breast: Secondary | ICD-10-CM

## 2021-12-13 DIAGNOSIS — Z1231 Encounter for screening mammogram for malignant neoplasm of breast: Secondary | ICD-10-CM

## 2021-12-13 DIAGNOSIS — N644 Mastodynia: Secondary | ICD-10-CM

## 2021-12-13 DIAGNOSIS — N649 Disorder of breast, unspecified: Secondary | ICD-10-CM

## 2021-12-17 ENCOUNTER — Ambulatory Visit
Admission: RE | Admit: 2021-12-17 | Discharge: 2021-12-17 | Disposition: A | Discharge status: Home / Self Care | Payer: Medicare HMO | Source: Ambulatory Visit | Attending: Adult Health | Admitting: Adult Health

## 2021-12-17 DIAGNOSIS — N644 Mastodynia: Secondary | ICD-10-CM | POA: Diagnosis not present

## 2021-12-17 DIAGNOSIS — Z853 Personal history of malignant neoplasm of breast: Secondary | ICD-10-CM

## 2021-12-17 DIAGNOSIS — N649 Disorder of breast, unspecified: Secondary | ICD-10-CM

## 2021-12-17 DIAGNOSIS — R928 Other abnormal and inconclusive findings on diagnostic imaging of breast: Secondary | ICD-10-CM | POA: Diagnosis not present

## 2021-12-22 ENCOUNTER — Ambulatory Visit (INDEPENDENT_AMBULATORY_CARE_PROVIDER_SITE_OTHER): Payer: Medicare HMO

## 2021-12-22 DIAGNOSIS — I441 Atrioventricular block, second degree: Secondary | ICD-10-CM | POA: Diagnosis not present

## 2021-12-22 LAB — CUP PACEART REMOTE DEVICE CHECK
Date Time Interrogation Session: 20230712081345
Implantable Lead Implant Date: 20180709
Implantable Lead Implant Date: 20180709
Implantable Lead Location: 753859
Implantable Lead Location: 753860
Implantable Lead Model: 5076
Implantable Lead Model: 5076
Implantable Pulse Generator Implant Date: 20180709
Pulse Gen Model: 407145
Pulse Gen Serial Number: 69106657

## 2022-01-05 ENCOUNTER — Encounter: Payer: Self-pay | Admitting: *Deleted

## 2022-01-05 ENCOUNTER — Ambulatory Visit: Payer: Self-pay | Admitting: *Deleted

## 2022-01-05 NOTE — Patient Outreach (Addendum)
  Care Coordination   Initial Visit Note   01/05/2022  Name: Kristen [name] MRN: [id number] DOB: 07-Sep-1945  Kristen [name] is a 76 y.o. year old female who sees Vyas, Costella Hatcher, MD for primary care. I spoke with  [facility] and daughter, Lajoyce Lauber by phone today.  What matters to the patients health and wellness today?  No Intervention Indicated.   SDOH assessments and interventions completed:   Yes SDOH Interventions Today    Flowsheet Row Most Recent Value  SDOH Interventions   Food Insecurity Interventions Intervention Not Indicated, Other (Comment)  [Verified by Daughter - [place]]  Financial Strain Interventions Other (Comment), Intervention Not Indicated  [Verified by Daughter - [place] Interventions Intervention Not Indicated, Other (Comment)  [Verified by Daughter - [place]]  Physical Activity Interventions Intervention Not Indicated, Other (Comments)  [Verified by Daughter - [place]]  Stress Interventions Intervention Not Indicated, Other (Comment)  [Verified by Daughter - [place]]  Social Connections Interventions Intervention Not Indicated, Other (Comment)  [Verified by Daughter - [place]]  Transportation Interventions Intervention Not Indicated, Other (Comment)  [Verified by Daughter - [place]]       Care Coordination Interventions Activated:  Yes  Care Coordination Interventions:  Yes, provided.  Follow up plan: No further intervention required.  Encounter Outcome:  Pt. Visit Completed.  Nat Christen, BSW, MSW, LCSW  Licensed Education officer, [facility]  Mailing [place] [address] [place], [place] [address] Physical Address-[address] [place],  [address] Toll Free Main # [phone] Fax # [phone] Cell # [phone] [email or web address]

## 2022-01-05 NOTE — Patient Instructions (Signed)
Visit Information  Thank you for taking time to visit with me today. Please don't hesitate to contact me if I can be of assistance to you.   Please call the care guide team at [phone] if you need to cancel or reschedule your appointment.   If you are experiencing a Mental Health or Behavioral Health Crisis or need someone to talk to, please call the Suicide and Crisis Lifeline: 988 call the [place] National Suicide Prevention Lifeline: 1-800-273-8255 or TTY: 1-800-799-4 TTY (1-800-799-4889) to talk to a trained counselor call 1-800-273-TALK (toll free, 24 hour hotline) go to [facility] [address], [place] ([phone]) call the [place] Crisis Line: 800-939-9988 call 911  Patient verbalizes understanding of instructions and care plan provided today and agrees to view in MyChart. Active MyChart status and patient understanding of how to access instructions and care plan via MyChart confirmed with patient.     No further follow up required.  Reverie Vaquera, BSW, MSW, LCSW  Licensed Clinical Social Worker  [facility]  Mailing Address-[address], [place], [place] [address] Physical Address-[address], [place], [place] [address] Toll Free Main # [phone] Fax # [phone] Cell # [phone] [email or web address]            

## 2022-01-10 DIAGNOSIS — E1165 Type 2 diabetes mellitus with hyperglycemia: Secondary | ICD-10-CM | POA: Diagnosis not present

## 2022-01-11 NOTE — Progress Notes (Signed)
Remote pacemaker transmission.   

## 2022-01-14 ENCOUNTER — Ambulatory Visit (INDEPENDENT_AMBULATORY_CARE_PROVIDER_SITE_OTHER): Payer: Medicare HMO | Admitting: Internal Medicine

## 2022-01-14 ENCOUNTER — Encounter: Payer: Self-pay | Admitting: Internal Medicine

## 2022-01-14 VITALS — BP 110/60 | HR 70 | Ht 62.0 in | Wt 209.8 lb

## 2022-01-14 DIAGNOSIS — I453 Trifascicular block: Secondary | ICD-10-CM | POA: Diagnosis not present

## 2022-01-14 DIAGNOSIS — I441 Atrioventricular block, second degree: Secondary | ICD-10-CM

## 2022-01-14 DIAGNOSIS — I25119 Atherosclerotic heart disease of native coronary artery with unspecified angina pectoris: Secondary | ICD-10-CM

## 2022-01-14 NOTE — Patient Instructions (Signed)
Medication Instructions:  Continue all current medications.  Labwork: none  Testing/Procedures: none  Follow-Up: 1 year   Any Other Special Instructions Will Be Listed Below (If Applicable).  If you need a refill on your cardiac medications before your next appointment, please call your pharmacy.  

## 2022-01-14 NOTE — Progress Notes (Signed)
PCP: Glenda Chroman, MD Primary Cardiologist: Dr Domenic Polite Primary EP:  Dr Kristen [name] is a 76 y.o. female who presents today for routine electrophysiology followup.  Since last being seen in our clinic, the patient reports doing very well.  Today, she denies symptoms of palpitations, chest pain, shortness of breath,  lower extremity edema, or dizziness.  She says that in April she had loc while on the couch.  She does not recall any additional details related to the event.  She saw Dr Domenic Polite in May and had an echo which was normal.  There are no arrhythmias or device related issues to explain this.  The patient is otherwise without complaint today.   Past Medical History:  Diagnosis Date   Anemia    Anxiety    Arthritis    Bifascicular block    Blood transfusion without reported diagnosis    Breast cancer ([facility])    CAD (coronary artery disease)    DES to LAD 02/2019   Depression    Diabetic neuropathy ([facility])    Essential hypertension    GERD (gastroesophageal reflux disease)    Hemorrhoids    History of stroke    a. MRI 12/2016 - remote left cerebellar infarcts incidentally noted.   Hyperlipidemia    Malignant neoplasm of upper-outer quadrant of left female breast ([facility]) 11/30/2012   Nephrolithiasis    Obesity    Overactive bladder    Personal history of colonic polyps    09/2010 - 2 diminutive adenomas 02/12/2016 5 mm descending polyp - prolapse type polyp (not precancerous) no recall needed given hx, findings and age    S/P placement of cardiac pacemaker 01/03/2017   Type 2 diabetes mellitus ([facility])    Vitamin D deficiency    Past Surgical History:  Procedure Laterality Date   APPENDECTOMY     BACK SURGERY     BREAST SURGERY     CARDIAC CATHETERIZATION  01/2009   cath by Dr Lia Foyer revealed nonobstructive CAD   [place], LAPAROSCOPIC     COLONOSCOPY     last 2012- with polyps   CORONARY STENT INTERVENTION N/A  02/26/2019   Procedure: CORONARY STENT INTERVENTION;  Surgeon: Jettie Booze, MD;  Location: [facility];  Service: Cardiovascular;  Laterality: N/A;   INTRAVASCULAR PRESSURE WIRE/FFR STUDY N/A 02/26/2019   Procedure: INTRAVASCULAR PRESSURE WIRE/FFR STUDY;  Surgeon: Jettie Booze, MD;  Location: [facility];  Service: Cardiovascular;  Laterality: N/A;   JOINT REPLACEMENT     LEFT HEART CATH AND CORONARY ANGIOGRAPHY N/A 02/26/2019   Procedure: LEFT HEART CATH AND CORONARY ANGIOGRAPHY;  Surgeon: Jettie Booze, MD;  Location: [facility];  Service: Cardiovascular;  Laterality: N/A;   LITHOTRIPSY     MASS EXCISION Left 08/28/2020   Procedure: EXCISION SKIN CANCER LEFT ARM;  Surgeon: Aviva Signs, MD;  Location: [facility];  Service: General;  Laterality: Left;   MASTECTOMY Left 12/24/2012   MASTECTOMY W/ SENTINEL NODE BIOPSY Left 12/24/2012   Procedure: LEFT MASTECTOMY WITH LEFT SENTINEL LYMPH NODE BIOPSY;  Surgeon: Rolm Bookbinder, MD;  Location: [facility];  Service: General;  Laterality: Left;   OOPHORECTOMY Right    1.5  ovaries rem   PACEMAKER IMPLANT N/A 12/19/2016   Procedure: Pacemaker Implant;  Surgeon: Deboraha Sprang, MD;  Location: [facility];  Service: Cardiovascular;  Laterality: N/A;   PARTIAL HIP  ARTHROPLASTY     left hip   POLYPECTOMY     REPLACEMENT TOTAL KNEE     both knees   TONSILLECTOMY     TOTAL MASTECTOMY Left 12/24/2012   Dr Donne Hazel    ROS- all systems are reviewed and negative except as per HPI above  Current Outpatient Medications  Medication Sig Dispense Refill   ACCU-CHEK AVIVA PLUS test strip USE AS DIRECTED TO MONITOR FINGERSTICK BLOOD SUGAR THREE TIMES DAILY 300 strip 11   Accu-Chek Softclix Lancets lancets TEST BLOOD SUGAR THREE TIMES DAILY (NEED MD APPOINTMENT) 200 each 0   acetaminophen (TYLENOL) 325 MG tablet Take 650 mg by mouth every 6 (six) hours as needed for moderate pain.     amLODipine (NORVASC) 5  MG tablet TAKE 1 TABLET EVERY DAY 90 tablet 3   aspirin EC 81 MG tablet Take 81 mg by mouth daily.     atorvastatin (LIPITOR) 40 MG tablet TAKE 1 TABLET AT BEDTIME 90 tablet 3   cholecalciferol (VITAMIN D3) 25 MCG (1000 UNIT) tablet Take 1 tablet (1,000 Units total) by mouth daily.     clopidogrel (PLAVIX) 75 MG tablet TAKE 1 TABLET EVERY DAY WITH BREAKFAST 90 tablet 0   ezetimibe (ZETIA) 10 MG tablet Take 1 tablet (10 mg total) by mouth daily. 30 tablet 0   FLUoxetine (PROZAC) 40 MG capsule Take 1 capsule (40 mg total) by mouth daily. 90 capsule 2   fluticasone (FLONASE) 50 MCG/ACT nasal spray USE 2 SPRAYS IN EACH NOSTRIL DAILY AS NEEDED FOR ALLERGIES(NO FURTHER REFILLS TO BE GIVEN.NEED TO ESTABLISH NEW PCP) 48 g 0   gabapentin (NEURONTIN) 300 MG capsule TAKE ONE CAPSULE BY MOUTH 2 TIMES A DAY 180 capsule 3   insulin aspart (NOVOLOG FLEXPEN) 100 UNIT/ML FlexPen Inject 12 units SQ 3x daily with meals 90 mL 3   Insulin Pen Needle (UNIFINE PENTIPS) 32G X 4 MM MISC USE TO INJECT TRESIBA AND NOVOLOG EVERY DAY AS DIRECTED 100 each 0   isosorbide mononitrate (IMDUR) 30 MG 24 hr tablet TAKE 1 TABLET EVERY DAY 90 tablet 3   JARDIANCE 25 MG TABS tablet Take 25 mg by mouth daily.     lisinopril (ZESTRIL) 2.5 MG tablet TAKE 1 TABLET EVERY DAY 90 tablet 0   metFORMIN (GLUCOPHAGE) 1000 MG tablet TAKE 1 TABLET TWICE DAILY WITH MEALS 180 tablet 3   nitroGLYCERIN (NITROSTAT) 0.4 MG SL tablet Place 1 tablet (0.4 mg total) under the tongue every 5 (five) minutes x 3 doses as needed for chest pain (if no relief after 2nd dose, proceed to ED or call 911). 25 tablet 2   oxybutynin (DITROPAN-XL) 5 MG 24 hr tablet TAKE 1 TABLET AT BEDTIME 90 tablet 3   pantoprazole (PROTONIX) 40 MG tablet TAKE 1 TABLET (40 MG TOTAL) BY MOUTH DAILY AT 6 AM. 90 tablet 3   traMADol (ULTRAM) 50 MG tablet Take 1 tablet (50 mg total) by mouth every 6 (six) hours as needed. 20 tablet 0   TRESIBA FLEXTOUCH 100 UNIT/ML FlexTouch Pen INJECT 62  UNITS INTO THE SKIN AT BEDTIME 60 mL 3   No current facility-administered medications for this visit.    Physical Exam: Vitals:   01/14/22 0913  BP: 110/60  Pulse: 70  SpO2: 93%  Weight: 209 lb 12.8 oz (95.2 kg)  Height: '5\' 2"'$  (1.575 m)    GEN- The patient is well appearing, alert and oriented x 3 today.   Head- normocephalic, atraumatic Eyes-  Sclera clear, conjunctiva  pink Ears- hearing intact Oropharynx- clear Lungs- Clear to ausculation bilaterally, normal work of breathing Chest- pacemaker pocket is well healed Heart- Regular rate and rhythm, no murmurs, rubs or gallops, PMI not laterally displaced GI- soft, NT, ND, + BS Extremities- no clubbing, cyanosis, or edema  Pacemaker interrogation- reviewed in detail today,  See PACEART report   Assessment and Plan:  1. Symptomatic second degree heart block Normal pacemaker function See Pace Art report No changes today she is not device dependant today  2. CAD S/p LAD PCI 2000 Stable angina  3. HTN Stable No change required today  4. Obesity Body mass index is 38.37 kg/m. Lifestyle modification advised   Risks, benefits and potential toxicities for medications prescribed and/or refilled reviewed with patient today.   Return in a year  Thompson Grayer MD, [facility] 01/14/2022 9:39 AM  '

## 2022-01-24 ENCOUNTER — Other Ambulatory Visit: Payer: Self-pay

## 2022-01-24 DIAGNOSIS — E0841 Diabetes mellitus due to underlying condition with diabetic mononeuropathy: Secondary | ICD-10-CM

## 2022-01-24 MED ORDER — AMLODIPINE BESYLATE 5 MG PO TABS
5.0000 mg | ORAL_TABLET | Freq: Every day | ORAL | 1 refills | Status: AC
Start: 2022-01-24 — End: ?

## 2022-01-24 MED ORDER — JARDIANCE 25 MG PO TABS
25.0000 mg | ORAL_TABLET | Freq: Every day | ORAL | 1 refills | Status: AC
Start: 2022-01-24 — End: ?

## 2022-01-24 MED ORDER — EZETIMIBE 10 MG PO TABS: 10.0000 mg | ORAL_TABLET | Freq: Every day | ORAL | 1 refills | Status: AC

## 2022-02-01 NOTE — Progress Notes (Signed)
Remote reviewed. Battery status noted.  Leads function stable

## 2022-02-02 ENCOUNTER — Other Ambulatory Visit: Payer: Self-pay | Admitting: Cardiology

## 2022-02-09 DIAGNOSIS — E1165 Type 2 diabetes mellitus with hyperglycemia: Secondary | ICD-10-CM | POA: Diagnosis not present

## 2022-02-16 DIAGNOSIS — Z23 Encounter for immunization: Secondary | ICD-10-CM | POA: Diagnosis not present

## 2022-02-16 DIAGNOSIS — I10 Essential (primary) hypertension: Secondary | ICD-10-CM | POA: Diagnosis not present

## 2022-02-16 DIAGNOSIS — Z299 Encounter for prophylactic measures, unspecified: Secondary | ICD-10-CM | POA: Diagnosis not present

## 2022-02-16 DIAGNOSIS — E1140 Type 2 diabetes mellitus with diabetic neuropathy, unspecified: Secondary | ICD-10-CM | POA: Diagnosis not present

## 2022-02-16 DIAGNOSIS — E1165 Type 2 diabetes mellitus with hyperglycemia: Secondary | ICD-10-CM | POA: Diagnosis not present

## 2022-03-11 DIAGNOSIS — E1165 Type 2 diabetes mellitus with hyperglycemia: Secondary | ICD-10-CM | POA: Diagnosis not present

## 2022-03-23 ENCOUNTER — Ambulatory Visit (INDEPENDENT_AMBULATORY_CARE_PROVIDER_SITE_OTHER): Payer: Medicare Other

## 2022-03-23 DIAGNOSIS — I441 Atrioventricular block, second degree: Secondary | ICD-10-CM

## 2022-03-23 LAB — CUP PACEART REMOTE DEVICE CHECK
Date Time Interrogation Session: 20231011153600
Implantable Lead Implant Date: 20180709
Implantable Lead Implant Date: 20180709
Implantable Lead Location: 753859
Implantable Lead Location: 753860
Implantable Lead Model: 5076
Implantable Lead Model: 5076
Implantable Pulse Generator Implant Date: 20180709
Pulse Gen Model: 407145
Pulse Gen Serial Number: 69106657

## 2022-03-28 DIAGNOSIS — G8929 Other chronic pain: Secondary | ICD-10-CM | POA: Diagnosis not present

## 2022-03-28 DIAGNOSIS — E1142 Type 2 diabetes mellitus with diabetic polyneuropathy: Secondary | ICD-10-CM | POA: Diagnosis not present

## 2022-03-28 DIAGNOSIS — I10 Essential (primary) hypertension: Secondary | ICD-10-CM | POA: Diagnosis not present

## 2022-03-28 DIAGNOSIS — E1165 Type 2 diabetes mellitus with hyperglycemia: Secondary | ICD-10-CM | POA: Diagnosis not present

## 2022-03-28 DIAGNOSIS — Z299 Encounter for prophylactic measures, unspecified: Secondary | ICD-10-CM | POA: Diagnosis not present

## 2022-04-05 NOTE — Progress Notes (Signed)
Remote pacemaker transmission.   

## 2022-04-11 DIAGNOSIS — E1165 Type 2 diabetes mellitus with hyperglycemia: Secondary | ICD-10-CM | POA: Diagnosis not present

## 2022-04-25 ENCOUNTER — Ambulatory Visit: Payer: Medicare Other | Attending: Cardiology | Admitting: Cardiology

## 2022-04-25 ENCOUNTER — Encounter: Payer: Self-pay | Admitting: Cardiology

## 2022-04-25 VITALS — BP 120/70 | HR 64 | Ht 62.0 in | Wt 209.8 lb

## 2022-04-25 DIAGNOSIS — E782 Mixed hyperlipidemia: Secondary | ICD-10-CM | POA: Diagnosis not present

## 2022-04-25 DIAGNOSIS — I25119 Atherosclerotic heart disease of native coronary artery with unspecified angina pectoris: Secondary | ICD-10-CM | POA: Diagnosis not present

## 2022-04-25 NOTE — Progress Notes (Signed)
Cardiology Office Note  Date: 04/25/2022   ID: Kristen [name], DOB 01-12-1946, MRN [id number]  PCP:  Glenda Chroman, MD  Cardiologist:  Rozann Lesches, MD Electrophysiologist:  Thompson Grayer, MD   Chief Complaint  Patient presents with   Cardiac follow-up    History of Present Illness: Kristen [name] is a 76 y.o. female last seen in May.  She is here for a routine visit.  Reports recent sore feeling in her left chest, took a single sublingual nitroglycerin and went to bed.  Symptoms were gone the next morning.  She does not describe any recurring angina but does have exertional fatigue which is stable.  Biotronik pacemaker in place with previous followed by Dr. Rayann Heman.  Device check in October revealed normal function.  She has had no dizziness or syncope.  I reviewed her medications which are stable from a cardiac perspective and outlined below.  She will have routine lab work with PCP in December.  LDL last year was 77.  Past Medical History:  Diagnosis Date   Anemia    Anxiety    Arthritis    Bifascicular block    Blood transfusion without reported diagnosis    Breast cancer ([place])    CAD (coronary artery disease)    DES to LAD 02/2019   Depression    Diabetic neuropathy (HCC)    Essential hypertension    GERD (gastroesophageal reflux disease)    Hemorrhoids    History of stroke    a. MRI 12/2016 - remote left cerebellar infarcts incidentally noted.   Hyperlipidemia    Malignant neoplasm of upper-outer quadrant of left female breast ([place]) 11/30/2012   Nephrolithiasis    Obesity    Overactive bladder    Personal history of colonic polyps    09/2010 - 2 diminutive adenomas 02/12/2016 5 mm descending polyp - prolapse type polyp (not precancerous) no recall needed given hx, findings and age    S/P placement of cardiac pacemaker 01/03/2017   Type 2 diabetes mellitus ([place])    Vitamin D deficiency     Past Surgical History:  Procedure Laterality Date   APPENDECTOMY      BACK SURGERY     BREAST SURGERY     CARDIAC CATHETERIZATION  01/2009   cath by Dr Lia Foyer revealed nonobstructive CAD   [place], LAPAROSCOPIC     COLONOSCOPY     last 2012- with polyps   CORONARY STENT INTERVENTION N/A 02/26/2019   Procedure: CORONARY STENT INTERVENTION;  Surgeon: Jettie Booze, MD;  Location: [facility];  Service: Cardiovascular;  Laterality: N/A;   INTRAVASCULAR PRESSURE WIRE/FFR STUDY N/A 02/26/2019   Procedure: INTRAVASCULAR PRESSURE WIRE/FFR STUDY;  Surgeon: Jettie Booze, MD;  Location: [facility];  Service: Cardiovascular;  Laterality: N/A;   JOINT REPLACEMENT     LEFT HEART CATH AND CORONARY ANGIOGRAPHY N/A 02/26/2019   Procedure: LEFT HEART CATH AND CORONARY ANGIOGRAPHY;  Surgeon: Jettie Booze, MD;  Location: [facility];  Service: Cardiovascular;  Laterality: N/A;   LITHOTRIPSY     MASS EXCISION Left 08/28/2020   Procedure: EXCISION SKIN CANCER LEFT ARM;  Surgeon: Aviva Signs, MD;  Location: [facility];  Service: General;  Laterality: Left;   MASTECTOMY Left 12/24/2012   MASTECTOMY W/ SENTINEL NODE BIOPSY Left 12/24/2012   Procedure: LEFT MASTECTOMY WITH LEFT SENTINEL LYMPH NODE BIOPSY;  Surgeon: Rolm Bookbinder, MD;  Location: [facility];  Service: General;  Laterality: Left;   OOPHORECTOMY Right    1.5  ovaries rem   PACEMAKER IMPLANT N/A 12/19/2016   Procedure: Pacemaker Implant;  Surgeon: Deboraha Sprang, MD;  Location: [facility];  Service: Cardiovascular;  Laterality: N/A;   PARTIAL HIP ARTHROPLASTY     left hip   POLYPECTOMY     REPLACEMENT TOTAL KNEE     both knees   TONSILLECTOMY     TOTAL MASTECTOMY Left 12/24/2012   Dr Donne Hazel    Current Outpatient Medications  Medication Sig Dispense Refill   ACCU-CHEK AVIVA PLUS test strip USE AS DIRECTED TO MONITOR FINGERSTICK BLOOD SUGAR THREE TIMES DAILY 300 strip 11   Accu-Chek Softclix Lancets lancets  TEST BLOOD SUGAR THREE TIMES DAILY (NEED MD APPOINTMENT) 200 each 0   acetaminophen (TYLENOL) 325 MG tablet Take 650 mg by mouth every 6 (six) hours as needed for moderate pain.     amLODipine (NORVASC) 5 MG tablet Take 1 tablet (5 mg total) by mouth daily. 90 tablet 1   aspirin EC 81 MG tablet Take 81 mg by mouth daily.     atorvastatin (LIPITOR) 40 MG tablet TAKE 1 TABLET AT BEDTIME 90 tablet 3   cholecalciferol (VITAMIN D3) 25 MCG (1000 UNIT) tablet Take 1 tablet (1,000 Units total) by mouth daily.     clopidogrel (PLAVIX) 75 MG tablet TAKE 1 TABLET EVERY DAY WITH BREAKFAST 90 tablet 3   ezetimibe (ZETIA) 10 MG tablet Take 1 tablet (10 mg total) by mouth daily. 90 tablet 1   FLUoxetine (PROZAC) 40 MG capsule Take 1 capsule (40 mg total) by mouth daily. 90 capsule 2   fluticasone (FLONASE) 50 MCG/ACT nasal spray USE 2 SPRAYS IN EACH NOSTRIL DAILY AS NEEDED FOR ALLERGIES(NO FURTHER REFILLS TO BE GIVEN.NEED TO ESTABLISH NEW PCP) 48 g 0   gabapentin (NEURONTIN) 300 MG capsule TAKE ONE CAPSULE BY MOUTH 2 TIMES A DAY 180 capsule 3   insulin aspart (NOVOLOG FLEXPEN) 100 UNIT/ML FlexPen Inject 12 units SQ 3x daily with meals 90 mL 3   Insulin Pen Needle (UNIFINE PENTIPS) 32G X 4 MM MISC USE TO INJECT TRESIBA AND NOVOLOG EVERY DAY AS DIRECTED 100 each 0   isosorbide mononitrate (IMDUR) 30 MG 24 hr tablet TAKE 1 TABLET EVERY DAY 90 tablet 3   JARDIANCE 25 MG TABS tablet Take 1 tablet (25 mg total) by mouth daily. 90 tablet 1   lisinopril (ZESTRIL) 2.5 MG tablet TAKE 1 TABLET EVERY DAY 90 tablet 0   metFORMIN (GLUCOPHAGE) 1000 MG tablet TAKE 1 TABLET TWICE DAILY WITH MEALS 180 tablet 3   nitroGLYCERIN (NITROSTAT) 0.4 MG SL tablet Place 1 tablet (0.4 mg total) under the tongue every 5 (five) minutes x 3 doses as needed for chest pain (if no relief after 2nd dose, proceed to ED or call 911). 25 tablet 2   oxybutynin (DITROPAN-XL) 5 MG 24 hr tablet TAKE 1 TABLET AT BEDTIME 90 tablet 3   pantoprazole  (PROTONIX) 40 MG tablet TAKE 1 TABLET (40 MG TOTAL) BY MOUTH DAILY AT 6 AM. 90 tablet 3   traMADol (ULTRAM) 50 MG tablet Take 1 tablet (50 mg total) by mouth every 6 (six) hours as needed. 20 tablet 0   TRESIBA FLEXTOUCH 100 UNIT/ML FlexTouch Pen INJECT 62 UNITS INTO THE SKIN AT BEDTIME 60 mL 3   No current facility-administered medications for this visit.   Allergies:  Sulfonamide derivatives   ROS: No orthopnea or  PND.  No syncope.  Physical Exam: VS:  BP 120/70   Pulse 64   Ht '5\' 2"'$  (1.575 m)   Wt 209 lb 12.8 oz (95.2 kg)   SpO2 96%   BMI 38.37 kg/m , BMI Body mass index is 38.37 kg/m.  Wt Readings from Last 3 Encounters:  04/25/22 209 lb 12.8 oz (95.2 kg)  01/14/22 209 lb 12.8 oz (95.2 kg)  10/13/21 211 lb 3.2 oz (95.8 kg)    General: Patient appears comfortable at rest. HEENT: Conjunctiva and lids normal. Neck: Supple, no elevated JVP or carotid bruits. Lungs: Clear to auscultation, nonlabored breathing at rest. Cardiac: Regular rate and rhythm, no S3, 1/6 systolic murmur. Extremities: No pitting edema.  ECG:  An ECG dated 11/13/2021 was personally reviewed today and demonstrated:  Atrial paced rhythm with left anterior fascicular block and right bundle branch block.  Recent Labwork:    Component Value Date/Time   CHOL 132 05/15/2020 1539   TRIG 279 (H) 05/15/2020 1539   HDL 31 (L) 05/15/2020 1539   CHOLHDL 4.3 05/15/2020 1539   VLDL 23 10/31/2016 1151   LDLCALC 64 05/15/2020 1539  December 2022: Hemoglobin 12.5, platelets 247, BUN 23, creatinine 1.15, potassium 5.2, AST 14, ALT 20, cholesterol 134, triglycerides 147, HDL 31, LDL 77, TSH 0.67  Other Studies Reviewed Today:  Echocardiogram 11/01/2021:  1. Left ventricular ejection fraction, by estimation, is 55 to 60%. The  left ventricle has normal function. The left ventricle has no regional  wall motion abnormalities. There is mild left ventricular hypertrophy.  Left ventricular diastolic parameters  are  indeterminate.   2. Right ventricular systolic function is normal. The right ventricular  size is normal.   3. The mitral valve is normal in structure. No evidence of mitral valve  regurgitation. No evidence of mitral stenosis.   4. The tricuspid valve is abnormal.   5. The aortic valve is tricuspid. There is mild calcification of the  aortic valve. There is mild thickening of the aortic valve. Aortic valve  regurgitation is trivial. No aortic stenosis is present.   6. Aortic dilatation noted. There is mild dilatation of the ascending  aorta, measuring 38 mm.   Assessment and Plan:  1.  CAD status post DES to the LAD in 2020.  Plan to continue medical therapy and observation for now.  LVEF 55 to 60% by echocardiogram done earlier this year.  Currently on aspirin, Plavix, Norvasc, Lipitor, Zetia, Imdur, Jardiance, lisinopril, and as needed nitroglycerin.  2.  Mixed hyperlipidemia on Lipitor and Zetia.  Last LDL 77 with follow-up lab work in December of this year pending.  3.  Symptomatic second-degree heart block status post Biotronik pacemaker, follow-up now pending with Dr. Myles Gip.  Medication Adjustments/Labs and Tests Ordered: Current medicines are reviewed at length with the patient today.  Concerns regarding medicines are outlined above.   Tests Ordered: No orders of the defined types were placed in this encounter.   Medication Changes: No orders of the defined types were placed in this encounter.   Disposition:  Follow up  6 months.  Signed, Satira Sark, MD, [facility] 04/25/2022 4:16 PM    [facility] [place], [place], [place] [address] Phone: [phone]; Fax: [phone]

## 2022-04-25 NOTE — Patient Instructions (Addendum)

## 2022-05-11 DIAGNOSIS — E1165 Type 2 diabetes mellitus with hyperglycemia: Secondary | ICD-10-CM | POA: Diagnosis not present

## 2022-05-23 DIAGNOSIS — E7800 Pure hypercholesterolemia, unspecified: Secondary | ICD-10-CM | POA: Diagnosis not present

## 2022-05-23 DIAGNOSIS — I10 Essential (primary) hypertension: Secondary | ICD-10-CM | POA: Diagnosis not present

## 2022-05-23 DIAGNOSIS — E1165 Type 2 diabetes mellitus with hyperglycemia: Secondary | ICD-10-CM | POA: Diagnosis not present

## 2022-05-23 DIAGNOSIS — Z299 Encounter for prophylactic measures, unspecified: Secondary | ICD-10-CM | POA: Diagnosis not present

## 2022-05-23 DIAGNOSIS — Z0000 Encounter for general adult medical examination without abnormal findings: Secondary | ICD-10-CM | POA: Diagnosis not present

## 2022-05-23 DIAGNOSIS — G8929 Other chronic pain: Secondary | ICD-10-CM | POA: Diagnosis not present

## 2022-05-23 DIAGNOSIS — Z7189 Other specified counseling: Secondary | ICD-10-CM | POA: Diagnosis not present

## 2022-05-23 DIAGNOSIS — I25119 Atherosclerotic heart disease of native coronary artery with unspecified angina pectoris: Secondary | ICD-10-CM | POA: Diagnosis not present

## 2022-05-23 DIAGNOSIS — R5383 Other fatigue: Secondary | ICD-10-CM | POA: Diagnosis not present

## 2022-05-30 DIAGNOSIS — Z0000 Encounter for general adult medical examination without abnormal findings: Secondary | ICD-10-CM | POA: Diagnosis not present

## 2022-05-30 DIAGNOSIS — I10 Essential (primary) hypertension: Secondary | ICD-10-CM | POA: Diagnosis not present

## 2022-05-30 DIAGNOSIS — E1165 Type 2 diabetes mellitus with hyperglycemia: Secondary | ICD-10-CM | POA: Diagnosis not present

## 2022-05-30 DIAGNOSIS — Z299 Encounter for prophylactic measures, unspecified: Secondary | ICD-10-CM | POA: Diagnosis not present

## 2022-05-30 DIAGNOSIS — Z713 Dietary counseling and surveillance: Secondary | ICD-10-CM | POA: Diagnosis not present

## 2022-06-10 DIAGNOSIS — E1165 Type 2 diabetes mellitus with hyperglycemia: Secondary | ICD-10-CM | POA: Diagnosis not present

## 2022-06-15 DIAGNOSIS — E119 Type 2 diabetes mellitus without complications: Secondary | ICD-10-CM | POA: Diagnosis not present

## 2022-06-15 DIAGNOSIS — Z7984 Long term (current) use of oral hypoglycemic drugs: Secondary | ICD-10-CM | POA: Diagnosis not present

## 2022-06-15 DIAGNOSIS — Z794 Long term (current) use of insulin: Secondary | ICD-10-CM | POA: Diagnosis not present

## 2022-06-15 DIAGNOSIS — H25813 Combined forms of age-related cataract, bilateral: Secondary | ICD-10-CM | POA: Diagnosis not present

## 2022-06-22 ENCOUNTER — Ambulatory Visit (INDEPENDENT_AMBULATORY_CARE_PROVIDER_SITE_OTHER): Payer: Medicare Other

## 2022-06-22 DIAGNOSIS — I441 Atrioventricular block, second degree: Secondary | ICD-10-CM

## 2022-06-22 LAB — CUP PACEART REMOTE DEVICE CHECK
Date Time Interrogation Session: 20240110091956
Implantable Lead Connection Status: 753985
Implantable Lead Connection Status: 753985
Implantable Lead Implant Date: 20180709
Implantable Lead Implant Date: 20180709
Implantable Lead Location: 753859
Implantable Lead Location: 753860
Implantable Lead Model: 5076
Implantable Lead Model: 5076
Implantable Pulse Generator Implant Date: 20180709
Pulse Gen Model: 407145
Pulse Gen Serial Number: 69106657

## 2022-07-11 DIAGNOSIS — E1165 Type 2 diabetes mellitus with hyperglycemia: Secondary | ICD-10-CM | POA: Diagnosis not present

## 2022-07-14 NOTE — Progress Notes (Signed)
Remote pacemaker transmission.   

## 2022-07-18 DIAGNOSIS — H01001 Unspecified blepharitis right upper eyelid: Secondary | ICD-10-CM | POA: Diagnosis not present

## 2022-07-18 DIAGNOSIS — H25813 Combined forms of age-related cataract, bilateral: Secondary | ICD-10-CM | POA: Diagnosis not present

## 2022-07-18 DIAGNOSIS — H02834 Dermatochalasis of left upper eyelid: Secondary | ICD-10-CM | POA: Diagnosis not present

## 2022-07-18 DIAGNOSIS — H02831 Dermatochalasis of right upper eyelid: Secondary | ICD-10-CM | POA: Diagnosis not present

## 2022-08-10 DIAGNOSIS — E1165 Type 2 diabetes mellitus with hyperglycemia: Secondary | ICD-10-CM | POA: Diagnosis not present

## 2022-08-15 ENCOUNTER — Encounter (HOSPITAL_COMMUNITY)
Admission: RE | Admit: 2022-08-15 | Discharge: 2022-08-15 | Disposition: A | Discharge status: Home / Self Care | Payer: Medicare Other | Source: Ambulatory Visit | Attending: Ophthalmology | Admitting: Ophthalmology

## 2022-08-15 ENCOUNTER — Encounter (HOSPITAL_COMMUNITY): Payer: Self-pay

## 2022-08-15 DIAGNOSIS — H25811 Combined forms of age-related cataract, right eye: Secondary | ICD-10-CM | POA: Diagnosis not present

## 2022-08-15 HISTORY — DX: Presence of cardiac pacemaker: Z95.0

## 2022-08-19 NOTE — H&P (Signed)
Surgical History & Physical  Patient Name: Kristen [name] DOB: 24-Dec-1945  Surgery: Cataract extraction with intraocular lens implant phacoemulsification; Right Eye  Surgeon: Baruch Goldmann MD Surgery Date:  08-22-22 Pre-Op Date:  08-15-22  HPI: A 35 Yr. old female patient present for cataract eval per Dr. Hassell Done. 1. 1. The patient complains of sunlight glare causing poor vision, pretty much everything she states she can not see due to vision in her right eye. This started a few months ago. The symptom is constant. The condition's severity is worsening. This is negatively affecting the patient's quality of life and the patient is unable to function adequately in life with the current level of vision. HPI was performed by Baruch Goldmann .  Medical History: Cataracts Arthritis Cancer Diabetes Heart Problem High Blood Pressure LDL acid reflux  Review of Systems Cardiovascular High Blood Pressure Eyes Eye pain, eye strain All recorded systems are negative except as noted above.  Social   Never smoked   Medication Mupirocin, Diclofenac Sodium, Metformin, NovoLog, Isosorbide Mononitrate, Rosuvastatin, Pantoprazole, Jardiance, Atorvastatin, Clopidogrel, Aspirin, Ezetimibe, Gabapentin, Lisinopril, Oxybutynin Chloride, Tresiba,   Sx/Procedures Pacemaker, Mastectomy Lt, Total knee replacement bilateral, Back sx,   Drug Allergies  Sulfa,   History & Physical: Heent: cataract, right eye NECK: supple without bruits LUNGS: lungs clear to auscultation CV: regular rate and rhythm Abdomen: soft and non-tender Impression & Plan: Assessment: 1.  COMBINED FORMS AGE RELATED CATARACT; Both Eyes (H25.813) 2.  DERMATOCHALASIS, no surgery; Right Upper Lid, Left Upper Lid (H02.831, H02.834) 3.  BLEPHARITIS; Right Upper Lid, Right Lower Lid, Left Upper Lid, Left Lower Lid (H01.001, H01.002,H01.004,H01.005) 4.  ARCUS SENILIS; Both Eyes (H18.413) 5.  Pinguecula; Both Eyes (H11.153) 6.   CONJUNCTIVOCHALASIS; Both Eyes (H11.823) 7.  ASTIGMATISM, REGULAR; Both Eyes (H52.223)  Plan: 1.  Cataract accounts for the patient's decreased vision. This visual impairment is not correctable with a tolerable change in glasses or contact lenses. Cataract surgery with an implantation of a new lens should significantly improve the visual and functional status of the patient. Discussed all risks, benefits, alternatives, and potential complications. Discussed the procedures and recovery. Patient desires to have surgery. A-scan ordered and performed today for intra-ocular lens calculations. The surgery will be performed in order to improve vision for driving, reading, and for eye examinations. Recommend phacoemulsification with intra-ocular lens. Recommend Dextenza for post-operative pain and inflammation. Right Eye much worse - first. Dilates well - shugarcaine by protocol. Recommend Toric Lens. - Eyhance  2.  Asymptomatic, recommend observation for now. Findings, prognosis and treatment options reviewed.  3.  Recommend regular lid cleaning.  4.  Discussed significance of finding Answered patient questions about finding  5.  Observe; Artificial tears as needed for irritation.  6.  Discussed condition and symptoms and is considered a type of Dry Eye Disease called "mechanical dry eye" caused by excessive tissue on the eye that is rubbed by the eyelids.  7.  Recommend toric IOL OU.

## 2022-08-22 ENCOUNTER — Encounter (HOSPITAL_COMMUNITY)
Admission: RE | Disposition: A | Discharge status: Home / Self Care | Payer: Self-pay | Source: Home / Self Care | Attending: Ophthalmology

## 2022-08-22 ENCOUNTER — Ambulatory Visit (HOSPITAL_COMMUNITY): Payer: Medicare Other | Admitting: Anesthesiology

## 2022-08-22 ENCOUNTER — Ambulatory Visit (HOSPITAL_BASED_OUTPATIENT_CLINIC_OR_DEPARTMENT_OTHER): Payer: Medicare Other | Admitting: Anesthesiology

## 2022-08-22 ENCOUNTER — Ambulatory Visit (HOSPITAL_COMMUNITY)
Admission: RE | Admit: 2022-08-22 | Discharge: 2022-08-22 | Disposition: A | Discharge status: Home / Self Care | Payer: Medicare Other | Attending: Ophthalmology | Admitting: Ophthalmology

## 2022-08-22 ENCOUNTER — Encounter (HOSPITAL_COMMUNITY): Payer: Self-pay | Admitting: Ophthalmology

## 2022-08-22 DIAGNOSIS — H18413 Arcus senilis, bilateral: Secondary | ICD-10-CM | POA: Insufficient documentation

## 2022-08-22 DIAGNOSIS — E119 Type 2 diabetes mellitus without complications: Secondary | ICD-10-CM | POA: Diagnosis not present

## 2022-08-22 DIAGNOSIS — F32A Depression, unspecified: Secondary | ICD-10-CM | POA: Insufficient documentation

## 2022-08-22 DIAGNOSIS — D649 Anemia, unspecified: Secondary | ICD-10-CM | POA: Diagnosis not present

## 2022-08-22 DIAGNOSIS — F419 Anxiety disorder, unspecified: Secondary | ICD-10-CM | POA: Insufficient documentation

## 2022-08-22 DIAGNOSIS — Z7984 Long term (current) use of oral hypoglycemic drugs: Secondary | ICD-10-CM | POA: Diagnosis not present

## 2022-08-22 DIAGNOSIS — H02831 Dermatochalasis of right upper eyelid: Secondary | ICD-10-CM | POA: Insufficient documentation

## 2022-08-22 DIAGNOSIS — R519 Headache, unspecified: Secondary | ICD-10-CM | POA: Diagnosis not present

## 2022-08-22 DIAGNOSIS — I25119 Atherosclerotic heart disease of native coronary artery with unspecified angina pectoris: Secondary | ICD-10-CM

## 2022-08-22 DIAGNOSIS — H52223 Regular astigmatism, bilateral: Secondary | ICD-10-CM | POA: Insufficient documentation

## 2022-08-22 DIAGNOSIS — I10 Essential (primary) hypertension: Secondary | ICD-10-CM | POA: Insufficient documentation

## 2022-08-22 DIAGNOSIS — H11823 Conjunctivochalasis, bilateral: Secondary | ICD-10-CM | POA: Insufficient documentation

## 2022-08-22 DIAGNOSIS — H0100B Unspecified blepharitis left eye, upper and lower eyelids: Secondary | ICD-10-CM | POA: Insufficient documentation

## 2022-08-22 DIAGNOSIS — K219 Gastro-esophageal reflux disease without esophagitis: Secondary | ICD-10-CM | POA: Insufficient documentation

## 2022-08-22 DIAGNOSIS — H02834 Dermatochalasis of left upper eyelid: Secondary | ICD-10-CM | POA: Insufficient documentation

## 2022-08-22 DIAGNOSIS — H25813 Combined forms of age-related cataract, bilateral: Secondary | ICD-10-CM | POA: Diagnosis not present

## 2022-08-22 DIAGNOSIS — H0100A Unspecified blepharitis right eye, upper and lower eyelids: Secondary | ICD-10-CM | POA: Insufficient documentation

## 2022-08-22 DIAGNOSIS — E1136 Type 2 diabetes mellitus with diabetic cataract: Secondary | ICD-10-CM | POA: Insufficient documentation

## 2022-08-22 DIAGNOSIS — Z794 Long term (current) use of insulin: Secondary | ICD-10-CM

## 2022-08-22 DIAGNOSIS — H25811 Combined forms of age-related cataract, right eye: Secondary | ICD-10-CM

## 2022-08-22 DIAGNOSIS — H11153 Pinguecula, bilateral: Secondary | ICD-10-CM | POA: Diagnosis not present

## 2022-08-22 DIAGNOSIS — H5711 Ocular pain, right eye: Secondary | ICD-10-CM | POA: Diagnosis not present

## 2022-08-22 LAB — GLUCOSE, CAPILLARY: Glucose-Capillary: 210 mg/dL — ABNORMAL HIGH (ref 70–99)

## 2022-08-22 SURGERY — CATARACT EXTRACTION PHACO AND INTRAOCULAR LENS PLACEMENT (IOC) with placement of Corticosteroid
Anesthesia: Monitor Anesthesia Care | Site: Eye | Laterality: Right

## 2022-08-22 MED ORDER — LIDOCAINE HCL (PF) 1 % IJ SOLN
INTRAOCULAR | Status: DC | PRN
  Administered 2022-08-22: 1 mL via OPHTHALMIC

## 2022-08-22 MED ORDER — TETRACAINE HCL 0.5 % OP SOLN
1.0000 [drp] | OPHTHALMIC | Status: AC | PRN
  Administered 2022-08-22 (×3): 1 [drp] via OPHTHALMIC

## 2022-08-22 MED ORDER — TROPICAMIDE 1 % OP SOLN
1.0000 [drp] | OPHTHALMIC | Status: AC | PRN
  Administered 2022-08-22 (×3): 1 [drp] via OPHTHALMIC

## 2022-08-22 MED ORDER — POVIDONE-IODINE 5 % OP SOLN
OPHTHALMIC | Status: DC | PRN
  Administered 2022-08-22: 1 via OPHTHALMIC

## 2022-08-22 MED ORDER — EPINEPHRINE PF 1 MG/ML IJ SOLN
INTRAOCULAR | Status: DC | PRN
  Administered 2022-08-22: 500 mL

## 2022-08-22 MED ORDER — BSS IO SOLN
INTRAOCULAR | Status: DC | PRN
  Administered 2022-08-22: 15 mL via INTRAOCULAR

## 2022-08-22 MED ORDER — MIDAZOLAM HCL 2 MG/2ML IJ SOLN
INTRAMUSCULAR | Status: AC
  Filled 2022-08-22: qty 2

## 2022-08-22 MED ORDER — STERILE WATER FOR IRRIGATION IR SOLN
Status: DC | PRN
  Administered 2022-08-22: 250 mL

## 2022-08-22 MED ORDER — LIDOCAINE HCL 3.5 % OP GEL
1.0000 | Freq: Once | OPHTHALMIC | Status: AC
  Administered 2022-08-22: 1 via OPHTHALMIC

## 2022-08-22 MED ORDER — SODIUM CHLORIDE 0.9% FLUSH
INTRAVENOUS | Status: DC | PRN
  Administered 2022-08-22: 5 mL via INTRAVENOUS

## 2022-08-22 MED ORDER — MIDAZOLAM HCL 5 MG/5ML IJ SOLN
INTRAMUSCULAR | Status: DC | PRN
  Administered 2022-08-22: 1.5 mg via INTRAVENOUS

## 2022-08-22 MED ORDER — SODIUM HYALURONATE 23MG/ML IO SOSY
PREFILLED_SYRINGE | INTRAOCULAR | Status: DC | PRN
  Administered 2022-08-22: .6 mL via INTRAOCULAR

## 2022-08-22 MED ORDER — TRYPAN BLUE 0.06 % IO SOSY
PREFILLED_SYRINGE | INTRAOCULAR | Status: AC
  Filled 2022-08-22: qty 0.5

## 2022-08-22 MED ORDER — DEXAMETHASONE 0.4 MG OP INST
VAGINAL_INSERT | OPHTHALMIC | Status: DC | PRN
  Administered 2022-08-22: .4 mg via OPHTHALMIC

## 2022-08-22 MED ORDER — PHENYLEPHRINE HCL 2.5 % OP SOLN
1.0000 [drp] | OPHTHALMIC | Status: AC | PRN
  Administered 2022-08-22 (×3): 1 [drp] via OPHTHALMIC

## 2022-08-22 MED ORDER — DEXAMETHASONE 0.4 MG OP INST
VAGINAL_INSERT | OPHTHALMIC | Status: AC
  Filled 2022-08-22: qty 1

## 2022-08-22 MED ORDER — SODIUM HYALURONATE 10 MG/ML IO SOLUTION
PREFILLED_SYRINGE | INTRAOCULAR | Status: DC | PRN
  Administered 2022-08-22: .85 mL via INTRAOCULAR

## 2022-08-22 MED ORDER — EPINEPHRINE PF 1 MG/ML IJ SOLN
INTRAMUSCULAR | Status: AC
  Filled 2022-08-22: qty 2

## 2022-08-22 SURGICAL SUPPLY — 14 items
CATARACT SUITE SIGHTPATH (MISCELLANEOUS) ×1 IMPLANT
CLOTH BEACON ORANGE TIMEOUT ST (SAFETY) ×1 IMPLANT
EYE SHIELD UNIVERSAL CLEAR (GAUZE/BANDAGES/DRESSINGS) IMPLANT
FEE CATARACT SUITE SIGHTPATH (MISCELLANEOUS) ×1 IMPLANT
GLOVE BIOGEL PI IND STRL 7.0 (GLOVE) ×2 IMPLANT
LENS IOL RAYNER 21.5 (Intraocular Lens) ×1 IMPLANT
LENS IOL RAYONE EMV 21.5 (Intraocular Lens) IMPLANT
NDL HYPO 18GX1.5 BLUNT FILL (NEEDLE) ×1 IMPLANT
NEEDLE HYPO 18GX1.5 BLUNT FILL (NEEDLE) ×1 IMPLANT
PAD ARMBOARD 7.5X6 YLW CONV (MISCELLANEOUS) ×1 IMPLANT
SYR TB 1ML LL NO SAFETY (SYRINGE) ×1 IMPLANT
TAPE SURG TRANSPORE 1 IN (GAUZE/BANDAGES/DRESSINGS) IMPLANT
TAPE SURGICAL TRANSPORE 1 IN (GAUZE/BANDAGES/DRESSINGS) ×1
WATER STERILE IRR 250ML POUR (IV SOLUTION) ×1 IMPLANT

## 2022-08-22 NOTE — Anesthesia Postprocedure Evaluation (Signed)
Anesthesia Post Note  Patient: Kristen [name]  Procedure(s) Performed: CATARACT EXTRACTION PHACO AND INTRAOCULAR LENS PLACEMENT (I[facility]) with placement of Corticosteroid (Right: Eye)  Patient location during evaluation: Short Stay Anesthesia Type: MAC Level of consciousness: awake and alert Pain management: pain level controlled Vital Signs Assessment: post-procedure vital signs reviewed and stable Respiratory status: spontaneous breathing Cardiovascular status: blood pressure returned to baseline and stable Postop Assessment: no apparent nausea or vomiting Anesthetic complications: no   No notable events documented.   Last Vitals:  Vitals:   08/22/22 0751 08/22/22 0935  BP: 136/67 126/65  Pulse: 66   Resp: 14   Temp: 36.6 C 37.1 C  SpO2: 94% 98%    Last Pain:  Vitals:   08/22/22 0935  TempSrc: Oral  PainSc: 0-No pain                 Jameika Kinn

## 2022-08-22 NOTE — Anesthesia Preprocedure Evaluation (Signed)
Anesthesia Evaluation  Patient identified by MRN, date of birth, ID band Patient awake    Reviewed: Allergy & Precautions, H&P , NPO status , Patient's Chart, lab work & pertinent test results, reviewed documented beta blocker date and time   Airway Mallampati: II  TM Distance: >3 FB Neck ROM: full    Dental no notable dental hx.    Pulmonary neg pulmonary ROS   Pulmonary exam normal breath sounds clear to auscultation       Cardiovascular Exercise Tolerance: Good hypertension, + angina  + CAD  negative cardio ROS + dysrhythmias + pacemaker  Rhythm:regular Rate:Normal     Neuro/Psych  Headaches PSYCHIATRIC DISORDERS Anxiety Depression    negative neurological ROS  negative psych ROS   GI/Hepatic negative GI ROS, Neg liver ROS,GERD  ,,  Endo/Other  negative endocrine ROSdiabetes, Type 2    Renal/GU Renal diseasenegative Renal ROS  negative genitourinary   Musculoskeletal   Abdominal   Peds  Hematology negative hematology ROS (+) Blood dyscrasia, anemia   Anesthesia Other Findings   Reproductive/Obstetrics negative OB ROS                             Anesthesia Physical Anesthesia Plan  ASA: 3  Anesthesia Plan: MAC   Post-op Pain Management:    Induction:   PONV Risk Score and Plan:   Airway Management Planned:   Additional Equipment:   Intra-op Plan:   Post-operative Plan:   Informed Consent: I have reviewed the patients History and Physical, chart, labs and discussed the procedure including the risks, benefits and alternatives for the proposed anesthesia with the patient or authorized representative who has indicated his/her understanding and acceptance.     Dental Advisory Given  Plan Discussed with: CRNA  Anesthesia Plan Comments:        Anesthesia Quick Evaluation

## 2022-08-22 NOTE — Discharge Instructions (Addendum)
Please discharge patient when stable, will follow up today with Dr. Wrzosek at the [facility] office immediately following discharge.  Leave shield in place until visit.  All paperwork with discharge instructions will be given at the office.  [facility] Address:  [address]  [place], [place] [address]  

## 2022-08-22 NOTE — Interval H&P Note (Signed)
History and Physical Interval Note:  08/22/2022 9:03 AM  Kristen [name]  has presented today for surgery, with the diagnosis of combined forms age related cataract; right.  The various methods of treatment have been discussed with the patient and family. After consideration of risks, benefits and other options for treatment, the patient has consented to  Procedure(s) with comments: CATARACT EXTRACTION PHACO AND INTRAOCULAR LENS PLACEMENT ([place]) with placement of Corticosteroid (Right) - CDE as a surgical intervention.  The patient's history has been reviewed, patient examined, no change in status, stable for surgery.  I have reviewed the patient's chart and labs.  Questions were answered to the patient's satisfaction.     Baruch Goldmann

## 2022-08-22 NOTE — Op Note (Signed)
Date of procedure: 08/22/22  Pre-operative diagnosis:  Visually significant combined form age-related cataract, Right Eye (H25.811)  Post-operative diagnosis:   1. Visually significant combined form age-related cataract, Right Eye (H25.811) 2. Pain and inflammation following cataract surgery Right Eye (H57.11)  Procedure:  Removal of cataract via phacoemulsification and insertion of intra-ocular lens Rayner [id number] +21.5D into the capsular bag of the Right Eye 2. Placement of Dextenza insert, Right Eye  Attending surgeon: Gerda Diss. Lenisha Lacap, MD, MA  Anesthesia: MAC, Topical [place]  Complications: None  Estimated Blood Loss: <13m (minimal)  Specimens: None  Implants: As above  Indications:  Visually significant age-related cataract, Right Eye  Procedure:  The patient was seen and identified in the pre-operative area. The operative eye was identified and dilated.  The operative eye was marked.  Topical anesthesia was administered to the operative eye.     The patient was then to the operative suite and placed in the supine position.  A timeout was performed confirming the patient, procedure to be performed, and all other relevant information.   The patient's face was prepped and draped in the usual fashion for intra-ocular surgery.  A lid speculum was placed into the operative eye and the surgical microscope moved into place and focused.  A superotemporal paracentesis was created using a 20 gauge paracentesis blade.  Shugarcaine was injected into the anterior chamber.  Viscoelastic was injected into the anterior chamber.  A temporal clear-corneal main wound incision was created using a 2.422mmicrokeratome.  A continuous curvilinear capsulorrhexis was initiated using an irrigating cystitome and completed using capsulorrhexis forceps.  Hydrodissection and hydrodeliniation were performed.  Viscoelastic was injected into the anterior chamber.  A phacoemulsification handpiece and a chopper as a  second instrument were used to remove the nucleus and epinucleus. The irrigation/aspiration handpiece was used to remove any remaining cortical material.   The capsular bag was reinflated with viscoelastic, checked, and found to be intact.  The intraocular lens was inserted into the capsular bag.  The irrigation/aspiration handpiece was used to remove any remaining viscoelastic.  The clear corneal wound and paracentesis wounds were then hydrated and checked with Weck-Cels to be watertight.  The lid-speculum was removed. The lower punctum was dilated. A Dextenza implant was placed in the lower canaliculus without complication.  The drape was removed.  The patient's face was cleaned with a wet and dry 4x4. A clear shield was taped over the eye. The patient was taken to the post-operative care unit in good condition, having tolerated the procedure well.  Post-Op Instructions: [facility] and will receive all other orders and instructions.

## 2022-08-22 NOTE — Transfer of Care (Signed)
Immediate Anesthesia Transfer of Care Note  Patient: Kristen [name]  Procedure(s) Performed: CATARACT EXTRACTION PHACO AND INTRAOCULAR LENS PLACEMENT ([facility]) with placement of Corticosteroid (Right: Eye)  Patient Location: Short Stay  Anesthesia Type:MAC  Level of Consciousness: awake  Airway & Oxygen Therapy: Patient Spontanous Breathing  Post-op Assessment: Report given to RN  Post vital signs: Reviewed and stable  Last Vitals:  Vitals Value Taken Time  BP    Temp    Pulse    Resp    SpO2      Last Pain:  Vitals:   08/22/22 0745  PainSc: 0-No pain         Complications: No notable events documented.

## 2022-08-29 DIAGNOSIS — H25812 Combined forms of age-related cataract, left eye: Secondary | ICD-10-CM | POA: Diagnosis not present

## 2022-08-31 ENCOUNTER — Other Ambulatory Visit: Payer: Self-pay

## 2022-08-31 ENCOUNTER — Encounter (HOSPITAL_COMMUNITY): Payer: Self-pay

## 2022-08-31 ENCOUNTER — Encounter (HOSPITAL_COMMUNITY)
Admission: RE | Admit: 2022-08-31 | Discharge: 2022-08-31 | Disposition: A | Discharge status: Home / Self Care | Payer: Medicare Other | Source: Ambulatory Visit | Attending: Ophthalmology | Admitting: Ophthalmology

## 2022-09-01 NOTE — H&P (Addendum)
Surgical History & Physical  Patient Name: Kristen [name] DOB: 11-23-1945  Surgery: Cataract extraction with intraocular lens implant phacoemulsification & placement of corticosteroid implant ; Left Eye  Surgeon: Baruch Goldmann MD Surgery Date:  09-05-22 Pre-Op Date:  08-29-22  HPI: A 51 Yr. old female patient 1. The patient is returning after cataract post-op. The right eye is affected. Status post cataract post-op, which began 1 week ago: Since the last visit, the affected area is doing well. The patient's vision is stable. Patient is following medication instructions. The patient complains of sunlight glare causing poor vision in left eye, started a few months ago. The symptom is constant. The condition's severity is worsening. This is negatively affecting the patient's quality of life and the patient is unable to function adequately in life with the current level of vision. HPI was performed by Baruch Goldmann .  Medical History: Cataracts Arthritis Cancer Diabetes Heart Problem High Blood Pressure LDL acid reflux  Review of Systems Cardiovascular High Blood Pressure Eyes Eye pain, eye strain All recorded systems are negative except as noted above.  Social   Never smoked   Medication Prednisolone-Moxifloxacin-Bromfenac,  Mupirocin, Diclofenac Sodium, Metformin, NovoLog, Isosorbide Mononitrate, Rosuvastatin, Pantoprazole, Jardiance, Atorvastatin, Clopidogrel, Aspirin, Ezetimibe, Gabapentin, Lisinopril, Oxybutynin Chloride, Tresiba,   Sx/Procedures Phaco c IOL OD with Dextenza,  Pacemaker, Mastectomy Lt, Total knee replacement bilateral, Back sx,   Drug Allergies  Sulfa,   History & Physical: Heent: cataract, left eye NECK: supple without bruits LUNGS: lungs clear to auscultation CV: regular rate and rhythm Abdomen: soft and non-tender Impression & Plan: Assessment: 1.  CATARACT EXTRACTION STATUS; Right Eye (Z98.41) 2.  COMBINED FORMS AGE RELATED CATARACT; Left Eye  (H25.812)  Plan: 1.  1 week after cataract surgery. Doing well with improved vision and normal eye pressure. Call with any problems or concerns. Stop drops - Dextenza. Call with any concerning symptoms.  2.  Cataract accounts for the patient's decreased vision. This visual impairment is not correctable with a tolerable change in glasses or contact lenses. Cataract surgery with an implantation of a new lens should significantly improve the visual and functional status of the patient. Discussed all risks, benefits, alternatives, and potential complications. Discussed the procedures and recovery. Patient desires to have surgery. A-scan ordered and performed today for intra-ocular lens calculations. The surgery will be performed in order to improve vision for driving, reading, and for eye examinations. Recommend phacoemulsification with intra-ocular lens. Recommend Dextenza for post-operative pain and inflammation. Left Eye. Surgery required to correct imbalance of vision. Dilates well - shugarcaine by protocol.

## 2022-09-05 ENCOUNTER — Ambulatory Visit (HOSPITAL_COMMUNITY): Payer: Medicare Other | Admitting: Anesthesiology

## 2022-09-05 ENCOUNTER — Ambulatory Visit (HOSPITAL_COMMUNITY)
Admission: RE | Admit: 2022-09-05 | Discharge: 2022-09-05 | Disposition: A | Discharge status: Home / Self Care | Payer: Medicare Other | Attending: Ophthalmology | Admitting: Ophthalmology

## 2022-09-05 ENCOUNTER — Encounter (HOSPITAL_COMMUNITY): Payer: Self-pay | Admitting: Ophthalmology

## 2022-09-05 ENCOUNTER — Other Ambulatory Visit: Payer: Self-pay

## 2022-09-05 ENCOUNTER — Ambulatory Visit (HOSPITAL_BASED_OUTPATIENT_CLINIC_OR_DEPARTMENT_OTHER): Payer: Medicare Other | Admitting: Anesthesiology

## 2022-09-05 ENCOUNTER — Encounter (HOSPITAL_COMMUNITY)
Admission: RE | Disposition: A | Discharge status: Home / Self Care | Payer: Self-pay | Source: Home / Self Care | Attending: Ophthalmology

## 2022-09-05 DIAGNOSIS — N3281 Overactive bladder: Secondary | ICD-10-CM | POA: Insufficient documentation

## 2022-09-05 DIAGNOSIS — E119 Type 2 diabetes mellitus without complications: Secondary | ICD-10-CM

## 2022-09-05 DIAGNOSIS — I2510 Atherosclerotic heart disease of native coronary artery without angina pectoris: Secondary | ICD-10-CM | POA: Insufficient documentation

## 2022-09-05 DIAGNOSIS — I4510 Unspecified right bundle-branch block: Secondary | ICD-10-CM | POA: Diagnosis not present

## 2022-09-05 DIAGNOSIS — K219 Gastro-esophageal reflux disease without esophagitis: Secondary | ICD-10-CM | POA: Insufficient documentation

## 2022-09-05 DIAGNOSIS — Z8673 Personal history of transient ischemic attack (TIA), and cerebral infarction without residual deficits: Secondary | ICD-10-CM | POA: Insufficient documentation

## 2022-09-05 DIAGNOSIS — Z7984 Long term (current) use of oral hypoglycemic drugs: Secondary | ICD-10-CM | POA: Diagnosis not present

## 2022-09-05 DIAGNOSIS — H5712 Ocular pain, left eye: Secondary | ICD-10-CM

## 2022-09-05 DIAGNOSIS — Z955 Presence of coronary angioplasty implant and graft: Secondary | ICD-10-CM | POA: Insufficient documentation

## 2022-09-05 DIAGNOSIS — I10 Essential (primary) hypertension: Secondary | ICD-10-CM | POA: Diagnosis not present

## 2022-09-05 DIAGNOSIS — M1990 Unspecified osteoarthritis, unspecified site: Secondary | ICD-10-CM | POA: Insufficient documentation

## 2022-09-05 DIAGNOSIS — Z9841 Cataract extraction status, right eye: Secondary | ICD-10-CM | POA: Diagnosis not present

## 2022-09-05 DIAGNOSIS — E1136 Type 2 diabetes mellitus with diabetic cataract: Secondary | ICD-10-CM | POA: Insufficient documentation

## 2022-09-05 DIAGNOSIS — Z794 Long term (current) use of insulin: Secondary | ICD-10-CM | POA: Insufficient documentation

## 2022-09-05 DIAGNOSIS — Z79899 Other long term (current) drug therapy: Secondary | ICD-10-CM | POA: Insufficient documentation

## 2022-09-05 DIAGNOSIS — I119 Hypertensive heart disease without heart failure: Secondary | ICD-10-CM | POA: Diagnosis not present

## 2022-09-05 DIAGNOSIS — I25119 Atherosclerotic heart disease of native coronary artery with unspecified angina pectoris: Secondary | ICD-10-CM | POA: Diagnosis not present

## 2022-09-05 DIAGNOSIS — H25812 Combined forms of age-related cataract, left eye: Secondary | ICD-10-CM | POA: Diagnosis not present

## 2022-09-05 DIAGNOSIS — Z950 Presence of cardiac pacemaker: Secondary | ICD-10-CM | POA: Diagnosis not present

## 2022-09-05 LAB — GLUCOSE, CAPILLARY: Glucose-Capillary: 190 mg/dL — ABNORMAL HIGH (ref 70–99)

## 2022-09-05 SURGERY — CATARACT EXTRACTION PHACO AND INTRAOCULAR LENS PLACEMENT (IOC) with placement of Corticosteroid
Anesthesia: Monitor Anesthesia Care | Site: Eye | Laterality: Left

## 2022-09-05 MED ORDER — FENTANYL CITRATE (PF) 100 MCG/2ML IJ SOLN
INTRAMUSCULAR | Status: DC | PRN
  Administered 2022-09-05: 50 ug via INTRAVENOUS

## 2022-09-05 MED ORDER — BSS IO SOLN
INTRAOCULAR | Status: DC | PRN
  Administered 2022-09-05: 15 mL via INTRAOCULAR

## 2022-09-05 MED ORDER — STERILE WATER FOR IRRIGATION IR SOLN
Status: DC | PRN
  Administered 2022-09-05: 25 mL

## 2022-09-05 MED ORDER — POVIDONE-IODINE 5 % OP SOLN
OPHTHALMIC | Status: DC | PRN
  Administered 2022-09-05: 1 via OPHTHALMIC

## 2022-09-05 MED ORDER — CHLORHEXIDINE GLUCONATE 0.12 % MT SOLN: 15.0000 mL | Freq: Once | OROMUCOSAL | Status: DC

## 2022-09-05 MED ORDER — TROPICAMIDE 1 % OP SOLN
1.0000 [drp] | OPHTHALMIC | Status: AC | PRN
  Administered 2022-09-05 (×3): 1 [drp] via OPHTHALMIC

## 2022-09-05 MED ORDER — SODIUM CHLORIDE 0.9% FLUSH
INTRAVENOUS | Status: DC | PRN
  Administered 2022-09-05: 10 mL via INTRAVENOUS

## 2022-09-05 MED ORDER — SODIUM HYALURONATE 23MG/ML IO SOSY
PREFILLED_SYRINGE | INTRAOCULAR | Status: DC | PRN
  Administered 2022-09-05: .6 mL via INTRAOCULAR

## 2022-09-05 MED ORDER — MIDAZOLAM HCL 2 MG/2ML IJ SOLN
INTRAMUSCULAR | Status: AC
  Filled 2022-09-05: qty 2

## 2022-09-05 MED ORDER — DEXAMETHASONE 0.4 MG OP INST
VAGINAL_INSERT | OPHTHALMIC | Status: DC | PRN
  Administered 2022-09-05: .4 mg via OPHTHALMIC

## 2022-09-05 MED ORDER — LACTATED RINGERS IV SOLN: INTRAVENOUS | Status: DC

## 2022-09-05 MED ORDER — EPINEPHRINE PF 1 MG/ML IJ SOLN
INTRAOCULAR | Status: DC | PRN
  Administered 2022-09-05: 500 mL

## 2022-09-05 MED ORDER — TETRACAINE HCL 0.5 % OP SOLN
1.0000 [drp] | OPHTHALMIC | Status: AC | PRN
  Administered 2022-09-05 (×3): 1 [drp] via OPHTHALMIC

## 2022-09-05 MED ORDER — MIDAZOLAM HCL 5 MG/5ML IJ SOLN
INTRAMUSCULAR | Status: DC | PRN
  Administered 2022-09-05: 1 mg via INTRAVENOUS

## 2022-09-05 MED ORDER — SODIUM HYALURONATE 10 MG/ML IO SOLUTION
PREFILLED_SYRINGE | INTRAOCULAR | Status: DC | PRN
  Administered 2022-09-05: .85 mL via INTRAOCULAR

## 2022-09-05 MED ORDER — DEXAMETHASONE 0.4 MG OP INST
VAGINAL_INSERT | OPHTHALMIC | Status: AC
  Filled 2022-09-05: qty 1

## 2022-09-05 MED ORDER — LIDOCAINE HCL 3.5 % OP GEL
1.0000 | Freq: Once | OPHTHALMIC | Status: AC
  Administered 2022-09-05: 1 via OPHTHALMIC

## 2022-09-05 MED ORDER — FENTANYL CITRATE (PF) 100 MCG/2ML IJ SOLN
INTRAMUSCULAR | Status: AC
  Filled 2022-09-05: qty 2

## 2022-09-05 MED ORDER — PHENYLEPHRINE HCL 2.5 % OP SOLN
1.0000 [drp] | OPHTHALMIC | Status: AC | PRN
  Administered 2022-09-05 (×3): 1 [drp] via OPHTHALMIC

## 2022-09-05 MED ORDER — ORAL CARE MOUTH RINSE: 15.0000 mL | Freq: Once | OROMUCOSAL | Status: DC

## 2022-09-05 MED ORDER — LIDOCAINE HCL (PF) 1 % IJ SOLN
INTRAOCULAR | Status: DC | PRN
  Administered 2022-09-05: 1 mL via OPHTHALMIC

## 2022-09-05 SURGICAL SUPPLY — 14 items
CATARACT SUITE SIGHTPATH (MISCELLANEOUS) ×1 IMPLANT
CLOTH BEACON ORANGE TIMEOUT ST (SAFETY) ×1 IMPLANT
EYE SHIELD UNIVERSAL CLEAR (GAUZE/BANDAGES/DRESSINGS) IMPLANT
FEE CATARACT SUITE SIGHTPATH (MISCELLANEOUS) ×1 IMPLANT
GLOVE BIOGEL PI IND STRL 7.0 (GLOVE) ×2 IMPLANT
GLOVE SS BIOGEL STRL SZ 6.5 (GLOVE) IMPLANT
LENS IOL RAYNER 22.5 (Intraocular Lens) ×1 IMPLANT
LENS IOL RAYONE EMV 22.5 (Intraocular Lens) IMPLANT
NDL HYPO 18GX1.5 BLUNT FILL (NEEDLE) ×1 IMPLANT
NEEDLE HYPO 18GX1.5 BLUNT FILL (NEEDLE) ×1 IMPLANT
PAD ARMBOARD 7.5X6 YLW CONV (MISCELLANEOUS) ×1 IMPLANT
SYR TB 1ML LL NO SAFETY (SYRINGE) ×1 IMPLANT
TAPE SURG TRANSPORE 1 IN (GAUZE/BANDAGES/DRESSINGS) IMPLANT
WATER STERILE IRR 250ML POUR (IV SOLUTION) ×1 IMPLANT

## 2022-09-05 NOTE — Anesthesia Postprocedure Evaluation (Signed)
Anesthesia Post Note  Patient: Kristen [name]  Procedure(s) Performed: CATARACT EXTRACTION PHACO AND INTRAOCULAR LENS PLACEMENT (IOC) with placement of Corticosteroid (Left: Eye)  Patient location during evaluation: Phase II Anesthesia Type: MAC Level of consciousness: awake and alert and oriented Pain management: pain level controlled Vital Signs Assessment: post-procedure vital signs reviewed and stable Respiratory status: spontaneous breathing, nonlabored ventilation and respiratory function stable Cardiovascular status: stable and blood pressure returned to baseline Postop Assessment: no apparent nausea or vomiting Anesthetic complications: no  No notable events documented.   Last Vitals:  Vitals:   09/05/22 1124 09/05/22 1259  BP: 135/83 132/70  Pulse: 80 66  Resp: 12 16  Temp: 36.8 C 37.1 C  SpO2: 97% 95%    Last Pain:  Vitals:   09/05/22 1302  TempSrc:   PainSc: 0-No pain                 Amberly Livas C Raekwan Spelman

## 2022-09-05 NOTE — Interval H&P Note (Signed)
History and Physical Interval Note:  09/05/2022 12:31 PM  Kristen [name]  has presented today for surgery, with the diagnosis of combined forms age related cataract;left.  The various methods of treatment have been discussed with the patient and family. After consideration of risks, benefits and other options for treatment, the patient has consented to  Procedure(s) with comments: CATARACT EXTRACTION PHACO AND INTRAOCULAR LENS PLACEMENT ([place]) with placement of Corticosteroid (Left) - CDE: as a surgical intervention.  The patient's history has been reviewed, patient examined, no change in status, stable for surgery.  I have reviewed the patient's chart and labs.  Questions were answered to the patient's satisfaction.     Baruch Goldmann

## 2022-09-05 NOTE — Anesthesia Preprocedure Evaluation (Signed)
Anesthesia Evaluation  Patient identified by MRN, date of birth, ID band Patient awake    Reviewed: Allergy & Precautions, NPO status , Patient's Chart, lab work & pertinent test results  History of Anesthesia Complications Negative for: history of anesthetic complications  Airway Mallampati: II  TM Distance: >3 FB Neck ROM: Full    Dental  (+) Edentulous Upper, Edentulous Lower   Pulmonary    Pulmonary exam normal breath sounds clear to auscultation       Cardiovascular Exercise Tolerance: Good hypertension, Pt. on medications + angina with exertion + CAD and + Cardiac Stents  Normal cardiovascular exam+ dysrhythmias + pacemaker  Rhythm:Regular Rate:Normal  1. Left ventricular ejection fraction, by estimation, is 55 to 60%. The  left ventricle has normal function. The left ventricle has no regional  wall motion abnormalities. There is mild left ventricular hypertrophy.  Left ventricular diastolic parameters  are indeterminate.   2. Right ventricular systolic function is normal. The right ventricular  size is normal.   3. The mitral valve is normal in structure. No evidence of mitral valve  regurgitation. No evidence of mitral stenosis.   4. The tricuspid valve is abnormal.   5. The aortic valve is tricuspid. There is mild calcification of the  aortic valve. There is mild thickening of the aortic valve. Aortic valve  regurgitation is trivial. No aortic stenosis is present.   6. Aortic dilatation noted. There is mild dilatation of the ascending  aorta, measuring 38 mm.   Comparison(s): Echocardiogram done 12/13/16 showed an EF of 55-60%.   EKG - RBBB, LAFB    Neuro/Psych  Headaches PSYCHIATRIC DISORDERS Anxiety Depression    CVA, No Residual Symptoms    GI/Hepatic ,GERD  Medicated and Controlled,,  Endo/Other  diabetes, Well Controlled, Type 2, Oral Hypoglycemic Agents    Renal/GU Renal disease (stones) Bladder  dysfunction (overactive bladder)      Musculoskeletal  (+) Arthritis , Osteoarthritis,    Abdominal   Peds  Hematology  (+) Blood dyscrasia, anemia   Anesthesia Other Findings Left breast cancer  Reproductive/Obstetrics                             Anesthesia Physical Anesthesia Plan  ASA: 3  Anesthesia Plan: MAC   Post-op Pain Management: Minimal or no pain anticipated   Induction: Intravenous  PONV Risk Score and Plan: Treatment may vary due to age or medical condition  Airway Management Planned:   Additional Equipment:   Intra-op Plan:   Post-operative Plan:   Informed Consent: I have reviewed the patients History and Physical, chart, labs and discussed the procedure including the risks, benefits and alternatives for the proposed anesthesia with the patient or authorized representative who has indicated his/her understanding and acceptance.       Plan Discussed with: CRNA and Surgeon  Anesthesia Plan Comments:         Anesthesia Quick Evaluation

## 2022-09-05 NOTE — Discharge Instructions (Signed)
Please discharge patient when stable, will follow up today with Dr. Shakur Lembo at the [facility] [place] office immediately following discharge.  Leave shield in place until visit.  All paperwork with discharge instructions will be given at the office.  [facility] Address:  [address]  [place], [place] [address]  

## 2022-09-05 NOTE — Transfer of Care (Signed)
Immediate Anesthesia Transfer of Care Note  Patient: Kristen [name]  Procedure(s) Performed: CATARACT EXTRACTION PHACO AND INTRAOCULAR LENS PLACEMENT (I[facility]) with placement of Corticosteroid (Left: Eye)  Patient Location: PACU  Anesthesia Type:MAC  Level of Consciousness: awake, alert , and oriented  Airway & Oxygen Therapy: Patient Spontanous Breathing  Post-op Assessment: Report given to RN, Post -op Vital signs reviewed and stable, Patient moving all extremities X 4, and Patient able to stick tongue midline  Post vital signs: Reviewed  Last Vitals:  Vitals Value Taken Time  BP 132/70   Temp 98.8   Pulse 67   Resp 18   SpO2 95     Last Pain:  Vitals:   09/05/22 1124  TempSrc: Oral      Patients Stated Pain Goal: 5 ([id number])  Complications: No notable events documented.

## 2022-09-05 NOTE — Op Note (Signed)
Date of procedure: 09/05/22  Pre-operative diagnosis: Visually significant age-related combined cataract, Left Eye (H25.812)  Post-operative diagnosis:  Visually significant age-related combined cataract, Left Eye (H25.812) 2.   Pain and inflammation following cataract surgery, Left Eye (H57.12)  Procedure:  Removal of cataract via phacoemulsification and insertion of intra-ocular lens Rayner [id number] +22.5D into the capsular bag of the Left Eye 2. Placement of Dextenza Implant, Left Lower Lid  Attending surgeon: Gerda Diss. Gurjit Loconte, MD, MA  Anesthesia: MAC, Topical [place]  Complications: None  Estimated Blood Loss: <81mL (minimal)  Specimens: None  Implants: As above  Indications:  Visually significant age-related cataract, Left Eye  Procedure:  The patient was seen and identified in the pre-operative area. The operative eye was identified and dilated.  The operative eye was marked.  Topical anesthesia was administered to the operative eye.     The patient was then to the operative suite and placed in the supine position.  A timeout was performed confirming the patient, procedure to be performed, and all other relevant information.   The patient's face was prepped and draped in the usual fashion for intra-ocular surgery.  A lid speculum was placed into the operative eye and the surgical microscope moved into place and focused.  An inferotemporal paracentesis was created using a 20 gauge paracentesis blade.  Shugarcaine was injected into the anterior chamber.  Viscoelastic was injected into the anterior chamber.  A temporal clear-corneal main wound incision was created using a 2.42mm microkeratome.  A continuous curvilinear capsulorrhexis was initiated using an irrigating cystitome and completed using capsulorrhexis forceps.  Hydrodissection and hydrodeliniation were performed.  Viscoelastic was injected into the anterior chamber.  A phacoemulsification handpiece and a chopper as a second  instrument were used to remove the nucleus and epinucleus. The irrigation/aspiration handpiece was used to remove any remaining cortical material.   The capsular bag was reinflated with viscoelastic, checked, and found to be intact.  The intraocular lens was inserted into the capsular bag.  The irrigation/aspiration handpiece was used to remove any remaining viscoelastic.  The clear corneal wound and paracentesis wounds were then hydrated and checked with Weck-Cels to be watertight.   The lid-speculum was removed. The lower punctum was dilated. A Dextenza implant was placed in the lower canaliculus without complication.   The drape was removed.  The patient's face was cleaned with a wet and dry 4x4.   A clear shield was taped over the eye. The patient was taken to the post-operative care unit in good condition, having tolerated the procedure well.  Post-Op Instructions: The patient will follow up at [facility] for a same day post-operative evaluation and will receive all other orders and instructions.

## 2022-09-10 DIAGNOSIS — E1165 Type 2 diabetes mellitus with hyperglycemia: Secondary | ICD-10-CM | POA: Diagnosis not present

## 2022-09-21 ENCOUNTER — Ambulatory Visit (INDEPENDENT_AMBULATORY_CARE_PROVIDER_SITE_OTHER): Payer: Medicare Other

## 2022-09-21 DIAGNOSIS — I441 Atrioventricular block, second degree: Secondary | ICD-10-CM | POA: Diagnosis not present

## 2022-09-21 LAB — CUP PACEART REMOTE DEVICE CHECK
Battery Remaining Percentage: 55 %
Brady Statistic RA Percent Paced: 72 %
Brady Statistic RV Percent Paced: 92 %
Date Time Interrogation Session: 20240410070717
Implantable Lead Connection Status: 753985
Implantable Lead Connection Status: 753985
Implantable Lead Implant Date: 20180709
Implantable Lead Implant Date: 20180709
Implantable Lead Location: 753859
Implantable Lead Location: 753860
Implantable Lead Model: 5076
Implantable Lead Model: 5076
Implantable Pulse Generator Implant Date: 20180709
Lead Channel Impedance Value: 332 Ohm
Lead Channel Impedance Value: 566 Ohm
Lead Channel Pacing Threshold Amplitude: 0.5 V
Lead Channel Pacing Threshold Amplitude: 1 V
Lead Channel Pacing Threshold Pulse Width: 0.4 ms
Lead Channel Pacing Threshold Pulse Width: 0.4 ms
Lead Channel Sensing Intrinsic Amplitude: 11.1 mV
Lead Channel Sensing Intrinsic Amplitude: 3.8 mV
Lead Channel Setting Pacing Amplitude: 2 V
Lead Channel Setting Pacing Amplitude: 2.4 V
Lead Channel Setting Pacing Pulse Width: 0.4 ms
Pulse Gen Model: 407145
Pulse Gen Serial Number: 69106657

## 2022-10-05 DIAGNOSIS — S40011A Contusion of right shoulder, initial encounter: Secondary | ICD-10-CM | POA: Diagnosis not present

## 2022-10-05 DIAGNOSIS — S4991XA Unspecified injury of right shoulder and upper arm, initial encounter: Secondary | ICD-10-CM | POA: Diagnosis not present

## 2022-10-05 DIAGNOSIS — M25511 Pain in right shoulder: Secondary | ICD-10-CM | POA: Diagnosis not present

## 2022-10-05 DIAGNOSIS — R0689 Other abnormalities of breathing: Secondary | ICD-10-CM | POA: Diagnosis not present

## 2022-10-05 DIAGNOSIS — M25561 Pain in right knee: Secondary | ICD-10-CM | POA: Diagnosis not present

## 2022-10-05 DIAGNOSIS — I10 Essential (primary) hypertension: Secondary | ICD-10-CM | POA: Diagnosis not present

## 2022-10-05 DIAGNOSIS — R1031 Right lower quadrant pain: Secondary | ICD-10-CM | POA: Diagnosis not present

## 2022-10-05 DIAGNOSIS — R1011 Right upper quadrant pain: Secondary | ICD-10-CM | POA: Diagnosis not present

## 2022-10-05 DIAGNOSIS — I700 Atherosclerosis of aorta: Secondary | ICD-10-CM | POA: Diagnosis not present

## 2022-10-05 DIAGNOSIS — Z882 Allergy status to sulfonamides status: Secondary | ICD-10-CM | POA: Diagnosis not present

## 2022-10-05 DIAGNOSIS — E279 Disorder of adrenal gland, unspecified: Secondary | ICD-10-CM | POA: Diagnosis not present

## 2022-10-05 DIAGNOSIS — S3991XA Unspecified injury of abdomen, initial encounter: Secondary | ICD-10-CM | POA: Diagnosis not present

## 2022-10-05 DIAGNOSIS — R109 Unspecified abdominal pain: Secondary | ICD-10-CM | POA: Diagnosis not present

## 2022-10-05 DIAGNOSIS — S43491A Other sprain of right shoulder joint, initial encounter: Secondary | ICD-10-CM | POA: Diagnosis not present

## 2022-10-05 DIAGNOSIS — R0789 Other chest pain: Secondary | ICD-10-CM | POA: Diagnosis not present

## 2022-10-05 DIAGNOSIS — S299XXA Unspecified injury of thorax, initial encounter: Secondary | ICD-10-CM | POA: Diagnosis not present

## 2022-10-05 DIAGNOSIS — M25519 Pain in unspecified shoulder: Secondary | ICD-10-CM | POA: Diagnosis not present

## 2022-10-05 DIAGNOSIS — W19XXXA Unspecified fall, initial encounter: Secondary | ICD-10-CM | POA: Diagnosis not present

## 2022-10-11 DIAGNOSIS — E1165 Type 2 diabetes mellitus with hyperglycemia: Secondary | ICD-10-CM | POA: Diagnosis not present

## 2022-10-17 DIAGNOSIS — M25569 Pain in unspecified knee: Secondary | ICD-10-CM | POA: Diagnosis not present

## 2022-10-17 DIAGNOSIS — Z96651 Presence of right artificial knee joint: Secondary | ICD-10-CM | POA: Diagnosis not present

## 2022-10-17 DIAGNOSIS — S46811A Strain of other muscles, fascia and tendons at shoulder and upper arm level, right arm, initial encounter: Secondary | ICD-10-CM | POA: Diagnosis not present

## 2022-10-17 DIAGNOSIS — M542 Cervicalgia: Secondary | ICD-10-CM | POA: Diagnosis not present

## 2022-10-21 DIAGNOSIS — I25119 Atherosclerotic heart disease of native coronary artery with unspecified angina pectoris: Secondary | ICD-10-CM | POA: Diagnosis not present

## 2022-10-21 DIAGNOSIS — E1165 Type 2 diabetes mellitus with hyperglycemia: Secondary | ICD-10-CM | POA: Diagnosis not present

## 2022-10-21 DIAGNOSIS — I10 Essential (primary) hypertension: Secondary | ICD-10-CM | POA: Diagnosis not present

## 2022-10-21 DIAGNOSIS — Z299 Encounter for prophylactic measures, unspecified: Secondary | ICD-10-CM | POA: Diagnosis not present

## 2022-10-21 DIAGNOSIS — M25561 Pain in right knee: Secondary | ICD-10-CM | POA: Diagnosis not present

## 2022-10-21 DIAGNOSIS — G8929 Other chronic pain: Secondary | ICD-10-CM | POA: Diagnosis not present

## 2022-10-24 DIAGNOSIS — M25561 Pain in right knee: Secondary | ICD-10-CM | POA: Diagnosis not present

## 2022-10-24 DIAGNOSIS — Z96651 Presence of right artificial knee joint: Secondary | ICD-10-CM | POA: Diagnosis not present

## 2022-10-24 DIAGNOSIS — M25461 Effusion, right knee: Secondary | ICD-10-CM | POA: Diagnosis not present

## 2022-10-28 NOTE — Progress Notes (Signed)
Remote pacemaker transmission.   

## 2022-10-31 ENCOUNTER — Encounter: Payer: Self-pay | Admitting: Cardiology

## 2022-10-31 ENCOUNTER — Ambulatory Visit: Payer: Medicare Other | Attending: Cardiology | Admitting: Cardiology

## 2022-10-31 VITALS — BP 110/62 | HR 65 | Ht 62.0 in | Wt 205.8 lb

## 2022-10-31 DIAGNOSIS — I25119 Atherosclerotic heart disease of native coronary artery with unspecified angina pectoris: Secondary | ICD-10-CM | POA: Diagnosis not present

## 2022-10-31 DIAGNOSIS — I10 Essential (primary) hypertension: Secondary | ICD-10-CM

## 2022-10-31 DIAGNOSIS — E782 Mixed hyperlipidemia: Secondary | ICD-10-CM

## 2022-10-31 NOTE — Progress Notes (Signed)
    Cardiology Office Note  Date: 10/31/2022   ID: Kristen [name], DOB Jun 04, 1946, MRN [id number]  History of Present Illness: Kristen [name] is a 77 y.o. female last seen in November 2023.  She is here for a routine visit.  Overall doing well from a cardiac perspective, no angina or nitroglycerin use in the interim.  She reports NYHA class II dyspnea.  No palpitations or syncope.  Biotronik pacemaker in place with follow-up by Dr. Nelly Laurence.  Device interrogation in April revealed normal function.  ECG today shows dual-chamber pacing.  I went over her medications.  She reports no bleeding problems on dual antiplatelet therapy.  LDL was 50 in December 2023 on Lipitor 40 mg daily and Zetia.  Physical Exam: VS:  BP 110/62   Pulse 65   Ht 5\' 2"  (1.575 m)   Wt 205 lb 12.8 oz (93.4 kg)   SpO2 93%   BMI 37.64 kg/m , BMI Body mass index is 37.64 kg/m.  Wt Readings from Last 3 Encounters:  10/31/22 205 lb 12.8 oz (93.4 kg)  09/05/22 209 lb 14.1 oz (95.2 kg)  08/31/22 209 lb 14.1 oz (95.2 kg)    General: Patient appears comfortable at rest. HEENT: Conjunctiva and lids normal. Neck: Supple, no elevated JVP or carotid bruits. Lungs: Clear to auscultation, nonlabored breathing at rest. Cardiac: Regular rate and rhythm, no S3, 1/6 systolic murmur. Extremities: No pitting edema.  ECG:  An ECG dated 10/13/2021 was personally reviewed today and demonstrated:  Atrial paced rhythm with old anterior infarct pattern and left anterior fascicular block.  Labwork:  December 2023: Hemoglobin 12.5, platelets 263, BUN 27, creatinine 1.15, potassium 5.3, AST 17, ALT 20, cholesterol 114, triglycerides 205, HDL 31, LDL 50, TSH 0.919  Other Studies Reviewed Today:  Echocardiogram 11/01/2021:  1. Left ventricular ejection fraction, by estimation, is 55 to 60%. The  left ventricle has normal function. The left ventricle has no regional  wall motion abnormalities. There is mild left ventricular hypertrophy.   Left ventricular diastolic parameters  are indeterminate.   2. Right ventricular systolic function is normal. The right ventricular  size is normal.   3. The mitral valve is normal in structure. No evidence of mitral valve  regurgitation. No evidence of mitral stenosis.   4. The tricuspid valve is abnormal.   5. The aortic valve is tricuspid. There is mild calcification of the  aortic valve. There is mild thickening of the aortic valve. Aortic valve  regurgitation is trivial. No aortic stenosis is present.   6. Aortic dilatation noted. There is mild dilatation of the ascending  aorta, measuring 38 mm.   Assessment and Plan:  1.  CAD status post DES to the LAD in September 2020.  LVEF 55 to 60% by echocardiogram in May 2023.  She reports no active angina or nitroglycerin use, ECG reviewed.  Continue aspirin, Plavix, Norvasc, Lipitor, Zetia, Imdur, Jardiance, and as needed nitroglycerin.  2.  Symptomatic second-degree heart block status post Biotronik pacemaker with follow-up by Dr. Nelly Laurence.  3.  Mixed hyperlipidemia.  LDL 50 in December 2023.  She continues on Lipitor and Zetia.  4.  Essential hypertension.  Blood pressure well-controlled today, no changes made to current regimen.  She is also on lisinopril in addition to the above medications.  Disposition:  Follow up  6 months.  Signed, Jonelle Sidle, M.D., F.A.C.C. [facility] at [facility]

## 2022-10-31 NOTE — Patient Instructions (Addendum)

## 2022-11-01 DIAGNOSIS — Z96651 Presence of right artificial knee joint: Secondary | ICD-10-CM | POA: Diagnosis not present

## 2022-11-01 DIAGNOSIS — M25569 Pain in unspecified knee: Secondary | ICD-10-CM | POA: Diagnosis not present

## 2022-11-11 DIAGNOSIS — E1165 Type 2 diabetes mellitus with hyperglycemia: Secondary | ICD-10-CM | POA: Diagnosis not present

## 2022-11-14 DIAGNOSIS — Z96651 Presence of right artificial knee joint: Secondary | ICD-10-CM | POA: Diagnosis not present

## 2022-11-14 DIAGNOSIS — M542 Cervicalgia: Secondary | ICD-10-CM | POA: Diagnosis not present

## 2022-11-14 DIAGNOSIS — M25569 Pain in unspecified knee: Secondary | ICD-10-CM | POA: Diagnosis not present

## 2022-12-11 DIAGNOSIS — E1165 Type 2 diabetes mellitus with hyperglycemia: Secondary | ICD-10-CM | POA: Diagnosis not present

## 2022-12-21 ENCOUNTER — Ambulatory Visit (INDEPENDENT_AMBULATORY_CARE_PROVIDER_SITE_OTHER): Payer: Medicare Other

## 2022-12-21 DIAGNOSIS — I441 Atrioventricular block, second degree: Secondary | ICD-10-CM

## 2022-12-21 LAB — CUP PACEART REMOTE DEVICE CHECK
Battery Voltage: 50
Date Time Interrogation Session: 20240710074912
Implantable Lead Connection Status: 753985
Implantable Lead Connection Status: 753985
Implantable Lead Implant Date: 20180709
Implantable Lead Implant Date: 20180709
Implantable Lead Location: 753859
Implantable Lead Location: 753860
Implantable Lead Model: 5076
Implantable Lead Model: 5076
Implantable Pulse Generator Implant Date: 20180709
Pulse Gen Model: 407145
Pulse Gen Serial Number: 69106657

## 2023-01-11 DIAGNOSIS — E1165 Type 2 diabetes mellitus with hyperglycemia: Secondary | ICD-10-CM | POA: Diagnosis not present

## 2023-01-11 NOTE — Progress Notes (Signed)
Remote pacemaker transmission.   

## 2023-01-12 ENCOUNTER — Telehealth: Payer: Self-pay | Admitting: *Deleted

## 2023-01-12 ENCOUNTER — Encounter: Payer: Self-pay | Admitting: *Deleted

## 2023-01-12 NOTE — Patient Outreach (Signed)
  Care Coordination   Initial Visit Note   01/12/2023 Name: Kristen [name] MRN: [id number] DOB: 11/17/1945  Kristen [name] is a 77 y.o. year old female who sees Vyas, Dhruv B, MD for primary care. I  spoke with daughter, Kristen [name], by telephone today.   What matters to the patients health and wellness today?  Having a backup for transportation    Goals Addressed             This Visit's Progress    COMPLETED: Care Coordination Services (No Follow-up Required)       Care Coordination Goals: Patient/daughter will reach out to [employer] regarding transportation assistance if needed Secure email sent to daughter with information on [employer] per her request Patient/daughter will reach out to RN Care Coordinator with any resource or care coordination needs [phone]         SDOH assessments and interventions completed:  Yes  SDOH Interventions Today    Flowsheet Row Most Recent Value  SDOH Interventions   Transportation Interventions Intervention Not Indicated  Financial Strain Interventions Intervention Not Indicated        Care Coordination Interventions:  Yes, provided  Interventions Today    Flowsheet Row Most Recent Value  Chronic Disease   Chronic disease during today's visit Diabetes  General Interventions   General Interventions Discussed/Reviewed General Interventions Discussed, General Interventions Reviewed, Labs, Durable Medical Equipment (DME), Doctor Visits, Community Resources  Labs Hgb A1c every 3 months  Doctor Visits Discussed/Reviewed Doctor Visits Discussed, Doctor Visits Reviewed, PCP  Horticulturist, commercial (DME) Glucomoter  [Patient checks blood sugar regularly and daughter reports that it is doing "good." Does not have any readings available at time of cold call.]  PCP/Specialist Visits Compliance with follow-up visit  Exercise Interventions   Exercise Discussed/Reviewed Physical Activity  Physical Activity Discussed/Reviewed Physical Activity Discussed,  Physical Activity Reviewed  [able to perform ADLs. Daughter helps with transportation. Ramp is being built this weekend and patient uses a cane for ambulation. Daughter bought her a power wheelchair to use when she's out shopping.]  Education Interventions   Education Provided Provided Education  Provided Verbal Education On [employer], When to see the doctor, Blood Sugar Monitoring  [Information on [employer] transportation services emailed to daughter at [email or web address] per daughter's request]  Safety Interventions   Safety Discussed/Reviewed Safety Discussed, Safety Reviewed, Fall Risk, Home Safety  Home Safety Assistive Devices      Ms. Misch's daughter, Kristen [name], was given information about Care Coordination services today including:   The Care Coordination services include support from the care team which includes your Nurse Coordinator, Clinical Social Worker, or Pharmacist.  The Care Coordination team is here to help remove barriers to the health concerns and goals most important to you. Care Coordination services are voluntary, and the patient may decline or stop services at any time by request to their care team member.   Care Coordination Consent Status: Patient did not agree to participate in care coordination services at this time.   Follow up plan: No further intervention required.   Encounter Outcome:  Pt. Visit Completed   Demetrios Loll, BSN, RN-BC RN Care Coordinator [facility]  [facility] Direct Dial: [phone] Main #: [phone]

## 2023-01-30 DIAGNOSIS — N1831 Chronic kidney disease, stage 3a: Secondary | ICD-10-CM | POA: Diagnosis not present

## 2023-01-30 DIAGNOSIS — E1140 Type 2 diabetes mellitus with diabetic neuropathy, unspecified: Secondary | ICD-10-CM | POA: Diagnosis not present

## 2023-01-30 DIAGNOSIS — E1165 Type 2 diabetes mellitus with hyperglycemia: Secondary | ICD-10-CM | POA: Diagnosis not present

## 2023-01-30 DIAGNOSIS — Z299 Encounter for prophylactic measures, unspecified: Secondary | ICD-10-CM | POA: Diagnosis not present

## 2023-01-30 DIAGNOSIS — I10 Essential (primary) hypertension: Secondary | ICD-10-CM | POA: Diagnosis not present

## 2023-02-11 DIAGNOSIS — E1165 Type 2 diabetes mellitus with hyperglycemia: Secondary | ICD-10-CM | POA: Diagnosis not present

## 2023-02-17 ENCOUNTER — Encounter: Payer: Medicare Other | Admitting: Cardiovascular Disease

## 2023-03-13 DIAGNOSIS — E1165 Type 2 diabetes mellitus with hyperglycemia: Secondary | ICD-10-CM | POA: Diagnosis not present

## 2023-03-17 ENCOUNTER — Encounter: Payer: Self-pay | Admitting: Cardiovascular Disease

## 2023-03-17 ENCOUNTER — Ambulatory Visit: Payer: 59 | Attending: Cardiovascular Disease | Admitting: Cardiovascular Disease

## 2023-03-17 VITALS — BP 115/64 | HR 60 | Ht 62.0 in | Wt 200.4 lb

## 2023-03-17 DIAGNOSIS — I442 Atrioventricular block, complete: Secondary | ICD-10-CM

## 2023-03-17 DIAGNOSIS — I209 Angina pectoris, unspecified: Secondary | ICD-10-CM

## 2023-03-17 LAB — CUP PACEART INCLINIC DEVICE CHECK
Date Time Interrogation Session: 20241004155840
Implantable Lead Connection Status: 753985
Implantable Lead Connection Status: 753985
Implantable Lead Implant Date: 20180709
Implantable Lead Implant Date: 20180709
Implantable Lead Location: 753859
Implantable Lead Location: 753860
Implantable Lead Model: 5076
Implantable Lead Model: 5076
Implantable Pulse Generator Implant Date: 20180709
Pulse Gen Model: 407145
Pulse Gen Serial Number: 69106657

## 2023-03-17 NOTE — Progress Notes (Signed)
    PCP: Ignatius Specking, MD Primary Cardiologist: Dr Diona Browner Primary EP:  Dr Nelly Laurence  Kristen [name] is a 77 y.o. female who presents today for routine electrophysiology followup.  Since last being seen in our clinic, the patient reports doing very well.    She reports some increased fatigue in the past few days. She hasn't had increased dependent edema or supine dyspnea.   Today, she denies symptoms of palpitations, chest pain, shortness of breath,  lower extremity edema, or dizziness.         Physical Exam: Vitals:   03/17/23 1012  BP: 115/64  Pulse: 60  SpO2: 93%  Weight: 200 lb 6.4 oz (90.9 kg)  Height: 5\' 2"  (1.575 m)    Gen: Appears comfortable, well-nourished CV: RRR, no dependent edema The device site is normal -- no tenderness, edema, drainage, redness, threatened erosion.  Pulm: breathing easily   Pacemaker interrogation- reviewed in detail today,  See PACEART report   TTE May 22nd 2023 EF 55 to 60%.  Assessment and Plan:  1. Symptomatic second degree heart block Normal pacemaker function  V-pacing burden is unchanged See Pace Art report No changes today she is not device dependant today  2. CAD S/p LAD PCI 2000 Stable angina  3. HTN Stable No change required today  4. Obesity Body mass index is 36.65 kg/m. Lifestyle modification advised    Return in a year  Maurice Small, MD 03/17/2023 10:36 AM  '

## 2023-03-17 NOTE — Patient Instructions (Signed)
Medication Instructions:  Continue all current medications.  Labwork: none  Testing/Procedures: none  Follow-Up: 1 year   Any Other Special Instructions Will Be Listed Below (If Applicable).  If you need a refill on your cardiac medications before your next appointment, please call your pharmacy.  

## 2023-03-22 ENCOUNTER — Ambulatory Visit (INDEPENDENT_AMBULATORY_CARE_PROVIDER_SITE_OTHER): Payer: 59

## 2023-03-22 DIAGNOSIS — I441 Atrioventricular block, second degree: Secondary | ICD-10-CM

## 2023-03-22 LAB — CUP PACEART REMOTE DEVICE CHECK
Battery Remaining Percentage: 50 %
Brady Statistic RA Percent Paced: 87 %
Brady Statistic RV Percent Paced: 100 %
Date Time Interrogation Session: 20241009075400
Implantable Lead Connection Status: 753985
Implantable Lead Connection Status: 753985
Implantable Lead Implant Date: 20180709
Implantable Lead Implant Date: 20180709
Implantable Lead Location: 753859
Implantable Lead Location: 753860
Implantable Lead Model: 5076
Implantable Lead Model: 5076
Implantable Pulse Generator Implant Date: 20180709
Lead Channel Impedance Value: 332 Ohm
Lead Channel Impedance Value: 546 Ohm
Lead Channel Pacing Threshold Amplitude: 0.5 V
Lead Channel Pacing Threshold Amplitude: 0.9 V
Lead Channel Pacing Threshold Pulse Width: 0.4 ms
Lead Channel Pacing Threshold Pulse Width: 0.4 ms
Lead Channel Sensing Intrinsic Amplitude: 16 mV
Lead Channel Sensing Intrinsic Amplitude: 3.8 mV
Lead Channel Setting Pacing Amplitude: 2 V
Lead Channel Setting Pacing Amplitude: 2.4 V
Lead Channel Setting Pacing Pulse Width: 0.4 ms
Pulse Gen Model: 407145
Pulse Gen Serial Number: 69106657

## 2023-04-12 DIAGNOSIS — E1165 Type 2 diabetes mellitus with hyperglycemia: Secondary | ICD-10-CM | POA: Diagnosis not present

## 2023-04-12 NOTE — Progress Notes (Signed)
Remote pacemaker transmission.   

## 2023-05-08 DIAGNOSIS — E1165 Type 2 diabetes mellitus with hyperglycemia: Secondary | ICD-10-CM | POA: Diagnosis not present

## 2023-05-08 DIAGNOSIS — I10 Essential (primary) hypertension: Secondary | ICD-10-CM | POA: Diagnosis not present

## 2023-05-08 DIAGNOSIS — L98499 Non-pressure chronic ulcer of skin of other sites with unspecified severity: Secondary | ICD-10-CM | POA: Diagnosis not present

## 2023-05-08 DIAGNOSIS — Z299 Encounter for prophylactic measures, unspecified: Secondary | ICD-10-CM | POA: Diagnosis not present

## 2023-05-09 ENCOUNTER — Ambulatory Visit: Payer: 59 | Attending: Cardiology | Admitting: Cardiology

## 2023-05-09 ENCOUNTER — Encounter: Payer: Self-pay | Admitting: Cardiology

## 2023-05-09 VITALS — BP 122/66 | HR 75 | Ht 62.0 in | Wt 199.8 lb

## 2023-05-09 DIAGNOSIS — I441 Atrioventricular block, second degree: Secondary | ICD-10-CM | POA: Diagnosis not present

## 2023-05-09 DIAGNOSIS — E782 Mixed hyperlipidemia: Secondary | ICD-10-CM | POA: Diagnosis not present

## 2023-05-09 DIAGNOSIS — I25119 Atherosclerotic heart disease of native coronary artery with unspecified angina pectoris: Secondary | ICD-10-CM | POA: Diagnosis not present

## 2023-05-09 MED ORDER — NITROGLYCERIN 0.4 MG SL SUBL: 0.4000 mg | SUBLINGUAL_TABLET | SUBLINGUAL | 2 refills | Status: AC | PRN

## 2023-05-09 NOTE — Progress Notes (Signed)
    Cardiology Office Note  Date: 05/09/2023   ID: Shamane Cargill, DOB 04-Feb-1946, MRN [id number]  History of Present Illness: Kristen [name] is a 77 y.o. female last seen in May.  She is here for a routine visit.  Reports no increasing angina with only occasional nitroglycerin use.  NYHA class II dyspnea.  No palpitations or syncope.  Biotronik pacemaker in place with follow-up with Dr. Nelly Laurence.  Last device interrogation in October revealed normal function.  I reviewed her medications.  Current cardiac regimen includes aspirin, Norvasc, Lipitor, Plavix, Zetia, Imdur, Jardiance, lisinopril, and as needed nitroglycerin.  Physical Exam: VS:  BP 122/66 (BP Location: Right Arm)   Pulse 75   Ht 5\' 2"  (1.575 m)   Wt 199 lb 12.8 oz (90.6 kg)   SpO2 94%   BMI 36.54 kg/m , BMI Body mass index is 36.54 kg/m.  Wt Readings from Last 3 Encounters:  05/09/23 199 lb 12.8 oz (90.6 kg)  03/17/23 200 lb 6.4 oz (90.9 kg)  10/31/22 205 lb 12.8 oz (93.4 kg)    General: Patient appears comfortable at rest. HEENT: Conjunctiva and lids normal. Neck: Supple, no elevated JVP or carotid bruits. Lungs: Clear to auscultation, nonlabored breathing at rest. Cardiac: Regular rate and rhythm, no S3, 1/6 systolic murmur. Extremities: No pitting edema.  ECG:  An ECG dated 10/31/2022 was personally reviewed today and demonstrated:  Dual-chamber pacing.  Labwork:  December 2023: Hemoglobin 12.5, platelets 263, BUN 27, creatinine 1.15, potassium 5.3, AST 17, ALT 20, cholesterol 114, triglycerides 205, HDL 31, LDL 50, TSH 0.919  Other Studies Reviewed Today:  No interval cardiac testing for review today.  Assessment and Plan:  1.  CAD status post DES to the LAD in September 2020.  LVEF 55 to 60% by echocardiogram in May 2023.  No progressive angina or increasing nitroglycerin requirement.  Continue medical therapy which includes aspirin, Plavix, Lipitor, Imdur, Jardiance, and as needed nitroglycerin which will  be refilled.   2.  Symptomatic second-degree heart block status post Biotronik pacemaker with follow-up by Dr. Nelly Laurence.  Symptomatically stable and with normal device function on interrogation.   3.  Mixed hyperlipidemia.  LDL 50 in December 2023.  She continues on Lipitor and Zetia.  No changes made today.   4.  Primary hypertension.  Blood pressure is well-controlled today.  Continue with current regimen.  Disposition:  Follow up 6 months.  Signed, Jonelle Sidle, M.D., F.A.C.C. [facility] at [facility]

## 2023-05-09 NOTE — Patient Instructions (Addendum)

## 2023-05-12 DIAGNOSIS — E1165 Type 2 diabetes mellitus with hyperglycemia: Secondary | ICD-10-CM | POA: Diagnosis not present

## 2023-05-17 DIAGNOSIS — N6452 Nipple discharge: Secondary | ICD-10-CM | POA: Diagnosis not present

## 2023-05-17 DIAGNOSIS — N6315 Unspecified lump in the right breast, overlapping quadrants: Secondary | ICD-10-CM | POA: Diagnosis not present

## 2023-05-17 DIAGNOSIS — R92313 Mammographic fatty tissue density, bilateral breasts: Secondary | ICD-10-CM | POA: Diagnosis not present

## 2023-05-17 DIAGNOSIS — L98499 Non-pressure chronic ulcer of skin of other sites with unspecified severity: Secondary | ICD-10-CM | POA: Diagnosis not present

## 2023-05-17 DIAGNOSIS — N6459 Other signs and symptoms in breast: Secondary | ICD-10-CM | POA: Diagnosis not present

## 2023-05-26 ENCOUNTER — Other Ambulatory Visit: Payer: Self-pay | Admitting: Internal Medicine

## 2023-05-26 DIAGNOSIS — L98499 Non-pressure chronic ulcer of skin of other sites with unspecified severity: Secondary | ICD-10-CM | POA: Diagnosis not present

## 2023-05-26 DIAGNOSIS — Z0000 Encounter for general adult medical examination without abnormal findings: Secondary | ICD-10-CM | POA: Diagnosis not present

## 2023-05-26 DIAGNOSIS — E7800 Pure hypercholesterolemia, unspecified: Secondary | ICD-10-CM | POA: Diagnosis not present

## 2023-05-26 DIAGNOSIS — Z79899 Other long term (current) drug therapy: Secondary | ICD-10-CM | POA: Diagnosis not present

## 2023-05-26 DIAGNOSIS — Z299 Encounter for prophylactic measures, unspecified: Secondary | ICD-10-CM | POA: Diagnosis not present

## 2023-05-26 DIAGNOSIS — I10 Essential (primary) hypertension: Secondary | ICD-10-CM | POA: Diagnosis not present

## 2023-05-26 DIAGNOSIS — N6452 Nipple discharge: Secondary | ICD-10-CM

## 2023-05-26 DIAGNOSIS — Z7189 Other specified counseling: Secondary | ICD-10-CM | POA: Diagnosis not present

## 2023-05-26 DIAGNOSIS — R5383 Other fatigue: Secondary | ICD-10-CM | POA: Diagnosis not present

## 2023-06-12 DIAGNOSIS — E1165 Type 2 diabetes mellitus with hyperglycemia: Secondary | ICD-10-CM | POA: Diagnosis not present

## 2023-06-21 ENCOUNTER — Ambulatory Visit (INDEPENDENT_AMBULATORY_CARE_PROVIDER_SITE_OTHER): Payer: 59

## 2023-06-21 DIAGNOSIS — I441 Atrioventricular block, second degree: Secondary | ICD-10-CM

## 2023-06-21 LAB — CUP PACEART REMOTE DEVICE CHECK
Date Time Interrogation Session: 20250108080226
Implantable Lead Connection Status: 753985
Implantable Lead Connection Status: 753985
Implantable Lead Implant Date: 20180709
Implantable Lead Implant Date: 20180709
Implantable Lead Location: 753859
Implantable Lead Location: 753860
Implantable Lead Model: 5076
Implantable Lead Model: 5076
Implantable Pulse Generator Implant Date: 20180709
Pulse Gen Model: 407145
Pulse Gen Serial Number: 69106657

## 2023-07-12 DIAGNOSIS — E1165 Type 2 diabetes mellitus with hyperglycemia: Secondary | ICD-10-CM | POA: Diagnosis not present

## 2023-08-02 NOTE — Progress Notes (Signed)
 Remote pacemaker transmission.

## 2023-08-11 DIAGNOSIS — E1165 Type 2 diabetes mellitus with hyperglycemia: Secondary | ICD-10-CM | POA: Diagnosis not present

## 2023-09-10 DIAGNOSIS — E1165 Type 2 diabetes mellitus with hyperglycemia: Secondary | ICD-10-CM | POA: Diagnosis not present

## 2023-09-15 DIAGNOSIS — N1831 Chronic kidney disease, stage 3a: Secondary | ICD-10-CM | POA: Diagnosis not present

## 2023-09-15 DIAGNOSIS — I25119 Atherosclerotic heart disease of native coronary artery with unspecified angina pectoris: Secondary | ICD-10-CM | POA: Diagnosis not present

## 2023-09-15 DIAGNOSIS — Z299 Encounter for prophylactic measures, unspecified: Secondary | ICD-10-CM | POA: Diagnosis not present

## 2023-09-15 DIAGNOSIS — J329 Chronic sinusitis, unspecified: Secondary | ICD-10-CM | POA: Diagnosis not present

## 2023-09-20 ENCOUNTER — Ambulatory Visit (INDEPENDENT_AMBULATORY_CARE_PROVIDER_SITE_OTHER): Payer: Self-pay

## 2023-09-20 DIAGNOSIS — I441 Atrioventricular block, second degree: Secondary | ICD-10-CM

## 2023-09-21 LAB — CUP PACEART REMOTE DEVICE CHECK
Battery Voltage: 45
Date Time Interrogation Session: 20250410081449
Implantable Lead Connection Status: 753985
Implantable Lead Connection Status: 753985
Implantable Lead Implant Date: 20180709
Implantable Lead Implant Date: 20180709
Implantable Lead Location: 753859
Implantable Lead Location: 753860
Implantable Lead Model: 5076
Implantable Lead Model: 5076
Implantable Pulse Generator Implant Date: 20180709
Pulse Gen Model: 407145
Pulse Gen Serial Number: 69106657

## 2023-10-11 DIAGNOSIS — E1165 Type 2 diabetes mellitus with hyperglycemia: Secondary | ICD-10-CM | POA: Diagnosis not present

## 2023-10-19 ENCOUNTER — Telehealth: Payer: Self-pay | Admitting: *Deleted

## 2023-10-19 NOTE — Telephone Encounter (Signed)
 Patient states monitor is unplugged and she will plug in. Will recheck 10/20/23.

## 2023-10-19 NOTE — Telephone Encounter (Signed)
  No messages received for at least 21 days Last message received 21 days ago. The patient will be deactivated in 69 days.

## 2023-10-27 NOTE — Telephone Encounter (Addendum)
 New transmission received from patient's device 10/26/23 so monitor has been reconnected. Next remote check 12/20/23.

## 2023-11-03 NOTE — Addendum Note (Signed)
 Addended by: Lott Rouleau A on: 11/03/2023 02:11 PM   Modules accepted: Orders

## 2023-11-03 NOTE — Progress Notes (Signed)
 Remote pacemaker transmission.

## 2023-11-11 DIAGNOSIS — E1165 Type 2 diabetes mellitus with hyperglycemia: Secondary | ICD-10-CM | POA: Diagnosis not present

## 2023-11-16 ENCOUNTER — Ambulatory Visit: Payer: 59 | Admitting: Cardiology

## 2023-11-16 ENCOUNTER — Encounter: Payer: Self-pay | Admitting: Cardiology

## 2023-12-11 DIAGNOSIS — E1165 Type 2 diabetes mellitus with hyperglycemia: Secondary | ICD-10-CM | POA: Diagnosis not present

## 2023-12-20 ENCOUNTER — Ambulatory Visit (INDEPENDENT_AMBULATORY_CARE_PROVIDER_SITE_OTHER): Payer: Self-pay

## 2023-12-20 DIAGNOSIS — I441 Atrioventricular block, second degree: Secondary | ICD-10-CM

## 2023-12-21 LAB — CUP PACEART REMOTE DEVICE CHECK
Battery Voltage: 40
Date Time Interrogation Session: 20250709081831
Implantable Lead Connection Status: 753985
Implantable Lead Connection Status: 753985
Implantable Lead Implant Date: 20180709
Implantable Lead Implant Date: 20180709
Implantable Lead Location: 753859
Implantable Lead Location: 753860
Implantable Lead Model: 5076
Implantable Lead Model: 5076
Implantable Pulse Generator Implant Date: 20180709
Pulse Gen Model: 407145
Pulse Gen Serial Number: 69106657

## 2023-12-25 ENCOUNTER — Ambulatory Visit: Payer: Self-pay | Admitting: Cardiovascular Disease

## 2024-01-11 DIAGNOSIS — E1165 Type 2 diabetes mellitus with hyperglycemia: Secondary | ICD-10-CM | POA: Diagnosis not present

## 2024-02-10 DIAGNOSIS — E1165 Type 2 diabetes mellitus with hyperglycemia: Secondary | ICD-10-CM | POA: Diagnosis not present

## 2024-03-12 DIAGNOSIS — E1165 Type 2 diabetes mellitus with hyperglycemia: Secondary | ICD-10-CM | POA: Diagnosis not present

## 2024-03-15 ENCOUNTER — Encounter: Payer: 59 | Admitting: Cardiovascular Disease

## 2024-03-20 ENCOUNTER — Ambulatory Visit (INDEPENDENT_AMBULATORY_CARE_PROVIDER_SITE_OTHER): Payer: Self-pay

## 2024-03-20 DIAGNOSIS — I441 Atrioventricular block, second degree: Secondary | ICD-10-CM | POA: Diagnosis not present

## 2024-03-21 ENCOUNTER — Ambulatory Visit: Attending: Cardiovascular Disease | Admitting: Cardiovascular Disease

## 2024-03-21 ENCOUNTER — Encounter: Payer: Self-pay | Admitting: Cardiovascular Disease

## 2024-03-21 VITALS — BP 126/74 | HR 60 | Ht 61.0 in | Wt 195.8 lb

## 2024-03-21 DIAGNOSIS — I25119 Atherosclerotic heart disease of native coronary artery with unspecified angina pectoris: Secondary | ICD-10-CM | POA: Diagnosis not present

## 2024-03-21 LAB — CUP PACEART REMOTE DEVICE CHECK
Battery Voltage: 40
Date Time Interrogation Session: 20251008091940
Implantable Lead Connection Status: 753985
Implantable Lead Connection Status: 753985
Implantable Lead Implant Date: 20180709
Implantable Lead Implant Date: 20180709
Implantable Lead Location: 753859
Implantable Lead Location: 753860
Implantable Lead Model: 5076
Implantable Lead Model: 5076
Implantable Pulse Generator Implant Date: 20180709
Pulse Gen Model: 407145
Pulse Gen Serial Number: 69106657

## 2024-03-21 NOTE — Patient Instructions (Addendum)
 Medication Instructions:   Continue all current medications.   Labwork:  none  Testing/Procedures:  none  Follow-Up:  2 years   Any Other Special Instructions Will Be Listed Below (If Applicable).   If you need a refill on your cardiac medications before your next appointment, please call your pharmacy.

## 2024-03-21 NOTE — Progress Notes (Signed)
 PCP: Rosamond Leta NOVAK, MD Primary Cardiologist: Dr Debera Primary EP:  Dr [name]  Kristen [name] is a 78 y.o. female who presents today for routine electrophysiology followup.  Since last being seen in our clinic, the patient reports doing very well.    She has periodic chest pain, relieved with single tablet of nitroglycerin .  Her use of nitroglycerin  has not changed.  Today, she denies symptoms of palpitations, chest pain, shortness of breath,  lower extremity edema, or dizziness.      Physical Exam: Vitals:   03/21/24 1010  BP: 126/74  Pulse: 60  SpO2: 93%  Weight: 195 lb 12.8 oz (88.8 kg)  Height: 5' 1 (1.549 m)     Gen: Appears comfortable, well-nourished CV: RRR, no dependent edema The device site is normal -- no tenderness, edema, drainage, redness, threatened erosion.  Pulm: breathing easily   Pacemaker interrogation- Biotronik programmed is not working, unfortunately. I reviewed the last remote report available.   TTE May 22nd 2023 EF 55 to 60%.  Assessment and Plan:  1. Symptomatic second degree heart block Normal pacemaker function on most recent report The programmer is malfunctioning, so we are unable to interrogate the device today No changes today   2. CAD S/p LAD PCI 2000 Stable angina. No change in NTG use.  3. HTN Stable No change required today  4. Obesity Body mass index is 37 kg/m. Lifestyle modification advised    Return in a year  Kristen FORBES Nancey, MD 03/21/2024 10:18 AM  '

## 2024-03-22 NOTE — Progress Notes (Signed)
 Remote PPM Transmission

## 2024-03-25 ENCOUNTER — Ambulatory Visit: Payer: Self-pay | Admitting: Cardiovascular Disease

## 2024-03-25 NOTE — Progress Notes (Signed)
 Remote PPM Transmission

## 2024-04-10 ENCOUNTER — Encounter: Payer: Self-pay | Admitting: Cardiology

## 2024-04-10 ENCOUNTER — Ambulatory Visit: Attending: Cardiology | Admitting: Cardiology

## 2024-04-10 VITALS — BP 104/68 | HR 73 | Ht 61.0 in | Wt 194.8 lb

## 2024-04-10 DIAGNOSIS — I25119 Atherosclerotic heart disease of native coronary artery with unspecified angina pectoris: Secondary | ICD-10-CM

## 2024-04-10 DIAGNOSIS — I441 Atrioventricular block, second degree: Secondary | ICD-10-CM

## 2024-04-10 DIAGNOSIS — I10 Essential (primary) hypertension: Secondary | ICD-10-CM

## 2024-04-10 DIAGNOSIS — E782 Mixed hyperlipidemia: Secondary | ICD-10-CM

## 2024-04-10 DIAGNOSIS — I442 Atrioventricular block, complete: Secondary | ICD-10-CM

## 2024-04-10 NOTE — Patient Instructions (Addendum)

## 2024-04-10 NOTE — Progress Notes (Signed)
    Cardiology Office Note  Date: 04/10/2024   ID: Kristen [name], DOB April 16, 1946, MRN [id number]  History of Present Illness: Kristen [name] is a 78 y.o. female last seen in November 2024.  She is here for a routine visit.  Reports 2 episodes of potential angina in the last year, resolved with single nitroglycerin  on each occasion.  NYHA class II dyspnea, no palpitations or syncope.  I went over her medications.  She is now taking Crestor 5 mg once weekly and Zetia  10 mg daily.  Lipids were not as well-controlled by last check with repeat lab work pending in December per PCP this year.  Blood pressure is well-controlled today.  Biotronik pacemaker in place with follow-up with Dr. Nancey.  She had a follow-up visit with him earlier in the month, I reviewed the note.  Device interrogation this month showed normal function.  I reviewed her ECG today which shows dual-chamber AV pacing.  Physical Exam: VS:  BP 104/68   Pulse 73   Ht 5' 1 (1.549 m)   Wt 194 lb 12.8 oz (88.4 kg)   SpO2 94%   BMI 36.81 kg/m , BMI Body mass index is 36.81 kg/m.  Wt Readings from Last 3 Encounters:  04/10/24 194 lb 12.8 oz (88.4 kg)  03/21/24 195 lb 12.8 oz (88.8 kg)  05/09/23 199 lb 12.8 oz (90.6 kg)    General: Patient appears comfortable at rest. HEENT: Conjunctiva and lids normal, . Neck: Supple, no elevated JVP or carotid bruits. Lungs: Clear to auscultation, nonlabored breathing at rest. Cardiac: Regular rate and rhythm, no S3, 1/6 systolic murmur. Extremities: No pitting edema.  ECG:  An ECG dated 03/21/2024 was personally reviewed today and demonstrated:  Dual-chamber AV pacing.  Labwork:  December 2024: Hemoglobin 13.8, platelets 243, BUN 18, creatinine 1.21, GFR 46, potassium 5.1, AST 19, ALT 20, cholesterol 187, triglycerides 238, HDL 40, LDL 106, TSH 1.06  Other Studies Reviewed Today:  No interval cardiac testing for review today.  Assessment and Plan:  1.  CAD status post DES to  the LAD in September 2020.  LVEF 55 to 60% by echocardiogram in May 2023.  No accelerating angina or increasing nitroglycerin  requirement.  Plan to continue medical therapy including aspirin  81 mg daily, Plavix  75 mg daily, Crestor 5 mg weekly, Zetia  10 mg daily, Jardiance  25 mg daily, and Imdur  30 mg daily.   2.  Symptomatic second-degree heart block status post Biotronik pacemaker with follow-up by Dr. Nancey.   3.  Mixed hyperlipidemia.  LDL up to 106 in December of last year.  Follow-up FLP with PCP this year.  She is taking Crestor 5 mg once weekly and Zetia  10 mg daily.  May be able to uptitrate Crestor further to get LDL back toward goal.   4.  Primary hypertension.  Blood pressure is well-controlled today.  Continue Norvasc  5 mg daily and lisinopril  2.5 mg daily.  Disposition:  Follow up 1 year.  Signed, Jayson JUDITHANN Sierras, M.D., F.A.C.C. [facility]

## 2024-05-13 NOTE — Progress Notes (Signed)
 Grady Lucci                                          MRN: [id number]   05/13/2024   The [facility] Quality Team Specialist reviewed this patient medical record for the purposes of chart review for care gap closure. The following were reviewed: chart review for care gap closure-kidney health evaluation for diabetes:eGFR  and uACR.    [facility] Quality Team

## 2024-06-19 ENCOUNTER — Ambulatory Visit

## 2024-06-19 DIAGNOSIS — I441 Atrioventricular block, second degree: Secondary | ICD-10-CM | POA: Diagnosis not present

## 2024-06-20 LAB — CUP PACEART REMOTE DEVICE CHECK
Battery Voltage: 40
Date Time Interrogation Session: 20260107094326
Implantable Lead Connection Status: 753985
Implantable Lead Connection Status: 753985
Implantable Lead Implant Date: 20180709
Implantable Lead Implant Date: 20180709
Implantable Lead Location: 753859
Implantable Lead Location: 753860
Implantable Lead Model: 5076
Implantable Lead Model: 5076
Implantable Pulse Generator Implant Date: 20180709
Pulse Gen Model: 407145
Pulse Gen Serial Number: 69106657

## 2024-06-22 ENCOUNTER — Ambulatory Visit: Payer: Self-pay | Admitting: Cardiovascular Disease

## 2024-06-24 NOTE — Progress Notes (Signed)
 Remote PPM Transmission

## 2024-09-18 ENCOUNTER — Encounter

## 2024-12-18 ENCOUNTER — Encounter

## 2025-03-19 ENCOUNTER — Encounter

## 2025-06-18 ENCOUNTER — Encounter

## 2025-09-17 ENCOUNTER — Encounter
